# Patient Record
Sex: Male | Born: 1950 | Race: White | Hispanic: No | Marital: Single | Smoking: Former smoker
Health system: Southern US, Community
[De-identification: ages and names within clinical notes are randomized; demographics above are authoritative.]

## PROBLEM LIST (undated history)

## (undated) DIAGNOSIS — J449 Chronic obstructive pulmonary disease, unspecified: Secondary | ICD-10-CM

## (undated) DIAGNOSIS — D499 Neoplasm of unspecified behavior of unspecified site: Secondary | ICD-10-CM

## (undated) DIAGNOSIS — F329 Major depressive disorder, single episode, unspecified: Secondary | ICD-10-CM

## (undated) DIAGNOSIS — J40 Bronchitis, not specified as acute or chronic: Secondary | ICD-10-CM

## (undated) DIAGNOSIS — F1021 Alcohol dependence, in remission: Secondary | ICD-10-CM

## (undated) DIAGNOSIS — K439 Ventral hernia without obstruction or gangrene: Secondary | ICD-10-CM

## (undated) DIAGNOSIS — F419 Anxiety disorder, unspecified: Secondary | ICD-10-CM

## (undated) DIAGNOSIS — Z902 Acquired absence of lung [part of]: Secondary | ICD-10-CM

## (undated) DIAGNOSIS — C801 Malignant (primary) neoplasm, unspecified: Secondary | ICD-10-CM

## (undated) DIAGNOSIS — H3530 Unspecified macular degeneration: Secondary | ICD-10-CM

## (undated) DIAGNOSIS — F32A Depression, unspecified: Secondary | ICD-10-CM

## (undated) DIAGNOSIS — Z9889 Other specified postprocedural states: Secondary | ICD-10-CM

## (undated) DIAGNOSIS — I10 Essential (primary) hypertension: Secondary | ICD-10-CM

## (undated) HISTORY — DX: Bronchitis, not specified as acute or chronic: J40

## (undated) HISTORY — DX: Anxiety disorder, unspecified: F41.9

## (undated) HISTORY — DX: Malignant (primary) neoplasm, unspecified: C80.1

## (undated) HISTORY — DX: Essential (primary) hypertension: I10

## (undated) HISTORY — DX: Acquired absence of lung (part of): Z90.2

## (undated) HISTORY — DX: Major depressive disorder, single episode, unspecified: F32.9

## (undated) HISTORY — DX: Other specified postprocedural states: Z98.890

## (undated) HISTORY — DX: Alcohol dependence, in remission: F10.21

## (undated) HISTORY — DX: Chronic obstructive pulmonary disease, unspecified: J44.9

## (undated) HISTORY — DX: Unspecified macular degeneration: H35.30

## (undated) HISTORY — DX: Ventral hernia without obstruction or gangrene: K43.9

## (undated) HISTORY — DX: Neoplasm of unspecified behavior of unspecified site: D49.9

## (undated) HISTORY — PX: LUNG REMOVAL, PARTIAL: SHX233

## (undated) HISTORY — PX: TONSILLECTOMY: SUR1361

## (undated) HISTORY — PX: NECK SURGERY: SHX720

## (undated) HISTORY — DX: Depression, unspecified: F32.A

---

## 1999-10-15 DIAGNOSIS — G8929 Other chronic pain: Secondary | ICD-10-CM | POA: Insufficient documentation

## 1999-10-15 DIAGNOSIS — F329 Major depressive disorder, single episode, unspecified: Secondary | ICD-10-CM | POA: Insufficient documentation

## 1999-10-15 DIAGNOSIS — I10 Essential (primary) hypertension: Secondary | ICD-10-CM | POA: Insufficient documentation

## 1999-10-15 DIAGNOSIS — F419 Anxiety disorder, unspecified: Secondary | ICD-10-CM | POA: Insufficient documentation

## 1999-10-15 DIAGNOSIS — F4310 Post-traumatic stress disorder, unspecified: Secondary | ICD-10-CM | POA: Insufficient documentation

## 1999-10-15 DIAGNOSIS — Z79891 Long term (current) use of opiate analgesic: Secondary | ICD-10-CM | POA: Insufficient documentation

## 1999-10-15 DIAGNOSIS — Z9981 Dependence on supplemental oxygen: Secondary | ICD-10-CM | POA: Insufficient documentation

## 2006-05-23 DIAGNOSIS — N529 Male erectile dysfunction, unspecified: Secondary | ICD-10-CM | POA: Insufficient documentation

## 2010-10-31 ENCOUNTER — Ambulatory Visit: Payer: Self-pay | Admitting: Pain Medicine

## 2010-11-20 ENCOUNTER — Ambulatory Visit: Payer: Self-pay | Admitting: Pain Medicine

## 2010-12-05 ENCOUNTER — Ambulatory Visit: Payer: Self-pay | Admitting: Pain Medicine

## 2010-12-13 ENCOUNTER — Other Ambulatory Visit: Payer: Self-pay | Admitting: Pain Medicine

## 2010-12-27 ENCOUNTER — Ambulatory Visit: Payer: Self-pay | Admitting: Pain Medicine

## 2011-01-28 ENCOUNTER — Ambulatory Visit: Payer: Self-pay | Admitting: Pain Medicine

## 2011-02-14 ENCOUNTER — Ambulatory Visit: Payer: Self-pay | Admitting: Pain Medicine

## 2011-02-21 ENCOUNTER — Ambulatory Visit: Payer: Self-pay | Admitting: Pain Medicine

## 2011-02-27 ENCOUNTER — Ambulatory Visit: Payer: Self-pay | Admitting: Pain Medicine

## 2011-02-28 ENCOUNTER — Ambulatory Visit: Payer: Self-pay | Admitting: Pain Medicine

## 2011-03-21 ENCOUNTER — Ambulatory Visit: Payer: Self-pay | Admitting: Pain Medicine

## 2011-04-08 ENCOUNTER — Ambulatory Visit: Payer: Self-pay | Admitting: Pain Medicine

## 2011-04-30 ENCOUNTER — Ambulatory Visit: Payer: Self-pay | Admitting: Pain Medicine

## 2011-05-22 DIAGNOSIS — I4891 Unspecified atrial fibrillation: Secondary | ICD-10-CM | POA: Insufficient documentation

## 2011-05-23 ENCOUNTER — Other Ambulatory Visit: Payer: Self-pay | Admitting: Pain Medicine

## 2011-05-27 ENCOUNTER — Ambulatory Visit: Payer: Self-pay | Admitting: Pain Medicine

## 2011-06-24 ENCOUNTER — Ambulatory Visit: Payer: Self-pay | Admitting: Pain Medicine

## 2011-07-04 ENCOUNTER — Ambulatory Visit: Payer: Self-pay | Admitting: Pain Medicine

## 2011-07-31 ENCOUNTER — Ambulatory Visit: Payer: Self-pay | Admitting: Pain Medicine

## 2011-09-11 ENCOUNTER — Ambulatory Visit: Payer: Self-pay | Admitting: Pain Medicine

## 2011-09-18 ENCOUNTER — Other Ambulatory Visit: Payer: Self-pay

## 2011-09-19 ENCOUNTER — Ambulatory Visit: Payer: Self-pay | Admitting: Pain Medicine

## 2011-10-16 ENCOUNTER — Ambulatory Visit: Payer: Self-pay | Admitting: Pain Medicine

## 2011-11-28 ENCOUNTER — Ambulatory Visit: Payer: Self-pay | Admitting: Pain Medicine

## 2011-12-16 ENCOUNTER — Ambulatory Visit: Payer: Self-pay | Admitting: Pain Medicine

## 2012-01-20 ENCOUNTER — Ambulatory Visit: Payer: Self-pay | Admitting: Pain Medicine

## 2012-01-30 ENCOUNTER — Ambulatory Visit: Payer: Self-pay | Admitting: Pain Medicine

## 2012-02-17 ENCOUNTER — Ambulatory Visit: Payer: Self-pay | Admitting: Pain Medicine

## 2012-03-17 ENCOUNTER — Ambulatory Visit: Payer: Self-pay | Admitting: Pain Medicine

## 2012-04-21 ENCOUNTER — Ambulatory Visit: Payer: Self-pay | Admitting: Pain Medicine

## 2012-06-17 ENCOUNTER — Ambulatory Visit: Payer: Self-pay | Admitting: Pain Medicine

## 2012-06-23 ENCOUNTER — Ambulatory Visit: Payer: Self-pay | Admitting: Pain Medicine

## 2012-06-25 ENCOUNTER — Ambulatory Visit: Payer: Self-pay | Admitting: Pain Medicine

## 2012-07-15 ENCOUNTER — Ambulatory Visit: Payer: Self-pay | Admitting: Pain Medicine

## 2012-07-30 ENCOUNTER — Ambulatory Visit: Payer: Self-pay | Admitting: Pain Medicine

## 2012-09-30 ENCOUNTER — Ambulatory Visit: Payer: Self-pay | Admitting: Pain Medicine

## 2012-10-22 ENCOUNTER — Ambulatory Visit: Payer: Self-pay | Admitting: Pain Medicine

## 2012-11-16 ENCOUNTER — Ambulatory Visit: Payer: Self-pay | Admitting: Pain Medicine

## 2012-11-19 ENCOUNTER — Ambulatory Visit: Payer: Self-pay | Admitting: Pain Medicine

## 2013-01-14 ENCOUNTER — Ambulatory Visit: Payer: Self-pay | Admitting: Pain Medicine

## 2013-02-12 ENCOUNTER — Other Ambulatory Visit: Payer: Self-pay | Admitting: Pain Medicine

## 2013-02-12 ENCOUNTER — Ambulatory Visit: Payer: Self-pay | Admitting: Pain Medicine

## 2013-02-12 LAB — MAGNESIUM: Magnesium: 1.7 mg/dL — ABNORMAL LOW

## 2013-02-18 ENCOUNTER — Ambulatory Visit: Payer: Self-pay | Admitting: Pain Medicine

## 2013-02-22 DIAGNOSIS — R0602 Shortness of breath: Secondary | ICD-10-CM | POA: Insufficient documentation

## 2013-02-22 DIAGNOSIS — F319 Bipolar disorder, unspecified: Secondary | ICD-10-CM | POA: Insufficient documentation

## 2013-02-22 DIAGNOSIS — E785 Hyperlipidemia, unspecified: Secondary | ICD-10-CM | POA: Insufficient documentation

## 2013-02-22 DIAGNOSIS — K5730 Diverticulosis of large intestine without perforation or abscess without bleeding: Secondary | ICD-10-CM | POA: Insufficient documentation

## 2013-02-22 DIAGNOSIS — F17200 Nicotine dependence, unspecified, uncomplicated: Secondary | ICD-10-CM | POA: Insufficient documentation

## 2013-02-22 DIAGNOSIS — G8912 Acute post-thoracotomy pain: Secondary | ICD-10-CM | POA: Insufficient documentation

## 2013-02-22 DIAGNOSIS — J441 Chronic obstructive pulmonary disease with (acute) exacerbation: Secondary | ICD-10-CM | POA: Insufficient documentation

## 2013-02-22 DIAGNOSIS — K219 Gastro-esophageal reflux disease without esophagitis: Secondary | ICD-10-CM | POA: Insufficient documentation

## 2013-02-22 DIAGNOSIS — I10 Essential (primary) hypertension: Secondary | ICD-10-CM | POA: Insufficient documentation

## 2013-03-12 ENCOUNTER — Ambulatory Visit: Payer: Self-pay | Admitting: Pain Medicine

## 2013-03-18 ENCOUNTER — Ambulatory Visit: Payer: Self-pay | Admitting: Pain Medicine

## 2013-04-09 ENCOUNTER — Ambulatory Visit: Payer: Self-pay | Admitting: Pain Medicine

## 2013-04-29 ENCOUNTER — Ambulatory Visit: Payer: Self-pay | Admitting: Pain Medicine

## 2013-05-17 ENCOUNTER — Ambulatory Visit: Payer: Self-pay | Admitting: Pain Medicine

## 2013-06-04 ENCOUNTER — Ambulatory Visit: Payer: Self-pay | Admitting: Pain Medicine

## 2013-06-10 ENCOUNTER — Ambulatory Visit: Payer: Self-pay | Admitting: Pain Medicine

## 2013-07-12 ENCOUNTER — Ambulatory Visit: Payer: Self-pay | Admitting: Pain Medicine

## 2013-07-15 ENCOUNTER — Ambulatory Visit: Payer: Self-pay | Admitting: Pain Medicine

## 2013-07-28 DIAGNOSIS — I70209 Unspecified atherosclerosis of native arteries of extremities, unspecified extremity: Secondary | ICD-10-CM | POA: Insufficient documentation

## 2013-07-28 DIAGNOSIS — M5030 Other cervical disc degeneration, unspecified cervical region: Secondary | ICD-10-CM | POA: Insufficient documentation

## 2013-08-05 ENCOUNTER — Ambulatory Visit: Payer: Self-pay | Admitting: Pain Medicine

## 2013-08-13 ENCOUNTER — Ambulatory Visit: Payer: Self-pay | Admitting: Pain Medicine

## 2013-08-19 ENCOUNTER — Ambulatory Visit: Payer: Self-pay | Admitting: Pain Medicine

## 2013-09-06 ENCOUNTER — Ambulatory Visit: Payer: Self-pay | Admitting: Pain Medicine

## 2013-10-22 ENCOUNTER — Ambulatory Visit: Payer: Self-pay | Admitting: Pain Medicine

## 2013-12-23 ENCOUNTER — Ambulatory Visit: Payer: Self-pay | Admitting: Pain Medicine

## 2013-12-29 ENCOUNTER — Other Ambulatory Visit: Payer: Self-pay | Admitting: Pain Medicine

## 2014-01-05 ENCOUNTER — Ambulatory Visit: Payer: Self-pay | Admitting: Pain Medicine

## 2014-01-20 ENCOUNTER — Ambulatory Visit: Payer: Self-pay | Admitting: Pain Medicine

## 2014-02-09 ENCOUNTER — Ambulatory Visit: Payer: Self-pay | Admitting: Pain Medicine

## 2014-02-25 ENCOUNTER — Ambulatory Visit: Payer: Self-pay | Admitting: Pain Medicine

## 2014-03-01 ENCOUNTER — Ambulatory Visit: Payer: Self-pay | Admitting: Pain Medicine

## 2014-03-03 ENCOUNTER — Ambulatory Visit: Payer: Self-pay | Admitting: Pain Medicine

## 2014-03-23 ENCOUNTER — Ambulatory Visit: Payer: Self-pay | Admitting: Pain Medicine

## 2014-04-29 DIAGNOSIS — G629 Polyneuropathy, unspecified: Secondary | ICD-10-CM | POA: Insufficient documentation

## 2014-05-06 ENCOUNTER — Ambulatory Visit: Payer: Self-pay | Admitting: Pain Medicine

## 2014-05-12 ENCOUNTER — Ambulatory Visit: Payer: Self-pay | Admitting: Pain Medicine

## 2014-05-30 ENCOUNTER — Ambulatory Visit: Payer: Self-pay | Admitting: Pain Medicine

## 2014-07-07 ENCOUNTER — Ambulatory Visit: Payer: Self-pay | Admitting: Pain Medicine

## 2014-07-28 ENCOUNTER — Ambulatory Visit: Payer: Self-pay | Admitting: Pain Medicine

## 2014-08-17 ENCOUNTER — Ambulatory Visit: Payer: Self-pay | Admitting: Pain Medicine

## 2015-01-25 DIAGNOSIS — C44211 Basal cell carcinoma of skin of unspecified ear and external auricular canal: Secondary | ICD-10-CM | POA: Insufficient documentation

## 2015-07-20 ENCOUNTER — Encounter: Payer: Self-pay | Admitting: Pain Medicine

## 2015-07-20 ENCOUNTER — Ambulatory Visit: Payer: Medicare Other | Attending: Pain Medicine | Admitting: Pain Medicine

## 2015-07-20 VITALS — BP 156/77 | HR 59 | Temp 97.6°F | Resp 16 | Ht 69.0 in | Wt 162.0 lb

## 2015-07-20 DIAGNOSIS — M47817 Spondylosis without myelopathy or radiculopathy, lumbosacral region: Secondary | ICD-10-CM

## 2015-07-20 DIAGNOSIS — M549 Dorsalgia, unspecified: Secondary | ICD-10-CM | POA: Diagnosis present

## 2015-07-20 DIAGNOSIS — M545 Low back pain: Secondary | ICD-10-CM | POA: Diagnosis not present

## 2015-07-20 DIAGNOSIS — M5489 Other dorsalgia: Secondary | ICD-10-CM | POA: Insufficient documentation

## 2015-07-20 DIAGNOSIS — M47816 Spondylosis without myelopathy or radiculopathy, lumbar region: Secondary | ICD-10-CM

## 2015-07-20 MED ORDER — TRIAMCINOLONE ACETONIDE 40 MG/ML IJ SUSP
INTRAMUSCULAR | Status: AC
  Filled 2015-07-20: qty 1

## 2015-07-20 MED ORDER — TRIAMCINOLONE ACETONIDE 40 MG/ML IJ SUSP
40.0000 mg | Freq: Once | INTRAMUSCULAR | Status: AC
  Administered 2015-07-20: 13:00:00

## 2015-07-20 MED ORDER — ROPIVACAINE HCL 2 MG/ML IJ SOLN
INTRAMUSCULAR | Status: AC
  Filled 2015-07-20: qty 10

## 2015-07-20 MED ORDER — MIDAZOLAM HCL 5 MG/5ML IJ SOLN: 5.0000 mg | Freq: Once | INTRAMUSCULAR | Status: DC

## 2015-07-20 MED ORDER — FENTANYL CITRATE (PF) 100 MCG/2ML IJ SOLN
INTRAMUSCULAR | Status: AC
  Filled 2015-07-20: qty 2

## 2015-07-20 MED ORDER — MIDAZOLAM HCL 5 MG/5ML IJ SOLN
INTRAMUSCULAR | Status: AC
Start: 2015-07-20 — End: 2015-07-21
  Filled 2015-07-20: qty 5

## 2015-07-20 MED ORDER — ROPIVACAINE HCL 2 MG/ML IJ SOLN
9.0000 mL | Freq: Once | INTRAMUSCULAR | Status: AC
  Administered 2015-07-20: 13:00:00

## 2015-07-20 MED ORDER — FENTANYL CITRATE (PF) 100 MCG/2ML IJ SOLN: 100.0000 ug | Freq: Once | INTRAMUSCULAR | Status: DC

## 2015-07-20 NOTE — Patient Instructions (Signed)
Pain Management Discharge Instructions  General Discharge Instructions :  If you need to reach your doctor call: Monday-Friday 8:00 am - 4:00 pm at [phone] or toll free [phone].  After clinic hours [phone] to have operator reach doctor.  Bring all of your medication bottles to all your appointments in the pain clinic.  To cancel or reschedule your appointment with Pain Management please remember to call 24 hours in advance to avoid a fee.  Refer to the educational materials which you have been given on: General Risks, I had my Procedure. Discharge Instructions, Post Sedation.  Post Procedure Instructions:  The drugs you were given will stay in your system until tomorrow, so for the next 24 hours you should not drive, make any legal decisions or drink any alcoholic beverages.  You may eat anything you prefer, but it is better to start with liquids then soups and crackers, and gradually work up to solid foods.  Please notify your doctor immediately if you have any unusual bleeding, trouble breathing or pain that is not related to your normal pain.  Depending on the type of procedure that was done, some parts of your body may feel week and/or numb.  This usually clears up by tonight or the next day.  Walk with the use of an assistive device or accompanied by an adult for the 24 hours.  You may use ice on the affected area for the first 24 hours.  Put ice in a Ziploc bag and cover with a towel and place against area 15 minutes on 15 minutes off.  You may switch to heat after 24 hours.

## 2015-07-20 NOTE — Progress Notes (Signed)
Safety precautions to be maintained throughout the outpatient stay will include: orient to surroundings, keep bed in low position, maintain call bell within reach at all times, provide assistance with transfer out of bed and ambulation.  Oxyocodone 32m 73/90

## 2015-07-21 ENCOUNTER — Telehealth: Payer: Self-pay | Admitting: *Deleted

## 2015-07-21 NOTE — Telephone Encounter (Signed)
Patient verbalizes no complications from procedure.

## 2015-07-23 NOTE — Progress Notes (Signed)
Patient's Name: Rick LIPKIN Sr. MRN: [id number] DOB: 10/11/1951 DOS: 07/20/2015  Primary Reason(s) for Visit: Interventional Pain Management Treatment. CC: Back Pain   Pre-Procedure Assessment: Mr. Feutz is a 64 y.o. year old, male patient, seen today for interventional treatment. He  does not have a problem list on file.. His primarily concern today is the Back Pain Verification of the correct person, correct site (including marking of site), and correct procedure were performed and confirmed by the patient. Today's Vitals   07/20/15 1303 07/20/15 1309 07/20/15 1319 07/20/15 1329  BP: 156/85 113/79 167/87 156/77  Pulse: 65 61 63 59  Temp:      TempSrc:      Resp: _0 Height:      Weight:      SpO2: 95% 100% 99% 99%  PainSc: 2      Calculated BMI: Body mass index is 23.91 kg/(m^2). Allergies: He has No Known Allergies.. Primary Diagnosis: Facet syndrome, lumbar [M54.5]  Procedure: Type: Diagnostic Medial Branch Facet Block Region: Lumbar Level: L2, L3, L4, L5, & S1 Medial Branch Level(s) Laterality: Right  Indications: Spondylosis, Lumbosacral Region Lumbosacral Spine Pain  Consent: Secured. Under the influence of no sedatives a written informed consent was obtained, after having provided information on the risks and possible complications. To fulfill our ethical and legal obligations, as recommended by the American Medical Association's Code of Ethics, we have provided information to the patient about our clinical impression; the nature and purpose of the treatment or procedure; the risks, benefits, and possible complications of the intervention; alternatives; the risk(s) and benefit(s) of the alternative treatment(s) or procedure(s); and the risk(s) and benefit(s) of doing nothing. The patient was provided information about the risks and possible complications associated with the procedure. In the case of spinal procedures these may include, but are not limited to,  failure to achieve desired goals, infection, bleeding, organ or nerve damage, allergic reactions, paralysis, and death. In addition, the patient was informed that Medicine is not an exact science; therefore, there is also the possibility of unforeseen risks and possible complications that may result in a catastrophic outcome. The patient indicated having understood very clearly. We have given the patient no guarantees and we have made no promises. Enough time was given to the patient to ask questions, all of which were answered to the patient's satisfaction.  Pre-Procedure Preparation: Safety Precautions: Allergies reviewed. Appropriate site, procedure, and patient were confirmed by following the Joint Commission's Universal Protocol (UP.01.01.01), in the form of a "Time Out". The patient was asked to confirm marked site and procedure, before commencing. The patient was asked about blood thinners, or active infections, both of which were denied. Patient was assessed for positional comfort and all pressure points were checked before starting procedure. Monitoring:  As per clinic protocol. Infection Control Precautions: Sterile technique used. Standard Universal Precautions were taken as recommended by the Department of [facility] for Disease Control and Prevention (CDC). Standard pre-surgical skin prep was conducted. Respiratory hygiene and cough etiquette was practiced. Hand hygiene observed. Safe injection practices and needle disposal techniques followed. SDV (single dose vial) medications used. Medications properly checked for expiration dates and contaminants. Personal protective equipment (PPE) used: Sterile double glove technique. Radiation resistant gloves. Sterile surgical gloves.  Anesthesia, Analgesia, Anxiolysis: Type: Moderate (Conscious) Sedation & Local Anesthesia. Meaningful verbal contact was maintained, with the patient at all times during the procedure. Local Anesthetic(s):  Lidocaine 1% IV Access: Secured Sedation: Meaningful verbal contact  was maintained at all times during the procedure. Indication(s):Analgesia.  Description of Procedure Process:  Time-out: "Time-out" completed before starting procedure, as per protocol. Position: Prone Target Area: For Lumbar Facet blocks, the target is the groove formed by the junction of the transverse process and superior articular process. For the L5 dorsal ramus, the target is the notch between superior articular process and sacral ala. For the S1 dorsal ramus, the target is the superior and lateral edge of the posterior S1 Sacral foramen. Approach: Paramedial approach. Area Prepped: Entire Posterior Lumbosacral Region Prepping solution: ChloraPrep (2% chlorhexidine gluconate and 70% isopropyl alcohol) Safety Precautions: Aspiration looking for blood return was conducted prior to all injections. At no point did we inject any substances, as a needle was being advanced. No attempts were made at seeking any paresthesias. Safe injection practices and needle disposal techniques used. Medications properly checked for expiration dates. SDV (single dose vial) medications used. Description of the Procedure: Protocol guidelines were followed. The patient was placed in position over the fluoroscopy table. The target area was identified and the area prepped in the usual manner. Skin desensitized using vapocoolant spray. Skin & deeper tissues infiltrated with local anesthetic. Appropriate amount of time allowed to pass for local anesthetics to take effect. The procedure needle was introduced through the skin, ipsilateral to the reported pain, and advanced to the target area. Employing the "Medial Branch Technique", the needles were advanced to the angle made by the superior and medial portion of the transverse process, and the lateral and inferior portion of the superior articulating process of the targeted vertebral bodies. This area is known  as "Burton's Eye" or the "Eye of the [place] Dog". A procedure needle was introduced through the skin, and this time advanced to the angle made by the superior and medial border of the sacral ala, and the lateral border of the S1 vertebral body. This last needle was later repositioned at the superior and lateral border of the posterior S1 foramen. Negative aspiration confirmed. Solution injected in intermittent fashion, asking for systemic symptoms every 0.5cc of injectate. The needles were then removed and the area cleansed, making sure to leave some of the prepping solution back to take advantage of its long term bactericidal properties. EBL: None Materials & Medications Used:  Needle(s) Used: 22g - 3.5" Spinal Needle(s) Medications Administered today: We administered triamcinolone acetonide, ropivacaine (PF) 2 mg/ml (0.2%), ropivacaine (PF) 2 mg/ml (0.2%), and triamcinolone acetonide.Please see chart orders for dosing details.  Imaging Guidance:  Type of Imaging Technique: Fluoroscopy Guidance (Spinal) Indication(s): Assistance in needle guidance and placement for procedures requiring needle placement in or near specific anatomical locations not easily accessible without such assistance. Exposure Time: Please see nurses notes. Contrast: None required. Fluoroscopic Guidance: I was personally present in the fluoroscopy suite, where the patient was placed in position for the procedure, over the fluoroscopy-compatible table. Fluoroscopy was manipulated, using "Tunnel Vision Technique", to obtain the best possible view of the target area, on the affected side. Parallax error was corrected before commencing the procedure. A "direction-depth-direction" technique was used to introduce the needle under continuous pulsed fluoroscopic guidance. Once the target was reached, antero-posterior, oblique, and lateral fluoroscopic projection views were taken to confirm needle placement in all planes. Permanently  recorded images stored by scanning into EMR. Interpretation: Intraoperative imaging interpretation by performing Physician. Adequate needle placement confirmed. Adequate needle placement confirmed in AP, lateral, & Oblique Views. No contrast injected.  Antibiotics:  Type:  Antibiotics Given (last 72 hours)  None    No prophylactic antibiotics indicated. Indication(s): No indications identified.  Post-operative Assessment:  Complications: No immediate post-treatment complications were observed. Post-Procedure Pain Score (NAS11): 2/10 Relevant Post-operative Information:  Disposition: Return to clinic for follow-up evaluation. The patient tolerated the entire procedure well. A repeat set of vitals were taken after the procedure and the patient was kept under observation following institutional policy, for this procedure. Post-procedural neurological assessment was performed, showing return to baseline, prior to discharge. The patient was discharged home, once institutional criteria were met. The patient was provided with post-procedure discharge instructions, including a section on how to identify potential problems. Should any problems arise concerning this procedure, the patient was given instructions to immediately contact us, at any time, without hesitation. In any case, we plan to contact the patient by telephone for a follow-up status report regarding this interventional procedure. Comments:  No additional relevant information.  Primary Care Physician: No primary care provider on file. Location: [facility] Note by: Weronika Birch A. Dossie Arbour, M.D, DABA, DABAPM, DABPM, DABIPP, FIPP  Disclaimer:  Medicine is not an exact science. The only guarantee in medicine is that nothing is guaranteed. It is important to note that the decision to proceed with this intervention was based on the information collected from the patient. The Data and conclusions were drawn from the  patient's questionnaire, the interview, and the physical examination. Because the information was provided in large part by the patient, it cannot be guaranteed that it has not been purposely or unconsciously manipulated. Every effort has been made to obtain as much relevant data as possible for this evaluation. It is important to note that the conclusions that lead to this procedure are derived in large part from the available data. Always take into account that the treatment will also be dependent on availability of resources and existing treatment guidelines, considered by other Pain Management Practitioners as being common knowledge and practice, at the time of the intervention. For Medico-Legal purposes, it is also important to point out that variation in procedural techniques and pharmacological choices are the acceptable norm. The indications, contraindications, technique, and results of the above procedure should only be interpreted and judged by a Board-Certified Interventional Pain Specialist with extensive familiarity and expertise in the same exact procedure and technique. Attempts at providing opinions without similar or greater experience and expertise than that of the treating physician will be considered as inappropriate and unethical, and shall result in a formal complaint to the state medical board and applicable specialty societies.

## 2015-08-03 ENCOUNTER — Ambulatory Visit: Payer: Medicare Other | Attending: Pain Medicine | Admitting: Pain Medicine

## 2015-08-03 ENCOUNTER — Encounter: Payer: Self-pay | Admitting: Pain Medicine

## 2015-08-03 VITALS — BP 140/64 | HR 63 | Temp 98.0°F | Resp 16 | Ht 69.0 in | Wt 162.0 lb

## 2015-08-03 DIAGNOSIS — F17210 Nicotine dependence, cigarettes, uncomplicated: Secondary | ICD-10-CM | POA: Insufficient documentation

## 2015-08-03 DIAGNOSIS — Z79899 Other long term (current) drug therapy: Secondary | ICD-10-CM

## 2015-08-03 DIAGNOSIS — I10 Essential (primary) hypertension: Secondary | ICD-10-CM | POA: Insufficient documentation

## 2015-08-03 DIAGNOSIS — Z720 Tobacco use: Secondary | ICD-10-CM

## 2015-08-03 DIAGNOSIS — M47892 Other spondylosis, cervical region: Secondary | ICD-10-CM | POA: Diagnosis not present

## 2015-08-03 DIAGNOSIS — Z902 Acquired absence of lung [part of]: Secondary | ICD-10-CM

## 2015-08-03 DIAGNOSIS — J449 Chronic obstructive pulmonary disease, unspecified: Secondary | ICD-10-CM | POA: Insufficient documentation

## 2015-08-03 DIAGNOSIS — F329 Major depressive disorder, single episode, unspecified: Secondary | ICD-10-CM | POA: Insufficient documentation

## 2015-08-03 DIAGNOSIS — G8929 Other chronic pain: Secondary | ICD-10-CM | POA: Insufficient documentation

## 2015-08-03 DIAGNOSIS — F419 Anxiety disorder, unspecified: Secondary | ICD-10-CM | POA: Diagnosis not present

## 2015-08-03 DIAGNOSIS — M47812 Spondylosis without myelopathy or radiculopathy, cervical region: Secondary | ICD-10-CM

## 2015-08-03 DIAGNOSIS — M545 Low back pain, unspecified: Secondary | ICD-10-CM

## 2015-08-03 DIAGNOSIS — Z5181 Encounter for therapeutic drug level monitoring: Secondary | ICD-10-CM

## 2015-08-03 DIAGNOSIS — M7918 Myalgia, other site: Secondary | ICD-10-CM

## 2015-08-03 DIAGNOSIS — M961 Postlaminectomy syndrome, not elsewhere classified: Secondary | ICD-10-CM

## 2015-08-03 DIAGNOSIS — F1190 Opioid use, unspecified, uncomplicated: Secondary | ICD-10-CM | POA: Insufficient documentation

## 2015-08-03 DIAGNOSIS — G894 Chronic pain syndrome: Secondary | ICD-10-CM | POA: Diagnosis not present

## 2015-08-03 DIAGNOSIS — M797 Fibromyalgia: Secondary | ICD-10-CM | POA: Diagnosis not present

## 2015-08-03 DIAGNOSIS — K5903 Drug induced constipation: Secondary | ICD-10-CM | POA: Diagnosis not present

## 2015-08-03 DIAGNOSIS — I70209 Unspecified atherosclerosis of native arteries of extremities, unspecified extremity: Secondary | ICD-10-CM

## 2015-08-03 DIAGNOSIS — F1120 Opioid dependence, uncomplicated: Secondary | ICD-10-CM

## 2015-08-03 DIAGNOSIS — M47816 Spondylosis without myelopathy or radiculopathy, lumbar region: Secondary | ICD-10-CM

## 2015-08-03 DIAGNOSIS — F1990 Other psychoactive substance use, unspecified, uncomplicated: Secondary | ICD-10-CM

## 2015-08-03 DIAGNOSIS — F1021 Alcohol dependence, in remission: Secondary | ICD-10-CM

## 2015-08-03 DIAGNOSIS — Z9889 Other specified postprocedural states: Secondary | ICD-10-CM

## 2015-08-03 DIAGNOSIS — T402X5A Adverse effect of other opioids, initial encounter: Secondary | ICD-10-CM

## 2015-08-03 DIAGNOSIS — Z79891 Long term (current) use of opiate analgesic: Secondary | ICD-10-CM

## 2015-08-03 DIAGNOSIS — M549 Dorsalgia, unspecified: Secondary | ICD-10-CM | POA: Diagnosis present

## 2015-08-03 MED ORDER — PREGABALIN 150 MG PO CAPS: 150.0000 mg | ORAL_CAPSULE | Freq: Two times a day (BID) | ORAL | Status: DC

## 2015-08-03 MED ORDER — MAGNESIUM OXIDE 400 MG PO TABS: 400.0000 mg | ORAL_TABLET | Freq: Every day | ORAL | Status: DC

## 2015-08-03 MED ORDER — DOCUSATE SODIUM 100 MG PO CAPS: 100.0000 mg | ORAL_CAPSULE | Freq: Every day | ORAL | Status: DC

## 2015-08-03 MED ORDER — METHOCARBAMOL 750 MG PO TABS: 750.0000 mg | ORAL_TABLET | Freq: Three times a day (TID) | ORAL | Status: DC | PRN

## 2015-08-03 MED ORDER — IBUPROFEN 800 MG PO TABS: 800.0000 mg | ORAL_TABLET | Freq: Three times a day (TID) | ORAL | Status: DC | PRN

## 2015-08-03 MED ORDER — OXYCODONE-ACETAMINOPHEN 5-325 MG PO TABS: 1.0000 | ORAL_TABLET | Freq: Three times a day (TID) | ORAL | Status: DC | PRN

## 2015-08-03 NOTE — Progress Notes (Signed)
Patient's Name: Rick Carter. MRN: [id number] DOB: 1951-10-12 DOS: 08/03/2015  Primary Reason(s) for Visit: Encounter for Medication Management. CC: Back Pain; Leg Pain; and Arm Pain   HPI:   Rick Carter is a 64 y.o. year old, male patient, who returns today as an established patient. 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[place]); Atrial fibrillation ([place]); Bipolar affective disorder ([place]); Chronic obstructive pulmonary disease ([place]); Degeneration of intervertebral disc of cervical region; Colon, diverticulosis; Acid reflux; Essential (primary) hypertension; HLD (hyperlipidemia); Breath shortness; Compulsive tobacco user syndrome; Diverticular disease of large intestine; Post-thoracotomy pain syndrome; Encounter for therapeutic drug level monitoring; Long term current use of opiate analgesic; Long term prescription opiate use; Uncomplicated opioid dependence ([place]); Opiate use; Fibromyalgia; Chronic pain syndrome; Chronic pain; Lumbar facet syndrome (R>L); History of alcoholism ([place]); Chronic low back pain; Lumbar spondylosis; Cervical spondylosis; Failed cervical surgery syndrome; Nicotine dependence; Tobacco abuse; Substance Use Disorder Risk: High; History of right-sided partial pneumonectomy; and Atherosclerotic peripheral vascular disease ([place]) on his problem list.. His primarily concern today is the Back Pain; Leg Pain; and Arm Pain   Rick Carter returns today after having had a right-sided palliative lumbar facet block under fluoroscopic guidance. He is doing well and he refers 80% relief of his low back pain. Today we will take over his Lyrica medication as well.  Pharmacotherapy Review: Side-effects or Adverse reactions: None reported. Effectiveness: Described as relatively effective, allowing for increase in activities of daily living (ADL). Onset of action: Within expected pharmacological parameters. Duration of action: Within normal limits for medication. Peak effect:  Timing and results are as within normal expected parameters. [place] PMP: Compliant with practice rules and regulations. DST: Compliant with practice rules and regulations. Lab work: No new labs ordered by our practice. Treatment compliance: Compliant. Substance Use Disorder (SUD) Risk Level: Low Planned course of action: Continue therapy as is.  Post-Procedure Assessment: Right lumbar facet block. Side-effects or Adverse reactions: No significant issues reported. Sedation: No sedation used during procedure. Ultra-Short Term Relief (Initial 30-60 minutes after procedure):  100% Short-Term Relief (Initial 6 hours after procedure):  100% Long-Term Relief (Initial 2 weeks after procedure):  60% Current Relief (Now):  80% Interpretation of Results: Non-physiological response to therapy. Ultra-short term relief is a normal physiological response to analgesics and anesthetics provided during the procedure. Short-term relief confirms injected site as etiology of pain. Long term relief is possibly due to sympathetic blockade, or the effects of steroids, if administered during procedure. Persistent relief would suggest effective anti-inflammatory effects from steroids. No short or long term benefit would suggest etiology of pain to be in a different location. No long term benefit would suggest etiology to be non-inflammatory, possibly compressive.  Allergies: Rick Carter has No Known Allergies.  Meds: The patient has a current medication list which includes the following prescription(s): aclidinium bromide, albuterol sulfate, aspirin ec, cholecalciferol, diphenhydramine, docusate sodium, fluticasone-salmeterol, hydrochlorothiazide, ibuprofen, magnesium oxide, metformin, methocarbamol, oxycodone-acetaminophen, pantoprazole, phenyleph-cpm-dm-apap, pregabalin, and trazodone. Requested Prescriptions   Signed Prescriptions Disp Refills  . docusate sodium (COLACE) 100 MG capsule 30 capsule 1    Sig: Take 1  capsule (100 mg total) by mouth daily.  Marland Kitchen ibuprofen (ADVIL,MOTRIN) 800 MG tablet 90 tablet 1    Sig: Take 1 tablet (800 mg total) by mouth every 8 (eight) hours as needed for moderate pain.  . magnesium oxide (MAG-OX) 400 MG tablet 30 tablet 1    Sig: Take 1 tablet (400 mg total) by mouth daily.  Marland Kitchen  methocarbamol (ROBAXIN) 750 MG tablet 90 tablet 1    Sig: Take 1 tablet (750 mg total) by mouth every 8 (eight) hours as needed for muscle spasms.  Marland Kitchen oxyCODONE-acetaminophen (PERCOCET/ROXICET) 5-325 MG tablet 90 tablet 0    Sig: Take 1 tablet by mouth every 8 (eight) hours as needed for severe pain.  . pregabalin (LYRICA) 150 MG capsule 60 capsule 1    Sig: Take 1 capsule (150 mg total) by mouth 2 (two) times daily.    ROS: Constitutional: Afebrile, no chills, well hydrated and well nourished Gastrointestinal: negative Musculoskeletal:negative Neurological: negative Behavioral/Psych: negative  PFSH: Medical:  Rick Carter  has a past medical history of Depression; Anxiety; COPD (chronic obstructive pulmonary disease) ([place]); Hypertension; History of alcoholism ([place]) (08/08/2015); and History of pneumonectomy (08/08/2015). Family: family history includes Alcohol abuse in his father; Kidney disease in his mother. Surgical:  has past surgical history that includes Tonsillectomy; Lung removal, partial; and Neck surgery. Tobacco:  reports that he has been smoking Cigarettes.  He has a 51 pack-year smoking history. He does not have any smokeless tobacco history on file. Alcohol:  reports that he does not drink alcohol. Drug:  has no drug history on file.  Physical Exam: Vitals:  Today's Vitals   08/03/15 1029 08/03/15 1030  BP: 140/64   Pulse: 63   Temp: 98 F (36.7 C)   TempSrc: Oral   Resp: 16   Height: _0  (1.753 m)   Weight: 162 lb (73.483 kg)   SpO2: 99%   PainSc: 4  4   PainLoc: Back   Calculated BMI: Body mass index is 23.91 kg/(m^2). General appearance: alert, cooperative,  appears stated age and mild distress Eyes: conjunctivae/corneas clear. PERRL, EOM's intact. Fundi benign. Lungs: No evidence respiratory distress, no audible rales or ronchi and no use of accessory muscles of respiration Neck: no adenopathy, no carotid bruit, no JVD, supple, symmetrical, trachea midline and thyroid not enlarged, symmetric, no tenderness/mass/nodules Back: symmetric, no curvature. ROM normal. No CVA tenderness. Extremities: extremities normal, atraumatic, no cyanosis or edema Pulses: 2+ and symmetric Skin: Skin color, texture, turgor normal. No rashes or lesions Neurologic: Gait: Antalgic    Assessment: Encounter Diagnosis:  Primary Diagnosis: Chronic pain [G89.29]  Plan: Baruc was seen today for back pain, leg pain and arm pain.  Diagnoses and all orders for this visit:  Chronic pain -     methocarbamol (ROBAXIN) 750 MG tablet; Take 1 tablet (750 mg total) by mouth every 8 (eight) hours as needed for muscle spasms. -     oxyCODONE-acetaminophen (PERCOCET/ROXICET) 5-325 MG tablet; Take 1 tablet by mouth every 8 (eight) hours as needed for severe pain.  Facet syndrome, lumbar  Chronic pain syndrome  Fibromyalgia -     pregabalin (LYRICA) 150 MG capsule; Take 1 capsule (150 mg total) by mouth 2 (two) times daily.  Opiate use  Uncomplicated opioid dependence ([place])  Long term prescription opiate use  Long term current use of opiate analgesic  Encounter for therapeutic drug level monitoring  Diffuse myofascial pain syndrome  Chronic low back pain  Therapeutic opioid-induced constipation ([facility])  History of alcoholism ([facility])  Cervical spondylosis  Failed cervical surgery syndrome  Tobacco abuse  Substance Use Disorder Risk: High  History of right-sided partial pneumonectomy  Atherosclerotic peripheral vascular disease ([place])  Other orders -     docusate sodium (COLACE) 100 MG capsule; Take 1 capsule (100 mg total) by mouth daily. -     ibuprofen  (ADVIL,MOTRIN)  800 MG tablet; Take 1 tablet (800 mg total) by mouth every 8 (eight) hours as needed for moderate pain. -     magnesium oxide (MAG-OX) 400 MG tablet; Take 1 tablet (400 mg total) by mouth daily.     There are no Patient Instructions on file for this visit. Medications discontinued today:  Medications Discontinued During This Encounter  Medication Reason  . fentaNYL (SUBLIMAZE) injection 100 mcg   . midazolam (VERSED) 5 MG/5ML injection 5 mg   . docusate sodium (COLACE) 100 MG capsule Reorder  . ibuprofen (ADVIL,MOTRIN) 800 MG tablet Reorder  . magnesium oxide (MAG-OX) 400 MG tablet Reorder  . methocarbamol (ROBAXIN) 750 MG tablet Reorder  . oxyCODONE-acetaminophen (PERCOCET/ROXICET) 5-325 MG tablet Reorder  . pregabalin (LYRICA) 150 MG capsule Reorder   Medications administered today:  Mr. Blasdell had no medications administered during this visit.  Primary Care Physician: No primary care provider on file. Location: [facility] Note by: Neil Errickson A. Dossie Arbour, M.D, DABA, DABAPM, DABPM, DABIPP, FIPP

## 2015-08-03 NOTE — Progress Notes (Signed)
Safety precautions to be maintained throughout the outpatient stay will include: orient to surroundings, keep bed in low position, maintain call bell within reach at all times, provide assistance with transfer out of bed and ambulation.  Oxycodone 5/353m  Count 32 tablets

## 2015-08-08 ENCOUNTER — Encounter: Payer: Self-pay | Admitting: Pain Medicine

## 2015-08-08 DIAGNOSIS — F1990 Other psychoactive substance use, unspecified, uncomplicated: Secondary | ICD-10-CM | POA: Insufficient documentation

## 2015-08-08 DIAGNOSIS — F1021 Alcohol dependence, in remission: Secondary | ICD-10-CM

## 2015-08-08 DIAGNOSIS — Z902 Acquired absence of lung [part of]: Secondary | ICD-10-CM

## 2015-08-08 DIAGNOSIS — F17200 Nicotine dependence, unspecified, uncomplicated: Secondary | ICD-10-CM | POA: Insufficient documentation

## 2015-08-08 DIAGNOSIS — M5441 Lumbago with sciatica, right side: Secondary | ICD-10-CM

## 2015-08-08 DIAGNOSIS — M47816 Spondylosis without myelopathy or radiculopathy, lumbar region: Secondary | ICD-10-CM | POA: Insufficient documentation

## 2015-08-08 DIAGNOSIS — M47812 Spondylosis without myelopathy or radiculopathy, cervical region: Secondary | ICD-10-CM | POA: Insufficient documentation

## 2015-08-08 DIAGNOSIS — M961 Postlaminectomy syndrome, not elsewhere classified: Secondary | ICD-10-CM | POA: Insufficient documentation

## 2015-08-08 DIAGNOSIS — I70209 Unspecified atherosclerosis of native arteries of extremities, unspecified extremity: Secondary | ICD-10-CM | POA: Insufficient documentation

## 2015-08-08 DIAGNOSIS — Z9889 Other specified postprocedural states: Secondary | ICD-10-CM

## 2015-08-08 DIAGNOSIS — G8929 Other chronic pain: Secondary | ICD-10-CM | POA: Insufficient documentation

## 2015-08-08 DIAGNOSIS — Z720 Tobacco use: Secondary | ICD-10-CM | POA: Insufficient documentation

## 2015-08-08 HISTORY — DX: Acquired absence of lung (part of): Z90.2

## 2015-08-08 HISTORY — DX: Other specified postprocedural states: Z98.890

## 2015-08-08 HISTORY — DX: Alcohol dependence, in remission: F10.21

## 2015-08-31 ENCOUNTER — Other Ambulatory Visit: Payer: Self-pay | Admitting: Pain Medicine

## 2015-09-12 ENCOUNTER — Encounter: Payer: Self-pay | Admitting: Pain Medicine

## 2015-09-12 ENCOUNTER — Ambulatory Visit: Payer: Medicare Other | Attending: Pain Medicine | Admitting: Pain Medicine

## 2015-09-12 VITALS — BP 156/87 | HR 64 | Temp 98.0°F | Resp 20 | Ht 69.0 in | Wt 166.0 lb

## 2015-09-12 DIAGNOSIS — M47812 Spondylosis without myelopathy or radiculopathy, cervical region: Secondary | ICD-10-CM

## 2015-09-12 DIAGNOSIS — J449 Chronic obstructive pulmonary disease, unspecified: Secondary | ICD-10-CM | POA: Insufficient documentation

## 2015-09-12 DIAGNOSIS — I10 Essential (primary) hypertension: Secondary | ICD-10-CM | POA: Insufficient documentation

## 2015-09-12 DIAGNOSIS — E785 Hyperlipidemia, unspecified: Secondary | ICD-10-CM | POA: Insufficient documentation

## 2015-09-12 DIAGNOSIS — F319 Bipolar disorder, unspecified: Secondary | ICD-10-CM | POA: Diagnosis not present

## 2015-09-12 DIAGNOSIS — K219 Gastro-esophageal reflux disease without esophagitis: Secondary | ICD-10-CM | POA: Diagnosis not present

## 2015-09-12 DIAGNOSIS — M961 Postlaminectomy syndrome, not elsewhere classified: Secondary | ICD-10-CM

## 2015-09-12 DIAGNOSIS — F17200 Nicotine dependence, unspecified, uncomplicated: Secondary | ICD-10-CM | POA: Diagnosis not present

## 2015-09-12 DIAGNOSIS — M5030 Other cervical disc degeneration, unspecified cervical region: Secondary | ICD-10-CM | POA: Diagnosis not present

## 2015-09-12 DIAGNOSIS — K5790 Diverticulosis of intestine, part unspecified, without perforation or abscess without bleeding: Secondary | ICD-10-CM | POA: Insufficient documentation

## 2015-09-12 DIAGNOSIS — I70209 Unspecified atherosclerosis of native arteries of extremities, unspecified extremity: Secondary | ICD-10-CM | POA: Diagnosis not present

## 2015-09-12 DIAGNOSIS — I4891 Unspecified atrial fibrillation: Secondary | ICD-10-CM | POA: Insufficient documentation

## 2015-09-12 DIAGNOSIS — M47816 Spondylosis without myelopathy or radiculopathy, lumbar region: Secondary | ICD-10-CM | POA: Insufficient documentation

## 2015-09-12 DIAGNOSIS — M797 Fibromyalgia: Secondary | ICD-10-CM | POA: Diagnosis not present

## 2015-09-12 DIAGNOSIS — M542 Cervicalgia: Secondary | ICD-10-CM | POA: Diagnosis present

## 2015-09-12 MED ORDER — SODIUM CHLORIDE 0.9 % IJ SOLN
INTRAMUSCULAR | Status: AC
  Administered 2015-09-12: 11:00:00
  Filled 2015-09-12: qty 10

## 2015-09-12 MED ORDER — ROPIVACAINE HCL 2 MG/ML IJ SOLN
INTRAMUSCULAR | Status: AC
  Administered 2015-09-12: 11:00:00
  Filled 2015-09-12: qty 10

## 2015-09-12 MED ORDER — LIDOCAINE HCL (PF) 1 % IJ SOLN
INTRAMUSCULAR | Status: AC
  Administered 2015-09-12: 11:00:00
  Filled 2015-09-12: qty 5

## 2015-09-12 MED ORDER — DEXAMETHASONE SODIUM PHOSPHATE 10 MG/ML IJ SOLN
10.0000 mg | Freq: Once | INTRAMUSCULAR | Status: DC
Start: 2015-09-12 — End: 2015-10-02

## 2015-09-12 MED ORDER — DEXAMETHASONE SODIUM PHOSPHATE 10 MG/ML IJ SOLN
INTRAMUSCULAR | Status: AC
  Administered 2015-09-12: 11:00:00
  Filled 2015-09-12: qty 1

## 2015-09-12 MED ORDER — LIDOCAINE HCL (PF) 1 % IJ SOLN: 10.0000 mL | Freq: Once | INTRAMUSCULAR | Status: DC

## 2015-09-12 MED ORDER — SODIUM CHLORIDE 0.9 % IJ SOLN: 1.0000 mL | Freq: Once | INTRAMUSCULAR | Status: DC

## 2015-09-12 MED ORDER — ROPIVACAINE HCL 2 MG/ML IJ SOLN: 1.0000 mL | Freq: Once | INTRAMUSCULAR | Status: DC

## 2015-09-12 NOTE — Patient Instructions (Signed)
Smoking Cessation, Tips for Success If you are ready to quit smoking, congratulations! You have chosen to help yourself be healthier. Cigarettes bring nicotine, tar, carbon monoxide, and other irritants into your body. Your lungs, heart, and blood vessels will be able to work better without these poisons. There are many different ways to quit smoking. Nicotine gum, nicotine patches, a nicotine inhaler, or nicotine nasal spray can help with physical craving. Hypnosis, support groups, and medicines help break the habit of smoking. WHAT THINGS CAN I DO TO MAKE QUITTING EASIER?  Here are some tips to help you quit for good:  Pick a date when you will quit smoking completely. Tell all of your friends and family about your plan to quit on that date.  Do not try to slowly cut down on the number of cigarettes you are smoking. Pick a quit date and quit smoking completely starting on that day.  Throw away all cigarettes.   Clean and remove all ashtrays from your home, work, and car.  On a card, write down your reasons for quitting. Carry the card with you and read it when you get the urge to smoke.  Cleanse your body of nicotine. Drink enough water and fluids to keep your urine clear or pale yellow. Do this after quitting to flush the nicotine from your body.  Learn to predict your moods. Do not let a bad situation be your excuse to have a cigarette. Some situations in your life might tempt you into wanting a cigarette.  Never have "just one" cigarette. It leads to wanting another and another. Remind yourself of your decision to quit.  Change habits associated with smoking. If you smoked while driving or when feeling stressed, try other activities to replace smoking. Stand up when drinking your coffee. Brush your teeth after eating. Sit in a different chair when you read the paper. Avoid alcohol while trying to quit, and try to drink fewer caffeinated beverages. Alcohol and caffeine may urge you to  smoke.  Avoid foods and drinks that can trigger a desire to smoke, such as sugary or spicy foods and alcohol.  Ask people who smoke not to smoke around you.  Have something planned to do right after eating or having a cup of coffee. For example, plan to take a walk or exercise.  Try a relaxation exercise to calm you down and decrease your stress. Remember, you may be tense and nervous for the first 2 weeks after you quit, but this will pass.  Find new activities to keep your hands busy. Play with a pen, coin, or rubber band. Doodle or draw things on paper.  Brush your teeth right after eating. This will help cut down on the craving for the taste of tobacco after meals. You can also try mouthwash.   Use oral substitutes in place of cigarettes. Try using lemon drops, carrots, cinnamon sticks, or chewing gum. Keep them handy so they are available when you have the urge to smoke.  When you have the urge to smoke, try deep breathing.  Designate your home as a nonsmoking area.  If you are a heavy smoker, ask your health care provider about a prescription for nicotine chewing gum. It can ease your withdrawal from nicotine.  Reward yourself. Set aside the cigarette money you save and buy yourself something nice.  Look for support from others. Join a support group or smoking cessation program. Ask someone at home or at work to help you with your plan   to quit smoking.  Always ask yourself, "Do I need this cigarette or is this just a reflex?" Tell yourself, "Today, I choose not to smoke," or "I do not want to smoke." You are reminding yourself of your decision to quit.  Do not replace cigarette smoking with electronic cigarettes (commonly called e-cigarettes). The safety of e-cigarettes is unknown, and some may contain harmful chemicals.  If you relapse, do not give up! Plan ahead and think about what you will do the next time you get the urge to smoke. HOW WILL I FEEL WHEN I QUIT SMOKING? You  may have symptoms of withdrawal because your body is used to nicotine (the addictive substance in cigarettes). You may crave cigarettes, be irritable, feel very hungry, cough often, get headaches, or have difficulty concentrating. The withdrawal symptoms are only temporary. They are strongest when you first quit but will go away within 10-14 days. When withdrawal symptoms occur, stay in control. Think about your reasons for quitting. Remind yourself that these are signs that your body is healing and getting used to being without cigarettes. Remember that withdrawal symptoms are easier to treat than the major diseases that smoking can cause.  Even after the withdrawal is over, expect periodic urges to smoke. However, these cravings are generally short lived and will go away whether you smoke or not. Do not smoke! WHAT RESOURCES ARE AVAILABLE TO HELP ME QUIT SMOKING? Your health care provider can direct you to community resources or hospitals for support, which may include:  Group support.  Education.  Hypnosis.  Therapy.   This information is not intended to replace advice given to you by your health care provider. Make sure you discuss any questions you have with your health care provider.   Document Released: 06/28/2004 Document Revised: 10/21/2014 Document Reviewed: 03/18/2013 Elsevier Interactive Patient Education [employer].

## 2015-09-12 NOTE — Progress Notes (Signed)
Safety precautions to be maintained throughout the outpatient stay will include: orient to surroundings, keep bed in low position, maintain call bell within reach at all times, provide assistance with transfer out of bed and ambulation.

## 2015-09-12 NOTE — Progress Notes (Signed)
Patient's Name: Rick KRETSCHMER Sr. MRN: [id number] DOB: 10-Jul-1951 DOS: 09/12/2015  Primary Reason(s) for Visit: Interventional Pain Management Treatment. CC: Neck Pain   Pre-Procedure Assessment: Rick Carter is a 64 y.o. year old, male patient, seen today for interventional treatment. 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[place]); Atrial fibrillation ([place]); Bipolar affective disorder ([place]); Chronic obstructive pulmonary disease ([place]); Degeneration of intervertebral disc of cervical region; Colon, diverticulosis; Acid reflux; Essential (primary) hypertension; HLD (hyperlipidemia); Breath shortness; Compulsive tobacco user syndrome; Diverticular disease of large intestine; Post-thoracotomy pain syndrome; Encounter for therapeutic drug level monitoring; Long term current use of opiate analgesic; Long term prescription opiate use; Uncomplicated opioid dependence ([place]); Opiate use; Fibromyalgia; Chronic pain syndrome; Chronic pain; Lumbar facet syndrome (R>L); History of alcoholism ([place]); Chronic low back pain; Lumbar spondylosis; Cervical spondylosis; Failed cervical surgery syndrome; Nicotine dependence; Tobacco abuse; Substance Use Disorder Risk: High; History of right-sided partial pneumonectomy; and Atherosclerotic peripheral vascular disease ([place]) on his problem list.. His primarily concern today is the Neck Pain   Verification of the correct person, correct site (including marking of site), and correct procedure were performed and confirmed by the patient.  The patient indicates that his trigger finger is returning and he would like it injected again. Several months ago we injected it and it went away for several months. He would like to try this again when he returns for the follow-up evaluation from this procedure.  Today's Vitals   09/12/15 1035 09/12/15 1045 09/12/15 1050 09/12/15 1055  BP: 151/77 156/82 157/84 156/87  Pulse: 68 51 62 64  Temp:      Resp: _0 Height:       Weight:      SpO2: 98% 94% 98% 98%  PainSc:    0-No pain  PainLoc:      Calculated BMI: Body mass index is 24.5 kg/(m^2). Allergies: He has No Known Allergies.. Primary Diagnosis: Degeneration of intervertebral disc of cervical region [M50.30]  Procedure: Type: Diagnostic Inter-Laminar Epidural Steroid Injection Region: Posterior Cervical Level: C7-T1  Laterality: Left-Sided Paramedial  Indications: Spondylosis, Cervical Region  Consent: Secured. Under the influence of no sedatives a written informed consent was obtained, after having provided information on the risks and possible complications. To fulfill our ethical and legal obligations, as recommended by the American Medical Association's Code of Ethics, we have provided information to the patient about our clinical impression; the nature and purpose of the treatment or procedure; the risks, benefits, and possible complications of the intervention; alternatives; the risk(s) and benefit(s) of the alternative treatment(s) or procedure(s); and the risk(s) and benefit(s) of doing nothing. The patient was provided information about the risks and possible complications associated with the procedure. In the case of spinal procedures these may include, but are not limited to, failure to achieve desired goals, infection, bleeding, organ or nerve damage, allergic reactions, paralysis, and death. In addition, the patient was informed that Medicine is not an exact science; therefore, there is also the possibility of unforeseen risks and possible complications that may result in a catastrophic outcome. The patient indicated having understood very clearly. We have given the patient no guarantees and we have made no promises. Enough time was given to the patient to ask questions, all of which were answered to the patient's satisfaction.  Pre-Procedure Preparation: Safety Precautions: Allergies reviewed. Appropriate site, procedure, and patient were  confirmed by following the Joint Commission's Universal Protocol (UP.01.01.01), in the form of a "Time Out". The patient  was asked to confirm marked site and procedure, before commencing. The patient was asked about blood thinners, or active infections, both of which were denied. Patient was assessed for positional comfort and all pressure points were checked before starting procedure. Monitoring:  As per clinic protocol. Infection Control Precautions: Sterile technique used. Standard Universal Precautions were taken as recommended by the Department of [facility] for Disease Control and Prevention (CDC). Standard pre-surgical skin prep was conducted. Respiratory hygiene and cough etiquette was practiced. Hand hygiene observed. Safe injection practices and needle disposal techniques followed. SDV (single dose vial) medications used. Medications properly checked for expiration dates and contaminants. Personal protective equipment (PPE) used: Surgical mask. Sterile double glove technique. Radiation resistant gloves. Sterile surgical gloves.  Anesthesia, Analgesia, Anxiolysis: Type: Local Anesthesia Local Anesthetic: Lidocaine 1% Route: Subcutaneous IV Access: Declined.", Sedation: Declined.  Indication(s):Not applicable.  Description of Procedure Process:  Time-out: "Time-out" completed before starting procedure, as per protocol. Position: Prone with head of the table was raised to facilitate breathing. Target Area: For Epidural Steroid injections the target is the interlaminar space, initially targeting the lower border of the superior vertebral body lamina. Approach: Paramedial approach. Area Prepped: Entire PosteriorCervical Region Prepping solution: ChloraPrep (2% chlorhexidine gluconate and 70% isopropyl alcohol) Safety Precautions: Aspiration looking for blood return was conducted prior to all injections. At no point did we inject any substances, as a needle was being advanced. No  attempts were made at seeking any paresthesias. Safe injection practices and needle disposal techniques used. Medications properly checked for expiration dates. SDV (single dose vial) medications used. Description of the Procedure: Protocol guidelines were followed. The procedure needle was introduced through the skin, ipsilateral to the reported pain, and advanced to the target area. Bone was contacted and the needle walked caudad, until the lamina was cleared. The epidural space was identified using "loss-of-resistance technique" with 2-3 ml of PF-NaCl (0.9% NSS), in a 5cc LOR glass syringe. EBL: None Materials & Medications Used:  Needle(s) Used: 20g - 10cm, Tuohy-style epidural needle Medications Administered today: We administered ropivacaine (PF) 2 mg/ml (0.2%), lidocaine (PF), sodium chloride, and dexamethasone.Please see chart orders for dosing details.  Imaging Guidance:  Type of Imaging Technique: Fluoroscopy Guidance (Spinal) Indication(s): Assistance in needle guidance and placement for procedures requiring needle placement in or near specific anatomical locations not easily accessible without such assistance. Exposure Time: Please see chart for details. Contrast: None used. Fluoroscopic Guidance: I was personally present in the fluoroscopy suite, where the patient was placed in position for the procedure, over the fluoroscopy-compatible table. Fluoroscopy was manipulated, using "Tunnel Vision Technique", to obtain the best possible view of the target area, on the affected side. Parallax error was corrected before commencing the procedure. A "direction-depth-direction" technique was used to introduce the needle under continuous pulsed fluoroscopic guidance. Once the target was reached, antero-posterior, oblique, and lateral fluoroscopic projection views were taken to confirm needle placement in all planes. Permanently recorded images stored by scanning into EMR. Interpretation:  Intraoperative imaging interpretation by performing Physician. Adequate needle placement confirmed. No contrast injected. Permanent hardcopy images in multiple planes scanned into the patient's record.  Antibiotics:  Type:  Antibiotics Given (last 72 hours)    None      Indication(s): No indications identified.  Post-operative Assessment:  Complications: No immediate post-treatment complications were observed. Disposition: Return to clinic for follow-up evaluation. The patient tolerated the entire procedure well. A repeat set of vitals were taken after the procedure and the patient was kept  under observation following institutional policy, for this procedure. Post-procedural neurological assessment was performed, showing return to baseline, prior to discharge. The patient was discharged home, once institutional criteria were met. The patient was provided with post-procedure discharge instructions, including a section on how to identify potential problems. Should any problems arise concerning this procedure, the patient was given instructions to immediately contact us, at any time, without hesitation. In any case, we plan to contact the patient by telephone for a follow-up status report regarding this interventional procedure. Comments:  No additional relevant information.  Primary Care Physician: No primary care provider on file. Location: [facility] Note by: Erlin Gardella A. Dossie Arbour, M.D, DABA, DABAPM, DABPM, DABIPP, FIPP  Disclaimer:  Medicine is not an exact science. The only guarantee in medicine is that nothing is guaranteed. It is important to note that the decision to proceed with this intervention was based on the information collected from the patient. The Data and conclusions were drawn from the patient's questionnaire, the interview, and the physical examination. Because the information was provided in large part by the patient, it cannot be guaranteed that it  has not been purposely or unconsciously manipulated. Every effort has been made to obtain as much relevant data as possible for this evaluation. It is important to note that the conclusions that lead to this procedure are derived in large part from the available data. Always take into account that the treatment will also be dependent on availability of resources and existing treatment guidelines, considered by other Pain Management Practitioners as being common knowledge and practice, at the time of the intervention. For Medico-Legal purposes, it is also important to point out that variation in procedural techniques and pharmacological choices are the acceptable norm. The indications, contraindications, technique, and results of the above procedure should only be interpreted and judged by a Board-Certified Interventional Pain Specialist with extensive familiarity and expertise in the same exact procedure and technique. Attempts at providing opinions without similar or greater experience and expertise than that of the treating physician will be considered as inappropriate and unethical, and shall result in a formal complaint to the state medical board and applicable specialty societies.

## 2015-09-13 ENCOUNTER — Telehealth: Payer: Self-pay | Admitting: *Deleted

## 2015-09-13 NOTE — Telephone Encounter (Signed)
No problems post procedure phone call.

## 2015-10-02 ENCOUNTER — Encounter: Payer: Self-pay | Admitting: Pain Medicine

## 2015-10-02 ENCOUNTER — Other Ambulatory Visit: Payer: Self-pay | Admitting: Pain Medicine

## 2015-10-02 ENCOUNTER — Ambulatory Visit: Payer: Medicare Other | Attending: Pain Medicine | Admitting: Pain Medicine

## 2015-10-02 VITALS — BP 133/70 | HR 62 | Temp 98.5°F | Resp 18 | Ht 69.0 in | Wt 166.0 lb

## 2015-10-02 DIAGNOSIS — M542 Cervicalgia: Secondary | ICD-10-CM | POA: Insufficient documentation

## 2015-10-02 DIAGNOSIS — M5410 Radiculopathy, site unspecified: Secondary | ICD-10-CM

## 2015-10-02 DIAGNOSIS — E785 Hyperlipidemia, unspecified: Secondary | ICD-10-CM | POA: Insufficient documentation

## 2015-10-02 DIAGNOSIS — M7918 Myalgia, other site: Secondary | ICD-10-CM | POA: Insufficient documentation

## 2015-10-02 DIAGNOSIS — Z79899 Other long term (current) drug therapy: Secondary | ICD-10-CM

## 2015-10-02 DIAGNOSIS — Z79891 Long term (current) use of opiate analgesic: Secondary | ICD-10-CM

## 2015-10-02 DIAGNOSIS — J449 Chronic obstructive pulmonary disease, unspecified: Secondary | ICD-10-CM | POA: Insufficient documentation

## 2015-10-02 DIAGNOSIS — M791 Myalgia: Secondary | ICD-10-CM

## 2015-10-02 DIAGNOSIS — M792 Neuralgia and neuritis, unspecified: Secondary | ICD-10-CM

## 2015-10-02 DIAGNOSIS — G8929 Other chronic pain: Secondary | ICD-10-CM | POA: Insufficient documentation

## 2015-10-02 DIAGNOSIS — M797 Fibromyalgia: Secondary | ICD-10-CM | POA: Diagnosis not present

## 2015-10-02 DIAGNOSIS — F1190 Opioid use, unspecified, uncomplicated: Secondary | ICD-10-CM | POA: Insufficient documentation

## 2015-10-02 DIAGNOSIS — T3991XA Poisoning by unspecified nonopioid analgesic, antipyretic and antirheumatic, accidental (unintentional), initial encounter: Secondary | ICD-10-CM

## 2015-10-02 DIAGNOSIS — M65331 Trigger finger, right middle finger: Secondary | ICD-10-CM | POA: Diagnosis not present

## 2015-10-02 DIAGNOSIS — M6530 Trigger finger, unspecified finger: Secondary | ICD-10-CM

## 2015-10-02 DIAGNOSIS — K2960 Other gastritis without bleeding: Secondary | ICD-10-CM | POA: Insufficient documentation

## 2015-10-02 DIAGNOSIS — I4891 Unspecified atrial fibrillation: Secondary | ICD-10-CM | POA: Diagnosis not present

## 2015-10-02 DIAGNOSIS — M1990 Unspecified osteoarthritis, unspecified site: Secondary | ICD-10-CM | POA: Diagnosis not present

## 2015-10-02 DIAGNOSIS — I2510 Atherosclerotic heart disease of native coronary artery without angina pectoris: Secondary | ICD-10-CM | POA: Insufficient documentation

## 2015-10-02 DIAGNOSIS — I10 Essential (primary) hypertension: Secondary | ICD-10-CM | POA: Diagnosis not present

## 2015-10-02 DIAGNOSIS — F319 Bipolar disorder, unspecified: Secondary | ICD-10-CM | POA: Insufficient documentation

## 2015-10-02 DIAGNOSIS — M961 Postlaminectomy syndrome, not elsewhere classified: Secondary | ICD-10-CM

## 2015-10-02 DIAGNOSIS — M5414 Radiculopathy, thoracic region: Secondary | ICD-10-CM

## 2015-10-02 DIAGNOSIS — M47816 Spondylosis without myelopathy or radiculopathy, lumbar region: Secondary | ICD-10-CM | POA: Insufficient documentation

## 2015-10-02 DIAGNOSIS — F17210 Nicotine dependence, cigarettes, uncomplicated: Secondary | ICD-10-CM | POA: Diagnosis not present

## 2015-10-02 DIAGNOSIS — Z5181 Encounter for therapeutic drug level monitoring: Secondary | ICD-10-CM

## 2015-10-02 DIAGNOSIS — T39395A Adverse effect of other nonsteroidal anti-inflammatory drugs [NSAID], initial encounter: Secondary | ICD-10-CM

## 2015-10-02 DIAGNOSIS — M150 Primary generalized (osteo)arthritis: Secondary | ICD-10-CM

## 2015-10-02 DIAGNOSIS — K5903 Drug induced constipation: Secondary | ICD-10-CM | POA: Insufficient documentation

## 2015-10-02 DIAGNOSIS — M546 Pain in thoracic spine: Secondary | ICD-10-CM

## 2015-10-02 DIAGNOSIS — M545 Low back pain: Secondary | ICD-10-CM

## 2015-10-02 DIAGNOSIS — T402X5A Adverse effect of other opioids, initial encounter: Secondary | ICD-10-CM

## 2015-10-02 DIAGNOSIS — M159 Polyosteoarthritis, unspecified: Secondary | ICD-10-CM

## 2015-10-02 MED ORDER — PREGABALIN 150 MG PO CAPS: 150.0000 mg | ORAL_CAPSULE | Freq: Two times a day (BID) | ORAL | Status: DC

## 2015-10-02 MED ORDER — METHOCARBAMOL 750 MG PO TABS: 750.0000 mg | ORAL_TABLET | Freq: Three times a day (TID) | ORAL | Status: DC | PRN

## 2015-10-02 MED ORDER — OXYCODONE-ACETAMINOPHEN 5-325 MG PO TABS: 1.0000 | ORAL_TABLET | Freq: Four times a day (QID) | ORAL | Status: DC | PRN

## 2015-10-02 MED ORDER — PANTOPRAZOLE SODIUM 40 MG PO TBEC: 40.0000 mg | DELAYED_RELEASE_TABLET | Freq: Two times a day (BID) | ORAL | Status: DC

## 2015-10-02 MED ORDER — ROPIVACAINE HCL 2 MG/ML IJ SOLN: 3.0000 mL | Freq: Once | INTRAMUSCULAR | Status: DC

## 2015-10-02 MED ORDER — DOCUSATE SODIUM 100 MG PO CAPS: 100.0000 mg | ORAL_CAPSULE | Freq: Every day | ORAL | Status: DC

## 2015-10-02 MED ORDER — TRIAMCINOLONE ACETONIDE 40 MG/ML IJ SUSP: 40.0000 mg | Freq: Once | INTRAMUSCULAR | Status: DC

## 2015-10-02 MED ORDER — IBUPROFEN 800 MG PO TABS: 800.0000 mg | ORAL_TABLET | Freq: Three times a day (TID) | ORAL | Status: DC | PRN

## 2015-10-02 MED ORDER — ROPIVACAINE HCL 2 MG/ML IJ SOLN
INTRAMUSCULAR | Status: AC
  Filled 2015-10-02: qty 10

## 2015-10-02 MED ORDER — TRIAMCINOLONE ACETONIDE 40 MG/ML IJ SUSP
INTRAMUSCULAR | Status: AC
  Filled 2015-10-02: qty 1

## 2015-10-02 MED ORDER — MAGNESIUM OXIDE 400 MG PO TABS: 400.0000 mg | ORAL_TABLET | Freq: Every day | ORAL | Status: DC

## 2015-10-02 NOTE — Patient Instructions (Addendum)
Epidural Steroid Injection Patient Information  Description: The epidural space surrounds the nerves as they exit the spinal cord.  In some patients, the nerves can be compressed and inflamed by a bulging disc or a tight spinal canal (spinal stenosis).  By injecting steroids into the epidural space, we can bring irritated nerves into direct contact with a potentially helpful medication.  These steroids act directly on the irritated nerves and can reduce swelling and inflammation which often leads to decreased pain.  Epidural steroids may be injected anywhere along the spine and from the neck to the low back depending upon the location of your pain.   After numbing the skin with local anesthetic (like Novocaine), a small needle is passed into the epidural space slowly.  You may experience a sensation of pressure while this is being done.  The entire block usually last less than 10 minutes.  Conditions which may be treated by epidural steroids:   Low back and leg pain  Neck and arm pain  Spinal stenosis  Post-laminectomy syndrome  Herpes zoster (shingles) pain  Pain from compression fractures  Preparation for the injection:   Do not eat any solid food or dairy products within 6 hours of your appointment.   You may drink clear liquids up to 2 hours before appointment.  Clear liquids include water, black coffee, juice or soda.  No milk or cream please.  You may take your regular medication, including pain medications, with a sip of water before your appointment  Diabetics should hold regular insulin (if taken separately) and take 1/2 normal NPH dos the morning of the procedure.  Carry some sugar containing items with you to your appointment.  A driver must accompany you and be prepared to drive you home after your procedure.   Bring all your current medications with your.  An IV may be inserted and sedation may be given at the discretion of the physician.    A blood pressure cuff, EKG  and other monitors will often be applied during the procedure.  Some patients may need to have extra oxygen administered for a short period.  You will be asked to provide medical information, including your allergies, prior to the procedure.  We must know immediately if you are taking blood thinners (like Coumadin/Warfarin)  Or if you are allergic to IV iodine contrast (dye). We must know if you could possible be pregnant.  Possible side-effects:  Bleeding from needle site  Infection (rare, may require surgery)  Nerve injury (rare)  Numbness & tingling (temporary)  Difficulty urinating (rare, temporary)  Spinal headache ( a headache worse with upright posture)  Light -headedness (temporary)  Pain at injection site (several days)  Decreased blood pressure (temporary)  Weakness in arm/leg (temporary)  Pressure sensation in back/neck (temporary)  Call if you experience:  Fever/chills associated with headache or increased back/neck pain.  Headache worsened by an upright position.  New onset weakness or numbness of an extremity below the injection site  Hives or difficulty breathing (go to the emergency room)  Inflammation or drainage at the infection site  Severe back/neck pain  Any new symptoms which are concerning to you  Please note:  Although the local anesthetic injected can often make your back or neck feel good for several hours after the injection, the pain will likely return.  It takes 3-7 days for steroids to work in the epidural space.  You may not notice any pain relief for at least that one week.  If effective, we will often do a series of three injections spaced 3-6 weeks apart to maximally decrease your pain.  After the initial series, we generally will wait several months before considering a repeat injection of the same type.  If you have any questions, please call [phone] [facility]  Facet Joint Block The  facet joints connect the bones of the spine (vertebrae). They make it possible for you to bend, twist, and make other movements with your spine. They also prevent you from overbending, overtwisting, and making other excessive movements.  A facet joint block is a procedure where a numbing medicine (anesthetic) is injected into a facet joint. Often, a type of anti-inflammatory medicine called a steroid is also injected. A facet joint block may be done for two reasons:   Diagnosis. A facet joint block may be done as a test to see whether neck or back pain is caused by a worn-down or infected facet joint. If the pain gets better after a facet joint block, it means the pain is probably coming from the facet joint. If the pain does not get better, it means the pain is probably not coming from the facet joint.   Therapy. A facet joint block may be done to relieve neck or back pain caused by a facet joint. A facet joint block is only done as a therapy if the pain does not improve with medicine, exercise programs, physical therapy, and other forms of pain management. LET [facility] CARE PROVIDER KNOW ABOUT:   Any allergies you have.   All medicines you are taking, including vitamins, herbs, eyedrops, and over-the-counter medicines and creams.   Previous problems you or members of your family have had with the use of anesthetics.   Any blood disorders you have had.   Other health problems you have. RISKS AND COMPLICATIONS Generally, having a facet joint block is safe. However, as with any procedure, complications can occur. Possible complications associated with having a facet joint block include:   Bleeding.   Injury to a nerve near the injection site.   Pain at the injection site.   Weakness or numbness in areas controlled by nerves near the injection site.   Infection.   Temporary fluid retention.   Allergic reaction to anesthetics or medicines used during the procedure. BEFORE  THE PROCEDURE   Follow your health care provider's instructions if you are taking dietary supplements or medicines. You may need to stop taking them or reduce your dosage.   Do not take any new dietary supplements or medicines without asking your health care provider first.   Follow your health care provider's instructions about eating and drinking before the procedure. You may need to stop eating and drinking several hours before the procedure.   Arrange to have an adult drive you home after the procedure. PROCEDURE  You may need to remove your clothing and dress in an open-back gown so that your health care provider can access your spine.   The procedure will be done while you are lying on an X-ray table. Most of the time you will be asked to lie on your stomach, but you may be asked to lie in a different position if an injection will be made in your neck.   Special machines will be used to monitor your oxygen levels, heart rate, and blood pressure.   If an injection will be made in your neck, an intravenous (IV) tube will be inserted  into one of your veins. Fluids and medicine will flow directly into your body through the IV tube.   The area over the facet joint where the injection will be made will be cleaned with an antiseptic soap. The surrounding skin will be covered with sterile drapes.   An anesthetic will be applied to your skin to make the injection area numb. You may feel a temporary stinging or burning sensation.   A video X-ray machine will be used to locate the joint. A contrast dye may be injected into the facet joint area to help with locating the joint.   When the joint is located, an anesthetic medicine will be injected into the joint through the needle.   Your health care provider will ask you whether you feel pain relief. If you do feel relief, a steroid may be injected to provide pain relief for a longer period of time. If you do not feel relief or feel  only partial relief, additional injections of an anesthetic may be made in other facet joints.   The needle will be removed, the skin will be cleansed, and bandages will be applied.  AFTER THE PROCEDURE   You will be observed for 15-30 minutes before being allowed to go home. Do not drive. Have an adult drive you or take a taxi or public transportation instead.   If you feel pain relief, the pain will return in several hours or days when the anesthetic wears off.   You may feel pain relief 2-14 days after the procedure. The amount of time this relief lasts varies from person to person.   It is normal to feel some tenderness over the injected area(s) for 2 days following the procedure.   If you have diabetes, you may have a temporary increase in blood sugar.   This information is not intended to replace advice given to you by your health care provider. Make sure you discuss any questions you have with your health care provider.   Document Released: 02/19/2007 Document Revised: 10/21/2014 Document Reviewed: 07/20/2012 Elsevier Interactive Patient Education 2016 [place] prescription for Lyrica and Oxycodone x3 was given to you today.  A prescription for COLACE, IBUPROFEN, MAGNESIUM, METHOCARBIMOL, PROTONIX was sent to your pharmacy and should be available for pickup today.

## 2015-10-02 NOTE — Progress Notes (Signed)
Patient's Name: Rick SALLADE Sr. MRN: [id number] DOB: 1951/04/04 DOS: 10/02/2015  Primary Reason(s) for Visit: Post-Procedure evaluation and medication management. CC: Neck Pain   HPI:    Rick Carter is a 64 y.o. year old, male patient, who returns today as an established patient. 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[place]); Atrial fibrillation ([place]); Bipolar affective disorder ([place]); Chronic obstructive pulmonary disease ([place]); Degeneration of intervertebral disc of cervical region; Colon, diverticulosis; Acid reflux; Essential (primary) hypertension; HLD (hyperlipidemia); Breath shortness; Compulsive tobacco user syndrome; Diverticular disease of large intestine; Post-thoracotomy pain syndrome; Encounter for therapeutic drug level monitoring; Long term current use of opiate analgesic; Long term prescription opiate use; Uncomplicated opioid dependence ([place]); Opiate use; Fibromyalgia; Chronic pain syndrome; Chronic pain; Lumbar facet syndrome (R>L); History of alcoholism ([place]); Chronic low back pain; Lumbar spondylosis; Cervical spondylosis; Failed cervical surgery syndrome; Nicotine dependence; Tobacco abuse; Substance Use Disorder Risk: High; History of right-sided partial pneumonectomy; Atherosclerotic peripheral vascular disease ([place]); Musculoskeletal pain; Myofascial pain; Neurogenic pain; Neuropathic pain; Opioid-induced constipation (OIC); Osteoarthritis; NSAID induced gastritis; and Trigger finger of right hand (middle finger) on his problem list.. His primarily concern today is the Neck Pain     The patient returns to the clinic today after a left cervical epidural steroid injection under fluoroscopic guidance. He indicates not having obtained any significant relief from the cervical epidural steroid injection. Patient does have a failed cervical surgery syndrome. In addition, the patient indicates having a recurrence of his left hand trigger finger which we had injected in  February of this year with 100% relief of the pain until now. We'll repeat this injection today.  Today's Pain Score: 6  Reported level of pain is incompatible with clinical obrservations. This may be secondary to a possible lack of understanding on how the pain scale works. Pain Type: Chronic pain Pain Location: Neck Pain Descriptors / Indicators: Nagging, Sharp Pain Frequency: Constant  Date of Last Visit: 09/12/15 Service Provided on Last Visit: Procedure (CESI)  Pharmacotherapy Review:   Side-effects or Adverse reactions: None reported Effectiveness: Described as relatively effective, allowing for increase in activities of daily living (ADL) Onset of action: Within expected pharmacological parameters Duration of action: Within normal limits for medication Peak effect: Timing and results are as within normal expected parameters [place] PMP: Compliant with practice rules and regulations UDS Results:   none available at this time. UDS Interpretation: Patient appears to be compliant with practice rules and regulations Medication Assessment Form: Reviewed. Patient indicates being compliant with therapy Treatment compliance: Compliant Substance Use Disorder (SUD) Risk Level: Moderate. Over time he has been following the rules and therefore we have slowly being given him more responsibilities. Pharmacologic Plan: Continue therapy as is  Lab Work: Inflammation Markers No results found for: ESRSEDRATE, CRP  Renal Function No results found for: BUN, CREATININE, GFRAA, GFRNONAA  Hepatic Function No results found for: AST, ALT, ALBUMIN  Electrolytes No results found for: NA, K, CL, CALCIUM, MG  Illicit Drugs No results found for: THCU, COCAINSCRNUR, PCPSCRNUR, MDMA, AMPHETMU, METHADONE, ETOH   Procedure Assessment:   Procedure done on last visit: Left cervical epidural steroid injection under fluoroscopic guidance. No sedation. Side-effects or Adverse reactions: None  reported Sedation: No sedation used during procedure  Results: Ultra-Short Term Relief (First 1 hour after procedure): 100 %  Ultra-short term relief is a normal physiological response to analgesics and anesthetics provided during the procedure Short Term Relief (Initial 4-6 hrs after procedure): 50 % Short-term relief confirms  injected site as etiology of pain Long Term Relief : 0 % Long term relief is possibly due to sympathetic blockade, or the anti-inflammatory effects of steroids, if administered during procedure Current Relief (Now):  0%  No long-term benefit suggesting no anti-inflammatory response to the injection. Interpretation of Results: In the past he has obtained good relief with these injections and therefore we'll continue to offer them on a when necessary basis.  Allergies:  Rick Carter has No Known Allergies.  Meds:  The patient has a current medication list which includes the following prescription(s): aclidinium bromide, albuterol sulfate, aspirin ec, cholecalciferol, diphenhydramine, docusate sodium, fluticasone-salmeterol, hydrochlorothiazide, ibuprofen, magnesium oxide, metformin, methocarbamol, oxycodone-acetaminophen, oxycodone-acetaminophen, oxycodone-acetaminophen, pantoprazole, phenyleph-cpm-dm-apap, pregabalin, and trazodone, and the following Facility-Administered Medications: ropivacaine (pf) 2 mg/ml (0.2%) and triamcinolone acetonide. Requested Prescriptions   Signed Prescriptions Disp Refills  . methocarbamol (ROBAXIN) 750 MG tablet 90 tablet 2    Sig: Take 1 tablet (750 mg total) by mouth every 8 (eight) hours as needed for muscle spasms.  . pregabalin (LYRICA) 150 MG capsule 60 capsule 2    Sig: Take 1 capsule (150 mg total) by mouth 2 (two) times daily.  . magnesium oxide (MAG-OX) 400 MG tablet 30 tablet 2    Sig: Take 1 tablet (400 mg total) by mouth daily.  Marland Kitchen ibuprofen (ADVIL,MOTRIN) 800 MG tablet 90 tablet 2    Sig: Take 1 tablet (800 mg total) by  mouth every 8 (eight) hours as needed for moderate pain.  Marland Kitchen docusate sodium (COLACE) 100 MG capsule 30 capsule 2    Sig: Take 1 capsule (100 mg total) by mouth daily.  . pantoprazole (PROTONIX) 40 MG tablet 60 tablet 2    Sig: Take 1 tablet (40 mg total) by mouth 2 (two) times daily.  Marland Kitchen oxyCODONE-acetaminophen (PERCOCET/ROXICET) 5-325 MG tablet 120 tablet 0    Sig: Take 1 tablet by mouth every 6 (six) hours as needed for moderate pain or severe pain.  Marland Kitchen oxyCODONE-acetaminophen (PERCOCET/ROXICET) 5-325 MG tablet 120 tablet 0    Sig: Take 1 tablet by mouth every 6 (six) hours as needed for moderate pain or severe pain.  Marland Kitchen oxyCODONE-acetaminophen (PERCOCET/ROXICET) 5-325 MG tablet 120 tablet 0    Sig: Take 1 tablet by mouth every 6 (six) hours as needed for moderate pain or severe pain.    ROS:  Constitutional: Afebrile, no chills, well hydrated and well nourished Gastrointestinal: negative Musculoskeletal:negative Neurological: negative Behavioral/Psych: negative  PFSH:  Medical:  Rick Carter  has a past medical history of Depression; Anxiety; COPD (chronic obstructive pulmonary disease) ([place]); Hypertension; History of alcoholism ([place]) (08/08/2015); and History of pneumonectomy (08/08/2015). Family: family history includes Alcohol abuse in his father; Kidney disease in his mother. Surgical:  has past surgical history that includes Tonsillectomy; Lung removal, partial; and Neck surgery. Tobacco:  reports that he has been smoking Cigarettes.  He has a 51 pack-year smoking history. He does not have any smokeless tobacco history on file. Alcohol:  reports that he does not drink alcohol. Drug:  has no drug history on file.  Physical Exam:  Vitals:  Today's Vitals   10/02/15 1106  BP: 133/70  Pulse: 62  Temp: 98.5 F (36.9 C)  TempSrc: Oral  Resp: 18  Height: _0  (1.753 m)  Weight: 166 lb (75.297 kg)  SpO2: 97%  PainSc: 6   PainLoc: Neck  Calculated BMI: Body mass index is 24.5  kg/(m^2). General appearance: alert, cooperative, appears stated age and mild distress Eyes: PERLA  Respiratory: No evidence respiratory distress, no audible rales or ronchi and no use of accessory muscles of respiration Neck: no adenopathy, no carotid bruit, no JVD, supple, symmetrical, trachea midline and thyroid not enlarged, symmetric, no tenderness/mass/nodules  Procedure: Type: Ligament or Tendon Injection Region: Palmar aspect of hand   area. Level: Little finger   Laterality: Right Hand  Indications: Right hand middle finger trigger finger pain  Description of Procedure Process:  Time-out: "Time-out" completed before starting procedure, as per protocol. Position: As per procedure #1. Target Area: Myofascial Trigger Point Approach: Direct percutaneous approach. Area Prepped: Entire Palmar aspect of the right hand   Region Prepping solution: See procedure #1. Safety Precautions: Same as per procedure #1. Description of the Procedure: The trigger area was identified and marked. Prepping and infiltration as per procedure #1. Appropriate amount of time allowed to pass for local anesthetics to take effect. The procedure needle was then advanced to the target area.  Negative aspiration confirmed. Solution injected in intermittent fashion. The needle was then removed and the area cleansed as per procedure #1. EBL: Minimal Materials & Medications Used:  Needle(s) Used: 25g - 1.5" Needle(s) Solution Injected: 0.9% ropivacaine (3 ml) plus triamcinolone 40 mg per mL (1 mL)  Imaging Guidance:  Type of Imaging Technique: None Indication(s): N/A   Assessment:  Encounter Diagnosis:  Primary Diagnosis: Chronic pain [G89.29]  Plan:  Interventional Therapies: PRN procedure(s): 1. Left cervical epidural steroid injection under fluoroscopic task, without sedation. (For left upper extremity radiculopathy) 2. Left hand trigger finger injection without sedation. (For left hand trigger finger  pain) 3. Right L4-5 lumbar epidural steroid injection under fluoroscopic guidance without sedation. (For right lower extremity radiculopathy) 4. Left lumbar facet block under fluoroscopic guidance, with sedation. (For left lower back pain)  Rick Carter was seen today for neck pain.  Diagnoses and all orders for this visit:  Chronic pain -     methocarbamol (ROBAXIN) 750 MG tablet; Take 1 tablet (750 mg total) by mouth every 8 (eight) hours as needed for muscle spasms. -     COMPLETE METABOLIC PANEL WITH GFR; Future -     C-reactive protein; Future -     Magnesium; Future -     Vitamin D2,D3 Panel; Future -     Sedimentation rate; Future -     oxyCODONE-acetaminophen (PERCOCET/ROXICET) 5-325 MG tablet; Take 1 tablet by mouth every 6 (six) hours as needed for moderate pain or severe pain. -     oxyCODONE-acetaminophen (PERCOCET/ROXICET) 5-325 MG tablet; Take 1 tablet by mouth every 6 (six) hours as needed for moderate pain or severe pain. -     oxyCODONE-acetaminophen (PERCOCET/ROXICET) 5-325 MG tablet; Take 1 tablet by mouth every 6 (six) hours as needed for moderate pain or severe pain.  Opiate use  Long term prescription opiate use  Long term current use of opiate analgesic -     Drugs of abuse screen w/o alc, rtn urine-sln  Encounter for therapeutic drug level monitoring  Failed cervical surgery syndrome -     CERVICAL EPIDURAL STEROID INJECTION; Standing  Lumbar facet syndrome (R>L) -     LUMBAR FACET(MEDIAL BRANCH NERVE BLOCK) MBNB; Standing  Musculoskeletal pain -     methocarbamol (ROBAXIN) 750 MG tablet; Take 1 tablet (750 mg total) by mouth every 8 (eight) hours as needed for muscle spasms.  Myofascial pain  Neurogenic pain  Neuropathic pain  Fibromyalgia -     pregabalin (LYRICA) 150 MG capsule; Take 1 capsule (150 mg  total) by mouth 2 (two) times daily.  Opioid-induced constipation (OIC) -     docusate sodium (COLACE) 100 MG capsule; Take 1 capsule (100 mg total)  by mouth daily.  Primary osteoarthritis involving multiple joints -     ibuprofen (ADVIL,MOTRIN) 800 MG tablet; Take 1 tablet (800 mg total) by mouth every 8 (eight) hours as needed for moderate pain.  NSAID induced gastritis -     magnesium oxide (MAG-OX) 400 MG tablet; Take 1 tablet (400 mg total) by mouth daily. -     pantoprazole (PROTONIX) 40 MG tablet; Take 1 tablet (40 mg total) by mouth 2 (two) times daily.  Trigger finger of right hand (middle finger) -     Injection tendon or ligament -     triamcinolone acetonide (KENALOG-40) injection 40 mg; 1 mL (40 mg total) by Other route once. -     ropivacaine (PF) 2 mg/ml (0.2%) (NAROPIN) epidural 3 mL; 3 mLs by Infiltration route once.     There are no Patient Instructions on file for this visit. Medications discontinued today:  Medications Discontinued During This Encounter  Medication Reason  . dexamethasone (DECADRON) injection 10 mg   . lidocaine (PF) (XYLOCAINE) 1 % injection 10 mL   . ropivacaine (PF) 2 mg/ml (0.2%) (NAROPIN) epidural 1 mL   . sodium chloride 0.9 % injection 1 mL   . methocarbamol (ROBAXIN) 750 MG tablet Reorder  . pregabalin (LYRICA) 150 MG capsule Reorder  . magnesium oxide (MAG-OX) 400 MG tablet Reorder  . ibuprofen (ADVIL,MOTRIN) 800 MG tablet Reorder  . docusate sodium (COLACE) 100 MG capsule Reorder  . pantoprazole (PROTONIX) 40 MG tablet Reorder  . oxyCODONE-acetaminophen (PERCOCET/ROXICET) 5-325 MG tablet Reorder   Medications administered today:  Mr. Strauch had no medications administered during this visit.  Primary Care Physician: No primary care provider on file. Location: [facility] Note by: Vaani Morren A. Dossie Arbour, M.D, DABA, DABAPM, DABPM, DABIPP, FIPP

## 2015-10-02 NOTE — Progress Notes (Signed)
Pill count: Oxycodone # 34 Possible injection today for the left middle finger

## 2015-10-09 LAB — TOXASSURE SELECT 13 (MW), URINE: PDF: 0

## 2015-10-10 ENCOUNTER — Ambulatory Visit
Admission: RE | Admit: 2015-10-10 | Discharge: 2015-10-10 | Disposition: A | Discharge status: Home / Self Care | Payer: Medicare Other | Source: Ambulatory Visit | Attending: Pain Medicine | Admitting: Pain Medicine

## 2015-10-10 ENCOUNTER — Other Ambulatory Visit
Admission: RE | Admit: 2015-10-10 | Discharge: 2015-10-10 | Disposition: A | Discharge status: Home / Self Care | Payer: Medicare Other | Source: Ambulatory Visit | Attending: Pain Medicine | Admitting: Pain Medicine

## 2015-10-10 DIAGNOSIS — M546 Pain in thoracic spine: Secondary | ICD-10-CM | POA: Insufficient documentation

## 2015-10-10 DIAGNOSIS — G8929 Other chronic pain: Secondary | ICD-10-CM | POA: Diagnosis present

## 2015-10-10 DIAGNOSIS — M5414 Radiculopathy, thoracic region: Secondary | ICD-10-CM | POA: Diagnosis present

## 2015-10-10 DIAGNOSIS — F1190 Opioid use, unspecified, uncomplicated: Secondary | ICD-10-CM | POA: Insufficient documentation

## 2015-10-10 LAB — COMPREHENSIVE METABOLIC PANEL
ALBUMIN: 4.5 g/dL (ref 3.5–5.0)
ALK PHOS: 49 U/L (ref 38–126)
ALT: 16 U/L — ABNORMAL LOW (ref 17–63)
ANION GAP: 8 (ref 5–15)
AST: 21 U/L (ref 15–41)
BILIRUBIN TOTAL: 0.6 mg/dL (ref 0.3–1.2)
BUN: 13 mg/dL (ref 6–20)
CO2: 30 mmol/L (ref 22–32)
Calcium: 9.8 mg/dL (ref 8.9–10.3)
Chloride: 98 mmol/L — ABNORMAL LOW (ref 101–111)
Creatinine, Ser: 0.82 mg/dL (ref 0.61–1.24)
GFR calc Af Amer: >60 mL/min (ref >60)
GFR calc non Af Amer: >60 mL/min (ref >60)
GLUCOSE: 96 mg/dL (ref 65–99)
POTASSIUM: 3.5 mmol/L (ref 3.5–5.1)
SODIUM: 136 mmol/L (ref 135–145)
TOTAL PROTEIN: 7.1 g/dL (ref 6.5–8.1)

## 2015-10-10 LAB — SEDIMENTATION RATE: Sed Rate: 1 mm/hr (ref 0–20)

## 2015-10-10 LAB — MAGNESIUM: MAGNESIUM: 2.3 mg/dL (ref 1.7–2.4)

## 2015-10-10 LAB — C-REACTIVE PROTEIN: CRP: 0.8 mg/dL (ref <1.0)

## 2015-10-12 LAB — 25-HYDROXYVITAMIN D LCMS D2+D3: 25-HYDROXY, VITAMIN D-3: 50 ng/mL

## 2015-10-12 LAB — 25-HYDROXY VITAMIN D LCMS D2+D3: 25-Hydroxy, Vitamin D: 51 ng/mL

## 2015-10-31 ENCOUNTER — Ambulatory Visit: Payer: Medicare Other | Attending: Pain Medicine | Admitting: Pain Medicine

## 2015-10-31 ENCOUNTER — Encounter: Payer: Self-pay | Admitting: Pain Medicine

## 2015-10-31 ENCOUNTER — Other Ambulatory Visit: Payer: Self-pay | Admitting: Pain Medicine

## 2015-10-31 VITALS — BP 82/60 | HR 78 | Temp 97.9°F | Resp 18 | Ht 69.0 in | Wt 166.0 lb

## 2015-10-31 DIAGNOSIS — F1190 Opioid use, unspecified, uncomplicated: Secondary | ICD-10-CM | POA: Diagnosis not present

## 2015-10-31 DIAGNOSIS — Z79891 Long term (current) use of opiate analgesic: Secondary | ICD-10-CM

## 2015-10-31 DIAGNOSIS — M546 Pain in thoracic spine: Secondary | ICD-10-CM | POA: Insufficient documentation

## 2015-10-31 DIAGNOSIS — K219 Gastro-esophageal reflux disease without esophagitis: Secondary | ICD-10-CM | POA: Diagnosis not present

## 2015-10-31 DIAGNOSIS — M6530 Trigger finger, unspecified finger: Secondary | ICD-10-CM

## 2015-10-31 DIAGNOSIS — M65331 Trigger finger, right middle finger: Secondary | ICD-10-CM | POA: Diagnosis not present

## 2015-10-31 DIAGNOSIS — M5030 Other cervical disc degeneration, unspecified cervical region: Secondary | ICD-10-CM | POA: Insufficient documentation

## 2015-10-31 DIAGNOSIS — G8929 Other chronic pain: Secondary | ICD-10-CM | POA: Diagnosis not present

## 2015-10-31 DIAGNOSIS — E785 Hyperlipidemia, unspecified: Secondary | ICD-10-CM | POA: Insufficient documentation

## 2015-10-31 DIAGNOSIS — M549 Dorsalgia, unspecified: Secondary | ICD-10-CM | POA: Diagnosis present

## 2015-10-31 DIAGNOSIS — I10 Essential (primary) hypertension: Secondary | ICD-10-CM | POA: Insufficient documentation

## 2015-10-31 DIAGNOSIS — I2510 Atherosclerotic heart disease of native coronary artery without angina pectoris: Secondary | ICD-10-CM | POA: Diagnosis not present

## 2015-10-31 DIAGNOSIS — F3189 Other bipolar disorder: Secondary | ICD-10-CM | POA: Insufficient documentation

## 2015-10-31 DIAGNOSIS — I4891 Unspecified atrial fibrillation: Secondary | ICD-10-CM | POA: Diagnosis not present

## 2015-10-31 DIAGNOSIS — F17210 Nicotine dependence, cigarettes, uncomplicated: Secondary | ICD-10-CM | POA: Diagnosis not present

## 2015-10-31 DIAGNOSIS — F418 Other specified anxiety disorders: Secondary | ICD-10-CM | POA: Diagnosis not present

## 2015-10-31 DIAGNOSIS — J449 Chronic obstructive pulmonary disease, unspecified: Secondary | ICD-10-CM | POA: Insufficient documentation

## 2015-10-31 NOTE — Progress Notes (Signed)
Oxycodone 5/396m #39 tabs remaining

## 2015-10-31 NOTE — Progress Notes (Signed)
Safety precautions to be maintained throughout the outpatient stay will include: orient to surroundings, keep bed in low position, maintain call bell within reach at all times, provide assistance with transfer out of bed and ambulation.

## 2015-11-02 NOTE — Progress Notes (Signed)
Patient's Name: Rick LADUCA Sr. MRN: [id number] DOB: 06-13-1951 DOS: 10/31/2015  Primary Reason(s) for Visit: Evaluation of uncontrolled established, chronic problem CC: Back Pain   HPI:  Rick Carter is a 65 y.o. year old, male patient, who returns today as an established patient. 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[place]); Atrial fibrillation ([place]); Bipolar affective disorder ([place]); Chronic obstructive pulmonary disease ([place]); Degeneration of intervertebral disc of cervical region; Colon, diverticulosis; Acid reflux; Essential (primary) hypertension; HLD (hyperlipidemia); Breath shortness; Compulsive tobacco user syndrome; Diverticular disease of large intestine; Post-thoracotomy pain syndrome; Encounter for therapeutic drug level monitoring; Long term current use of opiate analgesic; Long term prescription opiate use; Uncomplicated opioid dependence ([place]); Opiate use; Fibromyalgia; Chronic pain syndrome; Chronic pain; Lumbar facet syndrome (R>L); History of alcoholism ([place]); Chronic low back pain; Lumbar spondylosis; Cervical spondylosis; Failed cervical surgery syndrome; Nicotine dependence; Tobacco abuse; Substance Use Disorder Risk: High; History of right-sided partial pneumonectomy; Atherosclerotic peripheral vascular disease ([place]); Musculoskeletal pain; Myofascial pain; Neurogenic pain; Neuropathic pain; Opioid-induced constipation (OIC); Osteoarthritis; NSAID induced gastritis; Trigger finger of right hand (middle finger); Thoracic spine pain; and Radicular pain of thoracic region on his problem list.. His primarily concern today is the Back Pain   The patient returns to the clinics today indicating that he still having problems with his trigger finger. The first time we did to injection it provided him with really good relief for a while, but the second time it didn't. Because of this, we have decided to go in and refer the patient to a hand surgeon to see if there is a surgical  alternative to his problem. Meanwhile, he still continues to have problems with his left thoracic area, which he is concerned that may have something to do with his smoking. He is pending to see his lung doctor and he plans to bring up the issue to see if they can check to make sure that he doesn't have cancer.  Meanwhile, he continues to suffer from chronic cervical radicular symptoms as well as chronic lumbar radicular symptoms. We have been doing epidural steroid injections into these areas, but soon we will need to get a second opinion from a neurosurgeon to see if there is anything else that can be surgically done to correct these problems. Another alternative would be to consider the possibility of an intrathecal pump. However, this is not can help a whole lot the cervical problem.  Reported Pain Score: 7 , clinically he looks like a 2-3/10. Reported level is inconsistent with clinical obrservations. Pain Descriptors / Indicators: Throbbing Pain Frequency: Constant  Date of Last Visit: 10/02/15 Service Provided on Last Visit: Med Refill  Pharmacotherapy  Review:   Onset of action: Within expected pharmacological parameters Time to Peak effect: Timing and results are as within normal expected parameters Effectiveness: Described as relatively effective, allowing for increase in activities of daily living (ADL) % Relief: More than 50% Side-effects or Adverse reactions: None reported Duration of action: Within normal limits for medication [place] PMP: Compliant with practice rules and regulations UDS Results: His last UDS done on 10/02/2015 came back within normal limits with no unreported substances. UDS Interpretation: Patient appears to be compliant with practice rules and regulations Medication Assessment Form: Reviewed. Patient indicates being compliant with therapy Treatment compliance: Compliant Substance Use Disorder (SUD) Risk Level: Low to moderate secondary to his prior history of  alcoholism. Pharmacologic Plan: Continue therapy as is  Lab Work: Illicit Drugs No results found for: THCU, COCAINSCRNUR,  PCPSCRNUR, MDMA, AMPHETMU, METHADONE, ETOH  Inflammation Markers Lab Results  Component Value Date   ESRSEDRATE 1 10/10/2015   CRP 0.8 10/10/2015    Renal Function Lab Results  Component Value Date   BUN 13 10/10/2015   CREATININE 0.82 10/10/2015   GFRAA >60 10/10/2015   GFRNONAA >60 10/10/2015    Hepatic Function Lab Results  Component Value Date   AST 21 10/10/2015   ALT 16* 10/10/2015   ALBUMIN 4.5 10/10/2015    Electrolytes Lab Results  Component Value Date   NA 136 10/10/2015   K 3.5 10/10/2015   CL 98* 10/10/2015   CALCIUM 9.8 10/10/2015   MG 2.3 10/10/2015    Allergies:  Rick Carter has No Known Allergies.  Meds:  The patient has a current medication list which includes the following prescription(s): aclidinium bromide, albuterol sulfate, aspirin ec, cholecalciferol, diphenhydramine, docusate sodium, fluticasone-salmeterol, hydrochlorothiazide, ibuprofen, magnesium oxide, metformin, methocarbamol, oxycodone-acetaminophen, oxycodone-acetaminophen, oxycodone-acetaminophen, pantoprazole, phenyleph-cpm-dm-apap, pregabalin, and trazodone.  Current Outpatient Prescriptions on File Prior to Visit  Medication Sig  . Aclidinium Bromide (TUDORZA PRESSAIR IN) Inhale 40 mcg into the lungs 2 (two) times daily.  . Albuterol Sulfate (VENTOLIN HFA IN) Inhale 1 puff into the lungs.  Marland Kitchen aspirin EC 81 MG tablet Take 81 mg by mouth daily.  . cholecalciferol (VITAMIN D) 1000 UNITS tablet Take 2,000 Units by mouth daily.  . diphenhydrAMINE (BENADRYL) 25 MG tablet Take 25 mg by mouth at bedtime as needed for allergies.  Marland Kitchen docusate sodium (COLACE) 100 MG capsule Take 1 capsule (100 mg total) by mouth daily.  . Fluticasone-Salmeterol (ADVAIR) 100-50 MCG/DOSE AEPB Inhale 1 puff into the lungs 2 (two) times daily.  . hydrochlorothiazide (HYDRODIURIL) 25 MG tablet  Take 25 mg by mouth daily.  Marland Kitchen ibuprofen (ADVIL,MOTRIN) 800 MG tablet Take 1 tablet (800 mg total) by mouth every 8 (eight) hours as needed for moderate pain.  . magnesium oxide (MAG-OX) 400 MG tablet Take 1 tablet (400 mg total) by mouth daily.  . metFORMIN (GLUCOPHAGE) 500 MG tablet Take 500 mg by mouth 2 (two) times daily with a meal.  . methocarbamol (ROBAXIN) 750 MG tablet Take 1 tablet (750 mg total) by mouth every 8 (eight) hours as needed for muscle spasms.  Marland Kitchen oxyCODONE-acetaminophen (PERCOCET/ROXICET) 5-325 MG tablet Take 1 tablet by mouth every 6 (six) hours as needed for moderate pain or severe pain.  Marland Kitchen oxyCODONE-acetaminophen (PERCOCET/ROXICET) 5-325 MG tablet Take 1 tablet by mouth every 6 (six) hours as needed for moderate pain or severe pain.  Marland Kitchen oxyCODONE-acetaminophen (PERCOCET/ROXICET) 5-325 MG tablet Take 1 tablet by mouth every 6 (six) hours as needed for moderate pain or severe pain.  . pantoprazole (PROTONIX) 40 MG tablet Take 1 tablet (40 mg total) by mouth 2 (two) times daily.  Marland Kitchen Phenyleph-CPM-DM-APAP (ALKA-SELTZER PLUS COLD & COUGH) 02-12-09-325 MG CAPS Take by mouth as needed.  . pregabalin (LYRICA) 150 MG capsule Take 1 capsule (150 mg total) by mouth 2 (two) times daily.  . traZODone (DESYREL) 50 MG tablet Take 50 mg by mouth at bedtime.   No current facility-administered medications on file prior to visit.    ROS:  Constitutional: Afebrile, no chills, well hydrated and well nourished Gastrointestinal: negative Musculoskeletal:negative Neurological: negative Behavioral/Psych: negative  PFSH:  Medical:  Rick Carter  has a past medical history of Depression; Anxiety; COPD (chronic obstructive pulmonary disease) ([place]); Hypertension; History of alcoholism ([place]) (08/08/2015); and History of pneumonectomy (08/08/2015). Family: family history includes Alcohol abuse in his father;  Kidney disease in his mother. Surgical:  has past surgical history that includes Tonsillectomy;  Lung removal, partial; and Neck surgery. Tobacco:  reports that he has been smoking Cigarettes.  He has a 51 pack-year smoking history. He does not have any smokeless tobacco history on file. Alcohol:  reports that he does not drink alcohol. Drug:  has no drug history on file.  Physical Exam:  Vitals:  Today's Vitals   10/31/15 1451 10/31/15 1453  BP: 82/60   Pulse: 78   Temp: 97.9 F (36.6 C)   TempSrc: Oral   Resp: 18   Height: _0  (1.753 m)   Weight: 166 lb (75.297 kg)   SpO2: 98%   PainSc:  7     Calculated BMI: Body mass index is 24.5 kg/(m^2).  General appearance: alert, cooperative, appears stated age, fatigued and mild distress Eyes: PERLA Respiratory: No evidence respiratory distress, no audible rales or ronchi and no use of accessory muscles of respiration  Cervical Spine Inspection: Normal anatomy Alignment: Symetrical Palpation: Tender ROM: Decreased  Upper Extremities Inspection: No gross anomalies detected ROM: Adequate Sensory: Normal Motor: Unremarkable Pulses: Palpable  Thoracic Spine Inspection: No gross anomalies detected Alignment: Symetrical Palpation: WNL ROM: Adequate  Lumbar Spine Inspection: No gross anomalies detected Alignment: Symetrical Palpation: Tender ROM: Decreased Provocative Tests: Lumbar Hyperextension and rotation test: Positive bilaterally with the right being worst on the left. Patrick's Maneuver: Equivocal due to the facet symptoms. Gait: Antalgic (limping)  Lower Extremities Inspection: No gross anomalies detected ROM: Adequate Sensory: Normal Motor: Unremarkable  Toe walk (S1): WNL  Heal walk (L5): WNL  Assessment & Plan:  Primary Diagnosis & Pertinent Problem List: The primary encounter diagnosis was Trigger finger of right hand (middle finger). Diagnoses of Chronic pain and Long term current use of opiate analgesic were also pertinent to this visit.  Visit Diagnosis: 1. Trigger finger of right hand  (middle finger)   2. Chronic pain   3. Long term current use of opiate analgesic     Assessment: No problem-specific assessment & plan notes found for this encounter.   Pharmacotherapy Orders: No orders of the defined types were placed in this encounter.    Lab-work & Procedure Orders: Orders Placed This Encounter  Procedures  . Ambulatory referral to Orthopedic Surgery    Referral Priority:  Routine    Referral Type:  Surgical    Referral Reason:  Specialty Services Required    Requested Specialty:  Orthopedic Surgery    Number of Visits Requested:  1    Radiology Orders: AMB REFERRAL TO ORTHOPEDIC SURGERY  Interventional Therapies: PRN procedure: 1. Right lumbar facet block under fluoroscopic guidance and IV sedation. Last one done on 07/23/2015.  2. Left cervical epidural steroid injection under fluoroscopic guidance, without sedation. Last one done on 09/12/2015.    Administered Medications: Rick Carter had no medications administered during this visit.  Primary Care Physician: No primary care provider on file. Location: [facility] Note by: Maurya Nethery A. Dossie Arbour, M.D, DABA, DABAPM, DABPM, DABIPP, FIPP

## 2015-11-05 LAB — TOXASSURE SELECT 13 (MW), URINE: PDF: 0

## 2015-11-07 ENCOUNTER — Other Ambulatory Visit: Payer: Self-pay | Admitting: Pain Medicine

## 2015-12-12 ENCOUNTER — Encounter: Payer: Self-pay | Admitting: Pain Medicine

## 2015-12-12 ENCOUNTER — Ambulatory Visit: Payer: Medicare Other | Attending: Pain Medicine | Admitting: Pain Medicine

## 2015-12-12 VITALS — BP 157/89 | HR 75 | Temp 98.6°F | Resp 18 | Ht 69.0 in | Wt 151.5 lb

## 2015-12-12 DIAGNOSIS — I10 Essential (primary) hypertension: Secondary | ICD-10-CM | POA: Insufficient documentation

## 2015-12-12 DIAGNOSIS — I4891 Unspecified atrial fibrillation: Secondary | ICD-10-CM | POA: Insufficient documentation

## 2015-12-12 DIAGNOSIS — E785 Hyperlipidemia, unspecified: Secondary | ICD-10-CM | POA: Diagnosis not present

## 2015-12-12 DIAGNOSIS — M5410 Radiculopathy, site unspecified: Secondary | ICD-10-CM | POA: Diagnosis not present

## 2015-12-12 DIAGNOSIS — F17200 Nicotine dependence, unspecified, uncomplicated: Secondary | ICD-10-CM | POA: Insufficient documentation

## 2015-12-12 DIAGNOSIS — I70209 Unspecified atherosclerosis of native arteries of extremities, unspecified extremity: Secondary | ICD-10-CM | POA: Insufficient documentation

## 2015-12-12 DIAGNOSIS — X58XXXA Exposure to other specified factors, initial encounter: Secondary | ICD-10-CM | POA: Insufficient documentation

## 2015-12-12 DIAGNOSIS — F1120 Opioid dependence, uncomplicated: Secondary | ICD-10-CM | POA: Diagnosis not present

## 2015-12-12 DIAGNOSIS — M47816 Spondylosis without myelopathy or radiculopathy, lumbar region: Secondary | ICD-10-CM | POA: Insufficient documentation

## 2015-12-12 DIAGNOSIS — M4726 Other spondylosis with radiculopathy, lumbar region: Secondary | ICD-10-CM

## 2015-12-12 DIAGNOSIS — G8929 Other chronic pain: Secondary | ICD-10-CM | POA: Diagnosis not present

## 2015-12-12 DIAGNOSIS — J449 Chronic obstructive pulmonary disease, unspecified: Secondary | ICD-10-CM | POA: Diagnosis not present

## 2015-12-12 DIAGNOSIS — M5030 Other cervical disc degeneration, unspecified cervical region: Secondary | ICD-10-CM | POA: Diagnosis not present

## 2015-12-12 DIAGNOSIS — M546 Pain in thoracic spine: Secondary | ICD-10-CM | POA: Insufficient documentation

## 2015-12-12 DIAGNOSIS — G8912 Acute post-thoracotomy pain: Secondary | ICD-10-CM | POA: Diagnosis not present

## 2015-12-12 DIAGNOSIS — T39395A Adverse effect of other nonsteroidal anti-inflammatory drugs [NSAID], initial encounter: Secondary | ICD-10-CM | POA: Diagnosis not present

## 2015-12-12 DIAGNOSIS — M5414 Radiculopathy, thoracic region: Secondary | ICD-10-CM

## 2015-12-12 DIAGNOSIS — M549 Dorsalgia, unspecified: Secondary | ICD-10-CM | POA: Diagnosis present

## 2015-12-12 DIAGNOSIS — K5790 Diverticulosis of intestine, part unspecified, without perforation or abscess without bleeding: Secondary | ICD-10-CM | POA: Insufficient documentation

## 2015-12-12 DIAGNOSIS — F319 Bipolar disorder, unspecified: Secondary | ICD-10-CM | POA: Insufficient documentation

## 2015-12-12 DIAGNOSIS — K2970 Gastritis, unspecified, without bleeding: Secondary | ICD-10-CM | POA: Insufficient documentation

## 2015-12-12 MED ORDER — ROPIVACAINE HCL 2 MG/ML IJ SOLN
INTRAMUSCULAR | Status: AC
  Administered 2015-12-12: 10:00:00
  Filled 2015-12-12: qty 10

## 2015-12-12 MED ORDER — ROPIVACAINE HCL 2 MG/ML IJ SOLN: 1.0000 mL | Freq: Once | INTRAMUSCULAR | Status: DC

## 2015-12-12 MED ORDER — IOHEXOL 180 MG/ML  SOLN: 5.0000 mL | Freq: Once | INTRAMUSCULAR | Status: DC | PRN

## 2015-12-12 MED ORDER — TRIAMCINOLONE ACETONIDE 40 MG/ML IJ SUSP
INTRAMUSCULAR | Status: AC
  Filled 2015-12-12: qty 1

## 2015-12-12 MED ORDER — LIDOCAINE HCL (PF) 1 % IJ SOLN: 10.0000 mL | Freq: Once | INTRAMUSCULAR | Status: DC

## 2015-12-12 MED ORDER — SODIUM CHLORIDE 0.9% FLUSH: 1.0000 mL | Freq: Once | INTRAVENOUS | Status: DC

## 2015-12-12 MED ORDER — LIDOCAINE HCL (PF) 1 % IJ SOLN
INTRAMUSCULAR | Status: AC
  Administered 2015-12-12: 10:00:00
  Filled 2015-12-12: qty 5

## 2015-12-12 MED ORDER — SODIUM CHLORIDE 0.9 % IJ SOLN
INTRAMUSCULAR | Status: AC
  Administered 2015-12-12: 10:00:00
  Filled 2015-12-12: qty 10

## 2015-12-12 MED ORDER — DEXAMETHASONE SODIUM PHOSPHATE 10 MG/ML IJ SOLN
INTRAMUSCULAR | Status: AC
  Administered 2015-12-12: 10:00:00
  Filled 2015-12-12: qty 1

## 2015-12-12 MED ORDER — DEXAMETHASONE SODIUM PHOSPHATE 10 MG/ML IJ SOLN: 10.0000 mg | Freq: Once | INTRAMUSCULAR | Status: DC

## 2015-12-12 MED ORDER — IOHEXOL 180 MG/ML  SOLN
INTRAMUSCULAR | Status: AC
  Administered 2015-12-12: 10:00:00
  Filled 2015-12-12: qty 20

## 2015-12-12 NOTE — Progress Notes (Signed)
Safety precautions to be maintained throughout the outpatient stay will include: orient to surroundings, keep bed in low position, maintain call bell within reach at all times, provide assistance with transfer out of bed and ambulation.

## 2015-12-12 NOTE — Patient Instructions (Addendum)
Smoking Cessation, Tips for Success If you are ready to quit smoking, congratulations! You have chosen to help yourself be healthier. Cigarettes bring nicotine, tar, carbon monoxide, and other irritants into your body. Your lungs, heart, and blood vessels will be able to work better without these poisons. There are many different ways to quit smoking. Nicotine gum, nicotine patches, a nicotine inhaler, or nicotine nasal spray can help with physical craving. Hypnosis, support groups, and medicines help break the habit of smoking. WHAT THINGS CAN I DO TO MAKE QUITTING EASIER?  Here are some tips to help you quit for good: 1. Pick a date when you will quit smoking completely. Tell all of your friends and family about your plan to quit on that date. 2. Do not try to slowly cut down on the number of cigarettes you are smoking. Pick a quit date and quit smoking completely starting on that day. 3. Throw away all cigarettes.  4. Clean and remove all ashtrays from your home, work, and car. 5. On a card, write down your reasons for quitting. Carry the card with you and read it when you get the urge to smoke. 6. Cleanse your body of nicotine. Drink enough water and fluids to keep your urine clear or pale yellow. Do this after quitting to flush the nicotine from your body. 7. Learn to predict your moods. Do not let a bad situation be your excuse to have a cigarette. Some situations in your life might tempt you into wanting a cigarette. 8. Never have "just one" cigarette. It leads to wanting another and another. Remind yourself of your decision to quit. 9. Change habits associated with smoking. If you smoked while driving or when feeling stressed, try other activities to replace smoking. Stand up when drinking your coffee. Brush your teeth after eating. Sit in a different chair when you read the paper. Avoid alcohol while trying to quit, and try to drink fewer caffeinated beverages. Alcohol and caffeine may urge you  to smoke. 10. Avoid foods and drinks that can trigger a desire to smoke, such as sugary or spicy foods and alcohol. 11. Ask people who smoke not to smoke around you. 12. Have something planned to do right after eating or having a cup of coffee. For example, plan to take a walk or exercise. 13. Try a relaxation exercise to calm you down and decrease your stress. Remember, you may be tense and nervous for the first 2 weeks after you quit, but this will pass. 15. Find new activities to keep your hands busy. Play with a pen, coin, or rubber band. Doodle or draw things on paper. 15. Brush your teeth right after eating. This will help cut down on the craving for the taste of tobacco after meals. You can also try mouthwash.  16. Use oral substitutes in place of cigarettes. Try using lemon drops, carrots, cinnamon sticks, or chewing gum. Keep them handy so they are available when you have the urge to smoke. 17. When you have the urge to smoke, try deep breathing. 56. Designate your home as a nonsmoking area. 19. If you are a heavy smoker, ask your health care provider about a prescription for nicotine chewing gum. It can ease your withdrawal from nicotine. 20. Reward yourself. Set aside the cigarette money you save and buy yourself something nice. 21. Look for support from others. Join a support group or smoking cessation program. Ask someone at home or at work to help you with your plan  to quit smoking. 22. Always ask yourself, "Do I need this cigarette or is this just a reflex?" Tell yourself, "Today, I choose not to smoke," or "I do not want to smoke." You are reminding yourself of your decision to quit. 23. Do not replace cigarette smoking with electronic cigarettes (commonly called e-cigarettes). The safety of e-cigarettes is unknown, and some may contain harmful chemicals. 24. If you relapse, do not give up! Plan ahead and think about what you will do the next time you get the urge to smoke. HOW WILL  I FEEL WHEN I QUIT SMOKING? You may have symptoms of withdrawal because your body is used to nicotine (the addictive substance in cigarettes). You may crave cigarettes, be irritable, feel very hungry, cough often, get headaches, or have difficulty concentrating. The withdrawal symptoms are only temporary. They are strongest when you first quit but will go away within 10-14 days. When withdrawal symptoms occur, stay in control. Think about your reasons for quitting. Remind yourself that these are signs that your body is healing and getting used to being without cigarettes. Remember that withdrawal symptoms are easier to treat than the major diseases that smoking can cause.  Even after the withdrawal is over, expect periodic urges to smoke. However, these cravings are generally short lived and will go away whether you smoke or not. Do not smoke! WHAT RESOURCES ARE AVAILABLE TO HELP ME QUIT SMOKING? Your health care provider can direct you to community resources or hospitals for support, which may include: 1. Group support. 2. Education. 3. Hypnosis. 4. Therapy.   This information is not intended to replace advice given to you by your health care provider. Make sure you discuss any questions you have with your health care provider.   Document Released: 06/28/2004 Document Revised: 10/21/2014 Document Reviewed: 03/18/2013 [employer] Interactive Patient Education 2016 [employer] Pain Management Discharge Instructions  General Discharge Instructions :  If you need to reach your doctor call: Monday-Friday 8:00 am - 4:00 pm at [phone] or toll free [phone].  After clinic hours [phone] to have operator reach doctor.  Bring all of your medication bottles to all your appointments in the pain clinic.  To cancel or reschedule your appointment with Pain Management please remember to call 24 hours in advance to avoid a fee.  Refer to the educational materials which you have been given on:  General Risks, I had my Procedure. Discharge Instructions, Post Sedation.  Post Procedure Instructions:  The drugs you were given will stay in your system until tomorrow, so for the next 24 hours you should not drive, make any legal decisions or drink any alcoholic beverages.  You may eat anything you prefer, but it is better to start with liquids then soups and crackers, and gradually work up to solid foods.  Please notify your doctor immediately if you have any unusual bleeding, trouble breathing or pain that is not related to your normal pain.  Depending on the type of procedure that was done, some parts of your body may feel week and/or numb.  This usually clears up by tonight or the next day.  Walk with the use of an assistive device or accompanied by an adult for the 24 hours.  You may use ice on the affected area for the first 24 hours.  Put ice in a Ziploc bag and cover with a towel and place against area 15 minutes on 15 minutes off.  You may switch to heat after 24 hours.GENERAL RISKS AND  COMPLICATIONS  What are the risk, side effects and possible complications? Generally speaking, most procedures are safe.  However, with any procedure there are risks, side effects, and the possibility of complications.  The risks and complications are dependent upon the sites that are lesioned, or the type of nerve block to be performed.  The closer the procedure is to the spine, the more serious the risks are.  Great care is taken when placing the radio frequency needles, block needles or lesioning probes, but sometimes complications can occur. 1. Infection: Any time there is an injection through the skin, there is a risk of infection.  This is why sterile conditions are used for these blocks.  There are four possible types of infection. 1. Localized skin infection. 2. Central Nervous System Infection-This can be in the form of Meningitis, which can be deadly. 3. Epidural Infections-This can be in  the form of an epidural abscess, which can cause pressure inside of the spine, causing compression of the spinal cord with subsequent paralysis. This would require an emergency surgery to decompress, and there are no guarantees that the patient would recover from the paralysis. 4. Discitis-This is an infection of the intervertebral discs.  It occurs in about 1% of discography procedures.  It is difficult to treat and it may lead to surgery.        2. Pain: the needles have to go through skin and soft tissues, will cause soreness.       3. Damage to internal structures:  The nerves to be lesioned may be near blood vessels or    other nerves which can be potentially damaged.       4. Bleeding: Bleeding is more common if the patient is taking blood thinners such as  aspirin, Coumadin, Ticiid, Plavix, etc., or if he/she have some genetic predisposition  such as hemophilia. Bleeding into the spinal canal can cause compression of the spinal  cord with subsequent paralysis.  This would require an emergency surgery to  decompress and there are no guarantees that the patient would recover from the  paralysis.       5. Pneumothorax:  Puncturing of a lung is a possibility, every time a needle is introduced in  the area of the chest or upper back.  Pneumothorax refers to free air around the  collapsed lung(s), inside of the thoracic cavity (chest cavity).  Another two possible  complications related to a similar event would include: Hemothorax and Chylothorax.   These are variations of the Pneumothorax, where instead of air around the collapsed  lung(s), you may have blood or chyle, respectively.       6. Spinal headaches: They may occur with any procedures in the area of the spine.       7. Persistent CSF (Cerebro-Spinal Fluid) leakage: This is a rare problem, but may occur  with prolonged intrathecal or epidural catheters either due to the formation of a fistulous  track or a dural tear.       8. Nerve damage: By  working so close to the spinal cord, there is always a possibility of  nerve damage, which could be as serious as a permanent spinal cord injury with  paralysis.       9. Death:  Although rare, severe deadly allergic reactions known as "Anaphylactic  reaction" can occur to any of the medications used.      10. Worsening of the symptoms:  We can always make thing worse.  What  are the chances of something like this happening? Chances of any of this occuring are extremely low.  By statistics, you have more of a chance of getting killed in a motor vehicle accident: while driving to the hospital than any of the above occurring .  Nevertheless, you should be aware that they are possibilities.  In general, it is similar to taking a shower.  Everybody knows that you can slip, hit your head and get killed.  Does that mean that you should not shower again?  Nevertheless always keep in mind that statistics do not mean anything if you happen to be on the wrong side of them.  Even if a procedure has a 1 (one) in a 1,000,000 (million) chance of going wrong, it you happen to be that one..Also, keep in mind that by statistics, you have more of a chance of having something go wrong when taking medications.  Who should not have this procedure? If you are on a blood thinning medication (e.g. Coumadin, Plavix, see list of "Blood Thinners"), or if you have an active infection going on, you should not have the procedure.  If you are taking any blood thinners, please inform your physician.  How should I prepare for this procedure?  Do not eat or drink anything at least six hours prior to the procedure.  Bring a driver with you .  It cannot be a taxi.  Come accompanied by an adult that can drive you back, and that is strong enough to help you if your legs get weak or numb from the local anesthetic.  Take all of your medicines the morning of the procedure with just enough water to swallow them.  If you have diabetes,  make sure that you are scheduled to have your procedure done first thing in the morning, whenever possible.  If you have diabetes, take only half of your insulin dose and notify our nurse that you have done so as soon as you arrive at the clinic.  If you are diabetic, but only take blood sugar pills (oral hypoglycemic), then do not take them on the morning of your procedure.  You may take them after you have had the procedure.  Do not take aspirin or any aspirin-containing medications, at least eleven (11) days prior to the procedure.  They may prolong bleeding.  Wear loose fitting clothing that may be easy to take off and that you would not mind if it got stained with Betadine or blood.  Do not wear any jewelry or perfume  Remove any nail coloring.  It will interfere with some of our monitoring equipment.  NOTE: Remember that this is not meant to be interpreted as a complete list of all possible complications.  Unforeseen problems may occur.  BLOOD THINNERS The following drugs contain aspirin or other products, which can cause increased bleeding during surgery and should not be taken for 2 weeks prior to and 1 week after surgery.  If you should need take something for relief of minor pain, you may take acetaminophen which is found in Tylenol,m Datril, Anacin-3 and Panadol. It is not blood thinner. The products listed below are.  Do not take any of the products listed below in addition to any listed on your instruction sheet.  A.P.C or A.P.C with Codeine Codeine Phosphate Capsules #3 Ibuprofen Ridaura  ABC compound Congesprin Imuran rimadil  Advil Cope Indocin Robaxisal  Alka-Seltzer Effervescent Pain Reliever and Antacid Coricidin or Coricidin-D  Indomethacin Rufen  Alka-Seltzer plus  Cold Medicine Cosprin Ketoprofen S-A-C Tablets  Anacin Analgesic Tablets or Capsules Coumadin Korlgesic Salflex  Anacin Extra Strength Analgesic tablets or capsules CP-2 Tablets Lanoril Salicylate  Anaprox  Cuprimine Capsules Levenox Salocol  Anexsia-D Dalteparin Magan Salsalate  Anodynos Darvon compound Magnesium Salicylate Sine-off  Ansaid Dasin Capsules Magsal Sodium Salicylate  Anturane Depen Capsules Marnal Soma  APF Arthritis pain formula Dewitt's Pills Measurin Stanback  Argesic Dia-Gesic Meclofenamic Sulfinpyrazone  Arthritis Bayer Timed Release Aspirin Diclofenac Meclomen Sulindac  Arthritis pain formula Anacin Dicumarol Medipren Supac  Analgesic (Safety coated) Arthralgen Diffunasal Mefanamic Suprofen  Arthritis Strength Bufferin Dihydrocodeine Mepro Compound Suprol  Arthropan liquid Dopirydamole Methcarbomol with Aspirin Synalgos  ASA tablets/Enseals Disalcid Micrainin Tagament  Ascriptin Doan's Midol Talwin  Ascriptin A/D Dolene Mobidin Tanderil  Ascriptin Extra Strength Dolobid Moblgesic Ticlid  Ascriptin with Codeine Doloprin or Doloprin with Codeine Momentum Tolectin  Asperbuf Duoprin Mono-gesic Trendar  Aspergum Duradyne Motrin or Motrin IB Triminicin  Aspirin plain, buffered or enteric coated Durasal Myochrisine Trigesic  Aspirin Suppositories Easprin Nalfon Trillsate  Aspirin with Codeine Ecotrin Regular or Extra Strength Naprosyn Uracel  Atromid-S Efficin Naproxen Ursinus  Auranofin Capsules Elmiron Neocylate Vanquish  Axotal Emagrin Norgesic Verin  Azathioprine Empirin or Empirin with Codeine Normiflo Vitamin E  Azolid Emprazil Nuprin Voltaren  Bayer Aspirin plain, buffered or children's or timed BC Tablets or powders Encaprin Orgaran Warfarin Sodium  Buff-a-Comp Enoxaparin Orudis Zorpin  Buff-a-Comp with Codeine Equegesic Os-Cal-Gesic   Buffaprin Excedrin plain, buffered or Extra Strength Oxalid   Bufferin Arthritis Strength Feldene Oxphenbutazone   Bufferin plain or Extra Strength Feldene Capsules Oxycodone with Aspirin   Bufferin with Codeine Fenoprofen Fenoprofen Pabalate or Pabalate-SF   Buffets II Flogesic Panagesic   Buffinol plain or Extra Strength Florinal  or Florinal with Codeine Panwarfarin   Buf-Tabs Flurbiprofen Penicillamine   Butalbital Compound Four-way cold tablets Penicillin   Butazolidin Fragmin Pepto-Bismol   Carbenicillin Geminisyn Percodan   Carna Arthritis Reliever Geopen Persantine   Carprofen Gold's salt Persistin   Chloramphenicol Goody's Phenylbutazone   Chloromycetin Haltrain Piroxlcam   Clmetidine heparin Plaquenil   Cllnoril Hyco-pap Ponstel   Clofibrate Hydroxy chloroquine Propoxyphen         Before stopping any of these medications, be sure to consult the physician who ordered them.  Some, such as Coumadin (Warfarin) are ordered to prevent or treat serious conditions such as "deep thrombosis", "pumonary embolisms", and other heart problems.  The amount of time that you may need off of the medication may also vary with the medication and the reason for which you were taking it.  If you are taking any of these medications, please make sure you notify your pain physician before you undergo any procedures.         Epidural Steroid Injection Patient Information  Description: The epidural space surrounds the nerves as they exit the spinal cord.  In some patients, the nerves can be compressed and inflamed by a bulging disc or a tight spinal canal (spinal stenosis).  By injecting steroids into the epidural space, we can bring irritated nerves into direct contact with a potentially helpful medication.  These steroids act directly on the irritated nerves and can reduce swelling and inflammation which often leads to decreased pain.  Epidural steroids may be injected anywhere along the spine and from the neck to the low back depending upon the location of your pain.   After numbing the skin with local anesthetic (like Novocaine), a small needle is passed into the  epidural space slowly.  You may experience a sensation of pressure while this is being done.  The entire block usually last less than 10 minutes.  Conditions which may  be treated by epidural steroids:   Low back and leg pain  Neck and arm pain  Spinal stenosis  Post-laminectomy syndrome  Herpes zoster (shingles) pain  Pain from compression fractures  Preparation for the injection:  5. Do not eat any solid food or dairy products within 6 hours of your appointment.  6. You may drink clear liquids up to 2 hours before appointment.  Clear liquids include water, black coffee, juice or soda.  No milk or cream please. 7. You may take your regular medication, including pain medications, with a sip of water before your appointment  Diabetics should hold regular insulin (if taken separately) and take 1/2 normal NPH dos the morning of the procedure.  Carry some sugar containing items with you to your appointment. 8. A driver must accompany you and be prepared to drive you home after your procedure.  9. Bring all your current medications with your. 10. An IV may be inserted and sedation may be given at the discretion of the physician.   11. A blood pressure cuff, EKG and other monitors will often be applied during the procedure.  Some patients may need to have extra oxygen administered for a short period. 12. You will be asked to provide medical information, including your allergies, prior to the procedure.  We must know immediately if you are taking blood thinners (like Coumadin/Warfarin)  Or if you are allergic to IV iodine contrast (dye). We must know if you could possible be pregnant.  Possible side-effects:  Bleeding from needle site  Infection (rare, may require surgery)  Nerve injury (rare)  Numbness & tingling (temporary)  Difficulty urinating (rare, temporary)  Spinal headache ( a headache worse with upright posture)  Light -headedness (temporary)  Pain at injection site (several days)  Decreased blood pressure (temporary)  Weakness in arm/leg (temporary)  Pressure sensation in back/neck (temporary)  Call if you  experience:  Fever/chills associated with headache or increased back/neck pain.  Headache worsened by an upright position.  New onset weakness or numbness of an extremity below the injection site  Hives or difficulty breathing (go to the emergency room)  Inflammation or drainage at the infection site  Severe back/neck pain  Any new symptoms which are concerning to you  Please note:  Although the local anesthetic injected can often make your back or neck feel good for several hours after the injection, the pain will likely return.  It takes 3-7 days for steroids to work in the epidural space.  You may not notice any pain relief for at least that one week.  If effective, we will often do a series of three injections spaced 3-6 weeks apart to maximally decrease your pain.  After the initial series, we generally will wait several months before considering a repeat injection of the same type.  If you have any questions, please call [phone] [facility]

## 2015-12-12 NOTE — Progress Notes (Signed)
Patient's Name: Rick HERBSTER Sr. MRN: [id number] DOB: [birth year] DOS: 12/12/2015  Primary Reason(s) for Visit: Interventional Pain Management Treatment. CC: Back Pain   Pre-Procedure Assessment:  Mr. Mcewan is a 65 y.o. year old, male patient, seen today for interventional treatment. 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[place]); Atrial fibrillation ([place]); Bipolar affective disorder ([place]); Chronic obstructive pulmonary disease ([place]); Degeneration of intervertebral disc of cervical region; Colon, diverticulosis; Acid reflux; Essential (primary) hypertension; HLD (hyperlipidemia); Breath shortness; Compulsive tobacco user syndrome; Diverticular disease of large intestine; Post-thoracotomy pain syndrome; Encounter for therapeutic drug level monitoring; Long term current use of opiate analgesic; Long term prescription opiate use; Uncomplicated opioid dependence ([place]); Opiate use; Fibromyalgia; Chronic pain syndrome; Chronic pain; Lumbar facet syndrome (R>L); History of alcoholism ([place]); Chronic low back pain; Lumbar spondylosis; Cervical spondylosis; Failed cervical surgery syndrome; Nicotine dependence; Tobacco abuse; Substance Use Disorder Risk: High; History of right-sided partial pneumonectomy; Atherosclerotic peripheral vascular disease ([place]); Musculoskeletal pain; Myofascial pain; Neurogenic pain; Neuropathic pain; Opioid-induced constipation ([facility]); Osteoarthritis; NSAID induced gastritis; Trigger finger of right hand (middle finger); Thoracic spine pain; and Radicular pain of thoracic region on his problem list.. His primarily concern today is the Back Pain   The patient returns to the clinic today after having gone back and being evaluated by his pulmonologist for the chest pain. He indicates that this pain is not secondary to any long problems perform more likely to be a pinched nerve. The patient also ended up in the emergency room due to this pain. Review of the patient's medical  records shows no MRIs or CT scans of the thoracic spine and because of the nature and the pattern of the radicular thoracic pain, we will be ordering 1 as soon as possible. Today we will do a diagnostic thoracic epidural steroid injection for the purpose of determining if this is where the pain is coming from and also to provide him with some relief of this pain.  Today's Initial Pain Score: 6/10 Reported level of pain is incompatible with clinical obrservations. This may be secondary to a possible lack of understanding on how the pain scale works. Pain Type: Chronic pain Pain Location: Back Pain Orientation: Upper, Mid, Lower Pain Descriptors / Indicators: Aching, Throbbing Pain Frequency: Constant  Post-procedure Pain Score: 6   Date of Last Visit: 10/31/15 Service Provided on Last Visit: Med Refill  Verification of the correct person, correct site (including marking of site), and correct procedure were performed and confirmed by the patient.  Today's Vitals   12/12/15 0951 12/12/15 1018 12/12/15 1023 12/12/15 1030  BP:  155/85 154/90 157/89  Pulse:  80 75 75  Temp:      Resp:  _0 Height:      Weight:      SpO2:  96% 96% 96%  PainSc: 6    6   PainLoc:      Calculated BMI: Body mass index is 22.36 kg/(m^2). Allergies: He is allergic to varenicline.. Primary Diagnosis: Radicular pain of thoracic region [M54.10]  Procedure:  Type: Diagnostic Inter-Laminar Thoracic Epidural Block Region: Posterior Thoracolumbar Level: T6-7 Laterality: Midline    Indications: 1. Radicular pain of thoracic region   2. Osteoarthritis of spine with radiculopathy, lumbar region   3. Post-thoracotomy pain syndrome   4. Thoracic spine pain     In addition, Mr. Dalton has Degeneration of intervertebral disc of cervical region; Post-thoracotomy pain syndrome; Fibromyalgia; Chronic pain syndrome; Chronic pain; Lumbar facet syndrome (R>L); Chronic  low back pain; Lumbar spondylosis; Cervical  spondylosis; Failed cervical surgery syndrome; Musculoskeletal pain; Myofascial pain; Neurogenic pain; Neuropathic pain; Osteoarthritis; Trigger finger of right hand (middle finger); Thoracic spine pain; and Radicular pain of thoracic region on his pertinent problem list.  Consent: Secured. Under the influence of no sedatives a written informed consent was obtained, after having provided information on the risks and possible complications. To fulfill our ethical and legal obligations, as recommended by the American Medical Association's Code of Ethics, we have provided information to the patient about our clinical impression; the nature and purpose of the treatment or procedure; the risks, benefits, and possible complications of the intervention; alternatives; the risk(s) and benefit(s) of the alternative treatment(s) or procedure(s); and the risk(s) and benefit(s) of doing nothing. The patient was provided information about the risks and possible complications associated with the procedure. These include, but are not limited to, failure to achieve desired goals, infection, bleeding, organ or nerve damage, allergic reactions, paralysis, and death. In the case of spinal procedures these may include, but are not limited to, failure to achieve desired goals, infection, bleeding, organ or nerve damage, allergic reactions, paralysis, and death. In addition, the patient was informed that Medicine is not an exact science; therefore, there is also the possibility of unforeseen risks and possible complications that may result in a catastrophic outcome. The patient indicated having understood very clearly. We have given the patient no guarantees and we have made no promises. Enough time was given to the patient to ask questions, all of which were answered to the patient's satisfaction.  Pre-Procedure Preparation: Safety Precautions: Allergies reviewed. Appropriate site, procedure, and patient were confirmed by following  the Joint Commission's Universal Protocol (UP.01.01.01), in the form of a "Time Out". The patient was asked to confirm marked site and procedure, before commencing. The patient was asked about blood thinners, or active infections, both of which were denied. Patient was assessed for positional comfort and all pressure points were checked before starting procedure. Monitoring:  As per clinic protocol. Infection Control Precautions: Sterile technique used. Standard Universal Precautions were taken as recommended by the Department of [facility] for Disease Control and Prevention (CDC). Standard pre-surgical skin prep was conducted. Respiratory hygiene and cough etiquette was practiced. Hand hygiene observed. Safe injection practices and needle disposal techniques followed. SDV (single dose vial) medications used. Medications properly checked for expiration dates and contaminants. Personal protective equipment (PPE) used: Surgical mask. Sterile double glove technique. Radiation resistant gloves. Sterile surgical gloves.  Anesthesia, Analgesia, Anxiolysis: Type: Local Anesthesia Local Anesthetic: Lidocaine 1% Route: Infiltration ([place]/IM) IV Access: Declined Sedation: Declined  Indication(s): Analgesia    Description of Procedure Process:  Time-out: "Time-out" completed before starting procedure, as per protocol. Position: Prone Target Area: For Epidural Steroid injection(s), the target area is the  interlaminar space, initially targeting the lower border of the superior vertebral body lamina. Approach: Interlaminar approach. Area Prepped: Entire Posterior Thoracolumbar Region Prepping solution: Duraprep (Iodine Povacrylex [0.7% available Iodine] and Isopropyl Alcohol, 74% w/w) Safety Precautions: Aspiration looking for blood return was conducted prior to all injections. At no point did we inject any substances, as a needle was being advanced. No attempts were made at seeking any paresthesias.  Safe injection practices and needle disposal techniques used. Medications properly checked for expiration dates. SDV (single dose vial) medications used.   Description of the Procedure: Protocol guidelines were followed. The patient was placed in position over the fluoroscopy table. The target area was identified and  the area prepped in the usual manner. Skin & deeper tissues infiltrated with local anesthetic. Appropriate amount of time allowed to pass for local anesthetics to take effect. The procedure needles were then advanced to the target area. The inferior aspect of the superior lamina was contacted and the needle walked caudad, until the lamina was cleared. The epidural space was identified using "loss-of-resistance technique" with 0.9% PF-NSS (2-30m), in a low friction 10cc LOR glass syringe. Proper needle placement was secured. Negative aspiration confirmed. Solution injected in intermittent fashion, asking for systemic symptoms every 0.5 cc of injectate. The needles were then removed and the area cleansed, making sure to leave some of the prepping solution behind to take advantage of its long term bactericidal properties. EBL: None Materials & Medications Used:  Needle(s) Used: 20g - 10cm, Tuohy-style epidural needle Medication(s): Please see chart orders for medication and dosing details.  Imaging Guidance:  Type of Imaging Technique: Fluoroscopy Guidance (Spinal) Indication(s): Assistance in needle guidance and placement for procedures requiring needle placement in or near specific anatomical locations not easily accessible without such assistance. Exposure Time: Please see nurses notes. Contrast: Before injecting any contrast, we confirmed that the patient did not have an allergy to iodine, shellfish, or radiological contrast. Once satisfactory needle placement was completed at the desired level, radiological contrast was injected. Injection was conducted under continuous fluoroscopic  guidance. Injection of contrast accomplished without complications. See chart for type and volume of contrast used. Fluoroscopic Guidance: I was personally present in the fluoroscopy suite, where the patient was placed in position for the procedure, over the fluoroscopy-compatible table. Fluoroscopy was manipulated, using "Tunnel Vision Technique", to obtain the best possible view of the target area, on the affected side. Parallax error was corrected before commencing the procedure. A "direction-depth-direction" technique was used to introduce the needle under continuous pulsed fluoroscopic guidance. Once the target was reached, antero-posterior, oblique, and lateral fluoroscopic projection views were taken to confirm needle placement in all planes. Permanently recorded images stored by scanning into EMR. Interpretation: Intraoperative imaging interpretation by performing Physician. Adequate needle placement confirmed. Adequate needle placement confirmed in AP, lateral, & Oblique Views. Appropriate spread of contrast to desired area. No evidence of afferent or efferent intravascular uptake. No intrathecal or subarachnoid spread observed. Permanent hardcopy images in multiple planes scanned into the patient's record.  Antibiotic Prophylaxis:  Indication(s): No indications identified. Type:  Antibiotics Given (last 72 hours)    None       Post-operative Assessment:  Complications: No immediate post-treatment complications were observed. Disposition: Return to clinic for follow-up evaluation. The patient tolerated the entire procedure well. A repeat set of vitals were taken after the procedure and the patient was kept under observation following institutional policy, for this type of procedure. Post-procedural neurological assessment was performed, showing return to baseline, prior to discharge. The patient was discharged home, once institutional criteria were met. The patient was provided with  post-procedure discharge instructions, including a section on how to identify potential problems. Should any problems arise concerning this procedure, the patient was given instructions to immediately contact [place] at any time, without hesitation. In any case, we plan to contact the patient by telephone for a follow-up status report regarding this interventional procedure. Comments:  No additional relevant information.  Medications administered during this visit: We administered iohexol, lidocaine (PF), sodium chloride, ropivacaine (PF) 2 mg/ml (0.2%), sodium chloride, ropivacaine (PF) 2 mg/ml (0.2%), and dexamethasone.  Future Appointments Date Time Provider DReno 12/27/2015 10:40 AM  Milinda Pointer, MD [facility] None    Primary Care Physician: No primary care provider on file. Location: [facility] Note by: Darrelyn Morro A. Dossie Arbour, M.D, DABA, DABAPM, DABPM, DABIPP, FIPP  Disclaimer:  Medicine is not an exact science. The only guarantee in medicine is that nothing is guaranteed. It is important to note that the decision to proceed with this intervention was based on the information collected from the patient. The Data and conclusions were drawn from the patient's questionnaire, the interview, and the physical examination. Because the information was provided in large part by the patient, it cannot be guaranteed that it has not been purposely or unconsciously manipulated. Every effort has been made to obtain as much relevant data as possible for this evaluation. It is important to note that the conclusions that lead to this procedure are derived in large part from the available data. Always take into account that the treatment will also be dependent on availability of resources and existing treatment guidelines, considered by other Pain Management Practitioners as being common knowledge and practice, at the time of the intervention. For Medico-Legal purposes, it  is also important to point out that variation in procedural techniques and pharmacological choices are the acceptable norm. The indications, contraindications, technique, and results of the above procedure should only be interpreted and judged by a Board-Certified Interventional Pain Specialist with extensive familiarity and expertise in the same exact procedure and technique. Attempts at providing opinions without similar or greater experience and expertise than that of the treating physician will be considered as inappropriate and unethical, and shall result in a formal complaint to the state medical board and applicable specialty societies.

## 2015-12-13 ENCOUNTER — Telehealth: Payer: Self-pay | Admitting: *Deleted

## 2015-12-27 ENCOUNTER — Encounter: Payer: Self-pay | Admitting: Pain Medicine

## 2015-12-27 ENCOUNTER — Ambulatory Visit: Payer: Medicare Other | Attending: Pain Medicine | Admitting: Pain Medicine

## 2015-12-27 VITALS — BP 175/81 | HR 63 | Temp 97.9°F | Resp 16 | Ht 69.0 in | Wt 154.0 lb

## 2015-12-27 DIAGNOSIS — M542 Cervicalgia: Secondary | ICD-10-CM | POA: Insufficient documentation

## 2015-12-27 DIAGNOSIS — J449 Chronic obstructive pulmonary disease, unspecified: Secondary | ICD-10-CM | POA: Diagnosis not present

## 2015-12-27 DIAGNOSIS — K219 Gastro-esophageal reflux disease without esophagitis: Secondary | ICD-10-CM | POA: Diagnosis not present

## 2015-12-27 DIAGNOSIS — Z902 Acquired absence of lung [part of]: Secondary | ICD-10-CM | POA: Insufficient documentation

## 2015-12-27 DIAGNOSIS — I10 Essential (primary) hypertension: Secondary | ICD-10-CM | POA: Insufficient documentation

## 2015-12-27 DIAGNOSIS — M797 Fibromyalgia: Secondary | ICD-10-CM | POA: Insufficient documentation

## 2015-12-27 DIAGNOSIS — E785 Hyperlipidemia, unspecified: Secondary | ICD-10-CM | POA: Insufficient documentation

## 2015-12-27 DIAGNOSIS — K5790 Diverticulosis of intestine, part unspecified, without perforation or abscess without bleeding: Secondary | ICD-10-CM | POA: Diagnosis not present

## 2015-12-27 DIAGNOSIS — F319 Bipolar disorder, unspecified: Secondary | ICD-10-CM | POA: Insufficient documentation

## 2015-12-27 DIAGNOSIS — Z7982 Long term (current) use of aspirin: Secondary | ICD-10-CM | POA: Insufficient documentation

## 2015-12-27 DIAGNOSIS — I70208 Unspecified atherosclerosis of native arteries of extremities, other extremity: Secondary | ICD-10-CM | POA: Insufficient documentation

## 2015-12-27 DIAGNOSIS — K2960 Other gastritis without bleeding: Secondary | ICD-10-CM | POA: Diagnosis not present

## 2015-12-27 DIAGNOSIS — M791 Myalgia: Secondary | ICD-10-CM

## 2015-12-27 DIAGNOSIS — M545 Low back pain, unspecified: Secondary | ICD-10-CM

## 2015-12-27 DIAGNOSIS — F1021 Alcohol dependence, in remission: Secondary | ICD-10-CM | POA: Diagnosis not present

## 2015-12-27 DIAGNOSIS — F418 Other specified anxiety disorders: Secondary | ICD-10-CM | POA: Diagnosis not present

## 2015-12-27 DIAGNOSIS — T3991XA Poisoning by unspecified nonopioid analgesic, antipyretic and antirheumatic, accidental (unintentional), initial encounter: Secondary | ICD-10-CM

## 2015-12-27 DIAGNOSIS — G8929 Other chronic pain: Secondary | ICD-10-CM | POA: Diagnosis not present

## 2015-12-27 DIAGNOSIS — I4891 Unspecified atrial fibrillation: Secondary | ICD-10-CM | POA: Insufficient documentation

## 2015-12-27 DIAGNOSIS — M546 Pain in thoracic spine: Secondary | ICD-10-CM | POA: Insufficient documentation

## 2015-12-27 DIAGNOSIS — M7918 Myalgia, other site: Secondary | ICD-10-CM

## 2015-12-27 DIAGNOSIS — T39395A Adverse effect of other nonsteroidal anti-inflammatory drugs [NSAID], initial encounter: Secondary | ICD-10-CM

## 2015-12-27 DIAGNOSIS — Z79891 Long term (current) use of opiate analgesic: Secondary | ICD-10-CM | POA: Diagnosis not present

## 2015-12-27 DIAGNOSIS — M47816 Spondylosis without myelopathy or radiculopathy, lumbar region: Secondary | ICD-10-CM

## 2015-12-27 DIAGNOSIS — M5030 Other cervical disc degeneration, unspecified cervical region: Secondary | ICD-10-CM | POA: Insufficient documentation

## 2015-12-27 DIAGNOSIS — F17200 Nicotine dependence, unspecified, uncomplicated: Secondary | ICD-10-CM | POA: Insufficient documentation

## 2015-12-27 DIAGNOSIS — T402X5A Adverse effect of other opioids, initial encounter: Secondary | ICD-10-CM

## 2015-12-27 DIAGNOSIS — K5903 Drug induced constipation: Secondary | ICD-10-CM | POA: Diagnosis not present

## 2015-12-27 DIAGNOSIS — Z5181 Encounter for therapeutic drug level monitoring: Secondary | ICD-10-CM | POA: Diagnosis not present

## 2015-12-27 DIAGNOSIS — M549 Dorsalgia, unspecified: Secondary | ICD-10-CM | POA: Diagnosis present

## 2015-12-27 MED ORDER — METHOCARBAMOL 750 MG PO TABS: 750.0000 mg | ORAL_TABLET | Freq: Three times a day (TID) | ORAL | Status: DC | PRN

## 2015-12-27 MED ORDER — PREGABALIN 150 MG PO CAPS: 150.0000 mg | ORAL_CAPSULE | Freq: Two times a day (BID) | ORAL | Status: DC

## 2015-12-27 MED ORDER — OXYCODONE-ACETAMINOPHEN 5-325 MG PO TABS: 1.0000 | ORAL_TABLET | Freq: Four times a day (QID) | ORAL | Status: DC | PRN

## 2015-12-27 MED ORDER — OXYCODONE-ACETAMINOPHEN 5-325 MG PO TABS
1.0000 | ORAL_TABLET | Freq: Four times a day (QID) | ORAL | Status: DC | PRN
Start: 2015-12-27 — End: 2016-04-01

## 2015-12-27 MED ORDER — DOCUSATE SODIUM 100 MG PO CAPS: 100.0000 mg | ORAL_CAPSULE | Freq: Every day | ORAL | Status: DC

## 2015-12-27 MED ORDER — MAGNESIUM OXIDE 400 MG PO TABS: 400.0000 mg | ORAL_TABLET | Freq: Every day | ORAL | Status: DC

## 2015-12-27 NOTE — Patient Instructions (Signed)
GENERAL RISKS AND COMPLICATIONS  What are the risk, side effects and possible complications? Generally speaking, most procedures are safe.  However, with any procedure there are risks, side effects, and the possibility of complications.  The risks and complications are dependent upon the sites that are lesioned, or the type of nerve block to be performed.  The closer the procedure is to the spine, the more serious the risks are.  Great care is taken when placing the radio frequency needles, block needles or lesioning probes, but sometimes complications can occur. 1. Infection: Any time there is an injection through the skin, there is a risk of infection.  This is why sterile conditions are used for these blocks.  There are four possible types of infection. 1. Localized skin infection. 2. Central Nervous System Infection-This can be in the form of Meningitis, which can be deadly. 3. Epidural Infections-This can be in the form of an epidural abscess, which can cause pressure inside of the spine, causing compression of the spinal cord with subsequent paralysis. This would require an emergency surgery to decompress, and there are no guarantees that the patient would recover from the paralysis. 4. Discitis-This is an infection of the intervertebral discs.  It occurs in about 1% of discography procedures.  It is difficult to treat and it may lead to surgery.        2. Pain: the needles have to go through skin and soft tissues, will cause soreness.       3. Damage to internal structures:  The nerves to be lesioned may be near blood vessels or    other nerves which can be potentially damaged.       4. Bleeding: Bleeding is more common if the patient is taking blood thinners such as  aspirin, Coumadin, Ticiid, Plavix, etc., or if he/she have some genetic predisposition  such as hemophilia. Bleeding into the spinal canal can cause compression of the spinal  cord with subsequent paralysis.  This would require an  emergency surgery to  decompress and there are no guarantees that the patient would recover from the  paralysis.       5. Pneumothorax:  Puncturing of a lung is a possibility, every time a needle is introduced in  the area of the chest or upper back.  Pneumothorax refers to free air around the  collapsed lung(s), inside of the thoracic cavity (chest cavity).  Another two possible  complications related to a similar event would include: Hemothorax and Chylothorax.   These are variations of the Pneumothorax, where instead of air around the collapsed  lung(s), you may have blood or chyle, respectively.       6. Spinal headaches: They may occur with any procedures in the area of the spine.       7. Persistent CSF (Cerebro-Spinal Fluid) leakage: This is a rare problem, but may occur  with prolonged intrathecal or epidural catheters either due to the formation of a fistulous  track or a dural tear.       8. Nerve damage: By working so close to the spinal cord, there is always a possibility of  nerve damage, which could be as serious as a permanent spinal cord injury with  paralysis.       9. Death:  Although rare, severe deadly allergic reactions known as "Anaphylactic  reaction" can occur to any of the medications used.      10. Worsening of the symptoms:  We can always make thing worse.  What are the chances of something like this happening? Chances of any of this occuring are extremely low.  By statistics, you have more of a chance of getting killed in a motor vehicle accident: while driving to the hospital than any of the above occurring .  Nevertheless, you should be aware that they are possibilities.  In general, it is similar to taking a shower.  Everybody knows that you can slip, hit your head and get killed.  Does that mean that you should not shower again?  Nevertheless always keep in mind that statistics do not mean anything if you happen to be on the wrong side of them.  Even if a procedure has a 1  (one) in a 1,000,000 (million) chance of going wrong, it you happen to be that one..Also, keep in mind that by statistics, you have more of a chance of having something go wrong when taking medications.  Who should not have this procedure? If you are on a blood thinning medication (e.g. Coumadin, Plavix, see list of "Blood Thinners"), or if you have an active infection going on, you should not have the procedure.  If you are taking any blood thinners, please inform your physician.  How should I prepare for this procedure?  Do not eat or drink anything at least six hours prior to the procedure.  Bring a driver with you .  It cannot be a taxi.  Come accompanied by an adult that can drive you back, and that is strong enough to help you if your legs get weak or numb from the local anesthetic.  Take all of your medicines the morning of the procedure with just enough water to swallow them.  If you have diabetes, make sure that you are scheduled to have your procedure done first thing in the morning, whenever possible.  If you have diabetes, take only half of your insulin dose and notify our nurse that you have done so as soon as you arrive at the clinic.  If you are diabetic, but only take blood sugar pills (oral hypoglycemic), then do not take them on the morning of your procedure.  You may take them after you have had the procedure.  Do not take aspirin or any aspirin-containing medications, at least eleven (11) days prior to the procedure.  They may prolong bleeding.  Wear loose fitting clothing that may be easy to take off and that you would not mind if it got stained with Betadine or blood.  Do not wear any jewelry or perfume  Remove any nail coloring.  It will interfere with some of our monitoring equipment.  NOTE: Remember that this is not meant to be interpreted as a complete list of all possible complications.  Unforeseen problems may occur.  BLOOD THINNERS The following drugs  contain aspirin or other products, which can cause increased bleeding during surgery and should not be taken for 2 weeks prior to and 1 week after surgery.  If you should need take something for relief of minor pain, you may take acetaminophen which is found in Tylenol,m Datril, Anacin-3 and Panadol. It is not blood thinner. The products listed below are.  Do not take any of the products listed below in addition to any listed on your instruction sheet.  A.P.C or A.P.C with Codeine Codeine Phosphate Capsules #3 Ibuprofen Ridaura  ABC compound Congesprin Imuran rimadil  Advil Cope Indocin Robaxisal  Alka-Seltzer Effervescent Pain Reliever and Antacid Coricidin or Coricidin-D  Indomethacin Rufen  Alka-Seltzer plus Cold Medicine Cosprin Ketoprofen S-A-C Tablets  Anacin Analgesic Tablets or Capsules Coumadin Korlgesic Salflex  Anacin Extra Strength Analgesic tablets or capsules CP-2 Tablets Lanoril Salicylate  Anaprox Cuprimine Capsules Levenox Salocol  Anexsia-D Dalteparin Magan Salsalate  Anodynos Darvon compound Magnesium Salicylate Sine-off  Ansaid Dasin Capsules Magsal Sodium Salicylate  Anturane Depen Capsules Marnal Soma  APF Arthritis pain formula Dewitt's Pills Measurin Stanback  Argesic Dia-Gesic Meclofenamic Sulfinpyrazone  Arthritis Bayer Timed Release Aspirin Diclofenac Meclomen Sulindac  Arthritis pain formula Anacin Dicumarol Medipren Supac  Analgesic (Safety coated) Arthralgen Diffunasal Mefanamic Suprofen  Arthritis Strength Bufferin Dihydrocodeine Mepro Compound Suprol  Arthropan liquid Dopirydamole Methcarbomol with Aspirin Synalgos  ASA tablets/Enseals Disalcid Micrainin Tagament  Ascriptin Doan's Midol Talwin  Ascriptin A/D Dolene Mobidin Tanderil  Ascriptin Extra Strength Dolobid Moblgesic Ticlid  Ascriptin with Codeine Doloprin or Doloprin with Codeine Momentum Tolectin  Asperbuf Duoprin Mono-gesic Trendar  Aspergum Duradyne Motrin or Motrin IB Triminicin  Aspirin  plain, buffered or enteric coated Durasal Myochrisine Trigesic  Aspirin Suppositories Easprin Nalfon Trillsate  Aspirin with Codeine Ecotrin Regular or Extra Strength Naprosyn Uracel  Atromid-S Efficin Naproxen Ursinus  Auranofin Capsules Elmiron Neocylate Vanquish  Axotal Emagrin Norgesic Verin  Azathioprine Empirin or Empirin with Codeine Normiflo Vitamin E  Azolid Emprazil Nuprin Voltaren  Bayer Aspirin plain, buffered or children's or timed BC Tablets or powders Encaprin Orgaran Warfarin Sodium  Buff-a-Comp Enoxaparin Orudis Zorpin  Buff-a-Comp with Codeine Equegesic Os-Cal-Gesic   Buffaprin Excedrin plain, buffered or Extra Strength Oxalid   Bufferin Arthritis Strength Feldene Oxphenbutazone   Bufferin plain or Extra Strength Feldene Capsules Oxycodone with Aspirin   Bufferin with Codeine Fenoprofen Fenoprofen Pabalate or Pabalate-SF   Buffets II Flogesic Panagesic   Buffinol plain or Extra Strength Florinal or Florinal with Codeine Panwarfarin   Buf-Tabs Flurbiprofen Penicillamine   Butalbital Compound Four-way cold tablets Penicillin   Butazolidin Fragmin Pepto-Bismol   Carbenicillin Geminisyn Percodan   Carna Arthritis Reliever Geopen Persantine   Carprofen Gold's salt Persistin   Chloramphenicol Goody's Phenylbutazone   Chloromycetin Haltrain Piroxlcam   Clmetidine heparin Plaquenil   Cllnoril Hyco-pap Ponstel   Clofibrate Hydroxy chloroquine Propoxyphen         Before stopping any of these medications, be sure to consult the physician who ordered them.  Some, such as Coumadin (Warfarin) are ordered to prevent or treat serious conditions such as "deep thrombosis", "pumonary embolisms", and other heart problems.  The amount of time that you may need off of the medication may also vary with the medication and the reason for which you were taking it.  If you are taking any of these medications, please make sure you notify your pain physician before you undergo any  procedures.         Epidural Steroid Injection Patient Information  Description: The epidural space surrounds the nerves as they exit the spinal cord.  In some patients, the nerves can be compressed and inflamed by a bulging disc or a tight spinal canal (spinal stenosis).  By injecting steroids into the epidural space, we can bring irritated nerves into direct contact with a potentially helpful medication.  These steroids act directly on the irritated nerves and can reduce swelling and inflammation which often leads to decreased pain.  Epidural steroids may be injected anywhere along the spine and from the neck to the low back depending upon the location of your pain.   After numbing the skin with local anesthetic (like Novocaine), a small needle is passed  into the epidural space slowly.  You may experience a sensation of pressure while this is being done.  The entire block usually last less than 10 minutes.  Conditions which may be treated by epidural steroids:   Low back and leg pain  Neck and arm pain  Spinal stenosis  Post-laminectomy syndrome  Herpes zoster (shingles) pain  Pain from compression fractures  Preparation for the injection:  1. Do not eat any solid food or dairy products within 8 hours of your appointment.  2. You may drink clear liquids up to 3 hours before appointment.  Clear liquids include water, black coffee, juice or soda.  No milk or cream please. 3. You may take your regular medication, including pain medications, with a sip of water before your appointment  Diabetics should hold regular insulin (if taken separately) and take 1/2 normal NPH dos the morning of the procedure.  Carry some sugar containing items with you to your appointment. 4. A driver must accompany you and be prepared to drive you home after your procedure.  5. Bring all your current medications with your. 6. An IV may be inserted and sedation may be given at the discretion of the  physician.   7. A blood pressure cuff, EKG and other monitors will often be applied during the procedure.  Some patients may need to have extra oxygen administered for a short period. 8. You will be asked to provide medical information, including your allergies, prior to the procedure.  We must know immediately if you are taking blood thinners (like Coumadin/Warfarin)  Or if you are allergic to IV iodine contrast (dye). We must know if you could possible be pregnant.  Possible side-effects:  Bleeding from needle site  Infection (rare, may require surgery)  Nerve injury (rare)  Numbness & tingling (temporary)  Difficulty urinating (rare, temporary)  Spinal headache ( a headache worse with upright posture)  Light -headedness (temporary)  Pain at injection site (several days)  Decreased blood pressure (temporary)  Weakness in arm/leg (temporary)  Pressure sensation in back/neck (temporary)  Call if you experience:  Fever/chills associated with headache or increased back/neck pain.  Headache worsened by an upright position.  New onset weakness or numbness of an extremity below the injection site  Hives or difficulty breathing (go to the emergency room)  Inflammation or drainage at the infection site  Severe back/neck pain  Any new symptoms which are concerning to you  Please note:  Although the local anesthetic injected can often make your back or neck feel good for several hours after the injection, the pain will likely return.  It takes 3-7 days for steroids to work in the epidural space.  You may not notice any pain relief for at least that one week.  If effective, we will often do a series of three injections spaced 3-6 weeks apart to maximally decrease your pain.  After the initial series, we generally will wait several months before considering a repeat injection of the same type.  If you have any questions, please call [phone] [facility]

## 2015-12-27 NOTE — Progress Notes (Signed)
Patient's Name: Rick MOQUIN Sr. MRN: [id number] DOB: 1951/09/07 DOS: 12/27/2015  Primary Reason(s) for Visit: Post-Procedure evaluation and pharmacological management of his pain CC: Back Pain and Neck Pain   HPI  Rick Carter is a 65 y.o. year old, male patient, who returns today as an established patient. 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[place]); Atrial fibrillation ([place]); Bipolar affective disorder ([place]); Chronic obstructive pulmonary disease ([place]); Degeneration of intervertebral disc of cervical region; Colon, diverticulosis; Acid reflux; Essential (primary) hypertension; HLD (hyperlipidemia); Breath shortness; Compulsive tobacco user syndrome; Diverticular disease of large intestine; Post-thoracotomy pain syndrome; Encounter for therapeutic drug level monitoring; Long term current use of opiate analgesic; Long term prescription opiate use; Uncomplicated opioid dependence ([place]); Opiate use; Fibromyalgia; Chronic pain syndrome; Chronic pain; Lumbar facet syndrome (R>L); History of alcoholism ([place]); Chronic low back pain; Lumbar spondylosis; Cervical spondylosis; Failed cervical surgery syndrome; Nicotine dependence; Tobacco abuse; Substance Use Disorder Risk: High; History of right-sided partial pneumonectomy; Atherosclerotic peripheral vascular disease ([place]); Musculoskeletal pain; Myofascial pain; Neurogenic pain; Neuropathic pain; Opioid-induced constipation (OIC); Osteoarthritis; NSAID induced gastritis; Trigger finger of right hand (middle finger); Thoracic spine pain; and Radicular pain of thoracic region on his problem list.. His primarily concern today is the Back Pain and Neck Pain   The patient returns to the clinics today after having had a thoracic epidural steroid injection under fluoroscopic guidance, without sedation. He indicates that this provided him with almost 80% relief of his pain. He comes in today for pharmacological management of his chronic pain.  Pain  Assessment: Self-Reported Pain Score: 6  Reported level is compatible with observation Pain Type: Chronic pain Pain Location: Back Pain Orientation: Lower Pain Descriptors / Indicators: Aching, Constant Pain Frequency: Constant  Date of Last Visit: 12/12/15 Service Provided on Last Visit: Procedure (thoracic epidural steriod injection)  Controlled Substance Pharmacotherapy Assessment  Analgesic: Oxycodone/APAP 5/325 one every 6 hours (20 mg/day) MME/day: 30 mg/day Pharmacokinetics: Onset of action (Liberation/Absorption): Within expected pharmacological parameters Time to Peak effect (Distribution): Timing and results are as within normal expected parameters Duration of action (Metabolism/Excretion): Within normal limits for medication Pharmacodynamics: Analgesic Effect: More than 50% Activity Facilitation: Medication(s) allow patient to sit, stand, walk, and do the basic ADLs Perceived Effectiveness: Described as relatively effective, allowing for increase in activities of daily living (ADL) Side-effects or Adverse reactions: None reported Monitoring: [place] PMP: Compliant with practice rules and regulations UDS Results/interpretation: The patient's last UDS was on done on 10/31/2015 and it came back within normal limits. Medication Assessment Form: Reviewed. Patient indicates being compliant with therapy Treatment compliance: Compliant Risk Assessment: Aberrant Behavior: None observed today Substance Use Disorder (SUD) Risk Level: Low Opioid Risk Tool (ORT) Score: Total Score: 7 Moderate Risk for SUD (Score between 4-7) Depression Scale Score: PHQ-2: PHQ-2 Total Score: 0 No depression (0) PHQ-9: PHQ-9 Total Score: 0 No depression (0-4)  Pharmacologic Plan: No change in therapy, at this time   Laboratory Workup  Last ED UDS: No results found for: THCU, COCAINSCRNUR, PCPSCRNUR, MDMA, AMPHETMU, METHADONE, ETOH  Inflammation Markers Lab Results  Component Value Date    ESRSEDRATE 1 10/10/2015   CRP 0.8 10/10/2015    Renal Function Lab Results  Component Value Date   BUN 13 10/10/2015   CREATININE 0.82 10/10/2015   GFRAA >60 10/10/2015   GFRNONAA >60 10/10/2015    Hepatic Function Lab Results  Component Value Date   AST 21 10/10/2015   ALT 16* 10/10/2015   ALBUMIN 4.5 10/10/2015  Electrolytes Lab Results  Component Value Date   NA 136 10/10/2015   K 3.5 10/10/2015   CL 98* 10/10/2015   CALCIUM 9.8 10/10/2015   MG 2.3 10/10/2015    Post-Procedure Assessment  Procedure done on last visit: T6-7 interlaminar thoracic epidural steroid injection under fluoroscopic guidance, without IV sedation. Side-effects or Adverse reactions: None reported Sedation: No sedation used  Results: Ultra-Short Term Relief (First 1 hour after procedure): 20 %  Analgesia during this period is likely to be Local Anesthetic and/or IV Sedative (Analgesic/Anxiolitic) related Short Term Relief (Initial 4-6 hrs after procedure): 20 % Complete relief confirms area to be the source of pain Long Term Relief : 80 % Long-term benefit would suggest an inflammatory etiology to the pain   Current Relief (Now):  80 %  Persistent relief would suggest effective anti-inflammatory effects from steroids Interpretation of Results: Clearly this didn't work for him in relieving some of this thoracic pain and thoracic radiculitis. He no longer has the radiculitis and he indicates that now he can raise his arms without having the chest pain. This would confirm that the symptoms were off intraspinal origin as opposed to cardiovascular.  Allergies  Mr. Koral is allergic to varenicline.  Meds  The patient has a current medication list which includes the following prescription(s): aclidinium bromide, albuterol sulfate, aspirin ec, cholecalciferol, diphenhydramine, docusate sodium, fluticasone-salmeterol, hydrochlorothiazide, ibuprofen, magnesium oxide, metformin, methocarbamol,  multiple vitamins-minerals, oxycodone-acetaminophen, oxycodone-acetaminophen, oxycodone-acetaminophen, pantoprazole, pregabalin, and trazodone.  Current Outpatient Prescriptions on File Prior to Visit  Medication Sig  . Aclidinium Bromide (TUDORZA PRESSAIR IN) Inhale 40 mcg into the lungs 2 (two) times daily.  . Albuterol Sulfate (VENTOLIN HFA IN) Inhale 1 puff into the lungs.  Marland Kitchen aspirin EC 81 MG tablet Take 81 mg by mouth daily.  . cholecalciferol (VITAMIN D) 1000 UNITS tablet Take 2,000 Units by mouth daily.  . diphenhydrAMINE (BENADRYL) 25 MG tablet Take 25 mg by mouth at bedtime as needed for allergies.  . Fluticasone-Salmeterol (ADVAIR) 500-50 MCG/DOSE AEPB Inhale 1 puff into the lungs 2 (two) times daily.  . hydrochlorothiazide (HYDRODIURIL) 25 MG tablet Take 25 mg by mouth daily.  Marland Kitchen ibuprofen (ADVIL,MOTRIN) 800 MG tablet Take 1 tablet (800 mg total) by mouth every 8 (eight) hours as needed for moderate pain.  . metFORMIN (GLUCOPHAGE) 500 MG tablet Take 500 mg by mouth 2 (two) times daily with a meal.  . pantoprazole (PROTONIX) 40 MG tablet Take 1 tablet (40 mg total) by mouth 2 (two) times daily.  . traZODone (DESYREL) 50 MG tablet Take 50 mg by mouth at bedtime.   No current facility-administered medications on file prior to visit.    ROS  Constitutional: Afebrile, no chills, well hydrated and well nourished Gastrointestinal: negative Musculoskeletal:negative Neurological: negative Behavioral/Psych: negative  PFSH  Medical:  Mr. Polinsky  has a past medical history of Depression; Anxiety; COPD (chronic obstructive pulmonary disease) ([place]); Hypertension; History of alcoholism ([place]) (08/08/2015); and History of pneumonectomy (08/08/2015). Family: family history includes Alcohol abuse in his father; Kidney disease in his mother. Surgical:  has past surgical history that includes Tonsillectomy; Lung removal, partial; and Neck surgery. Tobacco:  reports that he has been smoking  Cigarettes.  He has a 51 pack-year smoking history. He does not have any smokeless tobacco history on file. Alcohol:  reports that he does not drink alcohol. Drug:  has no drug history on file.  Physical Exam  Vitals:  Today's Vitals   12/27/15 1037  BP: 175/81  Pulse: 63  Temp: 97.9 F (36.6 C)  TempSrc: Oral  Resp: 16  Height: _0  (1.753 m)  Weight: 154 lb (69.854 kg)  SpO2: 97%  PainSc: 6   PainLoc: Back    Calculated BMI: Body mass index is 22.73 kg/(m^2).     General appearance: alert, cooperative, appears stated age and no distress Eyes: PERLA Respiratory: No evidence respiratory distress, no audible rales or ronchi and no use of accessory muscles of respiration  Cervical Spine Inspection: Normal anatomy Alignment: Symetrical ROM: Adequate  Upper Extremities Inspection: No gross anomalies detected ROM: Adequate Sensory: Normal Motor: Unremarkable  Thoracic Spine Inspection: No gross anomalies detected Alignment: Symetrical ROM: Adequate  Lumbar Spine Inspection: No gross anomalies detected Alignment: Symetrical ROM: Decreased  Gait: WNL  Lower Extremities Inspection: No gross anomalies detected ROM: Adequate Sensory:  Normal Motor: Unremarkable  Assessment & Plan  Primary Diagnosis & Pertinent Problem List: The primary encounter diagnosis was Chronic pain. Diagnoses of Encounter for therapeutic drug level monitoring, Long term current use of opiate analgesic, Chronic low back pain, Musculoskeletal pain, Fibromyalgia, NSAID induced gastritis, Opioid-induced constipation (OIC), and Lumbar facet syndrome (R>L) were also pertinent to this visit.  Visit Diagnosis: 1. Chronic pain   2. Encounter for therapeutic drug level monitoring   3. Long term current use of opiate analgesic   4. Chronic low back pain   5. Musculoskeletal pain   6. Fibromyalgia   7. NSAID induced gastritis   8. Opioid-induced constipation (OIC)   9. Lumbar facet syndrome  (R>L)     Problem-specific Plan(s): No problem-specific assessment & plan notes found for this encounter.   Plan of Care  Pharmacotherapy (Medications Ordered): Meds ordered this encounter  Medications  . oxyCODONE-acetaminophen (PERCOCET/ROXICET) 5-325 MG tablet    Sig: Take 1 tablet by mouth every 6 (six) hours as needed for moderate pain or severe pain.    Dispense:  120 tablet    Refill:  0    Do not place this medication, or any other prescription from our practice, on "Automatic Refill". Patient may have prescription filled one day early if pharmacy is closed on scheduled refill date. Do not fill until: 01/04/16 To last until: 02/03/16  . oxyCODONE-acetaminophen (PERCOCET/ROXICET) 5-325 MG tablet    Sig: Take 1 tablet by mouth every 6 (six) hours as needed for moderate pain or severe pain.    Dispense:  120 tablet    Refill:  0    Do not place this medication, or any other prescription from our practice, on "Automatic Refill". Patient may have prescription filled one day early if pharmacy is closed on scheduled refill date. Do not fill until: 02/03/16 To last until: 03/04/16  . oxyCODONE-acetaminophen (PERCOCET/ROXICET) 5-325 MG tablet    Sig: Take 1 tablet by mouth every 6 (six) hours as needed for moderate pain or severe pain.    Dispense:  120 tablet    Refill:  0    Do not place this medication, or any other prescription from our practice, on "Automatic Refill". Patient may have prescription filled one day early if pharmacy is closed on scheduled refill date. Do not fill until: 03/04/16 To last until: 04/03/16  . methocarbamol (ROBAXIN) 750 MG tablet    Sig: Take 1 tablet (750 mg total) by mouth every 8 (eight) hours as needed for muscle spasms.    Dispense:  90 tablet    Refill:  2    Do not place this medication, or any other  prescription from our practice, on "Automatic Refill". Patient may have prescription filled one day early if pharmacy is closed on scheduled  refill date.  . pregabalin (LYRICA) 150 MG capsule    Sig: Take 1 capsule (150 mg total) by mouth 2 (two) times daily.    Dispense:  60 capsule    Refill:  2    Do not place this medication, or any other prescription from our practice, on "Automatic Refill". Patient may have prescription filled one day early if pharmacy is closed on scheduled refill date. Do not fill until: 10/02/15 To last until: 12/28/15  . magnesium oxide (MAG-OX) 400 MG tablet    Sig: Take 1 tablet (400 mg total) by mouth daily.    Dispense:  30 tablet    Refill:  2    Do not place this medication, or any other prescription from our practice, on "Automatic Refill". Patient may have prescription filled one day early if pharmacy is closed on scheduled refill date.  . docusate sodium (COLACE) 100 MG capsule    Sig: Take 1 capsule (100 mg total) by mouth daily.    Dispense:  30 capsule    Refill:  2    Do not place this medication, or any other prescription from our practice, on "Automatic Refill". Patient may have prescription filled one day early if pharmacy is closed on scheduled refill date.    Lab-work & Procedure Ordered: Orders Placed This Encounter  Procedures  . LUMBAR FACET(MEDIAL BRANCH NERVE BLOCK) MBNB    Standing Status: Standing     Number of Occurrences: 1     Standing Expiration Date: 12/26/2016    Scheduling Instructions:     Side: Bilateral     Level: L2, L3, L4, L5, & S1 Medial Branch Nerve     Sedation: With Sedation.     Timeframe: PRN Procedure. Patient will call to schedule.    Order Specific Question:  Where will this procedure be performed?    Answer:  [facility]  . ToxASSURE Select 13 (MW), Urine    Volume: 30 ml(s). Minimum 3 ml of urine is needed. Document temperature of fresh sample. Indications: Long term (current) use of opiate analgesic (O03.704)    Imaging Ordered: None  Interventional Therapies: Scheduled: None at this time. PRN Procedures: Right-sided lumbar  facet block under fluoroscopic guidance and IV sedation. The last one was done on 07/23/2015 and it provided him with 100% relief of the pain for the duration of local anesthetic followed by an 80% improvement in his low back pain which has lasted until recently. (5 months)    Referral(s) or Consult(s): None at this time.  Medications administered during this visit: Mr. Ehly had no medications administered during this visit.  Future Appointments Date Time Provider [place]  01/10/2016 10:00 AM [facility]  04/01/2016 1:20 PM Milinda Pointer, MD [facility] None    Primary Care Physician: No primary care provider on file. Location: [facility] Note by: Shaunta Oncale A. Dossie Arbour, M.D, DABA, DABAPM, DABPM, DABIPP, FIPP  Pain Score Disclaimer: We use the NRS-11 scale. This is a self-reported, subjective measurement of pain severity with only modest accuracy. It is used primarily to identify changes within a particular patient. It must be understood that outpatient pain scales are significantly less accurate that those used for research, where they can be applied under ideal controlled circumstances with minimal exposure to variables. In reality, the score is likely to be a  combination of pain intensity and pain affect, where pain affect describes the degree of emotional arousal or changes in action readiness caused by the sensory experience of pain. Factors such as social and work situation, setting, emotional state, anxiety levels, expectation, and prior pain experience may influence pain perception and show large inter-individual differences that may also be affected by time variables.

## 2015-12-27 NOTE — Progress Notes (Signed)
Had a panic attack around 12/13/2015, went to the ED at [facility]; was given an inhaler and released. Pill Count: Oxycodone-Acet 5/325 mg # 79/499 filled 12/09/15

## 2016-01-01 LAB — TOXASSURE SELECT 13 (MW), URINE: PDF: 0

## 2016-01-10 ENCOUNTER — Ambulatory Visit
Admission: RE | Admit: 2016-01-10 | Discharge: 2016-01-10 | Disposition: A | Discharge status: Home / Self Care | Payer: Medicare Other | Source: Ambulatory Visit | Attending: Pain Medicine | Admitting: Pain Medicine

## 2016-01-10 DIAGNOSIS — M5410 Radiculopathy, site unspecified: Secondary | ICD-10-CM | POA: Diagnosis present

## 2016-01-10 DIAGNOSIS — M5124 Other intervertebral disc displacement, thoracic region: Secondary | ICD-10-CM | POA: Insufficient documentation

## 2016-01-10 DIAGNOSIS — M5414 Radiculopathy, thoracic region: Secondary | ICD-10-CM

## 2016-01-17 NOTE — Progress Notes (Signed)
Quick Note:  The results of this diagnostic imaging were reviewed and found to be mildly abnormal. No acute injury or pathology identified. Consider interventional pain management techniques. The patient may benefit from thoracic epidural steroid injection under fluoroscopic guidance. ______

## 2016-02-20 ENCOUNTER — Ambulatory Visit: Payer: Medicare Other | Attending: Pain Medicine | Admitting: Pain Medicine

## 2016-02-20 VITALS — BP 179/84 | HR 60 | Temp 98.1°F | Resp 16 | Ht 69.0 in | Wt 158.0 lb

## 2016-02-20 DIAGNOSIS — M546 Pain in thoracic spine: Secondary | ICD-10-CM | POA: Diagnosis not present

## 2016-02-20 DIAGNOSIS — I739 Peripheral vascular disease, unspecified: Secondary | ICD-10-CM | POA: Diagnosis not present

## 2016-02-20 DIAGNOSIS — I10 Essential (primary) hypertension: Secondary | ICD-10-CM | POA: Insufficient documentation

## 2016-02-20 DIAGNOSIS — F1021 Alcohol dependence, in remission: Secondary | ICD-10-CM | POA: Diagnosis not present

## 2016-02-20 DIAGNOSIS — M5126 Other intervertebral disc displacement, lumbar region: Secondary | ICD-10-CM | POA: Diagnosis not present

## 2016-02-20 DIAGNOSIS — F319 Bipolar disorder, unspecified: Secondary | ICD-10-CM | POA: Diagnosis not present

## 2016-02-20 DIAGNOSIS — M5414 Radiculopathy, thoracic region: Secondary | ICD-10-CM | POA: Diagnosis not present

## 2016-02-20 DIAGNOSIS — I70209 Unspecified atherosclerosis of native arteries of extremities, unspecified extremity: Secondary | ICD-10-CM | POA: Diagnosis not present

## 2016-02-20 DIAGNOSIS — G8929 Other chronic pain: Secondary | ICD-10-CM | POA: Insufficient documentation

## 2016-02-20 DIAGNOSIS — E785 Hyperlipidemia, unspecified: Secondary | ICD-10-CM | POA: Insufficient documentation

## 2016-02-20 DIAGNOSIS — K2960 Other gastritis without bleeding: Secondary | ICD-10-CM | POA: Diagnosis not present

## 2016-02-20 DIAGNOSIS — Z902 Acquired absence of lung [part of]: Secondary | ICD-10-CM | POA: Insufficient documentation

## 2016-02-20 DIAGNOSIS — M47816 Spondylosis without myelopathy or radiculopathy, lumbar region: Secondary | ICD-10-CM | POA: Insufficient documentation

## 2016-02-20 DIAGNOSIS — K5903 Drug induced constipation: Secondary | ICD-10-CM | POA: Diagnosis not present

## 2016-02-20 DIAGNOSIS — M1288 Other specific arthropathies, not elsewhere classified, other specified site: Secondary | ICD-10-CM | POA: Diagnosis not present

## 2016-02-20 DIAGNOSIS — M47812 Spondylosis without myelopathy or radiculopathy, cervical region: Secondary | ICD-10-CM | POA: Insufficient documentation

## 2016-02-20 DIAGNOSIS — Z79891 Long term (current) use of opiate analgesic: Secondary | ICD-10-CM | POA: Insufficient documentation

## 2016-02-20 DIAGNOSIS — K219 Gastro-esophageal reflux disease without esophagitis: Secondary | ICD-10-CM | POA: Insufficient documentation

## 2016-02-20 DIAGNOSIS — M797 Fibromyalgia: Secondary | ICD-10-CM | POA: Insufficient documentation

## 2016-02-20 DIAGNOSIS — M5416 Radiculopathy, lumbar region: Secondary | ICD-10-CM | POA: Diagnosis not present

## 2016-02-20 DIAGNOSIS — J449 Chronic obstructive pulmonary disease, unspecified: Secondary | ICD-10-CM | POA: Insufficient documentation

## 2016-02-20 DIAGNOSIS — M545 Low back pain, unspecified: Secondary | ICD-10-CM

## 2016-02-20 DIAGNOSIS — M5030 Other cervical disc degeneration, unspecified cervical region: Secondary | ICD-10-CM | POA: Insufficient documentation

## 2016-02-20 DIAGNOSIS — M65331 Trigger finger, right middle finger: Secondary | ICD-10-CM | POA: Insufficient documentation

## 2016-02-20 DIAGNOSIS — M4806 Spinal stenosis, lumbar region: Secondary | ICD-10-CM | POA: Diagnosis not present

## 2016-02-20 DIAGNOSIS — K5730 Diverticulosis of large intestine without perforation or abscess without bleeding: Secondary | ICD-10-CM | POA: Diagnosis not present

## 2016-02-20 DIAGNOSIS — I4891 Unspecified atrial fibrillation: Secondary | ICD-10-CM | POA: Diagnosis not present

## 2016-02-20 DIAGNOSIS — F17200 Nicotine dependence, unspecified, uncomplicated: Secondary | ICD-10-CM | POA: Diagnosis not present

## 2016-02-20 DIAGNOSIS — M48061 Spinal stenosis, lumbar region without neurogenic claudication: Secondary | ICD-10-CM

## 2016-02-20 MED ORDER — TRIAMCINOLONE ACETONIDE 40 MG/ML IJ SUSP
INTRAMUSCULAR | Status: AC
  Filled 2016-02-20: qty 1

## 2016-02-20 MED ORDER — TRIAMCINOLONE ACETONIDE 40 MG/ML IJ SUSP
INTRAMUSCULAR | Status: AC
  Filled 2016-02-20: qty 2

## 2016-02-20 MED ORDER — IOPAMIDOL (ISOVUE-M 200) INJECTION 41%: 10.0000 mL | Freq: Once | INTRAMUSCULAR | Status: DC

## 2016-02-20 MED ORDER — LIDOCAINE HCL (PF) 1 % IJ SOLN
INTRAMUSCULAR | Status: AC
  Filled 2016-02-20: qty 5

## 2016-02-20 MED ORDER — ROPIVACAINE HCL 2 MG/ML IJ SOLN
INTRAMUSCULAR | Status: AC
  Filled 2016-02-20: qty 10

## 2016-02-20 MED ORDER — SODIUM CHLORIDE 0.9% FLUSH: 2.0000 mL | Freq: Once | INTRAVENOUS | Status: DC

## 2016-02-20 MED ORDER — LIDOCAINE HCL (PF) 1 % IJ SOLN: 10.0000 mL | Freq: Once | INTRAMUSCULAR | Status: DC

## 2016-02-20 MED ORDER — SODIUM CHLORIDE 0.9 % IJ SOLN
INTRAMUSCULAR | Status: AC
  Administered 2016-02-20: 09:00:00
  Filled 2016-02-20: qty 10

## 2016-02-20 MED ORDER — IOPAMIDOL (ISOVUE-M 200) INJECTION 41%
INTRAMUSCULAR | Status: AC
  Filled 2016-02-20: qty 10

## 2016-02-20 MED ORDER — ROPIVACAINE HCL 2 MG/ML IJ SOLN
INTRAMUSCULAR | Status: AC
  Administered 2016-02-20: 09:00:00
  Filled 2016-02-20: qty 20

## 2016-02-20 MED ORDER — TRIAMCINOLONE ACETONIDE 40 MG/ML IJ SUSP: 40.0000 mg | Freq: Once | INTRAMUSCULAR | Status: DC

## 2016-02-20 MED ORDER — TRIAMCINOLONE ACETONIDE 40 MG/ML IJ SUSP
INTRAMUSCULAR | Status: AC
  Administered 2016-02-20: 09:00:00
  Filled 2016-02-20: qty 1

## 2016-02-20 MED ORDER — IOPAMIDOL (ISOVUE-M 200) INJECTION 41%
INTRAMUSCULAR | Status: AC
  Administered 2016-02-20: 09:00:00
  Filled 2016-02-20: qty 10

## 2016-02-20 MED ORDER — LIDOCAINE HCL (PF) 1 % IJ SOLN
INTRAMUSCULAR | Status: AC
  Administered 2016-02-20: 09:00:00
  Filled 2016-02-20: qty 5

## 2016-02-20 MED ORDER — ROPIVACAINE HCL 2 MG/ML IJ SOLN: 2.0000 mL | Freq: Once | INTRAMUSCULAR | Status: DC

## 2016-02-20 NOTE — Progress Notes (Signed)
Patient's Name: Rick WARMUTH Sr.  Patient type: Established  MRN: [id number]  Service setting: Ambulatory outpatient  DOB: August 11, 1951  Location: [facility]  DOS: 02/20/2016  Primary Care Physician: Pcp Not In System  Note by: Vianca Bracher A. Dossie Arbour, M.D, DABA, DABAPM, DABPM, DABIPP, FIPP  Referring Physician: No ref. provider found  Specialty: Board-Certified Interventional Pain Management  Last Visit to Pain Management: 12/27/2015   Primary Reason(s) for Visit: Interventional Pain Management Treatment. CC: Back Pain  Primary Diagnosis: Chronic radicular lumbar pain [M54.16, G89.29]   Procedure:  Anesthesia, Analgesia, Anxiolysis:  Type: Therapeutic Inter-Laminar Epidural Steroid Injection Region: Lumbar Level: L5-S1 Level. Laterality: Right Paramedial  Indications: 1. Chronic lumbar radicular pain (L5/S1 dermatome) (Location of Secondary source of pain) (Right)   2. Chronic low back pain   3. Lumbar foraminal stenosis (L5-S1) (Right)   4. Lumbar facet arthropathy (Bilateral) (R>L)   5. Lumbar disc protrusion (L3-4) (Right)     Pre-procedure Pain Score: 7/10 Reported level of pain is compatible with clinical observations Post-procedure Pain Score: 7   Type: Local Anesthesia Local Anesthetic: Lidocaine 1% Route: Infiltration ([place]/IM) IV Access: Declined Sedation: Declined  Indication(s): Analgesia     Pre-Procedure Assessment:  Rick Carter is a 65 y.o. year old, male patient, seen today for interventional treatment. 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[place]); Atrial fibrillation ([place]); Bipolar affective disorder ([place]); Chronic obstructive pulmonary disease ([place]); Degeneration of intervertebral disc of cervical region; Colon, diverticulosis; Acid reflux; Essential (primary) hypertension; HLD (hyperlipidemia); Breath shortness; Compulsive tobacco user syndrome; Diverticular disease of large intestine; Post-thoracotomy pain syndrome; Encounter  for therapeutic drug level monitoring; Long term current use of opiate analgesic; Long term prescription opiate use; Uncomplicated opioid dependence ([place]); Opiate use; Fibromyalgia; Chronic pain syndrome; Chronic pain; Lumbar facet syndrome (Location of Primary Source of Pain) (Bilateral) (R>L); History of alcoholism ([place]); Chronic low back pain (Location of Primary Source of Pain) (midline); Lumbar spondylosis; Cervical spondylosis; Failed cervical surgery syndrome; Nicotine dependence; Tobacco abuse; Substance Use Disorder Risk: High; History of right-sided partial pneumonectomy; Atherosclerotic peripheral vascular disease ([place]); Musculoskeletal pain; Myofascial pain; Neurogenic pain; Neuropathic pain; Opioid-induced constipation ([place]); Osteoarthritis; NSAID induced gastritis; Trigger finger of right hand (middle finger); Thoracic spine pain; Radicular pain of thoracic region; Chronic lumbar radicular pain (L5/S1 dermatome) (Location of Secondary source of pain) (Right); Lumbar foraminal stenosis (L5-S1) (Right); Lumbar facet arthropathy (Bilateral) (R>L); and Lumbar disc protrusion (L3-4) (Right) on his problem list.. His primarily concern today is the Back Pain   Pain Type: Chronic pain Pain Location: Back Pain Orientation: Lower Pain Descriptors / Indicators: Aching, Constant, Radiating (stinging) Pain Frequency: Constant  Date of Last Visit: 12/27/15 Service Provided on Last Visit: Evaluation, Med Refill  Verification of the correct person, correct site (including marking of site), and correct procedure were performed and confirmed by the patient.  Consent: Secured. Under the influence of no sedatives a written informed consent was obtained, after having provided information on the risks and possible complications. To fulfill our ethical and legal obligations, as recommended by the American Medical Association's Code of Ethics, we have provided information to the patient about our clinical  impression; the nature and purpose of the treatment or procedure; the risks, benefits, and possible complications of the intervention; alternatives; the risk(s) and benefit(s) of the alternative treatment(s) or procedure(s); and the risk(s) and benefit(s) of doing nothing. The patient was provided information about the risks and possible complications associated with the procedure. These include, but are  not limited to, failure to achieve desired goals, infection, bleeding, organ or nerve damage, allergic reactions, paralysis, and death. In the case of spinal procedures these may include, but are not limited to, failure to achieve desired goals, infection, bleeding, organ or nerve damage, allergic reactions, paralysis, and death. In addition, the patient was informed that Medicine is not an exact science; therefore, there is also the possibility of unforeseen risks and possible complications that may result in a catastrophic outcome. The patient indicated having understood very clearly. We have given the patient no guarantees and we have made no promises. Enough time was given to the patient to ask questions, all of which were answered to the patient's satisfaction.  Consent Attestation: I, the ordering provider, attest that I have discussed with the patient the benefits, risks, side-effects, alternatives, likelihood of achieving goals, and potential problems during recovery for the procedure that I have provided informed consent.  Pre-Procedure Preparation: Safety Precautions: Allergies reviewed. Appropriate site, procedure, and patient were confirmed by following the Joint Commission's Universal Protocol (UP.01.01.01), in the form of a "Time Out". The patient was asked to confirm marked site and procedure, before commencing. The patient was asked about blood thinners, or active infections, both of which were denied. Patient was assessed for positional comfort and all pressure points were checked before  starting procedure. Allergies: He is allergic to varenicline.. Infection Control Precautions: Sterile technique used. Standard Universal Precautions were taken as recommended by the Department of [facility] for Disease Control and Prevention (CDC). Standard pre-surgical skin prep was conducted. Respiratory hygiene and cough etiquette was practiced. Hand hygiene observed. Safe injection practices and needle disposal techniques followed. SDV (single dose vial) medications used. Medications properly checked for expiration dates and contaminants. Personal protective equipment (PPE) used: Surgical mask. Sterile Radiation-resistant gloves. Monitoring:  As per clinic protocol. Filed Vitals:   02/20/16 0911 02/20/16 0916 02/20/16 0921 02/20/16 0923  BP: 179/75 176/78 172/79 179/84  Pulse: 62 59 61 60  Temp:      TempSrc:      Resp: _0 Height:      Weight:      SpO2: 99% 99% 98% 99%  Calculated BMI: Body mass index is 23.32 kg/(m^2).  Description of Procedure Process:  Time-out: "Time-out" completed before starting procedure, as per protocol. Position: Prone Target Area: For Epidural Steroid injections, the target area is the  interlaminar space, initially targeting the lower border of the superior vertebral body lamina. Approach: Posterior approach. Area Prepped: Entire Posterior Lumbosacral Region Prepping solution: ChloraPrep (2% chlorhexidine gluconate and 70% isopropyl alcohol) Safety Precautions: Aspiration looking for blood return was conducted prior to all injections. At no point did we inject any substances, as a needle was being advanced. No attempts were made at seeking any paresthesias. Safe injection practices and needle disposal techniques used. Medications properly checked for expiration dates. SDV (single dose vial) medications used.   Description of the Procedure: Protocol guidelines were followed. The patient was placed in position over the fluoroscopy table. The  target area was identified and the area prepped in the usual manner. Skin desensitized using vapocoolant spray. Skin & deeper tissues infiltrated with local anesthetic. Appropriate amount of time allowed to pass for local anesthetics to take effect. The procedure needle was introduced through the skin, ipsilateral to the reported pain, and advanced to the target area. Bone was contacted and the needle walked caudad, until the lamina was cleared. The epidural space was identified using "loss-of-resistance  technique" with 2-3 ml of PF-NaCl (0.9% NSS), in a 5cc LOR glass syringe. Proper needle placement secured. Negative aspiration confirmed. Solution injected in intermittent fashion, asking for systemic symptoms every 0.5cc of injectate. The needles were then removed and the area cleansed, making sure to leave some of the prepping solution back to take advantage of its long term bactericidal properties. EBL: None Materials & Medications Used:  Needle(s) Used: 20g - 10cm, Tuohy-style epidural needle Medication(s): Please see chart orders for medication and dosing details.  Imaging Guidance:  Type of Imaging Technique: Fluoroscopy Guidance (Spinal) Indication(s): Assistance in needle guidance and placement for procedures requiring needle placement in or near specific anatomical locations not easily accessible without such assistance. Exposure Time: Please see nurses notes. Contrast: Before injecting any contrast, we confirmed that the patient did not have an allergy to iodine, shellfish, or radiological contrast. Once satisfactory needle placement was completed at the desired level, radiological contrast was injected. Injection was conducted under continuous fluoroscopic guidance. Injection of contrast accomplished without complications. See chart for type and volume of contrast used. Fluoroscopic Guidance: I was personally present in the fluoroscopy suite, where the patient was placed in position for the  procedure, over the fluoroscopy-compatible table. Fluoroscopy was manipulated, using "Tunnel Vision Technique", to obtain the best possible view of the target area, on the affected side. Parallax error was corrected before commencing the procedure. A "direction-depth-direction" technique was used to introduce the needle under continuous pulsed fluoroscopic guidance. Once the target was reached, antero-posterior, oblique, and lateral fluoroscopic projection views were taken to confirm needle placement in all planes. Permanently recorded images stored by scanning into EMR. Interpretation: Intraoperative imaging interpretation by performing Physician. Adequate needle placement confirmed. Adequate needle placement confirmed in AP, lateral, & Oblique Views. Appropriate spread of contrast to desired area. No evidence of afferent or efferent intravascular uptake. No intrathecal or subarachnoid spread observed. Permanent hardcopy images in multiple planes scanned into the patient's record.  Antibiotic Prophylaxis:  Indication(s): No indications identified. Type:  Antibiotics Given (last 72 hours)    None       Post-operative Assessment:   Complications: No immediate post-treatment complications were observed. Relevant Post-operative Information:  Disposition: Return to clinic for follow-up evaluation. The patient tolerated the entire procedure well. A repeat set of vitals were taken after the procedure and the patient was kept under observation following institutional policy, for this procedure. Post-procedural neurological assessment was performed, showing return to baseline, prior to discharge. The patient was discharged home, once institutional criteria were met. The patient was provided with post-procedure discharge instructions, including a section on how to identify potential problems. Should any problems arise concerning this procedure, the patient was given instructions to immediately contact us, at  any time, without hesitation. In any case, we plan to contact the patient by telephone for a follow-up status report regarding this interventional procedure. Comments:  No additional relevant information.  Medications administered during this visit: We administered sodium chloride, ropivacaine (PF) 2 mg/ml (0.2%), iopamidol, lidocaine (PF), and triamcinolone acetonide.  Prescriptions ordered during this visit: New Prescriptions   No medications on file    Future Appointments Date Time Provider [place]  03/07/2016 1:20 PM Milinda Pointer, MD [facility] None  04/01/2016 1:20 PM Milinda Pointer, MD [facility] None    Primary Care Physician: Pcp Not In System Location: [facility] Note by: Kathlen Brunswick. Dossie Arbour, M.D, DABA, DABAPM, DABPM, DABIPP, FIPP  Disclaimer:  Medicine is not an exact science. The only  guarantee in medicine is that nothing is guaranteed. It is important to note that the decision to proceed with this intervention was based on the information collected from the patient. The Data and conclusions were drawn from the patient's questionnaire, the interview, and the physical examination. Because the information was provided in large part by the patient, it cannot be guaranteed that it has not been purposely or unconsciously manipulated. Every effort has been made to obtain as much relevant data as possible for this evaluation. It is important to note that the conclusions that lead to this procedure are derived in large part from the available data. Always take into account that the treatment will also be dependent on availability of resources and existing treatment guidelines, considered by other Pain Management Practitioners as being common knowledge and practice, at the time of the intervention. For Medico-Legal purposes, it is also important to point out that variation in procedural techniques and pharmacological choices are the acceptable  norm. The indications, contraindications, technique, and results of the above procedure should only be interpreted and judged by a Board-Certified Interventional Pain Specialist with extensive familiarity and expertise in the same exact procedure and technique. Attempts at providing opinions without similar or greater experience and expertise than that of the treating physician will be considered as inappropriate and unethical, and shall result in a formal complaint to the state medical board and applicable specialty societies.

## 2016-02-20 NOTE — Patient Instructions (Addendum)
Pain Management Discharge Instructions  General Discharge Instructions :  If you need to reach your doctor call: Monday-Friday 8:00 am - 4:00 pm at [phone] or toll free [phone].  After clinic hours [phone] to have operator reach doctor.  Bring all of your medication bottles to all your appointments in the pain clinic.  To cancel or reschedule your appointment with Pain Management please remember to call 24 hours in advance to avoid a fee.  Refer to the educational materials which you have been given on: General Risks, I had my Procedure. Discharge Instructions, Post Sedation.  Post Procedure Instructions:  The drugs you were given will stay in your system until tomorrow, so for the next 24 hours you should not drive, make any legal decisions or drink any alcoholic beverages.  You may eat anything you prefer, but it is better to start with liquids then soups and crackers, and gradually work up to solid foods.  Please notify your doctor immediately if you have any unusual bleeding, trouble breathing or pain that is not related to your normal pain.  Depending on the type of procedure that was done, some parts of your body may feel week and/or numb.  This usually clears up by tonight or the next day.  Walk with the use of an assistive device or accompanied by an adult for the 24 hours.  You may use ice on the affected area for the first 24 hours.  Put ice in a Ziploc bag and cover with a towel and place against area 15 minutes on 15 minutes off.  You may switch to heat after 24 hours.GENERAL RISKS AND COMPLICATIONS  What are the risk, side effects and possible complications? Generally speaking, most procedures are safe.  However, with any procedure there are risks, side effects, and the possibility of complications.  The risks and complications are dependent upon the sites that are lesioned, or the type of nerve block to be performed.  The closer the procedure is to the spine,  the more serious the risks are.  Great care is taken when placing the radio frequency needles, block needles or lesioning probes, but sometimes complications can occur. 1. Infection: Any time there is an injection through the skin, there is a risk of infection.  This is why sterile conditions are used for these blocks.  There are four possible types of infection. 1. Localized skin infection. 2. Central Nervous System Infection-This can be in the form of Meningitis, which can be deadly. 3. Epidural Infections-This can be in the form of an epidural abscess, which can cause pressure inside of the spine, causing compression of the spinal cord with subsequent paralysis. This would require an emergency surgery to decompress, and there are no guarantees that the patient would recover from the paralysis. 4. Discitis-This is an infection of the intervertebral discs.  It occurs in about 1% of discography procedures.  It is difficult to treat and it may lead to surgery.        2. Pain: the needles have to go through skin and soft tissues, will cause soreness.       3. Damage to internal structures:  The nerves to be lesioned may be near blood vessels or    other nerves which can be potentially damaged.       4. Bleeding: Bleeding is more common if the patient is taking blood thinners such as  aspirin, Coumadin, Ticiid, Plavix, etc., or if he/she have some genetic predisposition  such as  hemophilia. Bleeding into the spinal canal can cause compression of the spinal  cord with subsequent paralysis.  This would require an emergency surgery to  decompress and there are no guarantees that the patient would recover from the  paralysis.       5. Pneumothorax:  Puncturing of a lung is a possibility, every time a needle is introduced in  the area of the chest or upper back.  Pneumothorax refers to free air around the  collapsed lung(s), inside of the thoracic cavity (chest cavity).  Another two possible  complications  related to a similar event would include: Hemothorax and Chylothorax.   These are variations of the Pneumothorax, where instead of air around the collapsed  lung(s), you may have blood or chyle, respectively.       6. Spinal headaches: They may occur with any procedures in the area of the spine.       7. Persistent CSF (Cerebro-Spinal Fluid) leakage: This is a rare problem, but may occur  with prolonged intrathecal or epidural catheters either due to the formation of a fistulous  track or a dural tear.       8. Nerve damage: By working so close to the spinal cord, there is always a possibility of  nerve damage, which could be as serious as a permanent spinal cord injury with  paralysis.       9. Death:  Although rare, severe deadly allergic reactions known as "Anaphylactic  reaction" can occur to any of the medications used.      10. Worsening of the symptoms:  We can always make thing worse.  What are the chances of something like this happening? Chances of any of this occuring are extremely low.  By statistics, you have more of a chance of getting killed in a motor vehicle accident: while driving to the hospital than any of the above occurring .  Nevertheless, you should be aware that they are possibilities.  In general, it is similar to taking a shower.  Everybody knows that you can slip, hit your head and get killed.  Does that mean that you should not shower again?  Nevertheless always keep in mind that statistics do not mean anything if you happen to be on the wrong side of them.  Even if a procedure has a 1 (one) in a 1,000,000 (million) chance of going wrong, it you happen to be that one..Also, keep in mind that by statistics, you have more of a chance of having something go wrong when taking medications.  Who should not have this procedure? If you are on a blood thinning medication (e.g. Coumadin, Plavix, see list of "Blood Thinners"), or if you have an active infection going on, you should not  have the procedure.  If you are taking any blood thinners, please inform your physician.  How should I prepare for this procedure?  Do not eat or drink anything at least six hours prior to the procedure.  Bring a driver with you .  It cannot be a taxi.  Come accompanied by an adult that can drive you back, and that is strong enough to help you if your legs get weak or numb from the local anesthetic.  Take all of your medicines the morning of the procedure with just enough water to swallow them.  If you have diabetes, make sure that you are scheduled to have your procedure done first thing in the morning, whenever possible.  If you have diabetes,  take only half of your insulin dose and notify our nurse that you have done so as soon as you arrive at the clinic.  If you are diabetic, but only take blood sugar pills (oral hypoglycemic), then do not take them on the morning of your procedure.  You may take them after you have had the procedure.  Do not take aspirin or any aspirin-containing medications, at least eleven (11) days prior to the procedure.  They may prolong bleeding.  Wear loose fitting clothing that may be easy to take off and that you would not mind if it got stained with Betadine or blood.  Do not wear any jewelry or perfume  Remove any nail coloring.  It will interfere with some of our monitoring equipment.  NOTE: Remember that this is not meant to be interpreted as a complete list of all possible complications.  Unforeseen problems may occur.  BLOOD THINNERS The following drugs contain aspirin or other products, which can cause increased bleeding during surgery and should not be taken for 2 weeks prior to and 1 week after surgery.  If you should need take something for relief of minor pain, you may take acetaminophen which is found in Tylenol,m Datril, Anacin-3 and Panadol. It is not blood thinner. The products listed below are.  Do not take any of the products listed below  in addition to any listed on your instruction sheet.  A.P.C or A.P.C with Codeine Codeine Phosphate Capsules #3 Ibuprofen Ridaura  ABC compound Congesprin Imuran rimadil  Advil Cope Indocin Robaxisal  Alka-Seltzer Effervescent Pain Reliever and Antacid Coricidin or Coricidin-D  Indomethacin Rufen  Alka-Seltzer plus Cold Medicine Cosprin Ketoprofen S-A-C Tablets  Anacin Analgesic Tablets or Capsules Coumadin Korlgesic Salflex  Anacin Extra Strength Analgesic tablets or capsules CP-2 Tablets Lanoril Salicylate  Anaprox Cuprimine Capsules Levenox Salocol  Anexsia-D Dalteparin Magan Salsalate  Anodynos Darvon compound Magnesium Salicylate Sine-off  Ansaid Dasin Capsules Magsal Sodium Salicylate  Anturane Depen Capsules Marnal Soma  APF Arthritis pain formula Dewitt's Pills Measurin Stanback  Argesic Dia-Gesic Meclofenamic Sulfinpyrazone  Arthritis Bayer Timed Release Aspirin Diclofenac Meclomen Sulindac  Arthritis pain formula Anacin Dicumarol Medipren Supac  Analgesic (Safety coated) Arthralgen Diffunasal Mefanamic Suprofen  Arthritis Strength Bufferin Dihydrocodeine Mepro Compound Suprol  Arthropan liquid Dopirydamole Methcarbomol with Aspirin Synalgos  ASA tablets/Enseals Disalcid Micrainin Tagament  Ascriptin Doan's Midol Talwin  Ascriptin A/D Dolene Mobidin Tanderil  Ascriptin Extra Strength Dolobid Moblgesic Ticlid  Ascriptin with Codeine Doloprin or Doloprin with Codeine Momentum Tolectin  Asperbuf Duoprin Mono-gesic Trendar  Aspergum Duradyne Motrin or Motrin IB Triminicin  Aspirin plain, buffered or enteric coated Durasal Myochrisine Trigesic  Aspirin Suppositories Easprin Nalfon Trillsate  Aspirin with Codeine Ecotrin Regular or Extra Strength Naprosyn Uracel  Atromid-S Efficin Naproxen Ursinus  Auranofin Capsules Elmiron Neocylate Vanquish  Axotal Emagrin Norgesic Verin  Azathioprine Empirin or Empirin with Codeine Normiflo Vitamin E  Azolid Emprazil Nuprin Voltaren  Bayer  Aspirin plain, buffered or children's or timed BC Tablets or powders Encaprin Orgaran Warfarin Sodium  Buff-a-Comp Enoxaparin Orudis Zorpin  Buff-a-Comp with Codeine Equegesic Os-Cal-Gesic   Buffaprin Excedrin plain, buffered or Extra Strength Oxalid   Bufferin Arthritis Strength Feldene Oxphenbutazone   Bufferin plain or Extra Strength Feldene Capsules Oxycodone with Aspirin   Bufferin with Codeine Fenoprofen Fenoprofen Pabalate or Pabalate-SF   Buffets II Flogesic Panagesic   Buffinol plain or Extra Strength Florinal or Florinal with Codeine Panwarfarin   Buf-Tabs Flurbiprofen Penicillamine   Butalbital Compound Four-way cold tablets  Penicillin   Butazolidin Fragmin Pepto-Bismol   Carbenicillin Geminisyn Percodan   Carna Arthritis Reliever Geopen Persantine   Carprofen Gold's salt Persistin   Chloramphenicol Goody's Phenylbutazone   Chloromycetin Haltrain Piroxlcam   Clmetidine heparin Plaquenil   Cllnoril Hyco-pap Ponstel   Clofibrate Hydroxy chloroquine Propoxyphen         Before stopping any of these medications, be sure to consult the physician who ordered them.  Some, such as Coumadin (Warfarin) are ordered to prevent or treat serious conditions such as "deep thrombosis", "pumonary embolisms", and other heart problems.  The amount of time that you may need off of the medication may also vary with the medication and the reason for which you were taking it.  If you are taking any of these medications, please make sure you notify your pain physician before you undergo any procedures.         Epidural Steroid Injection An epidural steroid injection is given to relieve pain in your neck, back, or legs that is caused by the irritation or swelling of a nerve root. This procedure involves injecting a steroid and numbing medicine (anesthetic) into the epidural space. The epidural space is the space between the outer covering of your spinal cord and the bones that form your backbone  (vertebra).  LET YOUR HEALTH CARE PROVIDER KNOW ABOUT:  2. Any allergies you have. 3. All medicines you are taking, including vitamins, herbs, eye drops, creams, and over-the-counter medicines such as aspirin. 4. Previous problems you or members of your family have had with the use of anesthetics. 5. Any blood disorders or blood clotting disorders you have. 6. Previous surgeries you have had. 7. Medical conditions you have. RISKS AND COMPLICATIONS Generally, this is a safe procedure. However, as with any procedure, complications can occur. Possible complications of epidural steroid injection include:  Headache.  Bleeding.  Infection.  Allergic reaction to the medicines.  Damage to your nerves. The response to this procedure depends on the underlying cause of the pain and its duration. People who have long-term (chronic) pain are less likely to benefit from epidural steroids than are those people whose pain comes on strong and suddenly. BEFORE THE PROCEDURE   Ask your health care provider about changing or stopping your regular medicines. You may be advised to stop taking blood-thinning medicines a few days before the procedure.  You may be given medicines to reduce anxiety.  Arrange for someone to take you home after the procedure. PROCEDURE   You will remain awake during the procedure. You may receive medicine to make you relaxed.  You will be asked to lie on your stomach.  The injection site will be cleaned.  The injection site will be numbed with a medicine (local anesthetic).  A needle will be injected through your skin into the epidural space.  Your health care provider will use an X-ray machine to ensure that the steroid is delivered closest to the affected nerve. You may have minimal discomfort at this time.  Once the needle is in the right position, the local anesthetic and the steroid will be injected into the epidural space.  The needle will then be removed and a  bandage will be applied to the injection site. AFTER THE PROCEDURE  12. You may be monitored for a short time before you go home. 13. You may feel weakness or numbness in your arm or leg, which disappears within hours. 14. You may be allowed to eat, drink, and take your regular   medicine. 15. You may have soreness at the site of the injection.   This information is not intended to replace advice given to you by your health care provider. Make sure you discuss any questions you have with your health care provider.   Document Released: 01/07/2008 Document Revised: 06/02/2013 Document Reviewed: 03/19/2013 Elsevier Interactive Patient Education [employer].  Please complete your post procedure sheet.

## 2016-02-20 NOTE — Progress Notes (Signed)
Safety precautions to be maintained throughout the outpatient stay will include: orient to surroundings, keep bed in low position, maintain call bell within reach at all times, provide assistance with transfer out of bed and ambulation.  

## 2016-02-21 ENCOUNTER — Telehealth: Payer: Self-pay | Admitting: *Deleted

## 2016-02-21 NOTE — Telephone Encounter (Signed)
Denies complications post procedure. 

## 2016-03-02 ENCOUNTER — Other Ambulatory Visit: Payer: Self-pay | Admitting: Pain Medicine

## 2016-03-06 ENCOUNTER — Telehealth: Payer: Self-pay

## 2016-03-07 ENCOUNTER — Encounter: Payer: Self-pay | Admitting: Pain Medicine

## 2016-03-07 ENCOUNTER — Ambulatory Visit: Payer: Medicare Other | Attending: Pain Medicine | Admitting: Pain Medicine

## 2016-03-07 VITALS — BP 167/71 | HR 57 | Temp 98.1°F | Resp 16 | Ht 69.0 in | Wt 160.0 lb

## 2016-03-07 DIAGNOSIS — M542 Cervicalgia: Secondary | ICD-10-CM | POA: Diagnosis present

## 2016-03-07 DIAGNOSIS — E785 Hyperlipidemia, unspecified: Secondary | ICD-10-CM | POA: Diagnosis not present

## 2016-03-07 DIAGNOSIS — M5410 Radiculopathy, site unspecified: Secondary | ICD-10-CM

## 2016-03-07 DIAGNOSIS — G8929 Other chronic pain: Secondary | ICD-10-CM | POA: Diagnosis not present

## 2016-03-07 DIAGNOSIS — M545 Low back pain: Secondary | ICD-10-CM | POA: Insufficient documentation

## 2016-03-07 DIAGNOSIS — M65331 Trigger finger, right middle finger: Secondary | ICD-10-CM | POA: Insufficient documentation

## 2016-03-07 DIAGNOSIS — F319 Bipolar disorder, unspecified: Secondary | ICD-10-CM | POA: Diagnosis not present

## 2016-03-07 DIAGNOSIS — M47892 Other spondylosis, cervical region: Secondary | ICD-10-CM | POA: Diagnosis not present

## 2016-03-07 DIAGNOSIS — M5412 Radiculopathy, cervical region: Secondary | ICD-10-CM | POA: Diagnosis not present

## 2016-03-07 DIAGNOSIS — M5414 Radiculopathy, thoracic region: Secondary | ICD-10-CM

## 2016-03-07 DIAGNOSIS — M47812 Spondylosis without myelopathy or radiculopathy, cervical region: Secondary | ICD-10-CM | POA: Diagnosis not present

## 2016-03-07 DIAGNOSIS — M5126 Other intervertebral disc displacement, lumbar region: Secondary | ICD-10-CM | POA: Insufficient documentation

## 2016-03-07 DIAGNOSIS — M5416 Radiculopathy, lumbar region: Secondary | ICD-10-CM | POA: Diagnosis not present

## 2016-03-07 DIAGNOSIS — I70209 Unspecified atherosclerosis of native arteries of extremities, unspecified extremity: Secondary | ICD-10-CM | POA: Insufficient documentation

## 2016-03-07 DIAGNOSIS — Z7984 Long term (current) use of oral hypoglycemic drugs: Secondary | ICD-10-CM | POA: Diagnosis not present

## 2016-03-07 DIAGNOSIS — M1990 Unspecified osteoarthritis, unspecified site: Secondary | ICD-10-CM | POA: Diagnosis not present

## 2016-03-07 DIAGNOSIS — M5124 Other intervertebral disc displacement, thoracic region: Secondary | ICD-10-CM | POA: Diagnosis not present

## 2016-03-07 DIAGNOSIS — M5030 Other cervical disc degeneration, unspecified cervical region: Secondary | ICD-10-CM | POA: Diagnosis not present

## 2016-03-07 DIAGNOSIS — M47896 Other spondylosis, lumbar region: Secondary | ICD-10-CM | POA: Diagnosis not present

## 2016-03-07 DIAGNOSIS — F17210 Nicotine dependence, cigarettes, uncomplicated: Secondary | ICD-10-CM | POA: Diagnosis not present

## 2016-03-07 DIAGNOSIS — K5730 Diverticulosis of large intestine without perforation or abscess without bleeding: Secondary | ICD-10-CM | POA: Diagnosis not present

## 2016-03-07 DIAGNOSIS — K219 Gastro-esophageal reflux disease without esophagitis: Secondary | ICD-10-CM | POA: Insufficient documentation

## 2016-03-07 DIAGNOSIS — M47816 Spondylosis without myelopathy or radiculopathy, lumbar region: Secondary | ICD-10-CM

## 2016-03-07 DIAGNOSIS — Z7982 Long term (current) use of aspirin: Secondary | ICD-10-CM | POA: Insufficient documentation

## 2016-03-07 DIAGNOSIS — R0602 Shortness of breath: Secondary | ICD-10-CM | POA: Insufficient documentation

## 2016-03-07 DIAGNOSIS — M961 Postlaminectomy syndrome, not elsewhere classified: Secondary | ICD-10-CM

## 2016-03-07 DIAGNOSIS — J449 Chronic obstructive pulmonary disease, unspecified: Secondary | ICD-10-CM | POA: Diagnosis not present

## 2016-03-07 DIAGNOSIS — M797 Fibromyalgia: Secondary | ICD-10-CM | POA: Insufficient documentation

## 2016-03-07 DIAGNOSIS — M4806 Spinal stenosis, lumbar region: Secondary | ICD-10-CM | POA: Insufficient documentation

## 2016-03-07 DIAGNOSIS — M549 Dorsalgia, unspecified: Secondary | ICD-10-CM | POA: Diagnosis present

## 2016-03-07 DIAGNOSIS — Z79891 Long term (current) use of opiate analgesic: Secondary | ICD-10-CM | POA: Diagnosis not present

## 2016-03-07 DIAGNOSIS — I10 Essential (primary) hypertension: Secondary | ICD-10-CM | POA: Insufficient documentation

## 2016-03-07 DIAGNOSIS — I4891 Unspecified atrial fibrillation: Secondary | ICD-10-CM | POA: Diagnosis not present

## 2016-03-07 NOTE — Progress Notes (Signed)
Safety precautions to be maintained throughout the outpatient stay will include: orient to surroundings, keep bed in low position, maintain call bell within reach at all times, provide assistance with transfer out of bed and ambulation.

## 2016-03-07 NOTE — Patient Instructions (Signed)
Smoking Cessation, Tips for Success If you are ready to quit smoking, congratulations! You have chosen to help yourself be healthier. Cigarettes bring nicotine, tar, carbon monoxide, and other irritants into your body. Your lungs, heart, and blood vessels will be able to work better without these poisons. There are many different ways to quit smoking. Nicotine gum, nicotine patches, a nicotine inhaler, or nicotine nasal spray can help with physical craving. Hypnosis, support groups, and medicines help break the habit of smoking. WHAT THINGS CAN I DO TO MAKE QUITTING EASIER?  Here are some tips to help you quit for good:  Pick a date when you will quit smoking completely. Tell all of your friends and family about your plan to quit on that date.  Do not try to slowly cut down on the number of cigarettes you are smoking. Pick a quit date and quit smoking completely starting on that day.  Throw away all cigarettes.   Clean and remove all ashtrays from your home, work, and car.  On a card, write down your reasons for quitting. Carry the card with you and read it when you get the urge to smoke.  Cleanse your body of nicotine. Drink enough water and fluids to keep your urine clear or pale yellow. Do this after quitting to flush the nicotine from your body.  Learn to predict your moods. Do not let a bad situation be your excuse to have a cigarette. Some situations in your life might tempt you into wanting a cigarette.  Never have "just one" cigarette. It leads to wanting another and another. Remind yourself of your decision to quit.  Change habits associated with smoking. If you smoked while driving or when feeling stressed, try other activities to replace smoking. Stand up when drinking your coffee. Brush your teeth after eating. Sit in a different chair when you read the paper. Avoid alcohol while trying to quit, and try to drink fewer caffeinated beverages. Alcohol and caffeine may urge you to  smoke.  Avoid foods and drinks that can trigger a desire to smoke, such as sugary or spicy foods and alcohol.  Ask people who smoke not to smoke around you.  Have something planned to do right after eating or having a cup of coffee. For example, plan to take a walk or exercise.  Try a relaxation exercise to calm you down and decrease your stress. Remember, you may be tense and nervous for the first 2 weeks after you quit, but this will pass.  Find new activities to keep your hands busy. Play with a pen, coin, or rubber band. Doodle or draw things on paper.  Brush your teeth right after eating. This will help cut down on the craving for the taste of tobacco after meals. You can also try mouthwash.   Use oral substitutes in place of cigarettes. Try using lemon drops, carrots, cinnamon sticks, or chewing gum. Keep them handy so they are available when you have the urge to smoke.  When you have the urge to smoke, try deep breathing.  Designate your home as a nonsmoking area.  If you are a heavy smoker, ask your health care provider about a prescription for nicotine chewing gum. It can ease your withdrawal from nicotine.  Reward yourself. Set aside the cigarette money you save and buy yourself something nice.  Look for support from others. Join a support group or smoking cessation program. Ask someone at home or at work to help you with your plan   to quit smoking.  Always ask yourself, "Do I need this cigarette or is this just a reflex?" Tell yourself, "Today, I choose not to smoke," or "I do not want to smoke." You are reminding yourself of your decision to quit.  Do not replace cigarette smoking with electronic cigarettes (commonly called e-cigarettes). The safety of e-cigarettes is unknown, and some may contain harmful chemicals.  If you relapse, do not give up! Plan ahead and think about what you will do the next time you get the urge to smoke. HOW WILL I FEEL WHEN I QUIT SMOKING? You  may have symptoms of withdrawal because your body is used to nicotine (the addictive substance in cigarettes). You may crave cigarettes, be irritable, feel very hungry, cough often, get headaches, or have difficulty concentrating. The withdrawal symptoms are only temporary. They are strongest when you first quit but will go away within 10-14 days. When withdrawal symptoms occur, stay in control. Think about your reasons for quitting. Remind yourself that these are signs that your body is healing and getting used to being without cigarettes. Remember that withdrawal symptoms are easier to treat than the major diseases that smoking can cause.  Even after the withdrawal is over, expect periodic urges to smoke. However, these cravings are generally short lived and will go away whether you smoke or not. Do not smoke! WHAT RESOURCES ARE AVAILABLE TO HELP ME QUIT SMOKING? Your health care provider can direct you to community resources or hospitals for support, which may include:  Group support.  Education.  Hypnosis.  Therapy.   This information is not intended to replace advice given to you by your health care provider. Make sure you discuss any questions you have with your health care provider.   Document Released: 06/28/2004 Document Revised: 10/21/2014 Document Reviewed: 03/18/2013 Elsevier Interactive Patient Education [employer].

## 2016-03-07 NOTE — Progress Notes (Signed)
Patient's Name: Rick RAWL Sr.  Patient type: Established  MRN: [id number]  Service setting: Ambulatory outpatient  DOB: 07/17/1951  Location: [facility]  DOS: 03/07/2016  Primary Care Physician: Pcp Not In System  Note by: Dimitri Shakespeare A. Dossie Arbour, M.D, DABA, DABAPM, DABPM, DABIPP, FIPP  Referring Physician: No ref. provider found  Specialty: Board-Certified Interventional Pain Management  Last Visit to Pain Management: 03/06/2016   Primary Reason(s) for Visit: Encounter for post-procedure evaluation of chronic illness with mild to moderate exacerbation CC: Back Pain and Neck Pain   HPI  Rick Carter is a 65 y.o. year old, male patient, who returns today as an established patient. 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[place]); Atrial fibrillation ([place]); Bipolar affective disorder ([place]); Chronic obstructive pulmonary disease ([place]); Degeneration of intervertebral disc of cervical region; Colon, diverticulosis; Acid reflux; Essential (primary) hypertension; HLD (hyperlipidemia); Breath shortness; Compulsive tobacco user syndrome; Diverticular disease of large intestine; Post-thoracotomy pain syndrome; Encounter for therapeutic drug level monitoring; Long term current use of opiate analgesic; Long term prescription opiate use; Uncomplicated opioid dependence ([place]); Opiate use; Fibromyalgia; Chronic pain syndrome; Chronic pain; Lumbar facet syndrome (Location of Primary Source of Pain) (Bilateral) (R>L); History of alcoholism ([place]); Chronic low back pain (Location of Primary Source of Pain) (midline); Lumbar spondylosis; Cervical spondylosis; Failed cervical surgery syndrome; Nicotine dependence; Tobacco abuse; Substance Use Disorder Risk: High; History of right-sided partial pneumonectomy; Atherosclerotic peripheral vascular disease ([place]); Musculoskeletal pain; Myofascial pain; Neurogenic pain; Neuropathic pain; Opioid-induced constipation ([place]); Osteoarthritis; NSAID  induced gastritis; Trigger finger of right hand (middle finger); Thoracic spine pain; Thoracic radicular pain (Right); Chronic lumbar radicular pain (L5/S1 dermatome) (Location of Secondary source of pain) (Right); Lumbar foraminal stenosis (L5-S1) (Right); Lumbar facet arthropathy (Bilateral) (R>L); Lumbar disc protrusion (L3-4) (Right); and Chronic cervical radicular pain (Left) on his problem list.. His primarily concern today is the Back Pain and Neck Pain   Pain Assessment: Self-Reported Pain Score: 3  Reported level is compatible with observation Pain Type: Chronic pain Pain Location: Neck Pain Orientation: Lower Pain Descriptors / Indicators: Aching, Constant, Radiating Pain Frequency: Constant  The patient comes into the clinics today for post-procedure evaluation on the interventional treatment done on 03/02/2016.  Date of Last Visit: 02/20/16 Service Provided on Last Visit: Procedure (LESI)  Post-Procedure Assessment  Procedure done on last visit: Palliative right-sided L5-S1 lumbar epidural steroid injection under fluoroscopic guidance without  IV sedation. Side-effects or Adverse reactions: None reported Sedation: No sedation used  Results: Ultra-Short Term Relief (First 1 hour after procedure): 50 %  Analgesia during this period is likely to be Local Anesthetic and/or IV Sedative (Analgesic/Anxiolitic) related Short Term Relief (Initial 4-6 hrs after procedure): 50 % Complete relief confirms area to be the source of pain Long Term Relief : 50 % Long-term benefit would suggest an inflammatory etiology to the pain   Current Relief (Now): 50%  Persistent relief would suggest effective anti-inflammatory effects from steroids Interpretation of Results: Based on the results of this still proves to be a good palliative options to treat this pain.  Laboratory Chemistry  Inflammation Markers Lab Results  Component Value Date   ESRSEDRATE 1 10/10/2015   CRP 0.8 10/10/2015     Renal Function Lab Results  Component Value Date   BUN 13 10/10/2015   CREATININE 0.82 10/10/2015   GFRAA >60 10/10/2015   GFRNONAA >60 10/10/2015    Hepatic Function Lab Results  Component Value Date   AST 21 10/10/2015  ALT 16* 10/10/2015   ALBUMIN 4.5 10/10/2015    Electrolytes Lab Results  Component Value Date   NA 136 10/10/2015   K 3.5 10/10/2015   CL 98* 10/10/2015   CALCIUM 9.8 10/10/2015   MG 2.3 10/10/2015    Pain Modulating Vitamins No results found for: [place], VD125OH2TOT, [id number], QT6226JF3, VITAMINB12  Coagulation Parameters No results found for: INR, LABPROT  Note: I personally reviewed the above data. Results made available to patient.  Recent Diagnostic Imaging  Rick Carter  01/10/2016  CLINICAL DATA:  Upper back pain and bilateral arm pain for 8-9 months EXAM: MRI THORACIC SPINE WITHOUT Carter TECHNIQUE: Multiplanar, multisequence Rick imaging of the thoracic spine was performed. No intravenous Carter was administered. COMPARISON:  None. FINDINGS: The vertebral body heights are maintained. The alignment is anatomic. There is no acute fracture or static listhesis. The marrow signal is normal. The thoracic spinal cord is normal in size and signal. Mild degenerative disc disease at C6-7. T1-T2: No significant disc protrusion or central canal stenosis. Mild right foraminal stenosis. No left foraminal stenosis. T2-T3: No disc protrusion, foraminal stenosis or central canal stenosis. T3-T4: No disc protrusion, foraminal stenosis or central canal stenosis. T4-T5: No disc protrusion, foraminal stenosis or central canal stenosis. T5-T6: No disc protrusion, foraminal stenosis or central canal stenosis. T6-T7: No disc protrusion, foraminal stenosis or central canal stenosis. T7-T8: No disc protrusion, foraminal stenosis or central canal stenosis. T8-T9: Small central disc protrusion. No foraminal or central canal stenosis. T9-T10: No disc  protrusion, foraminal stenosis or central canal stenosis. T10-T11: No disc protrusion, foraminal stenosis or central canal stenosis. T11-T12: No disc protrusion, foraminal stenosis or central canal stenosis. IMPRESSION: 1. No acute osseous injury of the thoracic spine. 2. Small central disc protrusion at T8-9. Electronically Signed   By: Kathreen Devoid   On: 01/10/2016 10:34    Meds  The patient has a current medication list which includes the following prescription(s): advair diskus, albuterol sulfate, aspirin ec, calcium carb-cholecalciferol, cholecalciferol, cilostazol, cyclobenzaprine, diphenhydramine, docusate sodium, fluticasone-salmeterol, hydrochlorothiazide, hydrochlorothiazide, hydroxyzine, ibuprofen, loratadine, magnesium oxide, magnesium oxide, metformin, methocarbamol, multiple vitamins-minerals, oxycodone-acetaminophen, oxycodone-acetaminophen, oxycodone-acetaminophen, pantoprazole, pregabalin, trazodone, tudorza pressair, ventolin hfa, aclidinium bromide, and oxycodone-acetaminophen, and the following Facility-Administered Medications: triamcinolone acetonide.  Current Outpatient Prescriptions on File Prior to Visit  Medication Sig  . Albuterol Sulfate (VENTOLIN HFA IN) Inhale 1 puff into the lungs.  Marland Kitchen aspirin EC 81 MG tablet Take 81 mg by mouth daily.  . Calcium Carb-Cholecalciferol (SM CALCIUM/VITAMIN D) 600-800 MG-UNIT TABS   . cholecalciferol (VITAMIN D) 1000 UNITS tablet Take 2,000 Units by mouth daily.  . cilostazol (PLETAL) 100 MG tablet Take 100 mg by mouth.  . cyclobenzaprine (FLEXERIL) 10 MG tablet Take 10 mg by mouth daily.   . diphenhydrAMINE (BENADRYL) 25 MG tablet Take 25 mg by mouth at bedtime as needed for allergies.  Marland Kitchen docusate sodium (COLACE) 100 MG capsule Take 1 capsule (100 mg total) by mouth daily.  . Fluticasone-Salmeterol (ADVAIR) 500-50 MCG/DOSE AEPB Inhale 1 puff into the lungs 2 (two) times daily.  . hydrochlorothiazide (HYDRODIURIL) 25 MG tablet Take 12.5 mg  by mouth.  . hydrOXYzine (ATARAX/VISTARIL) 25 MG tablet Take 25 mg by mouth as needed.  Marland Kitchen ibuprofen (ADVIL,MOTRIN) 800 MG tablet Take 1 tablet (800 mg total) by mouth every 8 (eight) hours as needed for moderate pain.  Marland Kitchen loratadine (CLARITIN) 10 MG tablet Take 10 mg by mouth.  . magnesium oxide (MAG-OX) 400 MG tablet Take 1  tablet (400 mg total) by mouth daily.  . metFORMIN (GLUCOPHAGE) 500 MG tablet Take 500 mg by mouth 2 (two) times daily with a meal.  . methocarbamol (ROBAXIN) 750 MG tablet Take 1 tablet (750 mg total) by mouth every 8 (eight) hours as needed for muscle spasms.  . Multiple Vitamins-Minerals (PRESERVISION AREDS 2 PO) Take 1 tablet by mouth 2 (two) times daily.  Marland Kitchen oxyCODONE-acetaminophen (PERCOCET/ROXICET) 5-325 MG tablet Take 1 tablet by mouth every 6 (six) hours as needed for moderate pain or severe pain.  Marland Kitchen oxyCODONE-acetaminophen (PERCOCET/ROXICET) 5-325 MG tablet Take 1 tablet by mouth every 6 (six) hours as needed for moderate pain or severe pain.  Marland Kitchen oxyCODONE-acetaminophen (PERCOCET/ROXICET) 5-325 MG tablet Take 1 tablet by mouth every 6 (six) hours as needed for moderate pain or severe pain.  . pantoprazole (PROTONIX) 40 MG tablet Take 40 mg by mouth.  . pregabalin (LYRICA) 150 MG capsule Take 1 capsule (150 mg total) by mouth 2 (two) times daily.  . traZODone (DESYREL) 50 MG tablet Take 50 mg by mouth at bedtime. Reported on 02/20/2016  . Aclidinium Bromide (TUDORZA PRESSAIR IN) Inhale 40 mcg into the lungs 2 (two) times daily.  Marland Kitchen oxyCODONE-acetaminophen (PERCOCET) 5-325 MG tablet Take by mouth. Reported on 03/07/2016   Current Facility-Administered Medications on File Prior to Visit  Medication  . triamcinolone acetonide (KENALOG-40) injection 40 mg    ROS  Constitutional: Denies any fever or chills Gastrointestinal: No reported hemesis, hematochezia, vomiting, or acute GI distress Musculoskeletal: Denies any acute onset joint swelling, redness, loss of ROM, or  weakness Neurological: No reported episodes of acute onset apraxia, aphasia, dysarthria, agnosia, amnesia, paralysis, loss of coordination, or loss of consciousness  Allergies  Rick. Deans is allergic to varenicline.  [place]  Medical:  Rick. Sedore  has a past medical history of Depression; Anxiety; COPD (chronic obstructive pulmonary disease) ([place]); Hypertension; History of alcoholism ([place]) (08/08/2015); and History of pneumonectomy (08/08/2015). Family: family history includes Alcohol abuse in his father; Kidney disease in his mother. Surgical:  has past surgical history that includes Tonsillectomy; Lung removal, partial; and Neck surgery. Tobacco:  reports that he has been smoking Cigarettes.  He has a 51 pack-year smoking history. He does not have any smokeless tobacco history on file. Alcohol:  reports that he does not drink alcohol. Drug:  has no drug history on file.  Constitutional Exam  Vitals: Blood pressure 167/71, pulse 57, temperature 98.1 F (36.7 C), resp. rate 16, height _0  (1.753 m), weight 160 lb (72.576 kg), SpO2 97 %. General appearance: Well nourished, well developed, and well hydrated. In no acute distress Calculated BMI/Body habitus: Body mass index is 23.62 kg/(m^2). (18.5-24.9 kg/m2) Ideal body weight Psych/Mental status: Alert and oriented x 3 (person, place, & time) Eyes: PERLA Respiratory: No evidence of acute respiratory distress  Cervical Spine Exam  Inspection: No masses, redness, or swelling Alignment: Symmetrical ROM: Functional: ROM is within functional limits [facility]) Stability: No instability detected Muscle strength & Tone: Functionally intact Sensory: Unimpaired Palpation: No complaints of tenderness  Upper Extremity (UE) Exam    Side: Right upper extremity  Side: Left upper extremity  Inspection: No masses, redness, swelling, or asymmetry  Inspection: No masses, redness, swelling, or asymmetry  ROM:  ROM:  Functional: ROM is within functional  limits [facility])  Functional: ROM is within functional limits [facility])  Muscle strength & Tone: Functionally intact  Muscle strength & Tone: Functionally intact  Sensory: Unimpaired  Sensory: Unimpaired  Palpation: Non-contributory  Palpation: Non-contributory   Thoracic Spine Exam  Inspection: No masses, redness, or swelling Alignment: Symmetrical ROM: Functional: ROM is within functional limits [facility]) Stability: No instability detected Sensory: Unimpaired Muscle strength & Tone: Functionally intact Palpation: No complaints of tenderness  Lumbar Spine Exam  Inspection: No masses, redness, or swelling Alignment: Symmetrical ROM: Functional: ROM is within functional limits [facility]) Stability: No instability detected Muscle strength & Tone: Functionally intact Sensory: Unimpaired Palpation: No complaints of tenderness Provocative Tests: Lumbar Hyperextension and rotation test: deferred Patrick's Maneuver: deferred  Gait & Posture Assessment  Ambulation: Unassisted Gait: Unaffected Posture: WNL  Lower Extremity Exam    Side: Right lower extremity  Side: Left lower extremity  Inspection: No masses, redness, swelling, or asymmetry ROM:  Inspection: No masses, redness, swelling, or asymmetry ROM:  Functional: ROM is within functional limits [facility])  Functional: ROM is within functional limits [facility])  Muscle strength & Tone: Functionally intact  Muscle strength & Tone: Functionally intact  Sensory: Unimpaired  Sensory: Unimpaired  Palpation: Non-contributory  Palpation: Non-contributory   Assessment & Plan  Primary Diagnosis & Pertinent Problem List: The primary encounter diagnosis was Chronic cervical radicular pain (Left). Diagnoses of Cervical spondylosis, Failed cervical surgery syndrome, Chronic lumbar radicular pain (L5/S1 dermatome) (Location of Secondary source of pain) (Right), Chronic low back pain (Location of Primary Source of Pain) (midline), Lumbar facet syndrome (Location of  Primary Source of Pain) (Bilateral) (R>L), and Thoracic radicular pain (Right) were also pertinent to this visit.  Visit Diagnosis: 1. Chronic cervical radicular pain (Left)   2. Cervical spondylosis   3. Failed cervical surgery syndrome   4. Chronic lumbar radicular pain (L5/S1 dermatome) (Location of Secondary source of pain) (Right)   5. Chronic low back pain (Location of Primary Source of Pain) (midline)   6. Lumbar facet syndrome (Location of Primary Source of Pain) (Bilateral) (R>L)   7. Thoracic radicular pain (Right)     Problem-specific Plan(s): No problem-specific assessment & plan notes found for this encounter.   Plan of Care   Problem List Items Addressed This Visit      High   Cervical spondylosis (Chronic)   Chronic cervical radicular pain (Left) - Primary (Chronic)   Relevant Orders   Rick Cervical Spine Wo Carter   CERVICAL EPIDURAL STEROID INJECTION   Chronic low back pain (Location of Primary Source of Pain) (midline) (Chronic)   Chronic lumbar radicular pain (L5/S1 dermatome) (Location of Secondary source of pain) (Right) (Chronic)   Relevant Orders   LUMBAR EPIDURAL STEROID INJECTION   Failed cervical surgery syndrome (Chronic)   Lumbar facet syndrome (Location of Primary Source of Pain) (Bilateral) (R>L) (Chronic)   Relevant Orders   LUMBAR FACET(MEDIAL BRANCH NERVE BLOCK) MBNB   Thoracic radicular pain (Right) (Chronic)   Relevant Orders   THORACIC EPIDURAL STEROID INJECTION       Pharmacotherapy (Medications Ordered): No orders of the defined types were placed in this encounter.    Lab-work & Procedure Ordered: Orders Placed This Encounter  Procedures  . CERVICAL EPIDURAL STEROID INJECTION    Standing Status: Standing     Number of Occurrences: 1     Standing Expiration Date: 03/07/2017    Scheduling Instructions:     Side: Left-sided     Level: C7-T1     Sedation: No Sedation.     Scheduling Timeframe: PRN Procedure. Patient will call to  schedule.     Indication: Neck pain with or without upper extremity pain, numbness, or weakness. Cervical  Spondylosis with or without cervical radicular pain.    Order Specific Question:  Where will this procedure be performed?    Answer:  [facility]     Comments:  by Dr. Dossie Arbour  . LUMBAR FACET(MEDIAL BRANCH NERVE BLOCK) MBNB    For the low back pain    Standing Status: Standing     Number of Occurrences: 1     Standing Expiration Date: 03/07/2017    Scheduling Instructions:     Side: Bilateral     Level: L2, L3, L4, L5, & S1 Medial Branch Nerve     Sedation: With Sedation.     Timeframe: PRN Procedure. Patient will call to schedule.    Order Specific Question:  Where will this procedure be performed?    Answer:  [facility]  . LUMBAR EPIDURAL STEROID INJECTION    For the right leg pain.    Standing Status: Standing     Number of Occurrences: 1     Standing Expiration Date: 03/07/2017    Scheduling Instructions:     Side: Right-sided (L5-S1)     Sedation: No Sedation.     Timeframe: PRN Procedure. Patient will call to schedule.    Order Specific Question:  Where will this procedure be performed?    Answer:  [facility]  . THORACIC EPIDURAL STEROID INJECTION    For the upper back, flank, chest, and abdominal pain.    Standing Status: Standing     Number of Occurrences: 1     Standing Expiration Date: 03/07/2017    Scheduling Instructions:     Side: Right-sided (T6-7)     Sedation: No Sedation.     Timeframe: PRN Procedure. Patient will call to schedule.    Order Specific Question:  Where will this procedure be performed?    Answer:  [facility]  . Rick Cervical Spine Wo Carter    Standing Status: Future     Number of Occurrences:      Standing Expiration Date: 03/07/2017    Scheduling Instructions:     Please provide canal diameter in millimeters when describing any spinal stenosis.    Order Specific Question:  Reason for Exam (SYMPTOM  OR  DIAGNOSIS REQUIRED)    Answer:  Cervical radiculitis/radiculopathy.    Order Specific Question:  Preferred imaging location?    Answer:  [facility]    Order Specific Question:  Does the patient have a pacemaker or implanted devices?    Answer:  No    Order Specific Question:  What is the patient's sedation requirement?    Answer:  No Sedation    Order Specific Question:  Call Results- Best Contact Number?    Answer:  [phone] (Pain Clinic facility) (Dr. Dossie Arbour)    Imaging Ordered: Rick CERVICAL SPINE WO Carter  Interventional Therapies: Scheduled:  None at this time.    Considering:  Implant therapy. Possible lumbar facet radiofrequency ablation.    PRN Procedures:   1. Palliative cervical epidural steroid injection under fluoroscopic guidance, no sedation.  2. Palliative thoracic epidural steroid injection under fluoroscopic guidance, no sedation.  3. Palliative lumbar epidural steroid injection under fluoroscopic guidance, no sedation.  4. Diagnostic bilateral lumbar facet block under fluoroscopic guidance and IV sedation.    Referral(s) or Consult(s): None at this time.  Medications administered during this visit: Rick. Palmeri had no medications administered during this visit.  Requested PM Follow-up: Return for Keep previously scheduled appointment., Return after ordered test(s).Marland Kitchen  Future Appointments Date Time Provider [place]  04/01/2016 1:20 PM Milinda Pointer, MD [facility] None    Primary Care Physician: Pcp Not In System Location: [facility] Note by: Kathlen Brunswick. Dossie Arbour, M.D, DABA, DABAPM, DABPM, DABIPP, FIPP  Pain Score Disclaimer: We use the NRS-11 scale. This is a self-reported, subjective measurement of pain severity with only modest accuracy. It is used primarily to identify changes within a particular patient. It must be understood that outpatient pain scales are significantly less accurate that those used  for research, where they can be applied under ideal controlled circumstances with minimal exposure to variables. In reality, the score is likely to be a combination of pain intensity and pain affect, where pain affect describes the degree of emotional arousal or changes in action readiness caused by the sensory experience of pain. Factors such as social and work situation, setting, emotional state, anxiety levels, expectation, and prior pain experience may influence pain perception and show large inter-individual differences that may also be affected by time variables.  Patient instructions provided during this appointment: Patient Instructions  Smoking Cessation, Tips for Success If you are ready to quit smoking, congratulations! You have chosen to help yourself be healthier. Cigarettes bring nicotine, tar, carbon monoxide, and other irritants into your body. Your lungs, heart, and blood vessels will be able to work better without these poisons. There are many different ways to quit smoking. Nicotine gum, nicotine patches, a nicotine inhaler, or nicotine nasal spray can help with physical craving. Hypnosis, support groups, and medicines help break the habit of smoking. WHAT THINGS CAN I DO TO MAKE QUITTING EASIER?  Here are some tips to help you quit for good:  Pick a date when you will quit smoking completely. Tell all of your friends and family about your plan to quit on that date.  Do not try to slowly cut down on the number of cigarettes you are smoking. Pick a quit date and quit smoking completely starting on that day.  Throw away all cigarettes.   Clean and remove all ashtrays from your home, work, and car.  On a card, write down your reasons for quitting. Carry the card with you and read it when you get the urge to smoke.  Cleanse your body of nicotine. Drink enough water and fluids to keep your urine clear or pale yellow. Do this after quitting to flush the nicotine from your  body.  Learn to predict your moods. Do not let a bad situation be your excuse to have a cigarette. Some situations in your life might tempt you into wanting a cigarette.  Never have "just one" cigarette. It leads to wanting another and another. Remind yourself of your decision to quit.  Change habits associated with smoking. If you smoked while driving or when feeling stressed, try other activities to replace smoking. Stand up when drinking your coffee. Brush your teeth after eating. Sit in a different chair when you read the paper. Avoid alcohol while trying to quit, and try to drink fewer caffeinated beverages. Alcohol and caffeine may urge you to smoke.  Avoid foods and drinks that can trigger a desire to smoke, such as sugary or spicy foods and alcohol.  Ask people who smoke not to smoke around you.  Have something planned to do right after eating or having a cup of coffee. For example, plan to take a walk or exercise.  Try a relaxation exercise to calm you down and decrease your stress.  Remember, you may be tense and nervous for the first 2 weeks after you quit, but this will pass.  Find new activities to keep your hands busy. Play with a pen, coin, or rubber band. Doodle or draw things on paper.  Brush your teeth right after eating. This will help cut down on the craving for the taste of tobacco after meals. You can also try mouthwash.   Use oral substitutes in place of cigarettes. Try using lemon drops, carrots, cinnamon sticks, or chewing gum. Keep them handy so they are available when you have the urge to smoke.  When you have the urge to smoke, try deep breathing.  Designate your home as a nonsmoking area.  If you are a heavy smoker, ask your health care provider about a prescription for nicotine chewing gum. It can ease your withdrawal from nicotine.  Reward yourself. Set aside the cigarette money you save and buy yourself something nice.  Look for support from others. Join  a support group or smoking cessation program. Ask someone at home or at work to help you with your plan to quit smoking.  Always ask yourself, "Do I need this cigarette or is this just a reflex?" Tell yourself, "Today, I choose not to smoke," or "I do not want to smoke." You are reminding yourself of your decision to quit.  Do not replace cigarette smoking with electronic cigarettes (commonly called e-cigarettes). The safety of e-cigarettes is unknown, and some may contain harmful chemicals.  If you relapse, do not give up! Plan ahead and think about what you will do the next time you get the urge to smoke. HOW WILL I FEEL WHEN I QUIT SMOKING? You may have symptoms of withdrawal because your body is used to nicotine (the addictive substance in cigarettes). You may crave cigarettes, be irritable, feel very hungry, cough often, get headaches, or have difficulty concentrating. The withdrawal symptoms are only temporary. They are strongest when you first quit but will go away within 10-14 days. When withdrawal symptoms occur, stay in control. Think about your reasons for quitting. Remind yourself that these are signs that your body is healing and getting used to being without cigarettes. Remember that withdrawal symptoms are easier to treat than the major diseases that smoking can cause.  Even after the withdrawal is over, expect periodic urges to smoke. However, these cravings are generally short lived and will go away whether you smoke or not. Do not smoke! WHAT RESOURCES ARE AVAILABLE TO HELP ME QUIT SMOKING? Your health care provider can direct you to community resources or hospitals for support, which may include:  Group support.  Education.  Hypnosis.  Therapy.   This information is not intended to replace advice given to you by your health care provider. Make sure you discuss any questions you have with your health care provider.   Document Released: 06/28/2004 Document Revised: 10/21/2014  Document Reviewed: 03/18/2013 Elsevier Interactive Patient Education [employer].

## 2016-03-08 ENCOUNTER — Ambulatory Visit: Payer: Medicare Other

## 2016-03-27 ENCOUNTER — Ambulatory Visit
Admission: RE | Admit: 2016-03-27 | Discharge: 2016-03-27 | Disposition: A | Discharge status: Home / Self Care | Payer: Medicare Other | Source: Ambulatory Visit | Attending: Pain Medicine | Admitting: Pain Medicine

## 2016-03-27 DIAGNOSIS — Z981 Arthrodesis status: Secondary | ICD-10-CM | POA: Diagnosis not present

## 2016-03-27 DIAGNOSIS — M4312 Spondylolisthesis, cervical region: Secondary | ICD-10-CM | POA: Insufficient documentation

## 2016-03-27 DIAGNOSIS — M4802 Spinal stenosis, cervical region: Secondary | ICD-10-CM | POA: Insufficient documentation

## 2016-03-27 DIAGNOSIS — M2578 Osteophyte, vertebrae: Secondary | ICD-10-CM | POA: Insufficient documentation

## 2016-03-27 DIAGNOSIS — R221 Localized swelling, mass and lump, neck: Secondary | ICD-10-CM | POA: Diagnosis not present

## 2016-03-27 DIAGNOSIS — M5412 Radiculopathy, cervical region: Secondary | ICD-10-CM

## 2016-03-27 DIAGNOSIS — G8929 Other chronic pain: Secondary | ICD-10-CM | POA: Diagnosis present

## 2016-04-01 ENCOUNTER — Ambulatory Visit: Payer: Medicare Other | Attending: Pain Medicine | Admitting: Pain Medicine

## 2016-04-01 ENCOUNTER — Encounter: Payer: Self-pay | Admitting: Pain Medicine

## 2016-04-01 VITALS — BP 143/84 | HR 64 | Temp 98.4°F | Resp 16 | Ht 69.0 in | Wt 158.0 lb

## 2016-04-01 DIAGNOSIS — E785 Hyperlipidemia, unspecified: Secondary | ICD-10-CM | POA: Insufficient documentation

## 2016-04-01 DIAGNOSIS — M4312 Spondylolisthesis, cervical region: Secondary | ICD-10-CM | POA: Diagnosis not present

## 2016-04-01 DIAGNOSIS — G8929 Other chronic pain: Secondary | ICD-10-CM | POA: Insufficient documentation

## 2016-04-01 DIAGNOSIS — Z5181 Encounter for therapeutic drug level monitoring: Secondary | ICD-10-CM | POA: Diagnosis not present

## 2016-04-01 DIAGNOSIS — K5730 Diverticulosis of large intestine without perforation or abscess without bleeding: Secondary | ICD-10-CM | POA: Diagnosis not present

## 2016-04-01 DIAGNOSIS — M2578 Osteophyte, vertebrae: Secondary | ICD-10-CM | POA: Diagnosis not present

## 2016-04-01 DIAGNOSIS — M549 Dorsalgia, unspecified: Secondary | ICD-10-CM | POA: Diagnosis present

## 2016-04-01 DIAGNOSIS — J449 Chronic obstructive pulmonary disease, unspecified: Secondary | ICD-10-CM | POA: Diagnosis not present

## 2016-04-01 DIAGNOSIS — M5414 Radiculopathy, thoracic region: Secondary | ICD-10-CM

## 2016-04-01 DIAGNOSIS — M4802 Spinal stenosis, cervical region: Secondary | ICD-10-CM | POA: Diagnosis not present

## 2016-04-01 DIAGNOSIS — D379 Neoplasm of uncertain behavior of digestive organ, unspecified: Secondary | ICD-10-CM

## 2016-04-01 DIAGNOSIS — Z79891 Long term (current) use of opiate analgesic: Secondary | ICD-10-CM | POA: Diagnosis not present

## 2016-04-01 DIAGNOSIS — M5030 Other cervical disc degeneration, unspecified cervical region: Secondary | ICD-10-CM | POA: Insufficient documentation

## 2016-04-01 DIAGNOSIS — I10 Essential (primary) hypertension: Secondary | ICD-10-CM | POA: Insufficient documentation

## 2016-04-01 DIAGNOSIS — D490 Neoplasm of unspecified behavior of digestive system: Secondary | ICD-10-CM

## 2016-04-01 DIAGNOSIS — F319 Bipolar disorder, unspecified: Secondary | ICD-10-CM | POA: Insufficient documentation

## 2016-04-01 DIAGNOSIS — M545 Low back pain: Secondary | ICD-10-CM | POA: Insufficient documentation

## 2016-04-01 DIAGNOSIS — M797 Fibromyalgia: Secondary | ICD-10-CM | POA: Diagnosis not present

## 2016-04-01 DIAGNOSIS — K5903 Drug induced constipation: Secondary | ICD-10-CM | POA: Insufficient documentation

## 2016-04-01 DIAGNOSIS — M65331 Trigger finger, right middle finger: Secondary | ICD-10-CM | POA: Diagnosis not present

## 2016-04-01 DIAGNOSIS — M47816 Spondylosis without myelopathy or radiculopathy, lumbar region: Secondary | ICD-10-CM | POA: Diagnosis not present

## 2016-04-01 DIAGNOSIS — M5416 Radiculopathy, lumbar region: Secondary | ICD-10-CM

## 2016-04-01 DIAGNOSIS — M4806 Spinal stenosis, lumbar region: Secondary | ICD-10-CM | POA: Diagnosis not present

## 2016-04-01 DIAGNOSIS — M50222 Other cervical disc displacement at C5-C6 level: Secondary | ICD-10-CM | POA: Insufficient documentation

## 2016-04-01 DIAGNOSIS — I70209 Unspecified atherosclerosis of native arteries of extremities, unspecified extremity: Secondary | ICD-10-CM | POA: Insufficient documentation

## 2016-04-01 DIAGNOSIS — M5412 Radiculopathy, cervical region: Secondary | ICD-10-CM

## 2016-04-01 DIAGNOSIS — M47812 Spondylosis without myelopathy or radiculopathy, cervical region: Secondary | ICD-10-CM | POA: Diagnosis not present

## 2016-04-01 DIAGNOSIS — M5410 Radiculopathy, site unspecified: Secondary | ICD-10-CM

## 2016-04-01 DIAGNOSIS — M546 Pain in thoracic spine: Secondary | ICD-10-CM | POA: Diagnosis not present

## 2016-04-01 DIAGNOSIS — M542 Cervicalgia: Secondary | ICD-10-CM | POA: Diagnosis present

## 2016-04-01 DIAGNOSIS — I4891 Unspecified atrial fibrillation: Secondary | ICD-10-CM | POA: Diagnosis not present

## 2016-04-01 DIAGNOSIS — D110 Benign neoplasm of parotid gland: Secondary | ICD-10-CM | POA: Diagnosis not present

## 2016-04-01 DIAGNOSIS — F17210 Nicotine dependence, cigarettes, uncomplicated: Secondary | ICD-10-CM | POA: Diagnosis not present

## 2016-04-01 MED ORDER — OXYCODONE-ACETAMINOPHEN 5-325 MG PO TABS: 1.0000 | ORAL_TABLET | Freq: Four times a day (QID) | ORAL | Status: DC | PRN

## 2016-04-01 MED ORDER — PREGABALIN 150 MG PO CAPS: 150.0000 mg | ORAL_CAPSULE | Freq: Two times a day (BID) | ORAL | Status: DC

## 2016-04-01 NOTE — Assessment & Plan Note (Signed)
Patient referred to ENT for full workup.

## 2016-04-01 NOTE — Progress Notes (Signed)
Patient's Name: Rick VASSAR Sr.  Patient type: Established  MRN: [id number]  Service setting: Ambulatory outpatient  DOB: 1950-12-20  Location: [facility]  DOS: 04/01/2016  Primary Care Physician: Pcp Not In System  Note by: Constantin Hillery A. Dossie Arbour, M.D, DABA, DABAPM, DABPM, DABIPP, FIPP  Referring Physician: No ref. provider found  Specialty: Board-Certified Interventional Pain Management  Last Visit to Pain Management: 03/07/2016   Primary Reason(s) for Visit: Encounter for prescription drug management (Level of risk: moderate) CC: Back Pain and Neck Pain   HPI  Rick Carter is a 65 y.o. year old, male patient, who returns today as an established patient. 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[place]); Atrial fibrillation ([place]); Bipolar affective disorder ([place]); Chronic obstructive pulmonary disease ([place]); Degeneration of intervertebral disc of cervical region; Colon, diverticulosis; Acid reflux; Essential (primary) hypertension; HLD (hyperlipidemia); Breath shortness; Compulsive tobacco user syndrome; Diverticular disease of large intestine; Post-thoracotomy pain syndrome; Encounter for therapeutic drug level monitoring; Long term current use of opiate analgesic; Long term prescription opiate use; Uncomplicated opioid dependence ([place]); Opiate use; Fibromyalgia; Chronic pain syndrome; Chronic pain; Lumbar facet syndrome (Location of Primary Source of Pain) (Bilateral) (R>L); History of alcoholism ([place]); Chronic low back pain (Location of Primary Source of Pain) (midline); Lumbar spondylosis; Cervical spondylosis; Failed cervical surgery syndrome; Nicotine dependence; Tobacco abuse; Substance Use Disorder Risk: High; History of right-sided partial pneumonectomy; Atherosclerotic peripheral vascular disease ([place]); Musculoskeletal pain; Myofascial pain; Neurogenic pain; Neuropathic pain; Opioid-induced constipation (OIC); Osteoarthritis; NSAID induced gastritis; Trigger  finger of right hand (middle finger); Thoracic spine pain; Thoracic radicular pain (Right); Chronic lumbar radicular pain (L5/S1 dermatome) (Location of Secondary source of pain) (Right); Lumbar foraminal stenosis (L5-S1) (Right); Lumbar facet arthropathy (Bilateral) (R>L); Lumbar disc protrusion (L3-4) (Right); Chronic cervical radicular pain (Left); and 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 His primarily concern today is the Back Pain and Neck Pain   Pain Assessment: Self-Reported Pain Score: 4  Reported level is compatible with observation       Pain Type: Chronic pain Pain Location: Neck (back pain) Pain Orientation: Lower Pain Descriptors / Indicators: Aching, Constant, Radiating Pain Frequency: Constant  The patient comes into the clinics today for pharmacological management of his chronic pain. I last saw this patient on 03/07/2016. The patient  has no drug history on file. His body mass index is 23.32 kg/(m^2).   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 The patient will be referred to ENT for for workup.  Date of Last Visit: 03/07/16 Service Provided on Last Visit: Med Refill (follow-up from procedure)  Controlled Substance Pharmacotherapy Assessment & REMS (Risk Evaluation and Mitigation Strategy)  Analgesic: Oxycodone/APAP 5/325 one every 6 hours (20 mg/day) Pill Count: Oxycodone 5/325 mg #35/120 MME/day: 30 mg/day Pharmacokinetics: Onset of action (Liberation/Absorption): Within expected pharmacological parameters Time to Peak effect (Distribution): Timing and results are as within normal expected parameters Duration of action (Metabolism/Excretion): Within normal limits for medication Pharmacodynamics: Analgesic Effect: More than 50% Activity Facilitation: Medication(s) allow patient to sit, stand, walk, and do the basic ADLs Perceived Effectiveness: Described as relatively effective, allowing for increase in activities of daily living  (ADL) Side-effects or Adverse reactions: None reported Monitoring: [place] PMP: Online review of the past 29-monthperiod conducted. Compliant with practice rules and regulations Last UDS on record: TOXASSURE SELECT 13  Date Value Ref Range Status  12/27/2015 FINAL  Final    Comment:    ====================================================================  TOXASSURE SELECT 13 (MW) ==================================================================== Test                             Result       Flag       Units Drug Present and Declared for Prescription Verification   Oxycodone                      615          EXPECTED   ng/mg creat   Oxymorphone                    3848         EXPECTED   ng/mg creat   Noroxycodone                   2629         EXPECTED   ng/mg creat   Noroxymorphone                 775          EXPECTED   ng/mg creat    Sources of oxycodone are scheduled prescription medications.    Oxymorphone, noroxycodone, and noroxymorphone are expected    metabolites of oxycodone. Oxymorphone is also available as a    scheduled prescription medication. ==================================================================== Test                      Result    Flag   Units      Ref Range   Creatinine              52               mg/dL      >=20 ==================================================================== Declared Medications:  The flagging and interpretation on this report are based on the  following declared medications.  Unexpected results may arise from  inaccuracies in the declared medications.  **Note: The testing scope of this panel includes these medications:  Oxycodone (Oxycodone Acetaminophen)  **Note: The testing scope of this panel does not include following  reported medications:  Acetaminophen (Oxycodone Acetaminophen)  Albuterol  Aspirin  Diphenhydramine (Benadryl)  Docusate (Colace)  Fluticasone (Advair)  Hydrochlorothiazide (Hydrodiuril)  Ibuprofen  (Advil)  Indeterminate drug  Magnesium (Mag-Ox)  Metformin (Glucophage)  Methocarbamol (Robaxin)  Pantoprazole (Protonix)  Pregabalin (Lyrica)  Salmeterol (Advair)  Trazodone (Desyrel)  Vitamin D ==================================================================== For clinical consultation, please call [phone]. ====================================================================    UDS interpretation: Compliant          Medication Assessment Form: Reviewed. Patient indicates being compliant with therapy Treatment compliance: Compliant Risk Assessment: Aberrant Behavior: None observed today Substance Use Disorder (SUD) Risk Level: Moderate-to-high Risk of opioid abuse or dependence: 0.7-3.0% with doses ? 36 MME/day and 6.1-26% with doses ? 120 MME/day. Opioid Risk Tool (ORT) Score:  9 High Risk for Opioid Abuse (Score >8) Depression Scale Score: PHQ-2: PHQ-2 Total Score: 2 21.1% Probability of major depressive disorder (2) PHQ-9: PHQ-9 Total Score: 3 No depression (0-4)  Pharmacologic Plan: No change in therapy, at this time  Laboratory Chemistry  Inflammation Markers Lab Results  Component Value Date   ESRSEDRATE 1 10/10/2015   CRP 0.8 10/10/2015    Renal Function Lab Results  Component Value Date   BUN 13 10/10/2015   CREATININE 0.82 10/10/2015   GFRAA >60 10/10/2015   GFRNONAA >60 10/10/2015    Hepatic Function  Lab Results  Component Value Date   AST 21 10/10/2015   ALT 16* 10/10/2015   ALBUMIN 4.5 10/10/2015    Electrolytes Lab Results  Component Value Date   NA 136 10/10/2015   K 3.5 10/10/2015   CL 98* 10/10/2015   CALCIUM 9.8 10/10/2015   MG 2.3 10/10/2015    Pain Modulating Vitamins No results found for: VD25OH, VD125OH2TOT, CL2751ZG0, FV4944HQ7, VITAMINB12  Coagulation Parameters No results found for: INR, LABPROT, APTT, PLT  Note: Labs Reviewed.  Recent Diagnostic Imaging  Mr Cervical Spine Wo Carter  03/27/2016  CLINICAL  DATA:  Neck pain and left shoulder arm pain and left-sided chest pain. Anterior cervical fusion at C4-5. EXAM: MRI CERVICAL SPINE WITHOUT Carter TECHNIQUE: Multiplanar, multisequence MR imaging of the cervical spine was performed. No intravenous Carter was administered. COMPARISON:  MRI dated 03/01/2014 FINDINGS: Alignment: 2.5 mm retrolisthesis of C3 on C4.  Otherwise normal. Vertebrae: Solid anterior fusion at C4-5. No significant facet arthritis in the cervical spine. No other significant abnormality. Cord: No mass lesion or myelopathy. 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 Disc levels: Craniocervical junction through C2-3:  Normal. C3-4: Retrolisthesis with a broad-based disc osteophyte complex with osteophytes which slightly narrow the right neural foramen. AP dimension of the spinal canal is narrowed to 7 mm and the spinal cord is slightly compressed without myelopathy, unchanged since the prior study. C4-5:  Solid anterior fusion with no residual impingement. C5-6: Small central disc bulge without neural impingement, unchanged. No foraminal stenosis. C6-7: Chronic disc space narrowing. Minimal endplate osteophytes extend into the left neural foramen without significant foraminal stenosis. Asymmetric osteophytes slightly encroach upon the left side of the thecal sac but this is less apparent than on the prior study. This might irritate the left C7 nerve. There is no residual soft disc component. C7-T1:  Normal. IMPRESSION: 1. Slight cervical spinal stenosis at C3-4 without myelopathy. No change since the prior study. 2. Osteophytes slightly encroach upon the left side of the thecal sac at C6-7, less prominent on the prior study. No residual soft tissue component as was apparent on the prior study. 3. 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 Electronically Signed   By: Lorriane Shire M.D.   On: 03/27/2016 13:02   Cervical Imaging: Cervical MR wo Carter:  Results for orders placed during the hospital encounter of 03/27/16  MR Cervical Spine Wo Carter   Narrative CLINICAL DATA:  Neck pain and left shoulder arm pain and left-sided chest pain. Anterior cervical fusion at C4-5.  EXAM: MRI CERVICAL SPINE WITHOUT Carter  TECHNIQUE: Multiplanar, multisequence MR imaging of the cervical spine was performed. No intravenous Carter was administered.  COMPARISON:  MRI dated 03/01/2014  FINDINGS: Alignment: 2.5 mm retrolisthesis of C3 on C4.  Otherwise normal.  Vertebrae: Solid anterior fusion at C4-5. No significant facet arthritis in the cervical spine. No other significant abnormality.  Cord: No mass lesion or myelopathy.  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  Disc levels:  Craniocervical junction through C2-3:  Normal.  C3-4: Retrolisthesis with a broad-based disc osteophyte complex with osteophytes  which slightly narrow the right neural foramen. AP dimension of the spinal canal is narrowed to 7 mm and the spinal cord is slightly compressed without myelopathy, unchanged since the prior study.  C4-5:  Solid anterior fusion with no residual impingement.  C5-6: Small central disc bulge without neural impingement, unchanged. No foraminal stenosis.  C6-7: Chronic disc space narrowing. Minimal endplate osteophytes extend into the left neural foramen without significant foraminal stenosis. Asymmetric osteophytes slightly encroach upon the left side of the thecal sac but this is less apparent than on the prior study. This might irritate the left C7 nerve. There is  no residual soft disc component.  C7-T1:  Normal.  IMPRESSION: 1. Slight cervical spinal stenosis at C3-4 without myelopathy. No change since the prior study. 2. Osteophytes slightly encroach upon the left side of the thecal sac at C6-7, less prominent on the prior study. No residual soft tissue component as was apparent on the prior study. 3. 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   Electronically Signed   By: Lorriane Shire M.D.   On: 03/27/2016 13:02    Cervical MR wo Carter:  Results for orders placed in visit on 03/01/14  MR C Spine Ltd W/O Cm   Narrative * PRIOR REPORT IMPORTED FROM AN EXTERNAL SYSTEM *   CLINICAL DATA:  Neck and left arm pain.   EXAM:  MRI CERVICAL SPINE WITHOUT Carter   TECHNIQUE:  Multiplanar, multisequence MR imaging of the cervical spine was  performed. No intravenous Carter was administered.   COMPARISON:  Cervical spine radiographs 06/25/2012   FINDINGS:  Normal overall alignment of the cervical vertebral bodies. There is  mild degenerative anterior subluxation of C4 compared with C3.  Anterior and interbody fusion changes noted at C4-5. The vertebral  bodies demonstrate normal marrow signal. The cervical spinal cord  demonstrates normal signal intensity. No Chiari malformation. The  facets are normally aligned. Mild facet disease.   C2-3:  No significant findings.   C3-4: Broad-based central disc protrusion and osteophytic ridging  with mild flattening of the ventral thecal sac. There is also mild  left foraminal stenosis due to uncinate spurring and left-sided  facet disease.   C4-5: Anterior and interbody fusion changes. No spinal or foraminal  stenosis.   C5-6: Diffuse annular bulge with central focality and mild  impression on the ventral thecal sac. No foraminal stenosis.   C6-7: Broad-based left paracentral and medial foraminal disc  protrusion  with mass effect on the left side of thecal sac and  likely effecting the left C7 nerve root.   C7-T1:  No significant findings.   IMPRESSION:  1. Shallow broad-based and disc protrusion and osteophytic ridging  at C3-4 with flattening of the ventral thecal sac. There is also  mild left foraminal stenosis.  2. Broad-based left paracentral and medial foraminal disc protrusion  at C6-7 likely effecting the left C7 nerve root.    Electronically Signed    By: Kalman Jewels M.D.    On: 03/01/2014 16:06       Cervical DG complete:  Results for orders placed in visit on 06/25/12  DG Cervical Spine Complete   Narrative * PRIOR REPORT IMPORTED FROM AN EXTERNAL SYSTEM *   PRIOR REPORT IMPORTED FROM THE [facility] WORKFLOW SYSTEM   REASON FOR EXAM:    neck  pain and Cervical radiculits  COMMENTS:   PROCEDURE:     DXR -  DXR CERVICAL SPINE COMPLETE  - Jun 25 2012 11:18AM   RESULT:     Degenerative changes cervical spine. Patient's undergone prior  fusion of C4-C5. Diffuse osteopenia noted. Scoliosis noted.   IMPRESSION:      Postsurgical and degenerative change cervical spine. No  acute abnormality noted. There is minimal mild bilateral narrowing of the  neural foramen.       Thoracic Imaging: Thoracic MR wo Carter:  Results for orders placed during the hospital encounter of 01/10/16  MR Thoracic Spine Wo Carter   Narrative CLINICAL DATA:  Upper back pain and bilateral arm pain for 8-9 months  EXAM: MRI THORACIC SPINE WITHOUT Carter  TECHNIQUE: Multiplanar, multisequence MR imaging of the thoracic spine was performed. No intravenous Carter was administered.  COMPARISON:  None.  FINDINGS: The vertebral body heights are maintained. The alignment is anatomic. There is no acute fracture or static listhesis. The marrow signal is normal. The thoracic spinal cord is normal in size and signal. Mild degenerative disc disease at C6-7.  T1-T2: No significant disc protrusion  or central canal stenosis. Mild right foraminal stenosis. No left foraminal stenosis.  T2-T3: No disc protrusion, foraminal stenosis or central canal stenosis.  T3-T4: No disc protrusion, foraminal stenosis or central canal stenosis.  T4-T5: No disc protrusion, foraminal stenosis or central canal stenosis.  T5-T6: No disc protrusion, foraminal stenosis or central canal stenosis.  T6-T7: No disc protrusion, foraminal stenosis or central canal stenosis.  T7-T8: No disc protrusion, foraminal stenosis or central canal stenosis.  T8-T9: Small central disc protrusion. No foraminal or central canal stenosis.  T9-T10: No disc protrusion, foraminal stenosis or central canal stenosis.  T10-T11: No disc protrusion, foraminal stenosis or central canal stenosis.  T11-T12: No disc protrusion, foraminal stenosis or central canal stenosis.  IMPRESSION: 1. No acute osseous injury of the thoracic spine. 2. Small central disc protrusion at T8-9.   Electronically Signed   By: Kathreen Devoid   On: 01/10/2016 10:34    Thoracic DG 2-3 views:  Results for orders placed during the hospital encounter of 10/10/15  DG Thoracic Spine 2 View   Narrative CLINICAL DATA:  Chronic back pain.  EXAM: THORACIC SPINE 2 VIEWS  COMPARISON:  None.  FINDINGS: Normal alignment. Minimal for early degenerative spurring anteriorly. No fracture or subluxation.  IMPRESSION: No acute bony abnormality.   Electronically Signed   By: Rolm Baptise M.D.   On: 10/10/2015 13:14    Lumbosacral Imaging: Lumbar MR wo Carter:  Results for orders placed in visit on 03/01/14  MR L Spine Ltd W/O Cm   Narrative * PRIOR REPORT IMPORTED FROM AN EXTERNAL SYSTEM *   CLINICAL DATA:  Low back pain.  Right leg numbness   EXAM:  MRI LUMBAR SPINE WITHOUT Carter   TECHNIQUE:  Multiplanar, multisequence MR imaging of the lumbar spine was  performed. No intravenous Carter was administered.   COMPARISON:   Lumbar MRI 02/21/2011   FINDINGS:  S1 is a transitional vertebra with a partially developed disc space  at S1-S2. This is consistent with the prior MRI report.   Normal lumbar alignment. Negative for fracture or mass. Conus  medullaris is normal and terminates head T12-L1.   L1-2:  Negative   L2-3:  Negative   L3-4: Small right paracentral disc protrusion shows slight  progression since the prior study. The disc protrusion is slightly  larger, now with slight cranial migration of disc material in the  epidural space. Mild facet degeneration. No  significant spinal  stenosis. Neural foramina are patent.   L4-5: Mild spondylosis and mild facet degeneration without  significant spinal stenosis   L5-S1: Disc degeneration with moderate to advanced spondylosis.  There is right foraminal encroachment with impingement of the right  L5 nerve root. This is unchanged. Mild left foraminal encroachment  also noted.   IMPRESSION:  L3-4 right paracentral disc protrusion shows slight interval  enlargement but without significant neural impingement.   L5-S1 moderate to advanced spondylosis with marked right foraminal  encroachment unchanged.    Electronically Signed    By: Franchot Gallo M.D.    On: 03/01/2014 16:17       Lumbar MR w/wo Carter:  Results for orders placed in visit on 02/21/11  MR Lumbar Spine W Wo Carter   Narrative * PRIOR REPORT IMPORTED FROM AN EXTERNAL SYSTEM *   PRIOR REPORT IMPORTED FROM THE [facility] WORKFLOW SYSTEM   REASON FOR EXAM:    low back pain lumbar radiculitis  COMMENTS:   PROCEDURE:     MR  - MR LUMBAR SPINE WO/W  - Feb 21 2011  2:18PM   RESULT:   History: Low back pain.   Comparison Study: No prior.   Findings: Multiplanar, multisequence imaging of the lumbar spine is  obtained. No acute bony abnormalities are identified. MultiHance was  administered to the patient. No evidence of enhancing abnormality. No  evidence of disc enhancement.    At [place], there is no significant abnormality.   At L2-L3, there is no significant abnormality.   At L3-L4, a tiny central disc protrusion and annular bulge is noted. No  high-grade spinal stenosis or neural foraminal narrowing.   At L4-L5, there is no significant disc protrusion or spinal stenosis.   At L5-S1, annular bulge is present with mild bilateral neural foraminal  narrowing. Small central disc protrusion is noted. There is no spinal  stenosis. No acute bony abnormalities are identified. The lumbar cord is  normal.   IMPRESSION:      Multilevel disc degeneration with level specific findings  as above.   Thank you for the opportunity to contribute to the care of your patient.       Note: Imaging reviewed. Results explained to patient in Layman's terms.  Meds  The patient has a current medication list which includes the following prescription(s): aclidinium bromide, advair diskus, albuterol sulfate, aspirin ec, calcium carb-cholecalciferol, cholecalciferol, docusate sodium, fluticasone-salmeterol, hydrochlorothiazide, hydroxyzine, ibuprofen, loratadine, metformin, multiple vitamins-minerals, oxycodone-acetaminophen, oxycodone-acetaminophen, oxycodone-acetaminophen, pantoprazole, pregabalin, trazodone, tudorza pressair, and ventolin hfa.  Current Outpatient Prescriptions on File Prior to Visit  Medication Sig  . Aclidinium Bromide (TUDORZA PRESSAIR IN) Inhale 40 mcg into the lungs 2 (two) times daily.  Marland Kitchen ADVAIR DISKUS 250-50 MCG/DOSE AEPB INHALE 1 PUFF 2 (TWO) TIMES A DAY.  Marland Kitchen Albuterol Sulfate (VENTOLIN HFA IN) Inhale 1 puff into the lungs.  Marland Kitchen aspirin EC 81 MG tablet Take 81 mg by mouth daily.  . Calcium Carb-Cholecalciferol (SM CALCIUM/VITAMIN D) 600-800 MG-UNIT TABS   . cholecalciferol (VITAMIN D) 1000 UNITS tablet Take 2,000 Units by mouth daily.  Marland Kitchen docusate sodium (COLACE) 100 MG capsule Take 1 capsule (100 mg total) by mouth daily.  . Fluticasone-Salmeterol (ADVAIR)  500-50 MCG/DOSE AEPB Inhale 1 puff into the lungs 2 (two) times daily.  . hydrochlorothiazide (MICROZIDE) 12.5 MG capsule TAKE ONE CAPSULE BY MOUTH FOR HIGH BLOOD PRESSURE  . hydrOXYzine (ATARAX/VISTARIL) 25 MG tablet Take 25 mg by mouth as needed.  Marland Kitchen ibuprofen (ADVIL,MOTRIN) 800  MG tablet Take 1 tablet (800 mg total) by mouth every 8 (eight) hours as needed for moderate pain.  Marland Kitchen loratadine (CLARITIN) 10 MG tablet Take 10 mg by mouth.  . metFORMIN (GLUCOPHAGE) 500 MG tablet Take 500 mg by mouth 2 (two) times daily with a meal.  . Multiple Vitamins-Minerals (PRESERVISION AREDS 2 PO) Take 1 tablet by mouth 2 (two) times daily.  . pantoprazole (PROTONIX) 40 MG tablet Take 40 mg by mouth.  . traZODone (DESYREL) 50 MG tablet Take 50 mg by mouth at bedtime. Reported on 02/20/2016  . TUDORZA PRESSAIR 400 MCG/ACT AEPB   . VENTOLIN HFA 108 (90 Base) MCG/ACT inhaler INHALE 2 PUFFS EVERY FOUR (4) HOURS AS NEEDED FOR SHORTNESS OF BREATH.   No current facility-administered medications on file prior to visit.    ROS  Constitutional: Denies any fever or chills Gastrointestinal: No reported hemesis, hematochezia, vomiting, or acute GI distress Musculoskeletal: Denies any acute onset joint swelling, redness, loss of ROM, or weakness Neurological: No reported episodes of acute onset apraxia, aphasia, dysarthria, agnosia, amnesia, paralysis, loss of coordination, or loss of consciousness  Allergies  Rick Carter is allergic to varenicline.  [place]  Medical:  Rick Carter  has a past medical history of Depression; Anxiety; COPD (chronic obstructive pulmonary disease) ([place]); Hypertension; History of alcoholism ([place]) (08/08/2015); and History of pneumonectomy (08/08/2015). Family: family history includes Alcohol abuse in his father; Kidney disease in his mother. Surgical:  has past surgical history that includes Tonsillectomy; Lung removal, partial; and Neck surgery. Tobacco:  reports that he has been smoking  Cigarettes.  He has a 51 pack-year smoking history. He does not have any smokeless tobacco history on file. Alcohol:  reports that he does not drink alcohol. Drug:  has no drug history on file.  Constitutional Exam  Vitals: Blood pressure 143/84, pulse 64, temperature 98.4 F (36.9 C), temperature source Oral, resp. rate 16, height _0  (1.753 m), weight 158 lb (71.668 kg), SpO2 96 %. General appearance: Well nourished, well developed, and well hydrated. In no acute distress Calculated BMI/Body habitus: Body mass index is 23.32 kg/(m^2). (25-29.9 kg/m2) Overweight - 20% higher incidence of chronic pain Psych/Mental status: Alert and oriented x 3 (person, place, & time) Eyes: PERLA Respiratory: No evidence of acute respiratory distress  Cervical Spine Exam  Inspection: No masses, redness, or swelling Alignment: Symmetrical ROM: Functional: ROM is within functional limits [facility]) Stability: No instability detected Muscle strength & Tone: Functionally intact Sensory: Unimpaired Palpation: No complaints of tenderness  Upper Extremity (UE) Exam    Side: Right upper extremity  Side: Left upper extremity  Inspection: No masses, redness, swelling, or asymmetry  Inspection: No masses, redness, swelling, or asymmetry  ROM:  ROM:  Functional: ROM is within functional limits [facility])  Functional: ROM is within functional limits [facility])  Muscle strength & Tone: Functionally intact  Muscle strength & Tone: Functionally intact  Sensory: Unimpaired  Sensory: Unimpaired  Palpation: Non-contributory  Palpation: Non-contributory   Thoracic Spine Exam  Inspection: No masses, redness, or swelling Alignment: Symmetrical ROM: Functional: ROM is within functional limits [facility]) Stability: No instability detected Sensory: Unimpaired Muscle strength & Tone: Functionally intact Palpation: No complaints of tenderness  Lumbar Spine Exam  Inspection: No masses, redness, or swelling Alignment:  Symmetrical ROM: Functional: ROM is within functional limits [facility]) Stability: No instability detected Muscle strength & Tone: Functionally intact Sensory: Unimpaired Palpation: No complaints of tenderness Provocative Tests: Lumbar Hyperextension and rotation test: deferred Patrick's Maneuver: deferred  Gait & Posture Assessment  Ambulation: Unassisted Gait: Unaffected Posture: WNL  Lower Extremity Exam    Side: Right lower extremity  Side: Left lower extremity  Inspection: No masses, redness, swelling, or asymmetry ROM:  Inspection: No masses, redness, swelling, or asymmetry ROM:  Functional: ROM is within functional limits [facility])  Functional: ROM is within functional limits [facility])  Muscle strength & Tone: Functionally intact  Muscle strength & Tone: Functionally intact  Sensory: Unimpaired  Sensory: Unimpaired  Palpation: Non-contributory  Palpation: Non-contributory   Assessment & Plan  Primary Diagnosis & Pertinent Problem List: The primary encounter diagnosis was Chronic pain. Diagnoses of Encounter for therapeutic drug level monitoring, Long term current use of opiate analgesic, Chronic low back pain (Location of Primary Source of Pain) (midline), Lumbar facet syndrome (Location of Primary Source of Pain) (Bilateral) (R>L), Chronic cervical radicular pain (Left), Fibromyalgia, Chronic lumbar radicular pain (L5/S1 dermatome) (Location of Secondary source of pain) (Right), Pleomorphic adenoma of parotid gland, Thoracic radicular pain (Right), Thoracic spine pain, and Tumor of parotid gland were also pertinent to this visit.  Visit Diagnosis: 1. Chronic pain   2. Encounter for therapeutic drug level monitoring   3. Long term current use of opiate analgesic   4. Chronic low back pain (Location of Primary Source of Pain) (midline)   5. Lumbar facet syndrome (Location of Primary Source of Pain) (Bilateral) (R>L)   6. Chronic cervical radicular pain (Left)   7. Fibromyalgia   8.  Chronic lumbar radicular pain (L5/S1 dermatome) (Location of Secondary source of pain) (Right)   9. Pleomorphic adenoma of parotid gland   10. Thoracic radicular pain (Right)   11. Thoracic spine pain   12. Tumor of parotid gland     Problems updated and reviewed during this visit: Problem  Tumor of Parotid Gland   Incidental finding on 03/27/2016 cervical MRI. 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     Problem-specific Plan(s): Tumor of parotid gland Patient referred to ENT for full workup.   No new assessment & plan notes have been filed under this hospital service since the last note was generated. Service: Pain Management   Plan of Care   Problem List Items Addressed This Visit      High   Chronic cervical radicular pain (Left) (Chronic)   Relevant Medications   pregabalin (LYRICA) 150 MG capsule   Other Relevant Orders   CERVICAL EPIDURAL STEROID INJECTION   Chronic low back pain (Location of Primary Source of Pain) (midline) (Chronic)   Relevant Medications   oxyCODONE-acetaminophen (PERCOCET/ROXICET) 5-325 MG tablet   oxyCODONE-acetaminophen (PERCOCET/ROXICET) 5-325 MG tablet   oxyCODONE-acetaminophen (PERCOCET/ROXICET) 5-325 MG tablet   Other Relevant Orders   LUMBAR FACET(MEDIAL BRANCH NERVE BLOCK) MBNB   Chronic lumbar radicular pain (L5/S1 dermatome) (Location of Secondary source of pain) (Right) (Chronic)   Relevant Orders   LUMBAR EPIDURAL STEROID INJECTION   Chronic pain - Primary (Chronic)   Relevant Medications   pregabalin (LYRICA) 150 MG capsule   oxyCODONE-acetaminophen (PERCOCET/ROXICET) 5-325 MG tablet   oxyCODONE-acetaminophen (PERCOCET/ROXICET) 5-325 MG tablet   oxyCODONE-acetaminophen (PERCOCET/ROXICET) 5-325 MG tablet   Fibromyalgia (Chronic)   Relevant Medications   pregabalin (LYRICA) 150 MG capsule   Lumbar facet syndrome (Location of Primary Source of Pain)  (Bilateral) (R>L) (Chronic)   Relevant Medications   oxyCODONE-acetaminophen (PERCOCET/ROXICET) 5-325 MG tablet   oxyCODONE-acetaminophen (PERCOCET/ROXICET) 5-325 MG tablet   oxyCODONE-acetaminophen (PERCOCET/ROXICET)  5-325 MG tablet   Other Relevant Orders   LUMBAR FACET(MEDIAL BRANCH NERVE BLOCK) MBNB   Thoracic radicular pain (Right) (Chronic)   Relevant Orders   THORACIC EPIDURAL STEROID INJECTION   Thoracic spine pain (Chronic)   Relevant Medications   oxyCODONE-acetaminophen (PERCOCET/ROXICET) 5-325 MG tablet   oxyCODONE-acetaminophen (PERCOCET/ROXICET) 5-325 MG tablet   oxyCODONE-acetaminophen (PERCOCET/ROXICET) 5-325 MG tablet   Other Relevant Orders   THORACIC EPIDURAL STEROID INJECTION   Tumor of parotid gland    Patient referred to ENT for full workup.      Relevant Orders   Ambulatory referral to ENT     Medium   Encounter for therapeutic drug level monitoring   Long term current use of opiate analgesic (Chronic)   Relevant Orders   ToxASSURE Select 13 (MW), Urine    Other Visit Diagnoses    Pleomorphic adenoma of parotid gland            Pharmacotherapy (Medications Ordered): Meds ordered this encounter  Medications  . pregabalin (LYRICA) 150 MG capsule    Sig: Take 1 capsule (150 mg total) by mouth 2 (two) times daily.    Dispense:  60 capsule    Refill:  2    Do not place this medication, or any other prescription from our practice, on "Automatic Refill". Patient may have prescription filled one day early if pharmacy is closed on scheduled refill date.  Marland Kitchen oxyCODONE-acetaminophen (PERCOCET/ROXICET) 5-325 MG tablet    Sig: Take 1 tablet by mouth every 6 (six) hours as needed for severe pain.    Dispense:  120 tablet    Refill:  0    Do not place this medication, or any other prescription from our practice, on "Automatic Refill". Patient may have prescription filled one day early if pharmacy is closed on scheduled refill date. Do not fill until:  04/03/16 To last until: 05/03/16  . oxyCODONE-acetaminophen (PERCOCET/ROXICET) 5-325 MG tablet    Sig: Take 1 tablet by mouth every 6 (six) hours as needed for severe pain.    Dispense:  120 tablet    Refill:  0    Do not place this medication, or any other prescription from our practice, on "Automatic Refill". Patient may have prescription filled one day early if pharmacy is closed on scheduled refill date. Do not fill until: 05/03/16 To last until: 06/02/16  . oxyCODONE-acetaminophen (PERCOCET/ROXICET) 5-325 MG tablet    Sig: Take 1 tablet by mouth every 6 (six) hours as needed for severe pain.    Dispense:  120 tablet    Refill:  0    Do not place this medication, or any other prescription from our practice, on "Automatic Refill". Patient may have prescription filled one day early if pharmacy is closed on scheduled refill date. Do not fill until: 06/02/16 To last until: 07/02/16    [facility] & Procedure Ordered: Orders Placed This Encounter  Procedures  . LUMBAR EPIDURAL STEROID INJECTION  . LUMBAR FACET(MEDIAL BRANCH NERVE BLOCK) MBNB  . CERVICAL EPIDURAL STEROID INJECTION  . THORACIC EPIDURAL STEROID INJECTION  . ToxASSURE Select 13 (MW), Urine  . Ambulatory referral to ENT    Imaging Ordered: AMB REFERRAL TO ENT  Interventional Therapies: Scheduled:  Right-sided T8-9 thoracic epidural steroid injection under fluoroscopic guidance, without IV sedation.    Considering:   1. Palliative bilateral lumbar facet block under fluoroscopic guidance and IV sedation.  2. Palliative left-sided cervical epidural steroid injection under fluoroscopic guidance, with or without sedation.  3. Palliative  right-sided L5-S1 lumbar epidural steroid injection under fluoroscopic guidance, with or without sedation.  4. Diagnostic bilateral cervical facet block under fluoroscopic guidance and IV sedation.  5. Palliative Right-sided T8-9 thoracic epidural steroid injection under fluoroscopic  guidance, without IV sedation.    PRN Procedures:   1. Palliative bilateral lumbar facet block under fluoroscopic guidance and IV sedation.  2. Palliative left-sided cervical epidural steroid injection under fluoroscopic guidance, with or without sedation.  3. Palliative right-sided L5-S1 lumbar epidural steroid injection under fluoroscopic guidance, with or without sedation.    Referral(s) or Consult(s): None at this time.  New Prescriptions   No medications on file    Medications administered during this visit: Rick Carter had no medications administered during this visit.  Requested PM Follow-up: Return in about 3 months (around 06/24/2016) for Procedure (Scheduled), Medication Management, (3-Mo), Procedure (PRN - Patient will call).  Future Appointments Date Time Provider [place]  04/02/2016 1:30 PM Milinda Pointer, MD [facility] None  06/24/2016 1:40 PM Milinda Pointer, MD [facility] None    Primary Care Physician: Pcp Not In System Location: [facility] Note by: Kathlen Brunswick. Dossie Arbour, M.D, DABA, DABAPM, DABPM, DABIPP, FIPP  Pain Score Disclaimer: We use the NRS-11 scale. This is a self-reported, subjective measurement of pain severity with only modest accuracy. It is used primarily to identify changes within a particular patient. It must be understood that outpatient pain scales are significantly less accurate that those used for research, where they can be applied under ideal controlled circumstances with minimal exposure to variables. In reality, the score is likely to be a combination of pain intensity and pain affect, where pain affect describes the degree of emotional arousal or changes in action readiness caused by the sensory experience of pain. Factors such as social and work situation, setting, emotional state, anxiety levels, expectation, and prior pain experience may influence pain perception and show large inter-individual differences  that may also be affected by time variables.  Patient instructions provided during this appointment: There are no Patient Instructions on file for this visit.

## 2016-04-01 NOTE — Progress Notes (Signed)
Oxycodone 5/325 mg     #35/120

## 2016-04-02 ENCOUNTER — Encounter: Payer: Self-pay | Admitting: Pain Medicine

## 2016-04-02 ENCOUNTER — Ambulatory Visit: Payer: Medicare Other | Attending: Pain Medicine | Admitting: Pain Medicine

## 2016-04-02 VITALS — BP 168/75 | HR 52 | Temp 98.4°F | Resp 16 | Ht 69.0 in | Wt 159.0 lb

## 2016-04-02 DIAGNOSIS — M5410 Radiculopathy, site unspecified: Secondary | ICD-10-CM | POA: Diagnosis not present

## 2016-04-02 DIAGNOSIS — M5030 Other cervical disc degeneration, unspecified cervical region: Secondary | ICD-10-CM | POA: Insufficient documentation

## 2016-04-02 DIAGNOSIS — M797 Fibromyalgia: Secondary | ICD-10-CM | POA: Diagnosis not present

## 2016-04-02 DIAGNOSIS — I4891 Unspecified atrial fibrillation: Secondary | ICD-10-CM | POA: Insufficient documentation

## 2016-04-02 DIAGNOSIS — F3189 Other bipolar disorder: Secondary | ICD-10-CM | POA: Diagnosis not present

## 2016-04-02 DIAGNOSIS — K2960 Other gastritis without bleeding: Secondary | ICD-10-CM | POA: Diagnosis not present

## 2016-04-02 DIAGNOSIS — K5730 Diverticulosis of large intestine without perforation or abscess without bleeding: Secondary | ICD-10-CM | POA: Diagnosis not present

## 2016-04-02 DIAGNOSIS — M5126 Other intervertebral disc displacement, lumbar region: Secondary | ICD-10-CM | POA: Insufficient documentation

## 2016-04-02 DIAGNOSIS — I70209 Unspecified atherosclerosis of native arteries of extremities, unspecified extremity: Secondary | ICD-10-CM | POA: Insufficient documentation

## 2016-04-02 DIAGNOSIS — D490 Neoplasm of unspecified behavior of digestive system: Secondary | ICD-10-CM | POA: Diagnosis not present

## 2016-04-02 DIAGNOSIS — M4806 Spinal stenosis, lumbar region: Secondary | ICD-10-CM | POA: Insufficient documentation

## 2016-04-02 DIAGNOSIS — F17210 Nicotine dependence, cigarettes, uncomplicated: Secondary | ICD-10-CM | POA: Diagnosis not present

## 2016-04-02 DIAGNOSIS — K5903 Drug induced constipation: Secondary | ICD-10-CM | POA: Diagnosis not present

## 2016-04-02 DIAGNOSIS — Z791 Long term (current) use of non-steroidal anti-inflammatories (NSAID): Secondary | ICD-10-CM | POA: Diagnosis not present

## 2016-04-02 DIAGNOSIS — E785 Hyperlipidemia, unspecified: Secondary | ICD-10-CM | POA: Diagnosis not present

## 2016-04-02 DIAGNOSIS — K219 Gastro-esophageal reflux disease without esophagitis: Secondary | ICD-10-CM | POA: Insufficient documentation

## 2016-04-02 DIAGNOSIS — M546 Pain in thoracic spine: Secondary | ICD-10-CM | POA: Diagnosis not present

## 2016-04-02 DIAGNOSIS — I10 Essential (primary) hypertension: Secondary | ICD-10-CM | POA: Diagnosis not present

## 2016-04-02 DIAGNOSIS — M65331 Trigger finger, right middle finger: Secondary | ICD-10-CM | POA: Insufficient documentation

## 2016-04-02 DIAGNOSIS — J449 Chronic obstructive pulmonary disease, unspecified: Secondary | ICD-10-CM | POA: Insufficient documentation

## 2016-04-02 DIAGNOSIS — M5414 Radiculopathy, thoracic region: Secondary | ICD-10-CM | POA: Insufficient documentation

## 2016-04-02 DIAGNOSIS — M549 Dorsalgia, unspecified: Secondary | ICD-10-CM | POA: Diagnosis present

## 2016-04-02 DIAGNOSIS — Z902 Acquired absence of lung [part of]: Secondary | ICD-10-CM | POA: Diagnosis not present

## 2016-04-02 MED ORDER — SODIUM CHLORIDE 0.9 % IJ SOLN
INTRAMUSCULAR | Status: AC
  Administered 2016-04-02: 15:00:00
  Filled 2016-04-02: qty 10

## 2016-04-02 MED ORDER — ROPIVACAINE HCL 2 MG/ML IJ SOLN
2.0000 mL | Freq: Once | INTRAMUSCULAR | Status: AC
  Administered 2016-04-02: 2 mL via EPIDURAL
  Filled 2016-04-02: qty 10

## 2016-04-02 MED ORDER — SODIUM CHLORIDE 0.9% FLUSH
2.0000 mL | Freq: Once | INTRAVENOUS | Status: AC
  Administered 2016-04-02: 2 mL

## 2016-04-02 MED ORDER — IOPAMIDOL (ISOVUE-M 200) INJECTION 41%
10.0000 mL | Freq: Once | INTRAMUSCULAR | Status: AC
  Administered 2016-04-02: 10 mL via EPIDURAL
  Filled 2016-04-02: qty 10

## 2016-04-02 MED ORDER — LIDOCAINE HCL (PF) 1 % IJ SOLN
10.0000 mL | Freq: Once | INTRAMUSCULAR | Status: AC
  Administered 2016-04-02: 10 mL
  Filled 2016-04-02: qty 10

## 2016-04-02 MED ORDER — DEXAMETHASONE SODIUM PHOSPHATE 10 MG/ML IJ SOLN
10.0000 mg | Freq: Once | INTRAMUSCULAR | Status: AC
  Administered 2016-04-02: 10 mg
  Filled 2016-04-02: qty 1

## 2016-04-02 NOTE — Progress Notes (Signed)
Patient's Name: Rick PRY Sr.  Patient type: Established  MRN: [id number]  Service setting: Ambulatory outpatient  DOB: 1951-04-29  Location: [facility]  DOS: 04/02/2016  Primary Care Physician: Pcp Not In System  Note by: Princess Karnes A. Dossie Arbour, M.D, DABA, DABAPM, DABPM, DABIPP, FIPP  Referring Physician: No ref. provider found  Specialty: Board-Certified Interventional Pain Management  Last Visit to Pain Management: 04/01/2016   Primary Reason(s) for Visit: Interventional Pain Management Treatment. CC: Back Pain  Primary Diagnosis: Radicular pain of thoracic region [M54.10]   Procedure:  Anesthesia, Analgesia, Anxiolysis:  Type: Therapeutic Inter-Laminar Thoracic Epidural Block Region: Posterior Thoracolumbar Level: T8-9 Laterality: Right-Sided Paraspinal  Indications: 1. Thoracic radicular pain (Right)   2. Thoracic spine pain     Pre-procedure Pain Score: 4/10  Reported level of pain is compatible with clinical observations Post-procedure Pain Score: 3   Type: Local Anesthesia Local Anesthetic: Lidocaine 1% Route: Infiltration ([place]/IM) IV Access: Declined Sedation: Declined  Indication(s): Analgesia        Pre-Procedure Assessment:  Rick Carter is a 64 y.o. year old, male patient, seen today for interventional treatment. 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[place]); Atrial fibrillation ([place]); Bipolar affective disorder ([place]); Chronic obstructive pulmonary disease ([place]); Degeneration of intervertebral disc of cervical region; Colon, diverticulosis; Acid reflux; Essential (primary) hypertension; HLD (hyperlipidemia); Breath shortness; Compulsive tobacco user syndrome; Diverticular disease of large intestine; Post-thoracotomy pain syndrome; Encounter for therapeutic drug level monitoring; Long term current use of opiate analgesic; Long term prescription opiate use; Uncomplicated opioid dependence ([place]); Opiate use; Fibromyalgia; Chronic pain  syndrome; Chronic pain; Lumbar facet syndrome (Location of Primary Source of Pain) (Bilateral) (R>L); History of alcoholism ([place]); Chronic low back pain (Location of Primary Source of Pain) (midline); Lumbar spondylosis; Cervical spondylosis; Failed cervical surgery syndrome; Nicotine dependence; Tobacco abuse; Substance Use Disorder Risk: High; History of right-sided partial pneumonectomy; Atherosclerotic peripheral vascular disease ([place]); Musculoskeletal pain; Myofascial pain; Neurogenic pain; Neuropathic pain; Opioid-induced constipation (OIC); Osteoarthritis; NSAID induced gastritis; Trigger finger of right hand (middle finger); Thoracic spine pain; Thoracic radicular pain (Right); Chronic lumbar radicular pain (L5/S1 dermatome) (Location of Secondary source of pain) (Right); Lumbar foraminal stenosis (L5-S1) (Right); Lumbar facet arthropathy (Bilateral) (R>L); Lumbar disc protrusion (L3-4) (Right); Chronic cervical radicular pain (Left); and 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 His primarily concern today is the Back Pain   Pain Type: Chronic pain Pain Location: Back Pain Orientation: Mid Pain Descriptors / Indicators: Aching, Constant Pain Frequency: Constant  Date of Last Visit: 04/01/16 Service Provided on Last Visit: Med Refill  Coagulation Parameters No results found for: INR, LABPROT, APTT, PLT  Verification of the correct person, correct site (including marking of site), and correct procedure were performed and confirmed by the patient.  Consent: Secured. Under the influence of no sedatives a written informed consent was obtained, after having provided information on the risks and possible complications. To fulfill our ethical and legal obligations, as recommended by the American Medical Association's Code of Ethics, we have provided information to the patient about our clinical impression; the nature and purpose of the treatment or procedure; the risks, benefits, and possible  complications of the intervention; alternatives; the risk(s) and benefit(s) of the alternative treatment(s) or procedure(s); and the risk(s) and benefit(s) of doing nothing. The patient was provided information about the risks and possible complications associated with the procedure. These include, but are not limited to, failure to achieve desired goals, infection, bleeding, organ or nerve damage,  allergic reactions, paralysis, and death. In the case of spinal procedures these may include, but are not limited to, failure to achieve desired goals, infection, bleeding, organ or nerve damage, allergic reactions, paralysis, and death. In addition, the patient was informed that Medicine is not an exact science; therefore, there is also the possibility of unforeseen risks and possible complications that may result in a catastrophic outcome. The patient indicated having understood very clearly. We have given the patient no guarantees and we have made no promises. Enough time was given to the patient to ask questions, all of which were answered to the patient's satisfaction.  Consent Attestation: I, the ordering provider, attest that I have discussed with the patient the benefits, risks, side-effects, alternatives, likelihood of achieving goals, and potential problems during recovery for the procedure that I have provided informed consent.  Pre-Procedure Preparation: Safety Precautions: Allergies reviewed. Appropriate site, procedure, and patient were confirmed by following the Joint Commission's Universal Protocol (UP.01.01.01), in the form of a "Time Out". The patient was asked to confirm marked site and procedure, before commencing. The patient was asked about blood thinners, or active infections, both of which were denied. Patient was assessed for positional comfort and all pressure points were checked before starting procedure. Allergies: He is allergic to varenicline.. Infection Control Precautions: Sterile  technique used. Standard Universal Precautions were taken as recommended by the Department of [facility] for Disease Control and Prevention (CDC). Standard pre-surgical skin prep was conducted. Respiratory hygiene and cough etiquette was practiced. Hand hygiene observed. Safe injection practices and needle disposal techniques followed. SDV (single dose vial) medications used. Medications properly checked for expiration dates and contaminants. Personal protective equipment (PPE) used: Surgical mask. Sterile Radiation-resistant gloves. Monitoring:  As per clinic protocol. Filed Vitals:   04/02/16 1324 04/02/16 1516 04/02/16 1521  BP: 160/59 182/93 168/75  Pulse: 60 58 52  Temp: 98.4 F (36.9 C)    TempSrc: Oral    Resp: _0 Height: 5' 9" (1.753 m)    Weight: 159 lb (72.122 kg)    SpO2: 98% 97% 97%  Calculated BMI: Body mass index is 23.47 kg/(m^2).  Description of Procedure Process:  Time-out: "Time-out" completed before starting procedure, as per protocol. Position: Prone Target Area: For Epidural Steroid injection(s), the target area is the  interlaminar space, initially targeting the lower border of the superior vertebral body lamina. Approach: Interlaminar approach. Area Prepped: Entire Posterior Thoracolumbar Region Prepping solution: Duraprep (Iodine Povacrylex [0.7% available Iodine] and Isopropyl Alcohol, 74% w/w) Safety Precautions: Aspiration looking for blood return was conducted prior to all injections. At no point did we inject any substances, as a needle was being advanced. No attempts were made at seeking any paresthesias. Safe injection practices and needle disposal techniques used. Medications properly checked for expiration dates. SDV (single dose vial) medications used.   Description of the Procedure: Protocol guidelines were followed. The patient was placed in position over the fluoroscopy table. The target area was identified and the area prepped in the  usual manner. Skin & deeper tissues infiltrated with local anesthetic. Appropriate amount of time allowed to pass for local anesthetics to take effect. The procedure needles were then advanced to the target area. The inferior aspect of the superior lamina was contacted and the needle walked caudad, until the lamina was cleared. The epidural space was identified using "loss-of-resistance technique" with 0.9% PF-NSS (2-56m), in a low friction 10cc LOR glass syringe. Proper needle placement was secured. Negative aspiration  confirmed. Solution injected in intermittent fashion, asking for systemic symptoms every 0.5 cc of injectate. The needles were then removed and the area cleansed, making sure to leave some of the prepping solution behind to take advantage of its long term bactericidal properties. EBL: None Materials & Medications Used:  Needle(s) Used: 20g - 10cm, Tuohy-style epidural needle Medication(s): Please see chart orders for medication and dosing details.  Imaging Guidance:   Type of Imaging Technique: Fluoroscopy Guidance (Spinal) Indication(s): Assistance in needle guidance and placement for procedures requiring needle placement in or near specific anatomical locations not easily accessible without such assistance. Exposure Time: Please see nurses notes. Contrast: Before injecting any contrast, we confirmed that the patient did not have an allergy to iodine, shellfish, or radiological contrast. Once satisfactory needle placement was completed at the desired level, radiological contrast was injected. Injection was conducted under continuous fluoroscopic guidance. Injection of contrast accomplished without complications. See chart for type and volume of contrast used. Fluoroscopic Guidance: I was personally present in the fluoroscopy suite, where the patient was placed in position for the procedure, over the fluoroscopy-compatible table. Fluoroscopy was manipulated, using "Tunnel Vision  Technique", to obtain the best possible view of the target area, on the affected side. Parallax error was corrected before commencing the procedure. A "direction-depth-direction" technique was used to introduce the needle under continuous pulsed fluoroscopic guidance. Once the target was reached, antero-posterior, oblique, and lateral fluoroscopic projection views were taken to confirm needle placement in all planes. Permanently recorded images stored by scanning into EMR. Interpretation: Intraoperative imaging interpretation by performing Physician. Adequate needle placement confirmed in AP & Oblique Views. Appropriate spread of contrast to desired area. No evidence of afferent or efferent intravascular uptake. No intrathecal or subarachnoid spread observed. Permanent images scanned into the patient's record.  Antibiotic Prophylaxis:  Indication(s): No indications identified. Type:  Antibiotics Given (last 72 hours)    None       Post-operative Assessment:   Complications: No immediate post-treatment complications were observed. Disposition: Return to clinic for follow-up evaluation. The patient tolerated the entire procedure well. A repeat set of vitals were taken after the procedure and the patient was kept under observation following institutional policy, for this type of procedure. Post-procedural neurological assessment was performed, showing return to baseline, prior to discharge. The patient was discharged home, once institutional criteria were met. The patient was provided with post-procedure discharge instructions, including a section on how to identify potential problems. Should any problems arise concerning this procedure, the patient was given instructions to immediately contact us, at any time, without hesitation. In any case, we plan to contact the patient by telephone for a follow-up status report regarding this interventional procedure. Comments:  No additional relevant  information.  Medications administered during this visit: We administered dexamethasone, iopamidol, lidocaine (PF), sodium chloride flush, ropivacaine (PF) 2 mg/ml (0.2%), and sodium chloride.  Prescriptions ordered during this visit: New Prescriptions   No medications on file    Future Appointments Date Time Provider [place]  05/13/2016 11:20 AM Milinda Pointer, MD [facility] None  06/24/2016 1:40 PM Milinda Pointer, MD [facility] None    Primary Care Physician: Pcp Not In System Location: [facility] Note by: Kathlen Brunswick. Dossie Arbour, M.D, DABA, DABAPM, DABPM, DABIPP, FIPP  Disclaimer:  Medicine is not an exact science. The only guarantee in medicine is that nothing is guaranteed. It is important to note that the decision to proceed with this intervention was based on the information collected  from the patient. The Data and conclusions were drawn from the patient's questionnaire, the interview, and the physical examination. Because the information was provided in large part by the patient, it cannot be guaranteed that it has not been purposely or unconsciously manipulated. Every effort has been made to obtain as much relevant data as possible for this evaluation. It is important to note that the conclusions that lead to this procedure are derived in large part from the available data. Always take into account that the treatment will also be dependent on availability of resources and existing treatment guidelines, considered by other Pain Management Practitioners as being common knowledge and practice, at the time of the intervention. For Medico-Legal purposes, it is also important to point out that variation in procedural techniques and pharmacological choices are the acceptable norm. The indications, contraindications, technique, and results of the above procedure should only be interpreted and judged by a Board-Certified Interventional Pain Specialist with  extensive familiarity and expertise in the same exact procedure and technique. Attempts at providing opinions without similar or greater experience and expertise than that of the treating physician will be considered as inappropriate and unethical, and shall result in a formal complaint to the state medical board and applicable specialty societies.

## 2016-04-02 NOTE — Progress Notes (Signed)
Quick Note:  Results were reviewed and found to be: abnormal   Review would suggest interventional pain management techniques may be of benefit  Surgical consultation is recommended. Referral to ENT requested. ______

## 2016-04-02 NOTE — Patient Instructions (Signed)
Epidural Steroid Injection Patient Information  Description: The epidural space surrounds the nerves as they exit the spinal cord.  In some patients, the nerves can be compressed and inflamed by a bulging disc or a tight spinal canal (spinal stenosis).  By injecting steroids into the epidural space, we can bring irritated nerves into direct contact with a potentially helpful medication.  These steroids act directly on the irritated nerves and can reduce swelling and inflammation which often leads to decreased pain.  Epidural steroids may be injected anywhere along the spine and from the neck to the low back depending upon the location of your pain.   After numbing the skin with local anesthetic (like Novocaine), a small needle is passed into the epidural space slowly.  You may experience a sensation of pressure while this is being done.  The entire block usually last less than 10 minutes.  Conditions which may be treated by epidural steroids:   Low back and leg pain  Neck and arm pain  Spinal stenosis  Post-laminectomy syndrome  Herpes zoster (shingles) pain  Pain from compression fractures  Preparation for the injection:  1. Do not eat any solid food or dairy products within 8 hours of your appointment.  2. You may drink clear liquids up to 3 hours before appointment.  Clear liquids include water, black coffee, juice or soda.  No milk or cream please. 3. You may take your regular medication, including pain medications, with a sip of water before your appointment  Diabetics should hold regular insulin (if taken separately) and take 1/2 normal NPH dos the morning of the procedure.  Carry some sugar containing items with you to your appointment. 4. A driver must accompany you and be prepared to drive you home after your procedure.  5. Bring all your current medications with your. 6. An IV may be inserted and sedation may be given at the discretion of the physician.   7. A blood pressure  cuff, EKG and other monitors will often be applied during the procedure.  Some patients may need to have extra oxygen administered for a short period. 8. You will be asked to provide medical information, including your allergies, prior to the procedure.  We must know immediately if you are taking blood thinners (like Coumadin/Warfarin)  Or if you are allergic to IV iodine contrast (dye). We must know if you could possible be pregnant.  Possible side-effects:  Bleeding from needle site  Infection (rare, may require surgery)  Nerve injury (rare)  Numbness & tingling (temporary)  Difficulty urinating (rare, temporary)  Spinal headache ( a headache worse with upright posture)  Light -headedness (temporary)  Pain at injection site (several days)  Decreased blood pressure (temporary)  Weakness in arm/leg (temporary)  Pressure sensation in back/neck (temporary)  Call if you experience:  Fever/chills associated with headache or increased back/neck pain.  Headache worsened by an upright position.  New onset weakness or numbness of an extremity below the injection site  Hives or difficulty breathing (go to the emergency room)  Inflammation or drainage at the infection site  Severe back/neck pain  Any new symptoms which are concerning to you  Please note:  Although the local anesthetic injected can often make your back or neck feel good for several hours after the injection, the pain will likely return.  It takes 3-7 days for steroids to work in the epidural space.  You may not notice any pain relief for at least that one week.  If effective, we will often do a series of three injections spaced 3-6 weeks apart to maximally decrease your pain.  After the initial series, we generally will wait several months before considering a repeat injection of the same type.  If you have any questions, please call [phone] [place]: Please fill out the post procedure pain diary and bring it with you at your next appointment.  Be careful moving about. Muscle spasms in the area of the injection may occur.  Use ice for the next 24 hours (15 minutes on, 15 minutes off).  After 24 hours, you may use heat for comfort if you wish.  Post procedure numbness or redness is expected, average 4-6 hours.  If numbness develops after 4-6 hours and is felt to be progressing and worsening, immediately contact your physician.

## 2016-04-03 NOTE — Telephone Encounter (Signed)
No answer-unable to leave message

## 2016-04-04 NOTE — Telephone Encounter (Signed)
Note: It is the responsibility of all patients to have an up to date list of all the medications that need to be refilled at the time of their appointment.  Pain Management Policy: Medication changes and refills will be conducted during appointments only, after determining if the medical indications for the use of the medication are still present. No fax, phone, or electronic refills will be conducted outside of an evaluation appointment. Patients are encouraged to call and set up an appointment to comply with stipulated policy.

## 2016-04-08 ENCOUNTER — Other Ambulatory Visit: Payer: Self-pay | Admitting: Pain Medicine

## 2016-04-09 LAB — TOXASSURE SELECT 13 (MW), URINE: PDF: 0

## 2016-04-11 ENCOUNTER — Telehealth: Payer: Self-pay

## 2016-04-11 NOTE — Telephone Encounter (Signed)
Ibuprofen 800 refill request from pharmacy

## 2016-05-13 ENCOUNTER — Encounter: Payer: Self-pay | Admitting: Pain Medicine

## 2016-05-13 ENCOUNTER — Ambulatory Visit: Payer: Medicare Other | Attending: Pain Medicine | Admitting: Pain Medicine

## 2016-05-13 VITALS — BP 156/65 | HR 69 | Temp 97.8°F | Resp 16 | Ht 69.0 in | Wt 154.0 lb

## 2016-05-13 DIAGNOSIS — M5412 Radiculopathy, cervical region: Secondary | ICD-10-CM | POA: Diagnosis not present

## 2016-05-13 DIAGNOSIS — K5730 Diverticulosis of large intestine without perforation or abscess without bleeding: Secondary | ICD-10-CM | POA: Diagnosis not present

## 2016-05-13 DIAGNOSIS — M5126 Other intervertebral disc displacement, lumbar region: Secondary | ICD-10-CM | POA: Diagnosis not present

## 2016-05-13 DIAGNOSIS — M797 Fibromyalgia: Secondary | ICD-10-CM | POA: Diagnosis not present

## 2016-05-13 DIAGNOSIS — J449 Chronic obstructive pulmonary disease, unspecified: Secondary | ICD-10-CM | POA: Diagnosis not present

## 2016-05-13 DIAGNOSIS — F17210 Nicotine dependence, cigarettes, uncomplicated: Secondary | ICD-10-CM | POA: Insufficient documentation

## 2016-05-13 DIAGNOSIS — M4806 Spinal stenosis, lumbar region: Secondary | ICD-10-CM

## 2016-05-13 DIAGNOSIS — Z79891 Long term (current) use of opiate analgesic: Secondary | ICD-10-CM | POA: Insufficient documentation

## 2016-05-13 DIAGNOSIS — K5903 Drug induced constipation: Secondary | ICD-10-CM | POA: Insufficient documentation

## 2016-05-13 DIAGNOSIS — M50222 Other cervical disc displacement at C5-C6 level: Secondary | ICD-10-CM | POA: Insufficient documentation

## 2016-05-13 DIAGNOSIS — I70209 Unspecified atherosclerosis of native arteries of extremities, unspecified extremity: Secondary | ICD-10-CM | POA: Diagnosis not present

## 2016-05-13 DIAGNOSIS — G8929 Other chronic pain: Secondary | ICD-10-CM

## 2016-05-13 DIAGNOSIS — E785 Hyperlipidemia, unspecified: Secondary | ICD-10-CM | POA: Diagnosis not present

## 2016-05-13 DIAGNOSIS — M2578 Osteophyte, vertebrae: Secondary | ICD-10-CM | POA: Diagnosis not present

## 2016-05-13 DIAGNOSIS — I4891 Unspecified atrial fibrillation: Secondary | ICD-10-CM | POA: Diagnosis not present

## 2016-05-13 DIAGNOSIS — M4312 Spondylolisthesis, cervical region: Secondary | ICD-10-CM | POA: Insufficient documentation

## 2016-05-13 DIAGNOSIS — I10 Essential (primary) hypertension: Secondary | ICD-10-CM | POA: Insufficient documentation

## 2016-05-13 DIAGNOSIS — M791 Myalgia: Secondary | ICD-10-CM | POA: Insufficient documentation

## 2016-05-13 DIAGNOSIS — M542 Cervicalgia: Secondary | ICD-10-CM | POA: Diagnosis present

## 2016-05-13 DIAGNOSIS — M47816 Spondylosis without myelopathy or radiculopathy, lumbar region: Secondary | ICD-10-CM

## 2016-05-13 DIAGNOSIS — K219 Gastro-esophageal reflux disease without esophagitis: Secondary | ICD-10-CM | POA: Diagnosis not present

## 2016-05-13 DIAGNOSIS — Z7984 Long term (current) use of oral hypoglycemic drugs: Secondary | ICD-10-CM | POA: Insufficient documentation

## 2016-05-13 DIAGNOSIS — M48061 Spinal stenosis, lumbar region without neurogenic claudication: Secondary | ICD-10-CM

## 2016-05-13 DIAGNOSIS — F3189 Other bipolar disorder: Secondary | ICD-10-CM | POA: Diagnosis not present

## 2016-05-13 DIAGNOSIS — F1021 Alcohol dependence, in remission: Secondary | ICD-10-CM | POA: Insufficient documentation

## 2016-05-13 DIAGNOSIS — M546 Pain in thoracic spine: Secondary | ICD-10-CM | POA: Insufficient documentation

## 2016-05-13 DIAGNOSIS — M545 Low back pain: Secondary | ICD-10-CM | POA: Diagnosis not present

## 2016-05-13 DIAGNOSIS — M5010 Cervical disc disorder with radiculopathy, unspecified cervical region: Secondary | ICD-10-CM | POA: Insufficient documentation

## 2016-05-13 DIAGNOSIS — D490 Neoplasm of unspecified behavior of digestive system: Secondary | ICD-10-CM | POA: Diagnosis not present

## 2016-05-13 DIAGNOSIS — I739 Peripheral vascular disease, unspecified: Secondary | ICD-10-CM | POA: Diagnosis not present

## 2016-05-13 DIAGNOSIS — M4807 Spinal stenosis, lumbosacral region: Secondary | ICD-10-CM | POA: Insufficient documentation

## 2016-05-13 NOTE — Progress Notes (Signed)
Safety precautions to be maintained throughout the outpatient stay will include: orient to surroundings, keep bed in low position, maintain call bell within reach at all times, provide assistance with transfer out of bed and ambulation.  Patient here today post procedure eval

## 2016-05-13 NOTE — Progress Notes (Signed)
Patient's Name: Rick PLOG Sr.  Patient type: Established  MRN: [id number]  Service setting: Ambulatory outpatient  DOB: 16-Apr-1951  Location: [facility]  DOS: 05/13/2016  Primary Care Physician: Pcp Not In System  Note by: Danyka Merlin A. Dossie Arbour, M.D, DABA, DABAPM, DABPM, DABIPP, FIPP  Referring Physician: No ref. provider found  Specialty: Board-Certified Interventional Pain Management  Last Visit to Pain Management: 04/11/2016   Primary Reason(s) for Visit: Encounter for post-procedure evaluation of chronic illness with mild to moderate exacerbation CC: Neck Pain (lower)   HPI  Rick Carter is a 65 y.o. year old, male patient, who returns today as an established patient. 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[place]); Atrial fibrillation ([place]); Bipolar affective disorder ([place]); Chronic obstructive pulmonary disease ([place]); Degeneration of intervertebral disc of cervical region; Colon, diverticulosis; Acid reflux; Essential (primary) hypertension; HLD (hyperlipidemia); Breath shortness; Compulsive tobacco user syndrome; Diverticular disease of large intestine; Post-thoracotomy pain syndrome; Encounter for therapeutic drug level monitoring; Long term current use of opiate analgesic; Long term prescription opiate use; Uncomplicated opioid dependence ([place]); Opiate use; Fibromyalgia; Chronic pain syndrome; Chronic pain; Lumbar facet syndrome (Location of Primary Source of Pain) (Bilateral) (R>L); History of alcoholism ([place]); Chronic low back pain (Location of Primary Source of Pain) (midline); Lumbar spondylosis; Cervical spondylosis; Failed cervical surgery syndrome; Nicotine dependence; Tobacco abuse; Substance Use Disorder Risk: High; History of right-sided partial pneumonectomy; Atherosclerotic peripheral vascular disease ([place]); Musculoskeletal pain; Myofascial pain; Neurogenic pain; Neuropathic pain; Opioid-induced constipation ([place]); Osteoarthritis; NSAID induced  gastritis; Trigger finger of right hand (middle finger); Thoracic spine pain; Thoracic radicular pain (Right); Chronic lumbar radicular pain (L5/S1 dermatome) (Location of Secondary source of pain) (Right); Lumbar foraminal stenosis (L5-S1) (Right); Lumbar facet arthropathy (Bilateral) (R>L); Lumbar disc protrusion (L3-4) (Right); Chronic cervical radicular pain (Left); and 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 His primarily concern today is the Neck Pain (lower)   Pain Assessment: Self-Reported Pain Score: 3              Reported level is compatible with observation       Pain Type: Chronic pain Pain Location: Neck Pain Orientation: Mid Pain Descriptors / Indicators: Aching, Constant Pain Frequency: Constant  The patient comes into the clinics today for post-procedure evaluation on the interventional treatment done on 04/08/2016. Patient did well after the palliative injections and he indicates that he is scheduled to have a herniorrhaphy in the right inguinal area. Today we have provided him with the handout for the surgeon on how to manage the postop pain.  Date of Last Visit: 04/02/16    Post-Procedure Assessment  Procedure done on last visit: Palliative right-sided T8-9 thoracic epidural steroid injection under fluoroscopic guidance, no sedation. Side-effects or Adverse reactions: None reported Sedation: Please see nurses note  Results: Ultra-Short Term Relief (First 1 hour after procedure): 90 %  Analgesia during this period is likely to be Local Anesthetic and/or IV Sedative (Analgesic/Anxiolitic) related Short Term Relief (Initial 4-6 hrs after procedure): 90 % Complete relief would confirms area to be the source of pain Long Term Relief : 50 % Long-term benefit would suggest an inflammatory etiology to the pain   Current Relief (Now): 50%   Persistent relief would suggest effective anti-inflammatory effects from steroids Interpretation of Results: The results of this  injection support its palliative use on a prn basis.  Laboratory Chemistry  Inflammation Markers Lab Results  Component Value Date   ESRSEDRATE 1 10/10/2015   CRP 0.8  10/10/2015    Renal Function Lab Results  Component Value Date   BUN 13 10/10/2015   CREATININE 0.82 10/10/2015   GFRAA >60 10/10/2015   GFRNONAA >60 10/10/2015    Hepatic Function Lab Results  Component Value Date   AST 21 10/10/2015   ALT 16 (L) 10/10/2015   ALBUMIN 4.5 10/10/2015    Electrolytes Lab Results  Component Value Date   NA 136 10/10/2015   K 3.5 10/10/2015   CL 98 (L) 10/10/2015   CALCIUM 9.8 10/10/2015   MG 2.3 10/10/2015    Pain Modulating Vitamins Lab Results  Component Value Date   25OHVITD1 51 10/10/2015   25OHVITD2 <1.0 10/10/2015   25OHVITD3 50 10/10/2015    Coagulation Parameters No results found for: INR, LABPROT, APTT, PLT  Cardiovascular No results found for: BNP, HGB, HCT  Note: Lab results reviewed.  Recent Diagnostic Imaging  Rick Carter  Result Date: 03/27/2016 CLINICAL DATA:  Neck pain and left shoulder arm pain and left-sided chest pain. Anterior cervical fusion at C4-5. EXAM: MRI CERVICAL SPINE WITHOUT Carter TECHNIQUE: Multiplanar, multisequence Rick imaging of the cervical spine was performed. No intravenous Carter was administered. COMPARISON:  MRI dated 03/01/2014 FINDINGS: Alignment: 2.5 mm retrolisthesis of C3 on C4.  Otherwise normal. Vertebrae: Solid anterior fusion at C4-5. No significant facet arthritis in the cervical spine. No other significant abnormality. Cord: No mass lesion or myelopathy. 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 Disc levels: Craniocervical junction through C2-3:  Normal. C3-4: Retrolisthesis with a broad-based disc osteophyte complex with  osteophytes which slightly narrow the right neural foramen. AP dimension of the spinal canal is narrowed to 7 mm and the spinal cord is slightly compressed without myelopathy, unchanged since the prior study. C4-5:  Solid anterior fusion with no residual impingement. C5-6: Small central disc bulge without neural impingement, unchanged. No foraminal stenosis. C6-7: Chronic disc space narrowing. Minimal endplate osteophytes extend into the left neural foramen without significant foraminal stenosis. Asymmetric osteophytes slightly encroach upon the left side of the thecal sac but this is less apparent than on the prior study. This might irritate the left C7 nerve. There is no residual soft disc component. C7-T1:  Normal. IMPRESSION: 1. Slight cervical spinal stenosis at C3-4 without myelopathy. No change since the prior study. 2. Osteophytes slightly encroach upon the left side of the thecal sac at C6-7, less prominent on the prior study. No residual soft tissue component as was apparent on the prior study. 3. 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 Electronically Signed   By: Lorriane Shire M.D.   On: 03/27/2016 13:02    Meds  The patient has a current medication list which includes the following prescription(s): aclidinium bromide, advair diskus, albuterol sulfate, aspirin ec, calcium carb-cholecalciferol, cholecalciferol, docusate sodium, fluticasone-salmeterol, hydrochlorothiazide, hydroxyzine, ibuprofen, loratadine, metformin, multiple vitamins-minerals, oxycodone-acetaminophen, oxycodone-acetaminophen, oxycodone-acetaminophen, pantoprazole, pregabalin, trazodone, tudorza pressair, and ventolin hfa.  Current Outpatient Prescriptions on File Prior to Visit  Medication Sig  . Aclidinium Bromide (TUDORZA PRESSAIR IN) Inhale 40 mcg into the lungs 2 (two) times daily.  Marland Kitchen ADVAIR DISKUS 250-50 MCG/DOSE AEPB INHALE 1 PUFF 2 (TWO) TIMES A  DAY.  Marland Kitchen Albuterol Sulfate (VENTOLIN HFA IN) Inhale 1 puff into the lungs.  Marland Kitchen aspirin  EC 81 MG tablet Take 81 mg by mouth daily.  . Calcium Carb-Cholecalciferol (SM CALCIUM/VITAMIN D) 600-800 MG-UNIT TABS   . cholecalciferol (VITAMIN D) 1000 UNITS tablet Take 2,000 Units by mouth daily.  Marland Kitchen docusate sodium (COLACE) 100 MG capsule Take 1 capsule (100 mg total) by mouth daily.  . Fluticasone-Salmeterol (ADVAIR) 500-50 MCG/DOSE AEPB Inhale 1 puff into the lungs 2 (two) times daily.  . hydrochlorothiazide (MICROZIDE) 12.5 MG capsule TAKE ONE CAPSULE BY MOUTH FOR HIGH BLOOD PRESSURE  . hydrOXYzine (ATARAX/VISTARIL) 25 MG tablet Take 25 mg by mouth as needed.  Marland Kitchen ibuprofen (ADVIL,MOTRIN) 800 MG tablet Take 1 tablet (800 mg total) by mouth every 8 (eight) hours as needed for moderate pain.  Marland Kitchen loratadine (CLARITIN) 10 MG tablet Take 10 mg by mouth.  . metFORMIN (GLUCOPHAGE) 500 MG tablet Take 500 mg by mouth 2 (two) times daily with a meal.  . Multiple Vitamins-Minerals (PRESERVISION AREDS 2 PO) Take 1 tablet by mouth 2 (two) times daily.  Marland Kitchen oxyCODONE-acetaminophen (PERCOCET/ROXICET) 5-325 MG tablet Take 1 tablet by mouth every 6 (six) hours as needed for severe pain.  Marland Kitchen oxyCODONE-acetaminophen (PERCOCET/ROXICET) 5-325 MG tablet Take 1 tablet by mouth every 6 (six) hours as needed for severe pain.  Marland Kitchen oxyCODONE-acetaminophen (PERCOCET/ROXICET) 5-325 MG tablet Take 1 tablet by mouth every 6 (six) hours as needed for severe pain.  . pantoprazole (PROTONIX) 40 MG tablet Take 40 mg by mouth.  . pregabalin (LYRICA) 150 MG capsule Take 1 capsule (150 mg total) by mouth 2 (two) times daily.  . traZODone (DESYREL) 50 MG tablet Take 50 mg by mouth at bedtime. Reported on 02/20/2016  . TUDORZA PRESSAIR 400 MCG/ACT AEPB   . VENTOLIN HFA 108 (90 Base) MCG/ACT inhaler INHALE 2 PUFFS EVERY FOUR (4) HOURS AS NEEDED FOR SHORTNESS OF BREATH.   No current facility-administered medications on file prior to visit.     ROS   Constitutional: Denies any fever or chills Gastrointestinal: No reported hemesis, hematochezia, vomiting, or acute GI distress Musculoskeletal: Denies any acute onset joint swelling, redness, loss of ROM, or weakness Neurological: No reported episodes of acute onset apraxia, aphasia, dysarthria, agnosia, amnesia, paralysis, loss of coordination, or loss of consciousness  Allergies  Rick Carter is allergic to varenicline.  [place]  Medical:  Rick Carter  has a past medical history of Anxiety; COPD (chronic obstructive pulmonary disease) ([place]); Depression; Hernia of abdominal wall; History of alcoholism ([place]) (08/08/2015); History of pneumonectomy (08/08/2015); Hypertension; and Tumor cells. Family: family history includes Alcohol abuse in his father; Kidney disease in his mother. Surgical:  has a past surgical history that includes Tonsillectomy; Lung removal, partial; and Neck surgery. Tobacco:  reports that he has been smoking Cigarettes.  He has a 51.00 pack-year smoking history. He has never used smokeless tobacco. Alcohol:  reports that he does not drink alcohol. Drug:  has no drug history on file.  Constitutional Exam  Vitals: Blood pressure (!) 156/65, pulse 69, temperature 97.8 F (36.6 C), temperature source Oral, resp. rate 16, height _0  (1.753 m), weight 154 lb (69.9 kg), SpO2 98 %. General appearance: Well nourished, well developed, and well hydrated. In no acute distress Calculated BMI/Body habitus: Body mass index is 22.74 kg/m.       Psych/Mental status: Alert and oriented x 3 (person, place, & time) Eyes: PERLA Respiratory: No evidence of acute respiratory distress  Cervical Spine Exam  Inspection: No masses, redness, or swelling Alignment: Symmetrical ROM: Functional: ROM is within functional limits (  WFL) Stability: No instability detected Muscle strength & Tone: Functionally intact Sensory: Unimpaired Palpation: No complaints of tenderness  Upper Extremity (UE)  Exam    Side: Right upper extremity  Side: Left upper extremity  Inspection: No masses, redness, swelling, or asymmetry  Inspection: No masses, redness, swelling, or asymmetry  ROM:  ROM:  Functional: ROM is within functional limits [facility])        Functional: ROM is within functional limits [facility])        Muscle strength & Tone: Functionally intact  Muscle strength & Tone: Functionally intact  Sensory: Unimpaired  Sensory: Unimpaired  Palpation: No complaints of tenderness  Palpation: No complaints of tenderness   Thoracic Spine Exam  Inspection: No masses, redness, or swelling Alignment: Symmetrical ROM: Functional: ROM is within functional limits [facility]) Stability: No instability detected Sensory: Unimpaired Muscle strength & Tone: Functionally intact Palpation: No complaints of tenderness  Lumbar Spine Exam  Inspection: No masses, redness, or swelling Alignment: Symmetrical ROM: Functional: ROM is within functional limits [facility]) Stability: No instability detected Muscle strength & Tone: Functionally intact Sensory: Unimpaired Palpation: No complaints of tenderness Provocative Tests: Lumbar Hyperextension and rotation test: provocative test deferred today       Patrick's Maneuver: provocative test deferred today              Gait & Posture Assessment  Ambulation: Unassisted Gait: Unaffected Posture: WNL   Lower Extremity Exam    Side: Right lower extremity  Side: Left lower extremity  Inspection: No masses, redness, swelling, or asymmetry ROM:  Inspection: No masses, redness, swelling, or asymmetry ROM:  Functional: ROM is within functional limits [facility])        Functional: ROM is within functional limits [facility])        Muscle strength & Tone: Functionally intact  Muscle strength & Tone: Functionally intact  Sensory: Unimpaired  Sensory: Unimpaired  Palpation: No complaints of tenderness  Palpation: No complaints of tenderness}   Assessment & Plan  Primary Diagnosis & Pertinent  Problem List: The primary encounter diagnosis was Chronic pain. Diagnoses of Chronic cervical radicular pain (Left), Lumbar facet syndrome (Location of Primary Source of Pain) (Bilateral) (R>L), and Lumbar foraminal stenosis (L5-S1) (Right) were also pertinent to this visit.  Visit Diagnosis: 1. Chronic pain   2. Chronic cervical radicular pain (Left)   3. Lumbar facet syndrome (Location of Primary Source of Pain) (Bilateral) (R>L)   4. Lumbar foraminal stenosis (L5-S1) (Right)     Problem-specific Plan(s): No problem-specific Assessment & Plan notes found for this encounter.   Plan of Care   Problem List Items Addressed This Visit      High   Chronic pain - Primary (Chronic)   Lumbar facet syndrome (Location of Primary Source of Pain) (Bilateral) (R>L) (Chronic)   Lumbar foraminal stenosis (L5-S1) (Right) (Chronic)   Chronic cervical radicular pain (Left) (Chronic)    Other Visit Diagnoses   None.      Pharmacotherapy (Medications Ordered): No orders of the defined types were placed in this encounter.   Lab-work & Procedure Ordered: No orders of the defined types were placed in this encounter.   Imaging Ordered: None  Interventional Therapies: Scheduled:   none at this time    Considering:   1. Palliative bilateral lumbar facet block under fluoroscopic guidance and IV sedation.  2. Palliative left-sided cervical epidural steroid injection under fluoroscopic guidance, with or without sedation.  3. Palliative right-sided L5-S1 lumbar epidural steroid injection under fluoroscopic guidance, with or  without sedation.  4. Diagnostic bilateral cervical facet block under fluoroscopic guidance and IV sedation.  5. Palliative Right-sided T8-9 thoracic epidural steroid injection under fluoroscopic guidance, without IV sedation.    PRN Procedures:   1. Palliative bilateral lumbar facet block under fluoroscopic guidance and IV sedation.  2. Palliative left-sided cervical  epidural steroid injection under fluoroscopic guidance, with or without sedation.  3. Palliative right-sided L5-S1 lumbar epidural steroid injection under fluoroscopic guidance, with or without sedation.    Referral(s) or Consult(s): None at this time.  Medications administered during this visit: Rick Carter had no medications administered during this visit.  Requested PM Follow-up: Return for Keep prior appointment, (PRN) Procedure.  Future Appointments Date Time Provider [place]  06/24/2016 1:40 PM Milinda Pointer, MD [facility] None    Primary Care Physician: Pcp Not In System Location: [facility] Note by: Kathlen Brunswick. Dossie Arbour, M.D, DABA, DABAPM, DABPM, DABIPP, FIPP  Pain Score Disclaimer: We use the NRS-11 scale. This is a self-reported, subjective measurement of pain severity with only modest accuracy. It is used primarily to identify changes within a particular patient. It must be understood that outpatient pain scales are significantly less accurate that those used for research, where they can be applied under ideal controlled circumstances with minimal exposure to variables. In reality, the score is likely to be a combination of pain intensity and pain affect, where pain affect describes the degree of emotional arousal or changes in action readiness caused by the sensory experience of pain. Factors such as social and work situation, setting, emotional state, anxiety levels, expectation, and prior pain experience may influence pain perception and show large inter-individual differences that may also be affected by time variables.  Patient instructions provided during this appointment: There are no Patient Instructions on file for this visit.

## 2016-06-08 ENCOUNTER — Other Ambulatory Visit: Payer: Self-pay | Admitting: Pain Medicine

## 2016-06-08 DIAGNOSIS — T39395A Adverse effect of other nonsteroidal anti-inflammatory drugs [NSAID], initial encounter: Principal | ICD-10-CM

## 2016-06-08 DIAGNOSIS — K2960 Other gastritis without bleeding: Secondary | ICD-10-CM

## 2016-06-24 ENCOUNTER — Ambulatory Visit: Payer: Medicare Other | Attending: Pain Medicine | Admitting: Pain Medicine

## 2016-06-24 ENCOUNTER — Encounter: Payer: Self-pay | Admitting: Pain Medicine

## 2016-06-24 VITALS — BP 159/70 | HR 59 | Temp 98.1°F | Resp 18 | Ht 69.0 in | Wt 153.0 lb

## 2016-06-24 DIAGNOSIS — F1190 Opioid use, unspecified, uncomplicated: Secondary | ICD-10-CM | POA: Diagnosis not present

## 2016-06-24 DIAGNOSIS — F1021 Alcohol dependence, in remission: Secondary | ICD-10-CM | POA: Insufficient documentation

## 2016-06-24 DIAGNOSIS — M4312 Spondylolisthesis, cervical region: Secondary | ICD-10-CM | POA: Insufficient documentation

## 2016-06-24 DIAGNOSIS — I739 Peripheral vascular disease, unspecified: Secondary | ICD-10-CM | POA: Insufficient documentation

## 2016-06-24 DIAGNOSIS — G8929 Other chronic pain: Secondary | ICD-10-CM | POA: Diagnosis not present

## 2016-06-24 DIAGNOSIS — F419 Anxiety disorder, unspecified: Secondary | ICD-10-CM | POA: Insufficient documentation

## 2016-06-24 DIAGNOSIS — J449 Chronic obstructive pulmonary disease, unspecified: Secondary | ICD-10-CM | POA: Insufficient documentation

## 2016-06-24 DIAGNOSIS — F3189 Other bipolar disorder: Secondary | ICD-10-CM | POA: Diagnosis not present

## 2016-06-24 DIAGNOSIS — M5414 Radiculopathy, thoracic region: Secondary | ICD-10-CM | POA: Diagnosis not present

## 2016-06-24 DIAGNOSIS — K219 Gastro-esophageal reflux disease without esophagitis: Secondary | ICD-10-CM | POA: Insufficient documentation

## 2016-06-24 DIAGNOSIS — E785 Hyperlipidemia, unspecified: Secondary | ICD-10-CM | POA: Insufficient documentation

## 2016-06-24 DIAGNOSIS — M65331 Trigger finger, right middle finger: Secondary | ICD-10-CM | POA: Insufficient documentation

## 2016-06-24 DIAGNOSIS — M4806 Spinal stenosis, lumbar region: Secondary | ICD-10-CM | POA: Diagnosis not present

## 2016-06-24 DIAGNOSIS — K5903 Drug induced constipation: Secondary | ICD-10-CM | POA: Diagnosis not present

## 2016-06-24 DIAGNOSIS — M4726 Other spondylosis with radiculopathy, lumbar region: Secondary | ICD-10-CM | POA: Diagnosis not present

## 2016-06-24 DIAGNOSIS — M5412 Radiculopathy, cervical region: Secondary | ICD-10-CM

## 2016-06-24 DIAGNOSIS — Z79891 Long term (current) use of opiate analgesic: Secondary | ICD-10-CM | POA: Diagnosis not present

## 2016-06-24 DIAGNOSIS — M542 Cervicalgia: Secondary | ICD-10-CM | POA: Diagnosis present

## 2016-06-24 DIAGNOSIS — I4891 Unspecified atrial fibrillation: Secondary | ICD-10-CM | POA: Insufficient documentation

## 2016-06-24 DIAGNOSIS — F17210 Nicotine dependence, cigarettes, uncomplicated: Secondary | ICD-10-CM | POA: Insufficient documentation

## 2016-06-24 DIAGNOSIS — M797 Fibromyalgia: Secondary | ICD-10-CM

## 2016-06-24 DIAGNOSIS — M150 Primary generalized (osteo)arthritis: Secondary | ICD-10-CM

## 2016-06-24 DIAGNOSIS — M1991 Primary osteoarthritis, unspecified site: Secondary | ICD-10-CM | POA: Insufficient documentation

## 2016-06-24 DIAGNOSIS — Z902 Acquired absence of lung [part of]: Secondary | ICD-10-CM | POA: Insufficient documentation

## 2016-06-24 DIAGNOSIS — K5730 Diverticulosis of large intestine without perforation or abscess without bleeding: Secondary | ICD-10-CM | POA: Insufficient documentation

## 2016-06-24 DIAGNOSIS — I10 Essential (primary) hypertension: Secondary | ICD-10-CM | POA: Insufficient documentation

## 2016-06-24 DIAGNOSIS — F329 Major depressive disorder, single episode, unspecified: Secondary | ICD-10-CM | POA: Insufficient documentation

## 2016-06-24 DIAGNOSIS — K2960 Other gastritis without bleeding: Secondary | ICD-10-CM | POA: Diagnosis not present

## 2016-06-24 DIAGNOSIS — M4722 Other spondylosis with radiculopathy, cervical region: Secondary | ICD-10-CM | POA: Insufficient documentation

## 2016-06-24 DIAGNOSIS — R221 Localized swelling, mass and lump, neck: Secondary | ICD-10-CM | POA: Insufficient documentation

## 2016-06-24 DIAGNOSIS — Z7982 Long term (current) use of aspirin: Secondary | ICD-10-CM | POA: Insufficient documentation

## 2016-06-24 DIAGNOSIS — Z87891 Personal history of nicotine dependence: Secondary | ICD-10-CM | POA: Insufficient documentation

## 2016-06-24 DIAGNOSIS — I70209 Unspecified atherosclerosis of native arteries of extremities, unspecified extremity: Secondary | ICD-10-CM | POA: Insufficient documentation

## 2016-06-24 DIAGNOSIS — M159 Polyosteoarthritis, unspecified: Secondary | ICD-10-CM

## 2016-06-24 DIAGNOSIS — T402X5A Adverse effect of other opioids, initial encounter: Secondary | ICD-10-CM

## 2016-06-24 DIAGNOSIS — M2578 Osteophyte, vertebrae: Secondary | ICD-10-CM | POA: Insufficient documentation

## 2016-06-24 DIAGNOSIS — Z7984 Long term (current) use of oral hypoglycemic drugs: Secondary | ICD-10-CM | POA: Insufficient documentation

## 2016-06-24 DIAGNOSIS — M5030 Other cervical disc degeneration, unspecified cervical region: Secondary | ICD-10-CM | POA: Insufficient documentation

## 2016-06-24 DIAGNOSIS — M4802 Spinal stenosis, cervical region: Secondary | ICD-10-CM | POA: Insufficient documentation

## 2016-06-24 MED ORDER — PREGABALIN 150 MG PO CAPS: 150.0000 mg | ORAL_CAPSULE | Freq: Two times a day (BID) | ORAL | 0 refills | Status: DC

## 2016-06-24 MED ORDER — DOCUSATE SODIUM 100 MG PO CAPS: 100.0000 mg | ORAL_CAPSULE | Freq: Every day | ORAL | 0 refills | Status: DC

## 2016-06-24 MED ORDER — OXYCODONE-ACETAMINOPHEN 5-325 MG PO TABS: 1.0000 | ORAL_TABLET | Freq: Four times a day (QID) | ORAL | 0 refills | Status: DC | PRN

## 2016-06-24 MED ORDER — IBUPROFEN 800 MG PO TABS: 800.0000 mg | ORAL_TABLET | Freq: Three times a day (TID) | ORAL | 2 refills | Status: DC | PRN

## 2016-06-24 NOTE — Progress Notes (Signed)
Patient's Name: Rick HING Sr.  MRN: [id number]  Referring Provider: No ref. provider found  DOB: 03-31-1951  PCP: Pcp Not In System  DOS: 06/24/2016  Note by: Laquon Emel A. Dossie Arbour, MD  Service setting: Ambulatory outpatient  Specialty: Interventional Pain Management  Location: [facility] (AMB) Pain Management Facility    Patient type: Established   Primary Reason(s) for Visit: Encounter for prescription drug management (Level of risk: moderate) CC: Neck Pain (left)   HPI  Rick Carter is a 65 y.o. year old, male patient, who returns today as an established patient. 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[place]); Atrial fibrillation ([place]); Bipolar affective disorder ([place]); Chronic obstructive pulmonary disease ([place]); Degeneration of intervertebral disc of Carter region; Colon, diverticulosis; Acid reflux; Essential (primary) hypertension; HLD (hyperlipidemia); Breath shortness; Compulsive tobacco user syndrome; Diverticular disease of large intestine; Post-thoracotomy pain syndrome; Encounter for therapeutic drug level monitoring; Long term current use of opiate analgesic; Long term prescription opiate use; Uncomplicated opioid dependence ([place]); Opiate use; Fibromyalgia; Chronic pain syndrome; Chronic pain; Lumbar facet syndrome (Location of Primary Source of Pain) (Bilateral) (R>L); History of alcoholism ([place]); Chronic low back pain (Location of Primary Source of Pain) (midline); Lumbar spondylosis; Carter spondylosis; Failed Carter surgery syndrome; Nicotine dependence; Tobacco abuse; Substance Use Disorder Risk: High; History of right-sided partial pneumonectomy; Atherosclerotic peripheral vascular disease ([place]); Musculoskeletal pain; Myofascial pain; Neurogenic pain; Neuropathic pain; Opioid-induced constipation (OIC); Osteoarthritis; NSAID induced gastritis; Trigger finger of right hand (middle finger); Thoracic spine pain; Thoracic radicular pain (Right); Chronic lumbar radicular pain  (L5/S1 dermatome) (Location of Secondary source of pain) (Right); Lumbar foraminal stenosis (L5-S1) (Right); Lumbar facet arthropathy (Bilateral) (R>L); Lumbar disc protrusion (L3-4) (Right); Chronic Carter radicular pain (Left); and 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 His primarily concern today is the Neck Pain (left)  Pain Assessment: Self-Reported Pain Score: 4  Clinically the patient looks like a 3/10 Reported level is inconsistent with clinical observations. Information on the proper use of the pain score provided to the patient today. Pain Type: Chronic pain Pain Location: Neck Pain Orientation: Left Pain Descriptors / Indicators: Aching, Constant Pain Frequency: Constant  The patient comes into the clinics today for pharmacological management of his chronic pain. I last saw this patient on 06/08/2016. The patient  has no drug history on file. His body mass index is 22.59 kg/m.  Date of Last Visit: 05/13/16 Service Provided on Last Visit: Evaluation  Controlled Substance Pharmacotherapy Assessment & REMS (Risk Evaluation and Mitigation Strategy)  Analgesic: Oxycodone/APAP 5/325 one every 6 hours (20 mg/day) MME/day: 30 mg/day Pill Count: Bottle labeled Oxycodone/acet  #56/120   Filled 06/08/2016. Pharmacokinetics: Onset of action (Liberation/Absorption): Within expected pharmacological parameters Time to Peak effect (Distribution): Timing and results are as within normal expected parameters Duration of action (Metabolism/Excretion): Within normal limits for medication Pharmacodynamics: Analgesic Effect: More than 50% Activity Facilitation: Medication(s) allow patient to sit, stand, walk, and do the basic ADLs Perceived Effectiveness: Described as relatively effective, allowing for increase in activities of daily living (ADL) Side-effects or Adverse reactions: None reported Monitoring: [place] PMP: Online review of the past 32-monthperiod conducted. Compliant with practice  rules and regulations Last UDS on record: Tox[employer]ure Select 13  Date Value Ref Range Status  04/01/2016 FINAL  Final    Comment:    ==================================================================== TOXASSURE SELECT 13 (MW) ==================================================================== Test  Result       Flag       Units Drug Present and Declared for Prescription Verification   Oxycodone                      717          EXPECTED   ng/mg creat   Oxymorphone                    2578         EXPECTED   ng/mg creat   Noroxycodone                   2400         EXPECTED   ng/mg creat   Noroxymorphone                 737          EXPECTED   ng/mg creat    Sources of oxycodone are scheduled prescription medications.    Oxymorphone, noroxycodone, and noroxymorphone are expected    metabolites of oxycodone. Oxymorphone is also available as a    scheduled prescription medication. ==================================================================== Test                      Result    Flag   Units      Ref Range   Creatinine              65               mg/dL      >=20 ==================================================================== Declared Medications:  The flagging and interpretation on this report are based on the  following declared medications.  Unexpected results may arise from  inaccuracies in the declared medications.  **Note: The testing scope of this panel includes these medications:  Oxycodone (Percocet)  **Note: The testing scope of this panel does not include following  reported medications:  Acetaminophen (Percocet)  Aclidinium  Aclidinium (Tudorza)  Albuterol  Albuterol (Ventolin)  Aspirin (Aspirin 81)  Calcium Carbonate (Calcium Carb/Vitamin D)  Docusate (Colace)  Fluticasone (Advair)  Hydrochlorothiazide (Microzide)  Hydroxyzine (Atarax)  Ibuprofen (Advil)  Loratadine (Claritin)  Metformin (Glucophage)  Pantoprazole  (Protonix)  Pregabalin (Lyrica)  Salmeterol (Advair)  Trazodone (Desyrel)  Vitamin D  Vitamin D (Calcium Carb/Vitamin D) ==================================================================== For clinical consultation, please call [phone]. ====================================================================    UDS interpretation: Compliant          Medication Assessment Form: Reviewed. Patient indicates being compliant with therapy Treatment compliance: Compliant Risk Assessment: Aberrant Behavior: None observed today Substance Use Disorder (SUD) Risk Level: Moderate Risk of opioid abuse or dependence: 0.7-3.0% with doses ? 36 MME/day and 6.1-26% with doses ? 120 MME/day. Opioid Risk Tool (ORT) Score: Total Score: 6 Moderate Risk for SUD (Score between 4-7) Depression Scale Score: PHQ-2: PHQ-2 Total Score: 0 No depression (0) PHQ-9: PHQ-9 Total Score: 0 No depression (0-4)  Pharmacologic Plan: No change in therapy, at this time  Laboratory Chemistry  Inflammation Markers Lab Results  Component Value Date   ESRSEDRATE 1 10/10/2015   CRP 0.8 10/10/2015    Renal Function Lab Results  Component Value Date   BUN 13 10/10/2015   CREATININE 0.82 10/10/2015   GFRAA >60 10/10/2015   GFRNONAA >60 10/10/2015    Hepatic Function Lab Results  Component Value Date   AST 21 10/10/2015   ALT 16 (L) 10/10/2015   ALBUMIN 4.5 10/10/2015  Electrolytes Lab Results  Component Value Date   NA 136 10/10/2015   K 3.5 10/10/2015   CL 98 (L) 10/10/2015   CALCIUM 9.8 10/10/2015   MG 2.3 10/10/2015    Pain Modulating Vitamins Lab Results  Component Value Date   25OHVITD1 51 10/10/2015   25OHVITD2 <1.0 10/10/2015   25OHVITD3 50 10/10/2015    Coagulation Parameters No results found for: INR, LABPROT, APTT, PLT  Cardiovascular No results found for: BNP, HGB, HCT Note: Lab results reviewed.  Recent Diagnostic Imaging  Mr Carter Spine Wo Contrast Result Date:  03/27/2016 CLINICAL DATA:  Neck pain and left shoulder arm pain and left-sided chest pain. Anterior Carter fusion at C4-5. EXAM: MRI Carter SPINE WITHOUT CONTRAST TECHNIQUE: Multiplanar, multisequence MR imaging of the Carter spine was performed. No intravenous contrast was administered. COMPARISON:  MRI dated 03/01/2014 FINDINGS: Alignment: 2.5 mm retrolisthesis of C3 on C4.  Otherwise normal. Vertebrae: Solid anterior fusion at C4-5. No significant facet arthritis in the Carter spine. No other significant abnormality. Cord: No mass lesion or myelopathy. 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 Disc levels: Craniocervical junction through C2-3:  Normal. C3-4: Retrolisthesis with a broad-based disc osteophyte complex with osteophytes which slightly narrow the right neural foramen. AP dimension of the spinal canal is narrowed to 7 mm and the spinal cord is slightly compressed without myelopathy, unchanged since the prior study. C4-5:  Solid anterior fusion with no residual impingement. C5-6: Small central disc bulge without neural impingement, unchanged. No foraminal stenosis. C6-7: Chronic disc space narrowing. Minimal endplate osteophytes extend into the left neural foramen without significant foraminal stenosis. Asymmetric osteophytes slightly encroach upon the left side of the thecal sac but this is less apparent than on the prior study. This might irritate the left C7 nerve. There is no residual soft disc component. C7-T1:  Normal. IMPRESSION: 1. Slight Carter spinal stenosis at C3-4 without myelopathy. No change since the prior study. 2. Osteophytes slightly encroach upon the left side of the thecal sac at C6-7, less prominent on the prior study. No residual soft tissue component as was apparent on the prior study. 3. 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 Electronically Signed   By: Lorriane Shire M.D.   On: 03/27/2016 13:02   Meds  The patient has a current medication list which includes the following prescription(s): aclidinium bromide, advair diskus, albuterol, albuterol sulfate, aspirin ec, calcium carb-cholecalciferol, cholecalciferol, diphenhydramine, docusate sodium, duloxetine, fluticasone-salmeterol, hydrochlorothiazide, hydroxyzine, ibuprofen, incruse ellipta, loratadine, metformin, multiple vitamins-minerals, oxycodone-acetaminophen, oxycodone-acetaminophen, oxycodone-acetaminophen, pantoprazole, pregabalin, sodium chloride hypertonic, trazodone, and ventolin hfa.  Current Outpatient Prescriptions on File Prior to Visit  Medication Sig  . Aclidinium Bromide (TUDORZA PRESSAIR IN) Inhale 40 mcg into the lungs 2 (two) times daily.  Marland Kitchen ADVAIR DISKUS 250-50 MCG/DOSE AEPB INHALE 1 PUFF 2 (TWO) TIMES A DAY.  Marland Kitchen albuterol (PROVENTIL HFA;VENTOLIN HFA) 108 (90 Base) MCG/ACT inhaler Inhale into the lungs.  . Albuterol Sulfate (VENTOLIN HFA IN) Inhale 1 puff into the lungs.  Marland Kitchen aspirin EC 81 MG tablet Take 81 mg by mouth daily.  . Calcium Carb-Cholecalciferol 600-800 MG-UNIT TABS TAKE 1 TABLET BY MOUTH 2 TIMES A DAY  . cholecalciferol (VITAMIN D) 1000 UNITS tablet Take 2,000 Units by mouth daily.  . diphenhydrAMINE (BENADRYL)  25 mg capsule Take 25 mg by mouth.  . DULoxetine (CYMBALTA) 60 MG capsule Take 60 mg by mouth.  . Fluticasone-Salmeterol (ADVAIR) 500-50 MCG/DOSE AEPB Inhale 1 puff into the lungs 2 (two) times daily.  . hydrochlorothiazide (MICROZIDE) 12.5 MG capsule TAKE ONE CAPSULE BY MOUTH FOR HIGH BLOOD PRESSURE  . hydrOXYzine (ATARAX/VISTARIL) 25 MG tablet Take 25 mg by mouth as needed.  . loratadine (CLARITIN) 10 MG tablet Take 10 mg by mouth.  . metFORMIN (GLUCOPHAGE) 500 MG tablet Take 500 mg by mouth 2 (two) times daily with a meal.  .  Multiple Vitamins-Minerals (PRESERVISION AREDS 2 PO) Take 1 tablet by mouth 2 (two) times daily.  . pantoprazole (PROTONIX) 40 MG tablet Take 40 mg by mouth.  . traZODone (DESYREL) 50 MG tablet Take 50 mg by mouth at bedtime. Reported on 02/20/2016  . VENTOLIN HFA 108 (90 Base) MCG/ACT inhaler INHALE 2 PUFFS EVERY FOUR (4) HOURS AS NEEDED FOR SHORTNESS OF BREATH.   No current facility-administered medications on file prior to visit.     ROS  Constitutional: Denies any fever or chills Gastrointestinal: No reported hemesis, hematochezia, vomiting, or acute GI distress Musculoskeletal: Denies any acute onset joint swelling, redness, loss of ROM, or weakness Neurological: No reported episodes of acute onset apraxia, aphasia, dysarthria, agnosia, amnesia, paralysis, loss of coordination, or loss of consciousness  Allergies  Rick Carter is allergic to atorvastatin and varenicline.  [place]  Medical:  Rick Carter  has a past medical history of Anxiety; COPD (chronic obstructive pulmonary disease) ([place]); Depression; Hernia of abdominal wall; History of alcoholism ([place]) (08/08/2015); History of pneumonectomy (08/08/2015); Hypertension; and Tumor cells. Family: family history includes Alcohol abuse in his father; Kidney disease in his mother. Surgical:  has a past surgical history that includes Tonsillectomy; Lung removal, partial; and Neck surgery. Tobacco:  reports that he has been smoking Cigarettes.  He has a 51.00 pack-year smoking history. He has never used smokeless tobacco. Alcohol:  reports that he does not drink alcohol. Drug:  has no drug history on file.  Constitutional Exam  General appearance: Well nourished, well developed, and well hydrated. In no acute distress Vitals:   06/24/16 1324  BP: (!) 159/70  Pulse: (!) 59  Resp: 18  Temp: 98.1 F (36.7 C)  SpO2: 98%  Weight: 153 lb (69.4 kg)  Height: _0  (1.753 m)  BMI Assessment: Estimated body mass index is 22.59 kg/m as  calculated from the following:   Height as of this encounter: _1  (1.753 m).   Weight as of this encounter: 153 lb (69.4 kg).   BMI interpretation: (18.5-24.9 kg/m2) = Ideal body weight BMI Readings from Last 4 Encounters:  06/24/16 22.59 kg/m  05/13/16 22.74 kg/m  04/02/16 23.48 kg/m  04/01/16 23.33 kg/m   Wt Readings from Last 4 Encounters:  06/24/16 153 lb (69.4 kg)  05/13/16 154 lb (69.9 kg)  04/02/16 159 lb (72.1 kg)  04/01/16 158 lb (71.7 kg)  Psych/Mental status: Alert and oriented x 3 (person, place, & time) Eyes: PERLA Respiratory: No evidence of acute respiratory distress  Carter Spine Exam  Inspection: No masses, redness, or swelling Alignment: Symmetrical Functional ROM: Decreased ROM Stability: No instability detected Muscle strength & Tone: Functionally intact Sensory: Movement-associated pain Palpation: Complains of area being tender to palpation  Upper Extremity (UE) Exam    Side: Right upper extremity  Side: Left upper extremity  Inspection: No masses, redness, swelling, or asymmetry  Inspection: No masses, redness, swelling, or  asymmetry  Functional ROM: ROM appears unrestricted          Functional ROM: ROM appears unrestricted          Muscle strength & Tone: Functionally intact  Muscle strength & Tone: Functionally intact  Sensory: Unimpaired  Sensory: Pain pattern appears Dermatomal  Palpation: Non-contributory  Palpation: Non-contributory   Thoracic Spine Exam  Inspection: No masses, redness, or swelling Alignment: Symmetrical Functional ROM: ROM appears unrestricted Stability: No instability detected Sensory: Unimpaired Muscle strength & Tone: Functionally intact Palpation: Non-contributory  Lumbar Spine Exam  Inspection: No masses, redness, or swelling Alignment: Symmetrical Functional ROM: Decreased ROM Stability: No instability detected Muscle strength & Tone: Functionally intact Sensory: Movement-associated discomfort Palpation:  Tender Provocative Tests: Lumbar Hyperextension and rotation test: Positive bilaterally for facet joint pain. Patrick's Maneuver: evaluation deferred today              Gait & Posture Assessment  Ambulation: Unassisted Gait: Relatively normal for age and body habitus Posture: WNL   Lower Extremity Exam    Side: Right lower extremity  Side: Left lower extremity  Inspection: No masses, redness, swelling, or asymmetry  Inspection: No masses, redness, swelling, or asymmetry  Functional ROM: ROM appears unrestricted          Functional ROM: ROM appears unrestricted          Muscle strength & Tone: Functionally intact  Muscle strength & Tone: Functionally intact  Sensory: Unimpaired  Sensory: Unimpaired  Palpation: Non-contributory  Palpation: Non-contributory   Assessment & Plan  Primary Diagnosis & Pertinent Problem List: The primary encounter diagnosis was Chronic pain. Diagnoses of Long term current use of opiate analgesic, Opiate use, Opioid-induced constipation (OIC), Fibromyalgia, Primary osteoarthritis involving multiple joints, and Chronic Carter radicular pain (Left) were also pertinent to this visit.  Visit Diagnosis: 1. Chronic pain   2. Long term current use of opiate analgesic   3. Opiate use   4. Opioid-induced constipation (OIC)   5. Fibromyalgia   6. Primary osteoarthritis involving multiple joints   7. Chronic Carter radicular pain (Left)     Problems updated and reviewed during this visit: No problems updated. Problem-specific Plan(s): No problem-specific Assessment & Plan notes found for this encounter.  No new Assessment & Plan notes have been filed under this hospital service since the last note was generated. Service: Pain Management  Plan of Care   Problem List Items Addressed This Visit      High   Chronic Carter radicular pain (Left) (Chronic)   Relevant Medications   pregabalin (LYRICA) 150 MG capsule (Start on 07/02/2016)   Other Relevant  Orders   Carter Epidural Injection   Chronic pain - Primary (Chronic)   Relevant Medications   oxyCODONE-acetaminophen (PERCOCET/ROXICET) 5-325 MG tablet (Start on 07/02/2016)   oxyCODONE-acetaminophen (PERCOCET/ROXICET) 5-325 MG tablet (Start on 08/01/2016)   oxyCODONE-acetaminophen (PERCOCET/ROXICET) 5-325 MG tablet (Start on 08/31/2016)   pregabalin (LYRICA) 150 MG capsule (Start on 07/02/2016)   ibuprofen (ADVIL,MOTRIN) 800 MG tablet (Start on 07/02/2016)   Fibromyalgia (Chronic)   Relevant Medications   pregabalin (LYRICA) 150 MG capsule (Start on 07/02/2016)   ibuprofen (ADVIL,MOTRIN) 800 MG tablet (Start on 07/02/2016)   Osteoarthritis (Chronic)   Relevant Medications   oxyCODONE-acetaminophen (PERCOCET/ROXICET) 5-325 MG tablet (Start on 07/02/2016)   oxyCODONE-acetaminophen (PERCOCET/ROXICET) 5-325 MG tablet (Start on 08/01/2016)   oxyCODONE-acetaminophen (PERCOCET/ROXICET) 5-325 MG tablet (Start on 08/31/2016)   ibuprofen (ADVIL,MOTRIN) 800 MG tablet (Start on 07/02/2016)     Medium  Long term current use of opiate analgesic (Chronic)   Opiate use (Chronic)   Opioid-induced constipation (OIC) (Chronic)   Relevant Medications   docusate sodium (COLACE) 100 MG capsule (Start on 07/02/2016)    Other Visit Diagnoses   None.    Pharmacotherapy (Medications Ordered): Meds ordered this encounter  Medications  . oxyCODONE-acetaminophen (PERCOCET/ROXICET) 5-325 MG tablet    Sig: Take 1 tablet by mouth every 6 (six) hours as needed for severe pain.    Dispense:  120 tablet    Refill:  0    Do not place this medication, or any other prescription from our practice, on "Automatic Refill". Patient may have prescription filled one day early if pharmacy is closed on scheduled refill date. Do not fill until: 07/02/16 To last until: 08/01/16  . oxyCODONE-acetaminophen (PERCOCET/ROXICET) 5-325 MG tablet    Sig: Take 1 tablet by mouth every 6 (six) hours as needed for severe pain.     Dispense:  120 tablet    Refill:  0    Do not place this medication, or any other prescription from our practice, on "Automatic Refill". Patient may have prescription filled one day early if pharmacy is closed on scheduled refill date. Do not fill until: 08/01/16 To last until: 08/31/16  . oxyCODONE-acetaminophen (PERCOCET/ROXICET) 5-325 MG tablet    Sig: Take 1 tablet by mouth every 6 (six) hours as needed for severe pain.    Dispense:  120 tablet    Refill:  0    Do not place this medication, or any other prescription from our practice, on "Automatic Refill". Patient may have prescription filled one day early if pharmacy is closed on scheduled refill date. Do not fill until: 08/31/16 To last until: 09/30/16  . pregabalin (LYRICA) 150 MG capsule    Sig: Take 1 capsule (150 mg total) by mouth 2 (two) times daily.    Dispense:  180 capsule    Refill:  0    Do not place this medication, or any other prescription from our practice, on "Automatic Refill". Patient may have prescription filled one day early if pharmacy is closed on scheduled refill date.  Marland Kitchen ibuprofen (ADVIL,MOTRIN) 800 MG tablet    Sig: Take 1 tablet (800 mg total) by mouth every 8 (eight) hours as needed for moderate pain.    Dispense:  90 tablet    Refill:  2    Do not place this medication, or any other prescription from our practice, on "Automatic Refill". Patient may have prescription filled one day early if pharmacy is closed on scheduled refill date.  . docusate sodium (COLACE) 100 MG capsule    Sig: Take 1 capsule (100 mg total) by mouth daily.    Dispense:  90 capsule    Refill:  0    Do not place this medication, or any other prescription from our practice, on "Automatic Refill". Patient may have prescription filled one day early if pharmacy is closed on scheduled refill date.   Lab-work & Procedure Ordered: Orders Placed This Encounter  Procedures  . Carter Epidural Injection   Imaging  Ordered: None  Interventional Therapies: Scheduled:  Palliative left Carter epidural steroid injection under fluoroscopic guidance, without sedation.    Considering:  Palliative bilateral lumbar facet block under fluoroscopic guidance and IV sedation.  Palliative left-sided Carter epidural steroid injection under fluoroscopic guidance, with or without sedation.  Palliative right-sided L5-S1 lumbar epidural steroid injection under fluoroscopic guidance, with or without sedation.  Diagnostic bilateral Carter  facet block under fluoroscopic guidance and IV sedation.  Palliative Right-sided T8-9 thoracic epidural steroid injection under fluoroscopic guidance, without IV sedation.    PRN Procedures:  Palliative bilateral lumbar facet block under fluoroscopic guidance and IV sedation. Palliative left-sided Carter epidural steroid injection under fluoroscopic guidance, with or without sedation.  Palliative right-sided L5-S1 lumbar epidural steroid injection under fluoroscopic guidance, with or without sedation.    Referral(s) or Consult(s): None at this time.  New Prescriptions   No medications on file    Medications administered during this visit: Rick Carter had no medications administered during this visit.  Requested PM Follow-up: Return in 3 months (on 09/23/2016) for Med-Mgmt, In addition, Schedule Procedure, (ASAA).  Future Appointments Date Time Provider [place]  07/02/2016 9:30 AM Milinda Pointer, MD [facility] None  09/23/2016 10:40 AM Milinda Pointer, MD [facility] None    Primary Care Physician: Pcp Not In System Location: [facility] Outpatient Pain Management Facility Note by: Kathlen Brunswick. Dossie Arbour, M.D, DABA, DABAPM, DABPM, DABIPP, FIPP  Pain Score Disclaimer: We use the NRS-11 scale. This is a self-reported, subjective measurement of pain severity with only modest accuracy. It is used primarily to identify changes within a particular patient. It must be  understood that outpatient pain scales are significantly less accurate that those used for research, where they can be applied under ideal controlled circumstances with minimal exposure to variables. In reality, the score is likely to be a combination of pain intensity and pain affect, where pain affect describes the degree of emotional arousal or changes in action readiness caused by the sensory experience of pain. Factors such as social and work situation, setting, emotional state, anxiety levels, expectation, and prior pain experience may influence pain perception and show large inter-individual differences that may also be affected by time variables.  Patient instructions provided during this appointment: Patient Instructions   Pain Management Discharge Instructions  General Discharge Instructions :  If you need to reach your doctor call: Monday-Friday 8:00 am - 4:00 pm at [phone] or toll free [phone].  After clinic hours [phone] to have operator reach doctor.  Bring all of your medication bottles to all your appointments in the pain clinic.  To cancel or reschedule your appointment with Pain Management please remember to call 24 hours in advance to avoid a fee.  Refer to the educational materials which you have been given on: General Risks, I had my Procedure. Discharge Instructions, Post Sedation.  Post Procedure Instructions:  The drugs you were given will stay in your system until tomorrow, so for the next 24 hours you should not drive, make any legal decisions or drink any alcoholic beverages.  You may eat anything you prefer, but it is better to start with liquids then soups and crackers, and gradually work up to solid foods.  Please notify your doctor immediately if you have any unusual bleeding, trouble breathing or pain that is not related to your normal pain.  Depending on the type of procedure that was done, some parts of your body may feel week and/or numb.   This usually clears up by tonight or the next day.  Walk with the use of an assistive device or accompanied by an adult for the 24 hours.  You may use ice on the affected area for the first 24 hours.  Put ice in a Ziploc bag and cover with a towel and place against area 15 minutes on 15 minutes off.  You may switch to heat after 24 hours.Epidural  Steroid Injection An epidural steroid injection is given to relieve pain in your neck, back, or legs that is caused by the irritation or swelling of a nerve root. This procedure involves injecting a steroid and numbing medicine (anesthetic) into the epidural space. The epidural space is the space between the outer covering of your spinal cord and the bones that form your backbone (vertebra).  LET [facility] CARE PROVIDER KNOW ABOUT:   Any allergies you have.  All medicines you are taking, including vitamins, herbs, eye drops, creams, and over-the-counter medicines such as aspirin.  Previous problems you or members of your family have had with the use of anesthetics.  Any blood disorders or blood clotting disorders you have.  Previous surgeries you have had.  Medical conditions you have. RISKS AND COMPLICATIONS Generally, this is a safe procedure. However, as with any procedure, complications can occur. Possible complications of epidural steroid injection include:  Headache.  Bleeding.  Infection.  Allergic reaction to the medicines.  Damage to your nerves. The response to this procedure depends on the underlying cause of the pain and its duration. People who have long-term (chronic) pain are less likely to benefit from epidural steroids than are those people whose pain comes on strong and suddenly. BEFORE THE PROCEDURE   Ask your health care provider about changing or stopping your regular medicines. You may be advised to stop taking blood-thinning medicines a few days before the procedure.  You may be given medicines to reduce  anxiety.  Arrange for someone to take you home after the procedure. PROCEDURE   You will remain awake during the procedure. You may receive medicine to make you relaxed.  You will be asked to lie on your stomach.  The injection site will be cleaned.  The injection site will be numbed with a medicine (local anesthetic).  A needle will be injected through your skin into the epidural space.  Your health care provider will use an X-ray machine to ensure that the steroid is delivered closest to the affected nerve. You may have minimal discomfort at this time.  Once the needle is in the right position, the local anesthetic and the steroid will be injected into the epidural space.  The needle will then be removed and a bandage will be applied to the injection site. AFTER THE PROCEDURE   You may be monitored for a short time before you go home.  You may feel weakness or numbness in your arm or leg, which disappears within hours.  You may be allowed to eat, drink, and take your regular medicine.  You may have soreness at the site of the injection.   This information is not intended to replace advice given to you by your health care provider. Make sure you discuss any questions you have with your health care provider.   Document Released: 01/07/2008 Document Revised: 06/02/2013 Document Reviewed: 03/19/2013 Elsevier Interactive Patient Education 2016 [place]  What are the risk, side effects and possible complications? Generally speaking, most procedures are safe.  However, with any procedure there are risks, side effects, and the possibility of complications.  The risks and complications are dependent upon the sites that are lesioned, or the type of nerve block to be performed.  The closer the procedure is to the spine, the more serious the risks are.  Great care is taken when placing the radio frequency needles, block needles or lesioning probes, but  sometimes complications can occur. 1. Infection:  Any time there is an injection through the skin, there is a risk of infection.  This is why sterile conditions are used for these blocks.  There are four possible types of infection. 1. Localized skin infection. 2. Central Nervous System Infection-This can be in the form of Meningitis, which can be deadly. 3. Epidural Infections-This can be in the form of an epidural abscess, which can cause pressure inside of the spine, causing compression of the spinal cord with subsequent paralysis. This would require an emergency surgery to decompress, and there are no guarantees that the patient would recover from the paralysis. 4. Discitis-This is an infection of the intervertebral discs.  It occurs in about 1% of discography procedures.  It is difficult to treat and it may lead to surgery.        2. Pain: the needles have to go through skin and soft tissues, will cause soreness.       3. Damage to internal structures:  The nerves to be lesioned may be near blood vessels or    other nerves which can be potentially damaged.       4. Bleeding: Bleeding is more common if the patient is taking blood thinners such as  aspirin, Coumadin, Ticiid, Plavix, etc., or if he/she have some genetic predisposition  such as hemophilia. Bleeding into the spinal canal can cause compression of the spinal  cord with subsequent paralysis.  This would require an emergency surgery to  decompress and there are no guarantees that the patient would recover from the  paralysis.       5. Pneumothorax:  Puncturing of a lung is a possibility, every time a needle is introduced in  the area of the chest or upper back.  Pneumothorax refers to free air around the  collapsed lung(s), inside of the thoracic cavity (chest cavity).  Another two possible  complications related to a similar event would include: Hemothorax and Chylothorax.   These are variations of the Pneumothorax, where instead of air  around the collapsed  lung(s), you may have blood or chyle, respectively.       6. Spinal headaches: They may occur with any procedures in the area of the spine.       7. Persistent CSF (Cerebro-Spinal Fluid) leakage: This is a rare problem, but may occur  with prolonged intrathecal or epidural catheters either due to the formation of a fistulous  track or a dural tear.       8. Nerve damage: By working so close to the spinal cord, there is always a possibility of  nerve damage, which could be as serious as a permanent spinal cord injury with  paralysis.       9. Death:  Although rare, severe deadly allergic reactions known as "Anaphylactic  reaction" can occur to any of the medications used.      10. Worsening of the symptoms:  We can always make thing worse.  What are the chances of something like this happening? Chances of any of this occuring are extremely low.  By statistics, you have more of a chance of getting killed in a motor vehicle accident: while driving to the hospital than any of the above occurring .  Nevertheless, you should be aware that they are possibilities.  In general, it is similar to taking a shower.  Everybody knows that you can slip, hit your head and get killed.  Does that mean that you should not shower again?  Nevertheless always keep  in mind that statistics do not mean anything if you happen to be on the wrong side of them.  Even if a procedure has a 1 (one) in a 1,000,000 (million) chance of going wrong, it you happen to be that one..Also, keep in mind that by statistics, you have more of a chance of having something go wrong when taking medications.  Who should not have this procedure? If you are on a blood thinning medication (e.g. Coumadin, Plavix, see list of "Blood Thinners"), or if you have an active infection going on, you should not have the procedure.  If you are taking any blood thinners, please inform your physician.  How should I prepare for this  procedure?  Do not eat or drink anything at least six hours prior to the procedure.  Bring a driver with you .  It cannot be a taxi.  Come accompanied by an adult that can drive you back, and that is strong enough to help you if your legs get weak or numb from the local anesthetic.  Take all of your medicines the morning of the procedure with just enough water to swallow them.  If you have diabetes, make sure that you are scheduled to have your procedure done first thing in the morning, whenever possible.  If you have diabetes, take only half of your insulin dose and notify our nurse that you have done so as soon as you arrive at the clinic.  If you are diabetic, but only take blood sugar pills (oral hypoglycemic), then do not take them on the morning of your procedure.  You may take them after you have had the procedure.  Do not take aspirin or any aspirin-containing medications, at least eleven (11) days prior to the procedure.  They may prolong bleeding.  Wear loose fitting clothing that may be easy to take off and that you would not mind if it got stained with Betadine or blood.  Do not wear any jewelry or perfume  Remove any nail coloring.  It will interfere with some of our monitoring equipment.  NOTE: Remember that this is not meant to be interpreted as a complete list of all possible complications.  Unforeseen problems may occur.  BLOOD THINNERS The following drugs contain aspirin or other products, which can cause increased bleeding during surgery and should not be taken for 2 weeks prior to and 1 week after surgery.  If you should need take something for relief of minor pain, you may take acetaminophen which is found in Tylenol,m Datril, Anacin-3 and Panadol. It is not blood thinner. The products listed below are.  Do not take any of the products listed below in addition to any listed on your instruction sheet.  A.P.C or A.P.C with Codeine Codeine Phosphate Capsules #3  Ibuprofen Ridaura  ABC compound Congesprin Imuran rimadil  Advil Cope Indocin Robaxisal  Alka-Seltzer Effervescent Pain Reliever and Antacid Coricidin or Coricidin-D  Indomethacin Rufen  Alka-Seltzer plus Cold Medicine Cosprin Ketoprofen S-A-C Tablets  Anacin Analgesic Tablets or Capsules Coumadin Korlgesic Salflex  Anacin Extra Strength Analgesic tablets or capsules CP-2 Tablets Lanoril Salicylate  Anaprox Cuprimine Capsules Levenox Salocol  Anexsia-D Dalteparin Magan Salsalate  Anodynos Darvon compound Magnesium Salicylate Sine-off  Ansaid Dasin Capsules Magsal Sodium Salicylate  Anturane Depen Capsules Marnal Soma  APF Arthritis pain formula Dewitt's Pills Measurin Stanback  Argesic Dia-Gesic Meclofenamic Sulfinpyrazone  Arthritis Bayer Timed Release Aspirin Diclofenac Meclomen Sulindac  Arthritis pain formula Anacin Dicumarol Medipren Supac  Analgesic (Safety  coated) Arthralgen Diffunasal Mefanamic Suprofen  Arthritis Strength Bufferin Dihydrocodeine Mepro Compound Suprol  Arthropan liquid Dopirydamole Methcarbomol with Aspirin Synalgos  ASA tablets/Enseals Disalcid Micrainin Tagament  Ascriptin Doan's Midol Talwin  Ascriptin A/D Dolene Mobidin Tanderil  Ascriptin Extra Strength Dolobid Moblgesic Ticlid  Ascriptin with Codeine Doloprin or Doloprin with Codeine Momentum Tolectin  Asperbuf Duoprin Mono-gesic Trendar  Aspergum Duradyne Motrin or Motrin IB Triminicin  Aspirin plain, buffered or enteric coated Durasal Myochrisine Trigesic  Aspirin Suppositories Easprin Nalfon Trillsate  Aspirin with Codeine Ecotrin Regular or Extra Strength Naprosyn Uracel  Atromid-S Efficin Naproxen Ursinus  Auranofin Capsules Elmiron Neocylate Vanquish  Axotal Emagrin Norgesic Verin  Azathioprine Empirin or Empirin with Codeine Normiflo Vitamin E  Azolid Emprazil Nuprin Voltaren  Bayer Aspirin plain, buffered or children's or timed BC Tablets or powders Encaprin Orgaran Warfarin Sodium   Buff-a-Comp Enoxaparin Orudis Zorpin  Buff-a-Comp with Codeine Equegesic Os-Cal-Gesic   Buffaprin Excedrin plain, buffered or Extra Strength Oxalid   Bufferin Arthritis Strength Feldene Oxphenbutazone   Bufferin plain or Extra Strength Feldene Capsules Oxycodone with Aspirin   Bufferin with Codeine Fenoprofen Fenoprofen Pabalate or Pabalate-SF   Buffets II Flogesic Panagesic   Buffinol plain or Extra Strength Florinal or Florinal with Codeine Panwarfarin   Buf-Tabs Flurbiprofen Penicillamine   Butalbital Compound Four-way cold tablets Penicillin   Butazolidin Fragmin Pepto-Bismol   Carbenicillin Geminisyn Percodan   Carna Arthritis Reliever Geopen Persantine   Carprofen Gold's salt Persistin   Chloramphenicol Goody's Phenylbutazone   Chloromycetin Haltrain Piroxlcam   Clmetidine heparin Plaquenil   Cllnoril Hyco-pap Ponstel   Clofibrate Hydroxy chloroquine Propoxyphen         Before stopping any of these medications, be sure to consult the physician who ordered them.  Some, such as Coumadin (Warfarin) are ordered to prevent or treat serious conditions such as "deep thrombosis", "pumonary embolisms", and other heart problems.  The amount of time that you may need off of the medication may also vary with the medication and the reason for which you were taking it.  If you are taking any of these medications, please make sure you notify your pain physician before you undergo any procedures.

## 2016-06-24 NOTE — Patient Instructions (Signed)
Pain Management Discharge Instructions  General Discharge Instructions :  If you need to reach your doctor call: Monday-Friday 8:00 am - 4:00 pm at [phone] or toll free [phone].  After clinic hours [phone] to have operator reach doctor.  Bring all of your medication bottles to all your appointments in the pain clinic.  To cancel or reschedule your appointment with Pain Management please remember to call 24 hours in advance to avoid a fee.  Refer to the educational materials which you have been given on: General Risks, I had my Procedure. Discharge Instructions, Post Sedation.  Post Procedure Instructions:  The drugs you were given will stay in your system until tomorrow, so for the next 24 hours you should not drive, make any legal decisions or drink any alcoholic beverages.  You may eat anything you prefer, but it is better to start with liquids then soups and crackers, and gradually work up to solid foods.  Please notify your doctor immediately if you have any unusual bleeding, trouble breathing or pain that is not related to your normal pain.  Depending on the type of procedure that was done, some parts of your body may feel week and/or numb.  This usually clears up by tonight or the next day.  Walk with the use of an assistive device or accompanied by an adult for the 24 hours.  You may use ice on the affected area for the first 24 hours.  Put ice in a Ziploc bag and cover with a towel and place against area 15 minutes on 15 minutes off.  You may switch to heat after 24 hours.Epidural Steroid Injection An epidural steroid injection is given to relieve pain in your neck, back, or legs that is caused by the irritation or swelling of a nerve root. This procedure involves injecting a steroid and numbing medicine (anesthetic) into the epidural space. The epidural space is the space between the outer covering of your spinal cord and the bones that form your backbone  (vertebra).  LET [facility] CARE PROVIDER KNOW ABOUT:   Any allergies you have.  All medicines you are taking, including vitamins, herbs, eye drops, creams, and over-the-counter medicines such as aspirin.  Previous problems you or members of your family have had with the use of anesthetics.  Any blood disorders or blood clotting disorders you have.  Previous surgeries you have had.  Medical conditions you have. RISKS AND COMPLICATIONS Generally, this is a safe procedure. However, as with any procedure, complications can occur. Possible complications of epidural steroid injection include:  Headache.  Bleeding.  Infection.  Allergic reaction to the medicines.  Damage to your nerves. The response to this procedure depends on the underlying cause of the pain and its duration. People who have long-term (chronic) pain are less likely to benefit from epidural steroids than are those people whose pain comes on strong and suddenly. BEFORE THE PROCEDURE   Ask your health care provider about changing or stopping your regular medicines. You may be advised to stop taking blood-thinning medicines a few days before the procedure.  You may be given medicines to reduce anxiety.  Arrange for someone to take you home after the procedure. PROCEDURE   You will remain awake during the procedure. You may receive medicine to make you relaxed.  You will be asked to lie on your stomach.  The injection site will be cleaned.  The injection site will be numbed with a medicine (local anesthetic).  A needle will be  injected through your skin into the epidural space.  Your health care provider will use an X-ray machine to ensure that the steroid is delivered closest to the affected nerve. You may have minimal discomfort at this time.  Once the needle is in the right position, the local anesthetic and the steroid will be injected into the epidural space.  The needle will then be removed and a  bandage will be applied to the injection site. AFTER THE PROCEDURE   You may be monitored for a short time before you go home.  You may feel weakness or numbness in your arm or leg, which disappears within hours.  You may be allowed to eat, drink, and take your regular medicine.  You may have soreness at the site of the injection.   This information is not intended to replace advice given to you by your health care provider. Make sure you discuss any questions you have with your health care provider.   Document Released: 01/07/2008 Document Revised: 06/02/2013 Document Reviewed: 03/19/2013 Elsevier Interactive Patient Education 2016 [place]  What are the risk, side effects and possible complications? Generally speaking, most procedures are safe.  However, with any procedure there are risks, side effects, and the possibility of complications.  The risks and complications are dependent upon the sites that are lesioned, or the type of nerve block to be performed.  The closer the procedure is to the spine, the more serious the risks are.  Great care is taken when placing the radio frequency needles, block needles or lesioning probes, but sometimes complications can occur. 1. Infection: Any time there is an injection through the skin, there is a risk of infection.  This is why sterile conditions are used for these blocks.  There are four possible types of infection. 1. Localized skin infection. 2. Central Nervous System Infection-This can be in the form of Meningitis, which can be deadly. 3. Epidural Infections-This can be in the form of an epidural abscess, which can cause pressure inside of the spine, causing compression of the spinal cord with subsequent paralysis. This would require an emergency surgery to decompress, and there are no guarantees that the patient would recover from the paralysis. 4. Discitis-This is an infection of the intervertebral discs.   It occurs in about 1% of discography procedures.  It is difficult to treat and it may lead to surgery.        2. Pain: the needles have to go through skin and soft tissues, will cause soreness.       3. Damage to internal structures:  The nerves to be lesioned may be near blood vessels or    other nerves which can be potentially damaged.       4. Bleeding: Bleeding is more common if the patient is taking blood thinners such as  aspirin, Coumadin, Ticiid, Plavix, etc., or if he/she have some genetic predisposition  such as hemophilia. Bleeding into the spinal canal can cause compression of the spinal  cord with subsequent paralysis.  This would require an emergency surgery to  decompress and there are no guarantees that the patient would recover from the  paralysis.       5. Pneumothorax:  Puncturing of a lung is a possibility, every time a needle is introduced in  the area of the chest or upper back.  Pneumothorax refers to free air around the  collapsed lung(s), inside of the thoracic cavity (chest cavity).  Another two  possible  complications related to a similar event would include: Hemothorax and Chylothorax.   These are variations of the Pneumothorax, where instead of air around the collapsed  lung(s), you may have blood or chyle, respectively.       6. Spinal headaches: They may occur with any procedures in the area of the spine.       7. Persistent CSF (Cerebro-Spinal Fluid) leakage: This is a rare problem, but may occur  with prolonged intrathecal or epidural catheters either due to the formation of a fistulous  track or a dural tear.       8. Nerve damage: By working so close to the spinal cord, there is always a possibility of  nerve damage, which could be as serious as a permanent spinal cord injury with  paralysis.       9. Death:  Although rare, severe deadly allergic reactions known as "Anaphylactic  reaction" can occur to any of the medications used.      10. Worsening of the symptoms:  We  can always make thing worse.  What are the chances of something like this happening? Chances of any of this occuring are extremely low.  By statistics, you have more of a chance of getting killed in a motor vehicle accident: while driving to the hospital than any of the above occurring .  Nevertheless, you should be aware that they are possibilities.  In general, it is similar to taking a shower.  Everybody knows that you can slip, hit your head and get killed.  Does that mean that you should not shower again?  Nevertheless always keep in mind that statistics do not mean anything if you happen to be on the wrong side of them.  Even if a procedure has a 1 (one) in a 1,000,000 (million) chance of going wrong, it you happen to be that one..Also, keep in mind that by statistics, you have more of a chance of having something go wrong when taking medications.  Who should not have this procedure? If you are on a blood thinning medication (e.g. Coumadin, Plavix, see list of "Blood Thinners"), or if you have an active infection going on, you should not have the procedure.  If you are taking any blood thinners, please inform your physician.  How should I prepare for this procedure?  Do not eat or drink anything at least six hours prior to the procedure.  Bring a driver with you .  It cannot be a taxi.  Come accompanied by an adult that can drive you back, and that is strong enough to help you if your legs get weak or numb from the local anesthetic.  Take all of your medicines the morning of the procedure with just enough water to swallow them.  If you have diabetes, make sure that you are scheduled to have your procedure done first thing in the morning, whenever possible.  If you have diabetes, take only half of your insulin dose and notify our nurse that you have done so as soon as you arrive at the clinic.  If you are diabetic, but only take blood sugar pills (oral hypoglycemic), then do not take them  on the morning of your procedure.  You may take them after you have had the procedure.  Do not take aspirin or any aspirin-containing medications, at least eleven (11) days prior to the procedure.  They may prolong bleeding.  Wear loose fitting clothing that may be easy to take off  and that you would not mind if it got stained with Betadine or blood.  Do not wear any jewelry or perfume  Remove any nail coloring.  It will interfere with some of our monitoring equipment.  NOTE: Remember that this is not meant to be interpreted as a complete list of all possible complications.  Unforeseen problems may occur.  BLOOD THINNERS The following drugs contain aspirin or other products, which can cause increased bleeding during surgery and should not be taken for 2 weeks prior to and 1 week after surgery.  If you should need take something for relief of minor pain, you may take acetaminophen which is found in Tylenol,m Datril, Anacin-3 and Panadol. It is not blood thinner. The products listed below are.  Do not take any of the products listed below in addition to any listed on your instruction sheet.  A.P.C or A.P.C with Codeine Codeine Phosphate Capsules #3 Ibuprofen Ridaura  ABC compound Congesprin Imuran rimadil  Advil Cope Indocin Robaxisal  Alka-Seltzer Effervescent Pain Reliever and Antacid Coricidin or Coricidin-D  Indomethacin Rufen  Alka-Seltzer plus Cold Medicine Cosprin Ketoprofen S-A-C Tablets  Anacin Analgesic Tablets or Capsules Coumadin Korlgesic Salflex  Anacin Extra Strength Analgesic tablets or capsules CP-2 Tablets Lanoril Salicylate  Anaprox Cuprimine Capsules Levenox Salocol  Anexsia-D Dalteparin Magan Salsalate  Anodynos Darvon compound Magnesium Salicylate Sine-off  Ansaid Dasin Capsules Magsal Sodium Salicylate  Anturane Depen Capsules Marnal Soma  APF Arthritis pain formula Dewitt's Pills Measurin Stanback  Argesic Dia-Gesic Meclofenamic Sulfinpyrazone  Arthritis Bayer  Timed Release Aspirin Diclofenac Meclomen Sulindac  Arthritis pain formula Anacin Dicumarol Medipren Supac  Analgesic (Safety coated) Arthralgen Diffunasal Mefanamic Suprofen  Arthritis Strength Bufferin Dihydrocodeine Mepro Compound Suprol  Arthropan liquid Dopirydamole Methcarbomol with Aspirin Synalgos  ASA tablets/Enseals Disalcid Micrainin Tagament  Ascriptin Doan's Midol Talwin  Ascriptin A/D Dolene Mobidin Tanderil  Ascriptin Extra Strength Dolobid Moblgesic Ticlid  Ascriptin with Codeine Doloprin or Doloprin with Codeine Momentum Tolectin  Asperbuf Duoprin Mono-gesic Trendar  Aspergum Duradyne Motrin or Motrin IB Triminicin  Aspirin plain, buffered or enteric coated Durasal Myochrisine Trigesic  Aspirin Suppositories Easprin Nalfon Trillsate  Aspirin with Codeine Ecotrin Regular or Extra Strength Naprosyn Uracel  Atromid-S Efficin Naproxen Ursinus  Auranofin Capsules Elmiron Neocylate Vanquish  Axotal Emagrin Norgesic Verin  Azathioprine Empirin or Empirin with Codeine Normiflo Vitamin E  Azolid Emprazil Nuprin Voltaren  Bayer Aspirin plain, buffered or children's or timed BC Tablets or powders Encaprin Orgaran Warfarin Sodium  Buff-a-Comp Enoxaparin Orudis Zorpin  Buff-a-Comp with Codeine Equegesic Os-Cal-Gesic   Buffaprin Excedrin plain, buffered or Extra Strength Oxalid   Bufferin Arthritis Strength Feldene Oxphenbutazone   Bufferin plain or Extra Strength Feldene Capsules Oxycodone with Aspirin   Bufferin with Codeine Fenoprofen Fenoprofen Pabalate or Pabalate-SF   Buffets II Flogesic Panagesic   Buffinol plain or Extra Strength Florinal or Florinal with Codeine Panwarfarin   Buf-Tabs Flurbiprofen Penicillamine   Butalbital Compound Four-way cold tablets Penicillin   Butazolidin Fragmin Pepto-Bismol   Carbenicillin Geminisyn Percodan   Carna Arthritis Reliever Geopen Persantine   Carprofen Gold's salt Persistin   Chloramphenicol Goody's Phenylbutazone   Chloromycetin  [place] Piroxlcam   Clmetidine heparin Plaquenil   Cllnoril Hyco-pap Ponstel   Clofibrate Hydroxy chloroquine Propoxyphen         Before stopping any of these medications, be sure to consult the physician who ordered them.  Some, such as Coumadin (Warfarin) are ordered to prevent or treat serious conditions such as "deep thrombosis", "pumonary embolisms", and  other heart problems.  The amount of time that you may need off of the medication may also vary with the medication and the reason for which you were taking it.  If you are taking any of these medications, please make sure you notify your pain physician before you undergo any procedures.

## 2016-06-24 NOTE — Progress Notes (Signed)
Safety precautions to be maintained throughout the outpatient stay will include: orient to surroundings, keep bed in low position, maintain call bell within reach at all times, provide assistance with transfer out of bed and ambulation. Pill count oxycodone 5/341m is 56/120 tabs filled on 06/08/2016

## 2016-06-24 NOTE — Progress Notes (Signed)
Safety precautions to be maintained throughout the outpatient stay will include: orient to surroundings, keep bed in low position, maintain call bell within reach at all times, provide assistance with transfer out of bed and ambulation.  Bottle labeled Oxycodone/acet  #56/120   Filled 06/08/2016

## 2016-07-02 ENCOUNTER — Ambulatory Visit: Payer: Medicare Other | Admitting: Pain Medicine

## 2016-07-18 ENCOUNTER — Ambulatory Visit
Admission: RE | Admit: 2016-07-18 | Discharge: 2016-07-18 | Disposition: A | Discharge status: Home / Self Care | Payer: Medicare Other | Source: Ambulatory Visit | Attending: Pain Medicine | Admitting: Pain Medicine

## 2016-07-18 ENCOUNTER — Ambulatory Visit (HOSPITAL_BASED_OUTPATIENT_CLINIC_OR_DEPARTMENT_OTHER): Payer: Medicare Other | Admitting: Pain Medicine

## 2016-07-18 ENCOUNTER — Encounter: Payer: Self-pay | Admitting: Pain Medicine

## 2016-07-18 VITALS — BP 146/78 | HR 76 | Temp 97.8°F | Resp 16 | Ht 69.0 in | Wt 154.0 lb

## 2016-07-18 DIAGNOSIS — M961 Postlaminectomy syndrome, not elsewhere classified: Secondary | ICD-10-CM | POA: Diagnosis not present

## 2016-07-18 DIAGNOSIS — G8929 Other chronic pain: Secondary | ICD-10-CM | POA: Diagnosis not present

## 2016-07-18 DIAGNOSIS — M5412 Radiculopathy, cervical region: Secondary | ICD-10-CM | POA: Diagnosis present

## 2016-07-18 DIAGNOSIS — M47812 Spondylosis without myelopathy or radiculopathy, cervical region: Secondary | ICD-10-CM

## 2016-07-18 MED ORDER — IOPAMIDOL (ISOVUE-M 200) INJECTION 41%
INTRAMUSCULAR | Status: AC
Start: 2016-07-18 — End: 2016-07-18
  Administered 2016-07-18: 12:00:00
  Filled 2016-07-18: qty 10

## 2016-07-18 MED ORDER — DEXAMETHASONE SODIUM PHOSPHATE 10 MG/ML IJ SOLN: 10.0000 mg | Freq: Once | INTRAMUSCULAR | Status: DC

## 2016-07-18 MED ORDER — ROPIVACAINE HCL 2 MG/ML IJ SOLN: 2.0000 mL | Freq: Once | INTRAMUSCULAR | Status: DC

## 2016-07-18 MED ORDER — SODIUM CHLORIDE 0.9% FLUSH: 2.0000 mL | Freq: Once | INTRAVENOUS | Status: DC

## 2016-07-18 MED ORDER — LIDOCAINE HCL (PF) 1 % IJ SOLN
INTRAMUSCULAR | Status: AC
  Administered 2016-07-18: 12:00:00
  Filled 2016-07-18: qty 5

## 2016-07-18 MED ORDER — ROPIVACAINE HCL 2 MG/ML IJ SOLN
INTRAMUSCULAR | Status: AC
  Administered 2016-07-18: 12:00:00
  Filled 2016-07-18: qty 10

## 2016-07-18 MED ORDER — LIDOCAINE HCL (PF) 1 % IJ SOLN: 10.0000 mL | Freq: Once | INTRAMUSCULAR | Status: DC

## 2016-07-18 MED ORDER — IOPAMIDOL (ISOVUE-M 200) INJECTION 41%: 10.0000 mL | Freq: Once | INTRAMUSCULAR | Status: DC

## 2016-07-18 MED ORDER — SODIUM CHLORIDE 0.9 % IJ SOLN
INTRAMUSCULAR | Status: AC
  Administered 2016-07-18: 12:00:00
  Filled 2016-07-18: qty 20

## 2016-07-18 MED ORDER — DEXAMETHASONE SODIUM PHOSPHATE 10 MG/ML IJ SOLN
INTRAMUSCULAR | Status: AC
  Administered 2016-07-18: 12:00:00
  Filled 2016-07-18: qty 1

## 2016-07-18 NOTE — Progress Notes (Signed)
Patient's Name: Rick BLANKENHORN Sr.  MRN: [id number]  Referring Provider: No ref. provider found  DOB: 06-27-1951  PCP: Pcp Not In System  DOS: 07/18/2016  Note by: Kamel Haven A. Dossie Arbour, MD  Service setting: Ambulatory outpatient  Location: [facility] (AMB) Pain Management Facility  Visit type: Procedure  Specialty: Interventional Pain Management  Patient type: Established   Primary Reason for Visit: Interventional Pain Management Treatment. CC: Neck Pain  Procedure:  Anesthesia, Analgesia, Anxiolysis:  Type: Diagnostic, Inter-Laminar, Epidural Steroid Injection Region: Posterior Cervico-thoracic Region Level: C7-T1 Laterality: Left-Sided Paramedial    Type: Local Anesthesia Local Anesthetic: Lidocaine 1% Route: Infiltration ([place]/IM) IV Access: Declined Sedation: Declined  Indication(s): Analgesia     Indications: 1. Chronic cervical radicular pain (Left)   2. Cervical spondylosis   3. Failed cervical surgery syndrome    Pain Score: Pre-procedure: 4 /10 Post-procedure: 2 /10  Pre-Procedure Assessment:  Rick Carter is a 65 y.o. year old, male patient, seen today for interventional treatment. 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[place]); Atrial fibrillation ([place]); Bipolar affective disorder ([place]); Chronic obstructive pulmonary disease ([place]); Degeneration of intervertebral disc of cervical region; Colon, diverticulosis; Acid reflux; Essential (primary) hypertension; HLD (hyperlipidemia); Breath shortness; Compulsive tobacco user syndrome; Diverticular disease of large intestine; Post-thoracotomy pain syndrome; Encounter for therapeutic drug level monitoring; Long term current use of opiate analgesic; Long term prescription opiate use; Uncomplicated opioid dependence ([place]); Opiate use; Fibromyalgia; Chronic pain syndrome; Chronic pain; Lumbar facet syndrome (Location of Primary Source of Pain) (Bilateral) (R>L); History of alcoholism ([place]); Chronic low back pain (Location of Primary  Source of Pain) (midline); Lumbar spondylosis; Cervical spondylosis; Failed cervical surgery syndrome; Nicotine dependence; Tobacco abuse; Substance Use Disorder Risk: High; History of right-sided partial pneumonectomy; Atherosclerotic peripheral vascular disease ([place]); Musculoskeletal pain; Myofascial pain; Neurogenic pain; Neuropathic pain; Opioid-induced constipation (OIC); Osteoarthritis; NSAID induced gastritis; Trigger finger of right hand (middle finger); Thoracic spine pain; Thoracic radicular pain (Right); Chronic lumbar radicular pain (L5/S1 dermatome) (Location of Secondary source of pain) (Right); Lumbar foraminal stenosis (L5-S1) (Right); Lumbar facet arthropathy (Bilateral) (R>L); Lumbar disc protrusion (L3-4) (Right); Chronic cervical radicular pain (Left); and 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 His primarily concern today is the Neck Pain   Pain Type: Chronic pain Pain Location: Neck Pain Orientation: Left Pain Descriptors / Indicators: Aching Pain Frequency: Constant  Date of Last Visit: 06/24/16 Service Provided on Last Visit: Med Refill  Coagulation Parameters No results found for: INR, LABPROT, APTT, PLT  Verification of the correct person, correct site (including marking of site), and correct procedure were performed and confirmed by the patient.  Consent: Secured. Under the influence of no sedatives a written informed consent was obtained, after having provided information on the risks and possible complications. To fulfill our ethical and legal obligations, as recommended by the American Medical Association's Code of Ethics, we have provided information to the patient about our clinical impression; the nature and purpose of the treatment or procedure; the risks, benefits, and possible complications of the intervention; alternatives; the risk(s) and benefit(s) of the alternative treatment(s) or procedure(s); and the risk(s) and benefit(s) of doing nothing. The  patient was provided information about the risks and possible complications associated with the procedure. These include, but are not limited to, failure to achieve desired goals, infection, bleeding, organ or nerve damage, allergic reactions, paralysis, and death. In the case of spinal procedures these may include, but are not limited to, failure to achieve desired goals, infection, bleeding, organ  or nerve damage, allergic reactions, paralysis, and death. In addition, the patient was informed that Medicine is not an exact science; therefore, there is also the possibility of unforeseen risks and possible complications that may result in a catastrophic outcome. The patient indicated having understood very clearly. We have given the patient no guarantees and we have made no promises. Enough time was given to the patient to ask questions, all of which were answered to the patient's satisfaction.  Consent Attestation: I, the ordering provider, attest that I have discussed with the patient the benefits, risks, side-effects, alternatives, likelihood of achieving goals, and potential problems during recovery for the procedure that I have provided informed consent.  Pre-Procedure Preparation: Safety Precautions: Allergies reviewed. Appropriate site, procedure, and patient were confirmed by following the Joint Commission's Universal Protocol (UP.01.01.01), in the form of a "Time Out". The patient was asked to confirm marked site and procedure, before commencing. The patient was asked about blood thinners, or active infections, both of which were denied. Patient was assessed for positional comfort and all pressure points were checked before starting procedure. Infection Control Precautions: Sterile technique used. Standard Universal Precautions were taken as recommended by the Department of [facility] for Disease Control and Prevention (CDC). Standard pre-surgical skin prep was conducted. Respiratory  hygiene and cough etiquette was practiced. Hand hygiene observed. Safe injection practices and needle disposal techniques followed. SDV (single dose vial) medications used. Medications properly checked for expiration dates and contaminants. Personal protective equipment (PPE) used: Surgical mask. Sterile Radiation-resistant gloves. Monitoring:  As per clinic protocol. Vitals:   07/18/16 1112 07/18/16 1150 07/18/16 1200 07/18/16 1207  BP: (!) 153/63 (!) 143/95 137/80 (!) 146/78  Pulse: 68 75 78 76  Resp: _0 Temp: 97.8 F (36.6 C)     SpO2: 99% 98% 97% 98%  Weight: 154 lb (69.9 kg)     Height: 5' 9" (1.753 m)     Calculated BMI: Body mass index is 22.74 kg/m. Allergies: He is allergic to atorvastatin and varenicline.. Allergy Precautions: None required  Description of Procedure Process:   Time-out: "Time-out" completed before starting procedure, as per protocol. Position: Prone with head of the table was raised to facilitate breathing. Target Area: For Epidural Steroid injections the target is the interlaminar space, initially targeting the lower border of the superior vertebral body lamina. Approach: Paramedial approach. Area Prepped: Entire PosteriorCervical Region Prepping solution: ChloraPrep (2% chlorhexidine gluconate and 70% isopropyl alcohol) Safety Precautions: Aspiration looking for blood return was conducted prior to all injections. At no point did we inject any substances, as a needle was being advanced. No attempts were made at seeking any paresthesias. Safe injection practices and needle disposal techniques used. Medications properly checked for expiration dates. SDV (single dose vial) medications used. Description of the Procedure: Protocol guidelines were followed. The procedure needle was introduced through the skin, ipsilateral to the reported pain, and advanced to the target area. Bone was contacted and the needle walked caudad, until the lamina was cleared. The  epidural space was identified using "loss-of-resistance technique" with 2-3 ml of PF-NaCl (0.9% NSS), in a 5cc LOR glass syringe. EBL: None Materials & Medications:  Needle(s) Used: 20g - 10cm, Tuohy-style epidural needle Medication(s): see below.  Imaging Guidance:  Type of Imaging Technique: Fluoroscopy Guidance (Spinal) Indication(s): Assistance in needle guidance and placement for procedures requiring needle placement in or near specific anatomical locations not easily accessible without such assistance. Exposure Time: Please see nurses notes. Contrast:  Before injecting any contrast, we confirmed that the patient did not have an allergy to iodine, shellfish, or radiological contrast. Once satisfactory needle placement was completed at the desired level, radiological contrast was injected. Injection was conducted under continuous fluoroscopic guidance. Injection of contrast accomplished without complications. See chart for type and volume of contrast used. Fluoroscopic Guidance: I was personally present in the fluoroscopy suite, where the patient was placed in position for the procedure, over the fluoroscopy-compatible table. Fluoroscopy was manipulated, using "Tunnel Vision Technique", to obtain the best possible view of the target area, on the affected side. Parallax error was corrected before commencing the procedure. A "direction-depth-direction" technique was used to introduce the needle under continuous pulsed fluoroscopic guidance. Once the target was reached, antero-posterior, oblique, and lateral fluoroscopic projection views were taken to confirm needle placement in all planes. Permanently recorded images stored by scanning into EMR. Interpretation: Intraoperative imaging interpretation by performing Physician. Adequate needle placement confirmed in AP & Oblique Views. Appropriate spread of contrast to desired area. No evidence of afferent or efferent intravascular uptake. No intrathecal or  subarachnoid spread observed. Permanent images scanned into the patient's record.  Antibiotic Prophylaxis:  Indication(s): No indications identified. Type:  Antibiotics Given (last 72 hours)    None       Post-operative Assessment:  Complications: No immediate post-treatment complications observed by team, or reported by patient. Disposition: The patient tolerated the entire procedure well. A repeat set of vitals were taken after the procedure and the patient was kept under observation following institutional policy, for this type of procedure. Post-procedural neurological assessment was performed, showing return to baseline, prior to discharge. The patient was provided with post-procedure discharge instructions, including a section on how to identify potential problems. Should any problems arise concerning this procedure, the patient was given instructions to immediately contact us, at any time, without hesitation. In any case, we plan to contact the patient by telephone for a follow-up status report regarding this interventional procedure. Comments:  No additional relevant information.  Plan of Care  Discharge to: Discharge home  Medications ordered for procedure: Meds ordered this encounter  Medications  . dexamethasone (DECADRON) injection 10 mg  . iopamidol (ISOVUE-M) 41 % intrathecal injection 10 mL  . lidocaine (PF) (XYLOCAINE) 1 % injection 10 mL  . sodium chloride flush (NS) 0.9 % injection 2 mL  . ropivacaine (PF) 2 mg/ml (0.2%) (NAROPIN) epidural 2 mL  . ropivacaine (PF) 2 mg/ml (0.2%) (NAROPIN) 2 MG/ML epidural    GARNER, CYNTHIA: cabinet override  . sodium chloride 0.9 % injection    GARNER, CYNTHIA: cabinet override  . dexamethasone (DECADRON) 10 MG/ML injection    GARNER, CYNTHIA: cabinet override  . lidocaine (PF) (XYLOCAINE) 1 % injection    GARNER, CYNTHIA: cabinet override  . iopamidol (ISOVUE-M) 41 % intrathecal injection    GARNER, CYNTHIA: cabinet override    Medications administered: (For more details, see medical record) We administered ropivacaine (PF) 2 mg/ml (0.2%), sodium chloride, dexamethasone, lidocaine (PF), and iopamidol.  Imaging Ordered: No results found for this or any previous visit. New Prescriptions   No medications on file   Physician-requested Follow-up:  Return in about 2 weeks (around 08/01/2016) for Post-Procedure evaluation.  Future Appointments Date Time Provider [place]  07/18/2016 12:45 PM Milinda Pointer, MD [facility] None  08/19/2016 1:45 PM Milinda Pointer, MD [facility] None  09/26/2016 10:30 AM Milinda Pointer, MD [facility] None   Primary Care Physician: Pcp Not In System Location: [facility]  Facility Note by: Kathlen Brunswick. Dossie Arbour, M.D, DABA, DABAPM, DABPM, DABIPP, FIPP  Disclaimer:  Medicine is not an exact science. The only guarantee in medicine is that nothing is guaranteed. It is important to note that the decision to proceed with this intervention was based on the information collected from the patient. The Data and conclusions were drawn from the patient's questionnaire, the interview, and the physical examination. Because the information was provided in large part by the patient, it cannot be guaranteed that it has not been purposely or unconsciously manipulated. Every effort has been made to obtain as much relevant data as possible for this evaluation. It is important to note that the conclusions that lead to this procedure are derived in large part from the available data. Always take into account that the treatment will also be dependent on availability of resources and existing treatment guidelines, considered by other Pain Management Practitioners as being common knowledge and practice, at the time of the intervention. For Medico-Legal purposes, it is also important to point out that variation in procedural techniques and pharmacological choices are the acceptable norm.  The indications, contraindications, technique, and results of the above procedure should only be interpreted and judged by a Board-Certified Interventional Pain Specialist with extensive familiarity and expertise in the same exact procedure and technique. Attempts at providing opinions without similar or greater experience and expertise than that of the treating physician will be considered as inappropriate and unethical, and shall result in a formal complaint to the state medical board and applicable specialty societies.  Instructions provided at this appointment: Patient Instructions  Pain Management Discharge Instructions  General Discharge Instructions :  If you need to reach your doctor call: Monday-Friday 8:00 am - 4:00 pm at [phone] or toll free [phone].  After clinic hours [phone] to have operator reach doctor.  Bring all of your medication bottles to all your appointments in the pain clinic.  To cancel or reschedule your appointment with Pain Management please remember to call 24 hours in advance to avoid a fee.  Refer to the educational materials which you have been given on: General Risks, I had my Procedure. Discharge Instructions, Post Sedation.  Post Procedure Instructions:  The drugs you were given will stay in your system until tomorrow, so for the next 24 hours you should not drive, make any legal decisions or drink any alcoholic beverages.  You may eat anything you prefer, but it is better to start with liquids then soups and crackers, and gradually work up to solid foods.  Please notify your doctor immediately if you have any unusual bleeding, trouble breathing or pain that is not related to your normal pain.  Depending on the type of procedure that was done, some parts of your body may feel week and/or numb.  This usually clears up by tonight or the next day.  Walk with the use of an assistive device or accompanied by an adult for the 24 hours.  You  may use ice on the affected area for the first 24 hours.  Put ice in a Ziploc bag and cover with a towel and place against area 15 minutes on 15 minutes off.  You may switch to heat after 24 hours.Epidural Steroid Injection An epidural steroid injection is given to relieve pain in your neck, back, or legs that is caused by the irritation or swelling of a nerve root. This procedure involves injecting a steroid and numbing medicine (anesthetic) into the epidural space. The epidural space  is the space between the outer covering of your spinal cord and the bones that form your backbone (vertebra).  LET [facility] CARE PROVIDER KNOW ABOUT:   Any allergies you have.  All medicines you are taking, including vitamins, herbs, eye drops, creams, and over-the-counter medicines such as aspirin.  Previous problems you or members of your family have had with the use of anesthetics.  Any blood disorders or blood clotting disorders you have.  Previous surgeries you have had.  Medical conditions you have. RISKS AND COMPLICATIONS Generally, this is a safe procedure. However, as with any procedure, complications can occur. Possible complications of epidural steroid injection include:  Headache.  Bleeding.  Infection.  Allergic reaction to the medicines.  Damage to your nerves. The response to this procedure depends on the underlying cause of the pain and its duration. People who have long-term (chronic) pain are less likely to benefit from epidural steroids than are those people whose pain comes on strong and suddenly. BEFORE THE PROCEDURE   Ask your health care provider about changing or stopping your regular medicines. You may be advised to stop taking blood-thinning medicines a few days before the procedure.  You may be given medicines to reduce anxiety.  Arrange for someone to take you home after the procedure. PROCEDURE   You will remain awake during the procedure. You may receive medicine to  make you relaxed.  You will be asked to lie on your stomach.  The injection site will be cleaned.  The injection site will be numbed with a medicine (local anesthetic).  A needle will be injected through your skin into the epidural space.  Your health care provider will use an X-ray machine to ensure that the steroid is delivered closest to the affected nerve. You may have minimal discomfort at this time.  Once the needle is in the right position, the local anesthetic and the steroid will be injected into the epidural space.  The needle will then be removed and a bandage will be applied to the injection site. AFTER THE PROCEDURE   You may be monitored for a short time before you go home.  You may feel weakness or numbness in your arm or leg, which disappears within hours.  You may be allowed to eat, drink, and take your regular medicine.  You may have soreness at the site of the injection.   This information is not intended to replace advice given to you by your health care provider. Make sure you discuss any questions you have with your health care provider.   Document Released: 01/07/2008 Document Revised: 06/02/2013 Document Reviewed: 03/19/2013 Elsevier Interactive Patient Education [employer].

## 2016-07-18 NOTE — Op Note (Signed)
Safety precautions to be maintained throughout the outpatient stay will include: orient to surroundings, keep bed in low position, maintain call bell within reach at all times, provide assistance with transfer out of bed and ambulation.

## 2016-07-18 NOTE — Patient Instructions (Signed)
Pain Management Discharge Instructions  General Discharge Instructions :  If you need to reach your doctor call: Monday-Friday 8:00 am - 4:00 pm at [phone] or toll free [phone].  After clinic hours [phone] to have operator reach doctor.  Bring all of your medication bottles to all your appointments in the pain clinic.  To cancel or reschedule your appointment with Pain Management please remember to call 24 hours in advance to avoid a fee.  Refer to the educational materials which you have been given on: General Risks, I had my Procedure. Discharge Instructions, Post Sedation.  Post Procedure Instructions:  The drugs you were given will stay in your system until tomorrow, so for the next 24 hours you should not drive, make any legal decisions or drink any alcoholic beverages.  You may eat anything you prefer, but it is better to start with liquids then soups and crackers, and gradually work up to solid foods.  Please notify your doctor immediately if you have any unusual bleeding, trouble breathing or pain that is not related to your normal pain.  Depending on the type of procedure that was done, some parts of your body may feel week and/or numb.  This usually clears up by tonight or the next day.  Walk with the use of an assistive device or accompanied by an adult for the 24 hours.  You may use ice on the affected area for the first 24 hours.  Put ice in a Ziploc bag and cover with a towel and place against area 15 minutes on 15 minutes off.  You may switch to heat after 24 hours.Epidural Steroid Injection An epidural steroid injection is given to relieve pain in your neck, back, or legs that is caused by the irritation or swelling of a nerve root. This procedure involves injecting a steroid and numbing medicine (anesthetic) into the epidural space. The epidural space is the space between the outer covering of your spinal cord and the bones that form your backbone  (vertebra).  LET YOUR HEALTH CARE PROVIDER KNOW ABOUT:   Any allergies you have.  All medicines you are taking, including vitamins, herbs, eye drops, creams, and over-the-counter medicines such as aspirin.  Previous problems you or members of your family have had with the use of anesthetics.  Any blood disorders or blood clotting disorders you have.  Previous surgeries you have had.  Medical conditions you have. RISKS AND COMPLICATIONS Generally, this is a safe procedure. However, as with any procedure, complications can occur. Possible complications of epidural steroid injection include:  Headache.  Bleeding.  Infection.  Allergic reaction to the medicines.  Damage to your nerves. The response to this procedure depends on the underlying cause of the pain and its duration. People who have long-term (chronic) pain are less likely to benefit from epidural steroids than are those people whose pain comes on strong and suddenly. BEFORE THE PROCEDURE   Ask your health care provider about changing or stopping your regular medicines. You may be advised to stop taking blood-thinning medicines a few days before the procedure.  You may be given medicines to reduce anxiety.  Arrange for someone to take you home after the procedure. PROCEDURE   You will remain awake during the procedure. You may receive medicine to make you relaxed.  You will be asked to lie on your stomach.  The injection site will be cleaned.  The injection site will be numbed with a medicine (local anesthetic).  A needle will be   injected through your skin into the epidural space.  Your health care provider will use an X-ray machine to ensure that the steroid is delivered closest to the affected nerve. You may have minimal discomfort at this time.  Once the needle is in the right position, the local anesthetic and the steroid will be injected into the epidural space.  The needle will then be removed and a  bandage will be applied to the injection site. AFTER THE PROCEDURE   You may be monitored for a short time before you go home.  You may feel weakness or numbness in your arm or leg, which disappears within hours.  You may be allowed to eat, drink, and take your regular medicine.  You may have soreness at the site of the injection.   This information is not intended to replace advice given to you by your health care provider. Make sure you discuss any questions you have with your health care provider.   Document Released: 01/07/2008 Document Revised: 06/02/2013 Document Reviewed: 03/19/2013 Elsevier Interactive Patient Education [employer].

## 2016-07-19 ENCOUNTER — Telehealth: Payer: Self-pay

## 2016-07-19 NOTE — Telephone Encounter (Signed)
Ost procedure phone call.  Patient states he is doing good.

## 2016-08-19 ENCOUNTER — Encounter: Payer: Self-pay | Admitting: Pain Medicine

## 2016-08-19 ENCOUNTER — Ambulatory Visit: Payer: Medicare Other | Attending: Pain Medicine | Admitting: Pain Medicine

## 2016-08-19 VITALS — BP 166/73 | HR 66 | Temp 98.3°F | Resp 16 | Ht 69.0 in | Wt 156.0 lb

## 2016-08-19 DIAGNOSIS — M48061 Spinal stenosis, lumbar region without neurogenic claudication: Secondary | ICD-10-CM | POA: Insufficient documentation

## 2016-08-19 DIAGNOSIS — M5414 Radiculopathy, thoracic region: Secondary | ICD-10-CM

## 2016-08-19 DIAGNOSIS — Z7982 Long term (current) use of aspirin: Secondary | ICD-10-CM | POA: Insufficient documentation

## 2016-08-19 DIAGNOSIS — F17200 Nicotine dependence, unspecified, uncomplicated: Secondary | ICD-10-CM | POA: Diagnosis not present

## 2016-08-19 DIAGNOSIS — Z79891 Long term (current) use of opiate analgesic: Secondary | ICD-10-CM | POA: Insufficient documentation

## 2016-08-19 DIAGNOSIS — M5412 Radiculopathy, cervical region: Secondary | ICD-10-CM | POA: Diagnosis not present

## 2016-08-19 DIAGNOSIS — Z888 Allergy status to other drugs, medicaments and biological substances status: Secondary | ICD-10-CM | POA: Insufficient documentation

## 2016-08-19 DIAGNOSIS — I70209 Unspecified atherosclerosis of native arteries of extremities, unspecified extremity: Secondary | ICD-10-CM | POA: Insufficient documentation

## 2016-08-19 DIAGNOSIS — Z902 Acquired absence of lung [part of]: Secondary | ICD-10-CM | POA: Diagnosis not present

## 2016-08-19 DIAGNOSIS — F17210 Nicotine dependence, cigarettes, uncomplicated: Secondary | ICD-10-CM | POA: Diagnosis not present

## 2016-08-19 DIAGNOSIS — E785 Hyperlipidemia, unspecified: Secondary | ICD-10-CM | POA: Diagnosis not present

## 2016-08-19 DIAGNOSIS — M546 Pain in thoracic spine: Secondary | ICD-10-CM | POA: Diagnosis not present

## 2016-08-19 DIAGNOSIS — F419 Anxiety disorder, unspecified: Secondary | ICD-10-CM | POA: Insufficient documentation

## 2016-08-19 DIAGNOSIS — Z7984 Long term (current) use of oral hypoglycemic drugs: Secondary | ICD-10-CM | POA: Diagnosis not present

## 2016-08-19 DIAGNOSIS — J449 Chronic obstructive pulmonary disease, unspecified: Secondary | ICD-10-CM | POA: Diagnosis not present

## 2016-08-19 DIAGNOSIS — I10 Essential (primary) hypertension: Secondary | ICD-10-CM | POA: Diagnosis not present

## 2016-08-19 DIAGNOSIS — G894 Chronic pain syndrome: Secondary | ICD-10-CM | POA: Diagnosis not present

## 2016-08-19 NOTE — Patient Instructions (Signed)
Epidural Steroid Injection Patient Information  Description: The epidural space surrounds the nerves as they exit the spinal cord.  In some patients, the nerves can be compressed and inflamed by a bulging disc or a tight spinal canal (spinal stenosis).  By injecting steroids into the epidural space, we can bring irritated nerves into direct contact with a potentially helpful medication.  These steroids act directly on the irritated nerves and can reduce swelling and inflammation which often leads to decreased pain.  Epidural steroids may be injected anywhere along the spine and from the neck to the low back depending upon the location of your pain.   After numbing the skin with local anesthetic (like Novocaine), a small needle is passed into the epidural space slowly.  You may experience a sensation of pressure while this is being done.  The entire block usually last less than 10 minutes.  Conditions which may be treated by epidural steroids:   Low back and leg pain  Neck and arm pain  Spinal stenosis  Post-laminectomy syndrome  Herpes zoster (shingles) pain  Pain from compression fractures  Preparation for the injection:  1. Do not eat any solid food or dairy products within 8 hours of your appointment.  2. You may drink clear liquids up to 3 hours before appointment.  Clear liquids include water, black coffee, juice or soda.  No milk or cream please. 3. You may take your regular medication, including pain medications, with a sip of water before your appointment  Diabetics should hold regular insulin (if taken separately) and take 1/2 normal NPH dos the morning of the procedure.  Carry some sugar containing items with you to your appointment. 4. A driver must accompany you and be prepared to drive you home after your procedure.  5. Bring all your current medications with your. 6. An IV may be inserted and sedation may be given at the discretion of the physician.   7. A blood pressure  cuff, EKG and other monitors will often be applied during the procedure.  Some patients may need to have extra oxygen administered for a short period. 8. You will be asked to provide medical information, including your allergies, prior to the procedure.  We must know immediately if you are taking blood thinners (like Coumadin/Warfarin)  Or if you are allergic to IV iodine contrast (dye). We must know if you could possible be pregnant.  Possible side-effects:  Bleeding from needle site  Infection (rare, may require surgery)  Nerve injury (rare)  Numbness & tingling (temporary)  Difficulty urinating (rare, temporary)  Spinal headache ( a headache worse with upright posture)  Light -headedness (temporary)  Pain at injection site (several days)  Decreased blood pressure (temporary)  Weakness in arm/leg (temporary)  Pressure sensation in back/neck (temporary)  Call if you experience:  Fever/chills associated with headache or increased back/neck pain.  Headache worsened by an upright position.  New onset weakness or numbness of an extremity below the injection site  Hives or difficulty breathing (go to the emergency room)  Inflammation or drainage at the infection site  Severe back/neck pain  Any new symptoms which are concerning to you  Please note:  Although the local anesthetic injected can often make your back or neck feel good for several hours after the injection, the pain will likely return.  It takes 3-7 days for steroids to work in the epidural space.  You may not notice any pain relief for at least that one week.    If effective, we will often do a series of three injections spaced 3-6 weeks apart to maximally decrease your pain.  After the initial series, we generally will wait several months before considering a repeat injection of the same type.  If you have any questions, please call [phone] [facility] R[facility] AND  COMPLICATIONS  What are the risk, side effects and possible complications? Generally speaking, most procedures are safe.  However, with any procedure there are risks, side effects, and the possibility of complications.  The risks and complications are dependent upon the sites that are lesioned, or the type of nerve block to be performed.  The closer the procedure is to the spine, the more serious the risks are.  Great care is taken when placing the radio frequency needles, block needles or lesioning probes, but sometimes complications can occur. 1. Infection: Any time there is an injection through the skin, there is a risk of infection.  This is why sterile conditions are used for these blocks.  There are four possible types of infection. 1. Localized skin infection. 2. Central Nervous System Infection-This can be in the form of Meningitis, which can be deadly. 3. Epidural Infections-This can be in the form of an epidural abscess, which can cause pressure inside of the spine, causing compression of the spinal cord with subsequent paralysis. This would require an emergency surgery to decompress, and there are no guarantees that the patient would recover from the paralysis. 4. Discitis-This is an infection of the intervertebral discs.  It occurs in about 1% of discography procedures.  It is difficult to treat and it may lead to surgery.        2. Pain: the needles have to go through skin and soft tissues, will cause soreness.       3. Damage to internal structures:  The nerves to be lesioned may be near blood vessels or    other nerves which can be potentially damaged.       4. Bleeding: Bleeding is more common if the patient is taking blood thinners such as  aspirin, Coumadin, Ticiid, Plavix, etc., or if he/she have some genetic predisposition  such as hemophilia. Bleeding into the spinal canal can cause compression of the spinal  cord with subsequent paralysis.  This would require an emergency surgery  to  decompress and there are no guarantees that the patient would recover from the  paralysis.       5. Pneumothorax:  Puncturing of a lung is a possibility, every time a needle is introduced in  the area of the chest or upper back.  Pneumothorax refers to free air around the  collapsed lung(s), inside of the thoracic cavity (chest cavity).  Another two possible  complications related to a similar event would include: Hemothorax and Chylothorax.   These are variations of the Pneumothorax, where instead of air around the collapsed  lung(s), you may have blood or chyle, respectively.       6. Spinal headaches: They may occur with any procedures in the area of the spine.       7. Persistent CSF (Cerebro-Spinal Fluid) leakage: This is a rare problem, but may occur  with prolonged intrathecal or epidural catheters either due to the formation of a fistulous  track or a dural tear.       8. Nerve damage: By working so close to the spinal cord, there is always a possibility of  nerve damage, which could be   as serious as a permanent spinal cord injury with  paralysis.       9. Death:  Although rare, severe deadly allergic reactions known as "Anaphylactic  reaction" can occur to any of the medications used.      10. Worsening of the symptoms:  We can always make thing worse.  What are the chances of something like this happening? Chances of any of this occuring are extremely low.  By statistics, you have more of a chance of getting killed in a motor vehicle accident: while driving to the hospital than any of the above occurring .  Nevertheless, you should be aware that they are possibilities.  In general, it is similar to taking a shower.  Everybody knows that you can slip, hit your head and get killed.  Does that mean that you should not shower again?  Nevertheless always keep in mind that statistics do not mean anything if you happen to be on the wrong side of them.  Even if a procedure has a 1 (one) in a 1,000,000  (million) chance of going wrong, it you happen to be that one..Also, keep in mind that by statistics, you have more of a chance of having something go wrong when taking medications.  Who should not have this procedure? If you are on a blood thinning medication (e.g. Coumadin, Plavix, see list of "Blood Thinners"), or if you have an active infection going on, you should not have the procedure.  If you are taking any blood thinners, please inform your physician.  How should I prepare for this procedure?  Do not eat or drink anything at least six hours prior to the procedure.  Bring a driver with you .  It cannot be a taxi.  Come accompanied by an adult that can drive you back, and that is strong enough to help you if your legs get weak or numb from the local anesthetic.  Take all of your medicines the morning of the procedure with just enough water to swallow them.  If you have diabetes, make sure that you are scheduled to have your procedure done first thing in the morning, whenever possible.  If you have diabetes, take only half of your insulin dose and notify our nurse that you have done so as soon as you arrive at the clinic.  If you are diabetic, but only take blood sugar pills (oral hypoglycemic), then do not take them on the morning of your procedure.  You may take them after you have had the procedure.  Do not take aspirin or any aspirin-containing medications, at least eleven (11) days prior to the procedure.  They may prolong bleeding.  Wear loose fitting clothing that may be easy to take off and that you would not mind if it got stained with Betadine or blood.  Do not wear any jewelry or perfume  Remove any nail coloring.  It will interfere with some of our monitoring equipment.  NOTE: Remember that this is not meant to be interpreted as a complete list of all possible complications.  Unforeseen problems may occur.  BLOOD THINNERS The following drugs contain aspirin or other  products, which can cause increased bleeding during surgery and should not be taken for 2 weeks prior to and 1 week after surgery.  If you should need take something for relief of minor pain, you may take acetaminophen which is found in Tylenol,m Datril, Anacin-3 and Panadol. It is not blood thinner. The products listed below   are.  Do not take any of the products listed below in addition to any listed on your instruction sheet.  A.P.C or A.P.C with Codeine Codeine Phosphate Capsules #3 Ibuprofen Ridaura  ABC compound Congesprin Imuran rimadil  Advil Cope Indocin Robaxisal  Alka-Seltzer Effervescent Pain Reliever and Antacid Coricidin or Coricidin-D  Indomethacin Rufen  Alka-Seltzer plus Cold Medicine Cosprin Ketoprofen S-A-C Tablets  Anacin Analgesic Tablets or Capsules Coumadin Korlgesic Salflex  Anacin Extra Strength Analgesic tablets or capsules CP-2 Tablets Lanoril Salicylate  Anaprox Cuprimine Capsules Levenox Salocol  Anexsia-D Dalteparin Magan Salsalate  Anodynos Darvon compound Magnesium Salicylate Sine-off  Ansaid Dasin Capsules Magsal Sodium Salicylate  Anturane Depen Capsules Marnal Soma  APF Arthritis pain formula Dewitt's Pills Measurin Stanback  Argesic Dia-Gesic Meclofenamic Sulfinpyrazone  Arthritis Bayer Timed Release Aspirin Diclofenac Meclomen Sulindac  Arthritis pain formula Anacin Dicumarol Medipren Supac  Analgesic (Safety coated) Arthralgen Diffunasal Mefanamic Suprofen  Arthritis Strength Bufferin Dihydrocodeine Mepro Compound Suprol  Arthropan liquid Dopirydamole Methcarbomol with Aspirin Synalgos  ASA tablets/Enseals Disalcid Micrainin Tagament  Ascriptin Doan's Midol Talwin  Ascriptin A/D Dolene Mobidin Tanderil  Ascriptin Extra Strength Dolobid Moblgesic Ticlid  Ascriptin with Codeine Doloprin or Doloprin with Codeine Momentum Tolectin  Asperbuf Duoprin Mono-gesic Trendar  Aspergum Duradyne Motrin or Motrin IB Triminicin  Aspirin plain, buffered or enteric  coated Durasal Myochrisine Trigesic  Aspirin Suppositories Easprin Nalfon Trillsate  Aspirin with Codeine Ecotrin Regular or Extra Strength Naprosyn Uracel  Atromid-S Efficin Naproxen Ursinus  Auranofin Capsules Elmiron Neocylate Vanquish  Axotal Emagrin Norgesic Verin  Azathioprine Empirin or Empirin with Codeine Normiflo Vitamin E  Azolid Emprazil Nuprin Voltaren  Bayer Aspirin plain, buffered or children's or timed BC Tablets or powders Encaprin Orgaran Warfarin Sodium  Buff-a-Comp Enoxaparin Orudis Zorpin  Buff-a-Comp with Codeine Equegesic Os-Cal-Gesic   Buffaprin Excedrin plain, buffered or Extra Strength Oxalid   Bufferin Arthritis Strength Feldene Oxphenbutazone   Bufferin plain or Extra Strength Feldene Capsules Oxycodone with Aspirin   Bufferin with Codeine Fenoprofen Fenoprofen Pabalate or Pabalate-SF   Buffets II Flogesic Panagesic   Buffinol plain or Extra Strength Florinal or Florinal with Codeine Panwarfarin   Buf-Tabs Flurbiprofen Penicillamine   Butalbital Compound Four-way cold tablets Penicillin   Butazolidin Fragmin Pepto-Bismol   Carbenicillin Geminisyn Percodan   Carna Arthritis Reliever Geopen Persantine   Carprofen Gold's salt Persistin   Chloramphenicol Goody's Phenylbutazone   Chloromycetin Haltrain Piroxlcam   Clmetidine heparin Plaquenil   Cllnoril Hyco-pap Ponstel   Clofibrate Hydroxy chloroquine Propoxyphen         Before stopping any of these medications, be sure to consult the physician who ordered them.  Some, such as Coumadin (Warfarin) are ordered to prevent or treat serious conditions such as "deep thrombosis", "pumonary embolisms", and other heart problems.  The amount of time that you may need off of the medication may also vary with the medication and the reason for which you were taking it.  If you are taking any of these medications, please make sure you notify your pain physician before you undergo any procedures.

## 2016-08-19 NOTE — Progress Notes (Signed)
Safety precautions to be maintained throughout the outpatient stay will include: orient to surroundings, keep bed in low position, maintain call bell within reach at all times, provide assistance with transfer out of bed and ambulation.

## 2016-08-19 NOTE — Progress Notes (Signed)
Patient's Name: Rick Carter.  MRN: [id number]  Referring Provider: No ref. provider found  DOB: 08-20-1951  PCP: Pcp Not In System  DOS: 08/19/2016  Note by: Nechemia Chiappetta A. Dossie Arbour, MD  Service setting: Ambulatory outpatient  Specialty: Interventional Pain Management  Location: [facility] (AMB) Pain Management Facility    Patient type: Established   Primary Reason(s) for Visit: Encounter for post-procedure evaluation of chronic illness with mild to moderate exacerbation CC: Neck Pain and Back Pain  HPI  Rick Carter is a 65 y.o. year old, male patient, who comes today for a post-procedure evaluation. 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[place]); Atrial fibrillation ([place]); Bipolar affective disorder ([place]); Chronic obstructive pulmonary disease ([place]); Degeneration of intervertebral disc of cervical region; Colon, diverticulosis; Acid reflux; Essential (primary) hypertension; HLD (hyperlipidemia); Breath shortness; Compulsive tobacco user syndrome; Diverticular disease of large intestine; Post-thoracotomy pain syndrome; Encounter for therapeutic drug level monitoring; Long term current use of opiate analgesic; Long term prescription opiate use; Uncomplicated opioid dependence ([place]); Opiate use; Fibromyalgia; Chronic pain syndrome; Lumbar facet syndrome (Location of Primary Source of Pain) (Bilateral) (R>L); History of alcoholism ([place]); Chronic low back pain (Location of Primary Source of Pain) (midline); Lumbar spondylosis; Cervical spondylosis; Failed cervical surgery syndrome; Nicotine dependence; Tobacco abuse; Substance Use Disorder Risk: High; History of right-sided partial pneumonectomy; Atherosclerotic peripheral vascular disease ([place]); Musculoskeletal pain; Neurogenic pain; Neuropathic pain; Opioid-induced constipation (OIC); Osteoarthritis; NSAID induced gastritis; Trigger finger of right hand (middle finger); Thoracic spine pain; Thoracic radicular pain (Right); Chronic lumbar radicular  pain (L5/S1 dermatome) (Location of Secondary source of pain) (Right); Lumbar foraminal stenosis (L5-S1) (Right); Lumbar facet arthropathy (Bilateral) (R>L); Lumbar disc protrusion (L3-4) (Right); Chronic cervical radicular pain (Left); and Tumor of parotid gland on his problem list. His primarily concern today is the Neck Pain and Back Pain  Pain Assessment: Self-Reported Pain Score: 3 /10             Reported level is compatible with observation.       Pain Type: Chronic pain Pain Location: Neck Pain Orientation: Left Pain Descriptors / Indicators: Aching Pain Frequency: Intermittent  Rick Carter comes in today for post-procedure evaluation after the treatment done on 07/18/2016.  Further details on both, my assessment(s), as well as the proposed treatment plan, please see below.  Post-Procedure Assessment  07/18/2016 Procedure: Left cervical epidural steroid injection under fluoroscopic guidance, no sedation. Influential Factors: BMI: 23.04 kg/m Intra-procedural challenges: None observed Assessment challenges: None detected         Post-procedural side-effects, adverse reactions, or complications: None reported Reported issues: None  Sedation: No sedation used. When no sedatives are used, the analgesic levels obtained are directly associated to the effectiveness of the local anesthetics. However, when sedation is provided, the level of analgesia obtained during the initial 1 hour following the intervention, is believed to be the result of a combination of factors. These factors may include, but are not limited to: 1. The effectiveness of the local anesthetics used. 2. The effects of the analgesic(s) and/or anxiolytic(s) used. 3. The degree of discomfort experienced by the patient at the time of the procedure. 4. The patients ability and reliability in recalling and recording the events. 5. The presence and influence of possible secondary gains and/or psychosocial factors. Reported  result: Relief experienced during the 1st hour after the procedure: 100 % (Ultra-Short Term Relief) Interpretative annotation: No Analgesic or Anxiolytic given, therefore benefits are completely due to Local Anesthetics.  Effects of local anesthetic: The analgesic effects attained during this period are directly associated to the localized infiltration of local anesthetics and therefore cary significant diagnostic value as to the etiological location, or anatomical origin, of the pain. Expected duration of relief is directly dependent on the pharmacodynamics of the local anesthetic used. Long-acting (4-6 hours) anesthetics used.  Reported result: Relief during the next 4 to 6 hour after the procedure: 100 % (Short-Term Relief) Interpretative annotation: Complete relief would suggest area to be the source of the pain.          Long-term benefit: Defined as the period of time past the expected duration of local anesthetics. With the possible exception of prolonged sympathetic blockade from the local anesthetics, benefits during this period are typically attributed to, or associated with, other factors such as analgesic sensory neuropraxia, antiinflammatory effects, or beneficial biochemical changes provided by agents other than the local anesthetics Reported result: Extended relief following procedure: 75 % (ongoing) (Long-Term Relief) Interpretative annotation: Good relief. This could suggest inflammation to be a significant component in the etiology to the pain.          Current benefits: Defined as persistent relief that continues at this point in time.   Reported results: Treated area: 75 %       Interpretative annotation: Ongoing benefits would suggest effective therapeutic approach  Interpretation: Results would suggest a successful therapeutic intervention.          Laboratory Chemistry  Inflammation Markers Lab Results  Component Value Date   ESRSEDRATE 1 10/10/2015   CRP 0.8  10/10/2015   Renal Function Lab Results  Component Value Date   BUN 13 10/10/2015   CREATININE 0.82 10/10/2015   GFRAA >60 10/10/2015   GFRNONAA >60 10/10/2015   Hepatic Function Lab Results  Component Value Date   AST 21 10/10/2015   ALT 16 (L) 10/10/2015   ALBUMIN 4.5 10/10/2015   Electrolytes Lab Results  Component Value Date   NA 136 10/10/2015   K 3.5 10/10/2015   CL 98 (L) 10/10/2015   CALCIUM 9.8 10/10/2015   MG 2.3 10/10/2015   Pain Modulating Vitamins Lab Results  Component Value Date   25OHVITD1 51 10/10/2015   25OHVITD2 <1.0 10/10/2015   25OHVITD3 50 10/10/2015   Coagulation Parameters No results found for: INR, LABPROT, APTT, PLT Cardiovascular No results found for: BNP, HGB, HCT Note: Lab results reviewed.  Recent Diagnostic Imaging Review  Dg C-arm 1-60 Min-no Report  Result Date: 07/18/2016 CLINICAL DATA: Assistance in needle guidance and placement for procedures requiring needle placement in or near specific anatomical locations not easily accessible without such assistance. C-ARM 1-60 MINUTES Fluoroscopy was utilized by the requesting physician.  No radiographic interpretation.   Note: Imaging results reviewed.  Meds  The patient has a current medication list which includes the following prescription(s): aclidinium bromide, advair diskus, albuterol, aspirin ec, calcium carb-cholecalciferol, cholecalciferol, diphenhydramine, docusate sodium, duloxetine, fluticasone-salmeterol, hydrochlorothiazide, hydroxyzine, ibuprofen, loratadine, metformin, metformin, multiple vitamins-minerals, oxycodone-acetaminophen, oxycodone-acetaminophen, pantoprazole, pregabalin, sodium chloride hypertonic, trazodone, tudorza pressair, and oxycodone-acetaminophen.  Current Outpatient Prescriptions on File Prior to Visit  Medication Sig  . Aclidinium Bromide (TUDORZA PRESSAIR IN) Inhale 40 mcg into the lungs 2 (two) times daily.  Marland Kitchen ADVAIR DISKUS 250-50 MCG/DOSE AEPB INHALE  1 PUFF 2 (TWO) TIMES A DAY.  Marland Kitchen albuterol (PROVENTIL HFA;VENTOLIN HFA) 108 (90 Base) MCG/ACT inhaler Inhale 2 puffs into the lungs every 4 (four) hours as needed for wheezing or shortness of breath.   Marland Kitchen  aspirin EC 81 MG tablet Take 81 mg by mouth daily.  . Calcium Carb-Cholecalciferol 600-800 MG-UNIT TABS TAKE 1 TABLET BY MOUTH 2 TIMES A DAY  . cholecalciferol (VITAMIN D) 1000 UNITS tablet Take 2,000 Units by mouth daily.  . diphenhydrAMINE (BENADRYL) 25 mg capsule Take 25 mg by mouth 2 (two) times daily.   Marland Kitchen docusate sodium (COLACE) 100 MG capsule Take 1 capsule (100 mg total) by mouth daily.  . DULoxetine (CYMBALTA) 60 MG capsule Take 60 mg by mouth 2 (two) times daily.   . Fluticasone-Salmeterol (ADVAIR) 500-50 MCG/DOSE AEPB Inhale 1 puff into the lungs 2 (two) times daily.  . hydrochlorothiazide (MICROZIDE) 12.5 MG capsule TAKE ONE CAPSULE BY MOUTH FOR HIGH BLOOD PRESSURE  . hydrOXYzine (ATARAX/VISTARIL) 25 MG tablet Take 25 mg by mouth as needed.  Marland Kitchen ibuprofen (ADVIL,MOTRIN) 800 MG tablet Take 1 tablet (800 mg total) by mouth every 8 (eight) hours as needed for moderate pain.  Marland Kitchen loratadine (CLARITIN) 10 MG tablet Take 10 mg by mouth.  . metFORMIN (GLUCOPHAGE) 500 MG tablet Take 500 mg by mouth 2 (two) times daily with a meal.  . Multiple Vitamins-Minerals (PRESERVISION AREDS 2 PO) Take 1 tablet by mouth 2 (two) times daily.  Marland Kitchen oxyCODONE-acetaminophen (PERCOCET/ROXICET) 5-325 MG tablet Take 1 tablet by mouth every 6 (six) hours as needed for severe pain.  Derrill Memo ON 08/31/2016] oxyCODONE-acetaminophen (PERCOCET/ROXICET) 5-325 MG tablet Take 1 tablet by mouth every 6 (six) hours as needed for severe pain.  . pantoprazole (PROTONIX) 40 MG tablet Take 40 mg by mouth.  . pregabalin (LYRICA) 150 MG capsule Take 1 capsule (150 mg total) by mouth 2 (two) times daily.  . traZODone (DESYREL) 50 MG tablet Take 50 mg by mouth at bedtime. Reported on 02/20/2016  . oxyCODONE-acetaminophen (PERCOCET/ROXICET)  5-325 MG tablet Take 1 tablet by mouth every 6 (six) hours as needed for severe pain.   No current facility-administered medications on file prior to visit.    ROS  Constitutional: Denies any fever or chills Gastrointestinal: No reported hemesis, hematochezia, vomiting, or acute GI distress Musculoskeletal: Denies any acute onset joint swelling, redness, loss of ROM, or weakness Neurological: No reported episodes of acute onset apraxia, aphasia, dysarthria, agnosia, amnesia, paralysis, loss of coordination, or loss of consciousness  Allergies  Mr. Cheever is allergic to atorvastatin and varenicline.  PFSH  Drug: Mr. Geisen  has no drug history on file. Alcohol:  reports that he does not drink alcohol. Tobacco:  reports that he has been smoking Cigarettes.  He has a 51.00 pack-year smoking history. He has never used smokeless tobacco. Medical:  has a past medical history of Anxiety; COPD (chronic obstructive pulmonary disease) ([place]); Depression; Hernia of abdominal wall; History of alcoholism ([place]) (08/08/2015); History of pneumonectomy (08/08/2015); Hypertension; and Tumor cells. Family: family history includes Alcohol abuse in his father; Kidney disease in his mother.  Past Surgical History:  Procedure Laterality Date  . LUNG REMOVAL, PARTIAL    . NECK SURGERY     plate in neck- 11 years ago  . TONSILLECTOMY     Constitutional Exam  General appearance: Well nourished, well developed, and well hydrated. In no apparent acute distress Vitals:   08/19/16 1322  BP: (!) 166/73  Pulse: 66  Resp: 16  Temp: 98.3 F (36.8 C)  SpO2: 97%  Weight: 156 lb (70.8 kg)  Height: _0  (1.753 m)   BMI Assessment: Estimated body mass index is 23.04 kg/m as calculated from the following:  Height as of this encounter: _0  (1.753 m).   Weight as of this encounter: 156 lb (70.8 kg).  BMI interpretation table: BMI level Category Range association with higher incidence of chronic pain  <18  kg/m2 Underweight   18.5-24.9 kg/m2 Ideal body weight   25-29.9 kg/m2 Overweight Increased incidence by 20%  30-34.9 kg/m2 Obese (Class I) Increased incidence by 68%  35-39.9 kg/m2 Severe obesity (Class II) Increased incidence by 136%  >40 kg/m2 Extreme obesity (Class III) Increased incidence by 254%   BMI Readings from Last 4 Encounters:  08/19/16 23.04 kg/m  07/18/16 22.74 kg/m  06/24/16 22.59 kg/m  05/13/16 22.74 kg/m   Wt Readings from Last 4 Encounters:  08/19/16 156 lb (70.8 kg)  07/18/16 154 lb (69.9 kg)  06/24/16 153 lb (69.4 kg)  05/13/16 154 lb (69.9 kg)  Psych/Mental status: Alert, oriented x 3 (person, place, & time) Eyes: PERLA Respiratory: No evidence of acute respiratory distress  Cervical Spine Exam  Inspection: No masses, redness, or swelling Alignment: Symmetrical Functional ROM: Unrestricted ROM Stability: No instability detected Muscle strength & Tone: Functionally intact Sensory: Unimpaired Palpation: Non-contributory  Upper Extremity (UE) Exam    Side: Right upper extremity  Side: Left upper extremity  Inspection: No masses, redness, swelling, or asymmetry  Inspection: No masses, redness, swelling, or asymmetry  Functional ROM: Unrestricted ROM         Functional ROM: Unrestricted ROM          Muscle strength & Tone: Functionally intact  Muscle strength & Tone: Functionally intact  Sensory: Unimpaired  Sensory: Unimpaired  Palpation: Non-contributory  Palpation: Non-contributory   Thoracic Spine Exam  Inspection: No masses, redness, or swelling Alignment: Symmetrical Functional ROM: Unrestricted ROM Stability: No instability detected Sensory: Unimpaired Muscle strength & Tone: Functionally intact Palpation: Non-contributory  Lumbar Spine Exam  Inspection: No masses, redness, or swelling Alignment: Symmetrical Functional ROM: Unrestricted ROM Stability: No instability detected Muscle strength & Tone: Functionally intact Sensory:  Unimpaired Palpation: Non-contributory Provocative Tests: Lumbar Hyperextension and rotation test: evaluation deferred today       Patrick's Maneuver: evaluation deferred today              Gait & Posture Assessment  Ambulation: Unassisted Gait: Relatively normal for age and body habitus Posture: WNL   Lower Extremity Exam    Side: Right lower extremity  Side: Left lower extremity  Inspection: No masses, redness, swelling, or asymmetry  Inspection: No masses, redness, swelling, or asymmetry  Functional ROM: Unrestricted ROM          Functional ROM: Unrestricted ROM          Muscle strength & Tone: Functionally intact  Muscle strength & Tone: Functionally intact  Sensory: Unimpaired  Sensory: Unimpaired  Palpation: Non-contributory  Palpation: Non-contributory   Assessment  Primary Diagnosis & Pertinent Problem List: The primary encounter diagnosis was Thoracic radicular pain (Right). Diagnoses of Thoracic spine pain and Chronic pain syndrome were also pertinent to this visit.  Visit Diagnosis: 1. Thoracic radicular pain (Right)   2. Thoracic spine pain   3. Chronic pain syndrome    Plan of Care  Pharmacotherapy (Medications Ordered): No orders of the defined types were placed in this encounter.  New Prescriptions   No medications on file   Medications administered today: Mr. Deleeuw had no medications administered during this visit. Lab-work, procedure(s), and/or referral(s): Orders Placed This Encounter  Procedures  . Thoracic Epidural Injection   Imaging and/or referral(s): None  Interventional  therapies: Planned, scheduled, and/or pending:   Right thoracic epidural steroid injection under fluoroscopic guidance, no sedation.    Considering:   Right thoracic epidural steroid injection under fluoroscopic guidance, no sedation.    Palliative PRN treatment(s):   Cervical epidural steroid injections.  Lumbar epidural steroid injection.  Lumbar facet blocks.     Provider-requested follow-up: Return for Schedule Procedure, (ASAA).  Future Appointments Date Time Provider [place]  08/26/2016 9:15 AM Milinda Pointer, MD [facility] None  09/26/2016 10:30 AM Milinda Pointer, MD [facility] [place] None   Primary Care Physician: Pcp Not In System Location: [facility] Outpatient Pain Management Facility Note by: Kathlen Brunswick. Dossie Arbour, M.D, DABA, DABAPM, DABPM, DABIPP, FIPP  Pain Score Disclaimer: We use the NRS-11 scale. This is a self-reported, subjective measurement of pain severity with only modest accuracy. It is used primarily to identify changes within a particular patient. It must be understood that outpatient pain scales are significantly less accurate that those used for research, where they can be applied under ideal controlled circumstances with minimal exposure to variables. In reality, the score is likely to be a combination of pain intensity and pain affect, where pain affect describes the degree of emotional arousal or changes in action readiness caused by the sensory experience of pain. Factors such as social and work situation, setting, emotional state, anxiety levels, expectation, and prior pain experience may influence pain perception and show large inter-individual differences that may also be affected by time variables.  Patient instructions provided during this appointment: Patient Instructions   Epidural Steroid Injection Patient Information  Description: The epidural space surrounds the nerves as they exit the spinal cord.  In some patients, the nerves can be compressed and inflamed by a bulging disc or a tight spinal canal (spinal stenosis).  By injecting steroids into the epidural space, we can bring irritated nerves into direct contact with a potentially helpful medication.  These steroids act directly on the irritated nerves and can reduce swelling and inflammation which often leads to decreased pain.  Epidural steroids may be  injected anywhere along the spine and from the neck to the low back depending upon the location of your pain.   After numbing the skin with local anesthetic (like Novocaine), a small needle is passed into the epidural space slowly.  You may experience a sensation of pressure while this is being done.  The entire block usually last less than 10 minutes.  Conditions which may be treated by epidural steroids:   Low back and leg pain  Neck and arm pain  Spinal stenosis  Post-laminectomy syndrome  Herpes zoster (shingles) pain  Pain from compression fractures  Preparation for the injection:  1. Do not eat any solid food or dairy products within 8 hours of your appointment.  2. You may drink clear liquids up to 3 hours before appointment.  Clear liquids include water, black coffee, juice or soda.  No milk or cream please. 3. You may take your regular medication, including pain medications, with a sip of water before your appointment  Diabetics should hold regular insulin (if taken separately) and take 1/2 normal NPH dos the morning of the procedure.  Carry some sugar containing items with you to your appointment. 4. A driver must accompany you and be prepared to drive you home after your procedure.  5. Bring all your current medications with your. 6. An IV may be inserted and sedation may be given at the discretion of the physician.   7. A blood  pressure cuff, EKG and other monitors will often be applied during the procedure.  Some patients may need to have extra oxygen administered for a short period. 8. You will be asked to provide medical information, including your allergies, prior to the procedure.  We must know immediately if you are taking blood thinners (like Coumadin/Warfarin)  Or if you are allergic to IV iodine contrast (dye). We must know if you could possible be pregnant.  Possible side-effects:  Bleeding from needle site  Infection (rare, may require surgery)  Nerve injury  (rare)  Numbness & tingling (temporary)  Difficulty urinating (rare, temporary)  Spinal headache ( a headache worse with upright posture)  Light -headedness (temporary)  Pain at injection site (several days)  Decreased blood pressure (temporary)  Weakness in arm/leg (temporary)  Pressure sensation in back/neck (temporary)  Call if you experience:  Fever/chills associated with headache or increased back/neck pain.  Headache worsened by an upright position.  New onset weakness or numbness of an extremity below the injection site  Hives or difficulty breathing (go to the emergency room)  Inflammation or drainage at the infection site  Severe back/neck pain  Any new symptoms which are concerning to you  Please note:  Although the local anesthetic injected can often make your back or neck feel good for several hours after the injection, the pain will likely return.  It takes 3-7 days for steroids to work in the epidural space.  You may not notice any pain relief for at least that one week.  If effective, we will often do a series of three injections spaced 3-6 weeks apart to maximally decrease your pain.  After the initial series, we generally will wait several months before considering a repeat injection of the same type.  If you have any questions, please call [phone] [place]  What are the risk, side effects and possible complications? Generally speaking, most procedures are safe.  However, with any procedure there are risks, side effects, and the possibility of complications.  The risks and complications are dependent upon the sites that are lesioned, or the type of nerve block to be performed.  The closer the procedure is to the spine, the more serious the risks are.  Great care is taken when placing the radio frequency needles, block needles or lesioning probes, but sometimes complications can  occur. 1. Infection: Any time there is an injection through the skin, there is a risk of infection.  This is why sterile conditions are used for these blocks.  There are four possible types of infection. 1. Localized skin infection. 2. Central Nervous System Infection-This can be in the form of Meningitis, which can be deadly. 3. Epidural Infections-This can be in the form of an epidural abscess, which can cause pressure inside of the spine, causing compression of the spinal cord with subsequent paralysis. This would require an emergency surgery to decompress, and there are no guarantees that the patient would recover from the paralysis. 4. Discitis-This is an infection of the intervertebral discs.  It occurs in about 1% of discography procedures.  It is difficult to treat and it may lead to surgery.        2. Pain: the needles have to go through skin and soft tissues, will cause soreness.       3. Damage to internal structures:  The nerves to be lesioned may be near blood vessels or    other nerves which  can be potentially damaged.       4. Bleeding: Bleeding is more common if the patient is taking blood thinners such as  aspirin, Coumadin, Ticiid, Plavix, etc., or if he/she have some genetic predisposition  such as hemophilia. Bleeding into the spinal canal can cause compression of the spinal  cord with subsequent paralysis.  This would require an emergency surgery to  decompress and there are no guarantees that the patient would recover from the  paralysis.       5. Pneumothorax:  Puncturing of a lung is a possibility, every time a needle is introduced in  the area of the chest or upper back.  Pneumothorax refers to free air around the  collapsed lung(s), inside of the thoracic cavity (chest cavity).  Another two possible  complications related to a similar event would include: Hemothorax and Chylothorax.   These are variations of the Pneumothorax, where instead of air around the collapsed  lung(s),  you may have blood or chyle, respectively.       6. Spinal headaches: They may occur with any procedures in the area of the spine.       7. Persistent CSF (Cerebro-Spinal Fluid) leakage: This is a rare problem, but may occur  with prolonged intrathecal or epidural catheters either due to the formation of a fistulous  track or a dural tear.       8. Nerve damage: By working so close to the spinal cord, there is always a possibility of  nerve damage, which could be as serious as a permanent spinal cord injury with  paralysis.       9. Death:  Although rare, severe deadly allergic reactions known as "Anaphylactic  reaction" can occur to any of the medications used.      10. Worsening of the symptoms:  We can always make thing worse.  What are the chances of something like this happening? Chances of any of this occuring are extremely low.  By statistics, you have more of a chance of getting killed in a motor vehicle accident: while driving to the hospital than any of the above occurring .  Nevertheless, you should be aware that they are possibilities.  In general, it is similar to taking a shower.  Everybody knows that you can slip, hit your head and get killed.  Does that mean that you should not shower again?  Nevertheless always keep in mind that statistics do not mean anything if you happen to be on the wrong side of them.  Even if a procedure has a 1 (one) in a 1,000,000 (million) chance of going wrong, it you happen to be that one..Also, keep in mind that by statistics, you have more of a chance of having something go wrong when taking medications.  Who should not have this procedure? If you are on a blood thinning medication (e.g. Coumadin, Plavix, see list of "Blood Thinners"), or if you have an active infection going on, you should not have the procedure.  If you are taking any blood thinners, please inform your physician.  How should I prepare for this procedure?  Do not eat or drink anything at  least six hours prior to the procedure.  Bring a driver with you .  It cannot be a taxi.  Come accompanied by an adult that can drive you back, and that is strong enough to help you if your legs get weak or numb from the local anesthetic.  Take all of your  medicines the morning of the procedure with just enough water to swallow them.  If you have diabetes, make sure that you are scheduled to have your procedure done first thing in the morning, whenever possible.  If you have diabetes, take only half of your insulin dose and notify our nurse that you have done so as soon as you arrive at the clinic.  If you are diabetic, but only take blood sugar pills (oral hypoglycemic), then do not take them on the morning of your procedure.  You may take them after you have had the procedure.  Do not take aspirin or any aspirin-containing medications, at least eleven (11) days prior to the procedure.  They may prolong bleeding.  Wear loose fitting clothing that may be easy to take off and that you would not mind if it got stained with Betadine or blood.  Do not wear any jewelry or perfume  Remove any nail coloring.  It will interfere with some of our monitoring equipment.  NOTE: Remember that this is not meant to be interpreted as a complete list of all possible complications.  Unforeseen problems may occur.  BLOOD THINNERS The following drugs contain aspirin or other products, which can cause increased bleeding during surgery and should not be taken for 2 weeks prior to and 1 week after surgery.  If you should need take something for relief of minor pain, you may take acetaminophen which is found in Tylenol,m Datril, Anacin-3 and Panadol. It is not blood thinner. The products listed below are.  Do not take any of the products listed below in addition to any listed on your instruction sheet.  A.P.C or A.P.C with Codeine Codeine Phosphate Capsules #3 Ibuprofen Ridaura  ABC compound Congesprin Imuran  rimadil  Advil Cope Indocin Robaxisal  Alka-Seltzer Effervescent Pain Reliever and Antacid Coricidin or Coricidin-D  Indomethacin Rufen  Alka-Seltzer plus Cold Medicine Cosprin Ketoprofen S-A-C Tablets  Anacin Analgesic Tablets or Capsules Coumadin Korlgesic Salflex  Anacin Extra Strength Analgesic tablets or capsules CP-2 Tablets Lanoril Salicylate  Anaprox Cuprimine Capsules Levenox Salocol  Anexsia-D Dalteparin Magan Salsalate  Anodynos Darvon compound Magnesium Salicylate Sine-off  Ansaid Dasin Capsules Magsal Sodium Salicylate  Anturane Depen Capsules Marnal Soma  APF Arthritis pain formula Dewitt's Pills Measurin Stanback  Argesic Dia-Gesic Meclofenamic Sulfinpyrazone  Arthritis Bayer Timed Release Aspirin Diclofenac Meclomen Sulindac  Arthritis pain formula Anacin Dicumarol Medipren Supac  Analgesic (Safety coated) Arthralgen Diffunasal Mefanamic Suprofen  Arthritis Strength Bufferin Dihydrocodeine Mepro Compound Suprol  Arthropan liquid Dopirydamole Methcarbomol with Aspirin Synalgos  ASA tablets/Enseals Disalcid Micrainin Tagament  Ascriptin Doan's Midol Talwin  Ascriptin A/D Dolene Mobidin Tanderil  Ascriptin Extra Strength Dolobid Moblgesic Ticlid  Ascriptin with Codeine Doloprin or Doloprin with Codeine Momentum Tolectin  Asperbuf Duoprin Mono-gesic Trendar  Aspergum Duradyne Motrin or Motrin IB Triminicin  Aspirin plain, buffered or enteric coated Durasal Myochrisine Trigesic  Aspirin Suppositories Easprin Nalfon Trillsate  Aspirin with Codeine Ecotrin Regular or Extra Strength Naprosyn Uracel  Atromid-S Efficin Naproxen Ursinus  Auranofin Capsules Elmiron Neocylate Vanquish  Axotal Emagrin Norgesic Verin  Azathioprine Empirin or Empirin with Codeine Normiflo Vitamin E  Azolid Emprazil Nuprin Voltaren  Bayer Aspirin plain, buffered or children's or timed BC Tablets or powders Encaprin Orgaran Warfarin Sodium  Buff-a-Comp Enoxaparin Orudis Zorpin  Buff-a-Comp with  Codeine Equegesic Os-Cal-Gesic   Buffaprin Excedrin plain, buffered or Extra Strength Oxalid   Bufferin Arthritis Strength Feldene Oxphenbutazone   Bufferin plain or Extra Strength Feldene Capsules Oxycodone with Aspirin  Bufferin with Codeine Fenoprofen Fenoprofen Pabalate or Pabalate-SF   Buffets II Flogesic Panagesic   Buffinol plain or Extra Strength Florinal or Florinal with Codeine Panwarfarin   Buf-Tabs Flurbiprofen Penicillamine   Butalbital Compound Four-way cold tablets Penicillin   Butazolidin Fragmin Pepto-Bismol   Carbenicillin Geminisyn Percodan   Carna Arthritis Reliever Geopen Persantine   Carprofen Gold's salt Persistin   Chloramphenicol Goody's Phenylbutazone   Chloromycetin Haltrain Piroxlcam   Clmetidine heparin Plaquenil   Cllnoril Hyco-pap Ponstel   Clofibrate Hydroxy chloroquine Propoxyphen         Before stopping any of these medications, be sure to consult the physician who ordered them.  Some, such as Coumadin (Warfarin) are ordered to prevent or treat serious conditions such as "deep thrombosis", "pumonary embolisms", and other heart problems.  The amount of time that you may need off of the medication may also vary with the medication and the reason for which you were taking it.  If you are taking any of these medications, please make sure you notify your pain physician before you undergo any procedures.

## 2016-08-26 ENCOUNTER — Ambulatory Visit
Admission: RE | Admit: 2016-08-26 | Discharge: 2016-08-26 | Disposition: A | Discharge status: Home / Self Care | Payer: Medicare Other | Source: Ambulatory Visit | Attending: Pain Medicine | Admitting: Pain Medicine

## 2016-08-26 ENCOUNTER — Ambulatory Visit (HOSPITAL_BASED_OUTPATIENT_CLINIC_OR_DEPARTMENT_OTHER): Payer: Medicare Other | Admitting: Pain Medicine

## 2016-08-26 ENCOUNTER — Encounter: Payer: Self-pay | Admitting: Pain Medicine

## 2016-08-26 VITALS — BP 166/80 | HR 60 | Temp 97.0°F | Resp 16 | Ht 69.0 in | Wt 156.0 lb

## 2016-08-26 DIAGNOSIS — M5414 Radiculopathy, thoracic region: Secondary | ICD-10-CM

## 2016-08-26 DIAGNOSIS — M546 Pain in thoracic spine: Secondary | ICD-10-CM

## 2016-08-26 DIAGNOSIS — G8929 Other chronic pain: Secondary | ICD-10-CM | POA: Diagnosis present

## 2016-08-26 DIAGNOSIS — H3530 Unspecified macular degeneration: Secondary | ICD-10-CM

## 2016-08-26 HISTORY — DX: Unspecified macular degeneration: H35.30

## 2016-08-26 MED ORDER — LIDOCAINE HCL (PF) 1 % IJ SOLN
10.0000 mL | Freq: Once | INTRAMUSCULAR | Status: AC
  Administered 2016-08-26: 10 mL

## 2016-08-26 MED ORDER — IOPAMIDOL (ISOVUE-M 200) INJECTION 41%
10.0000 mL | Freq: Once | INTRAMUSCULAR | Status: AC
  Administered 2016-08-26: 10 mL via EPIDURAL
  Filled 2016-08-26: qty 10

## 2016-08-26 MED ORDER — DEXAMETHASONE SODIUM PHOSPHATE 10 MG/ML IJ SOLN
10.0000 mg | Freq: Once | INTRAMUSCULAR | Status: AC
  Administered 2016-08-26: 10 mg
  Filled 2016-08-26: qty 1

## 2016-08-26 MED ORDER — SODIUM CHLORIDE 0.9% FLUSH
2.0000 mL | Freq: Once | INTRAVENOUS | Status: AC
  Administered 2016-08-26: 2 mL

## 2016-08-26 MED ORDER — ROPIVACAINE HCL 2 MG/ML IJ SOLN
2.0000 mL | Freq: Once | INTRAMUSCULAR | Status: AC
  Administered 2016-08-26: 2 mL via EPIDURAL
  Filled 2016-08-26: qty 10

## 2016-08-26 NOTE — Patient Instructions (Addendum)
Pain Management Discharge Instructions  General Discharge Instructions :  If you need to reach your doctor call: Monday-Friday 8:00 am - 4:00 pm at [phone] or toll free [phone].  After clinic hours [phone] to have operator reach doctor.  Bring all of your medication bottles to all your appointments in the pain clinic.  To cancel or reschedule your appointment with Pain Management please remember to call 24 hours in advance to avoid a fee.  Refer to the educational materials which you have been given on: General Risks, I had my Procedure. Discharge Instructions, Post Sedation.  Post Procedure Instructions:  The drugs you were given will stay in your system until tomorrow, so for the next 24 hours you should not drive, make any legal decisions or drink any alcoholic beverages.  You may eat anything you prefer, but it is better to start with liquids then soups and crackers, and gradually work up to solid foods.  Please notify your doctor immediately if you have any unusual bleeding, trouble breathing or pain that is not related to your normal pain.  Depending on the type of procedure that was done, some parts of your body may feel week and/or numb.  This usually clears up by tonight or the next day.  Walk with the use of an assistive device or accompanied by an adult for the 24 hours.  You may use ice on the affected area for the first 24 hours.  Put ice in a Ziploc bag and cover with a towel and place against area 15 minutes on 15 minutes off.  You may switch to heat after 24 hours.Epidural Steroid Injection An epidural steroid injection is given to relieve pain in your neck, back, or legs that is caused by the irritation or swelling of a nerve root. This procedure involves injecting a steroid and numbing medicine (anesthetic) into the epidural space. The epidural space is the space between the outer covering of your spinal cord and the bones that form your backbone  (vertebra).  LET YOUR HEALTH CARE PROVIDER KNOW ABOUT:   Any allergies you have.  All medicines you are taking, including vitamins, herbs, eye drops, creams, and over-the-counter medicines such as aspirin.  Previous problems you or members of your family have had with the use of anesthetics.  Any blood disorders or blood clotting disorders you have.  Previous surgeries you have had.  Medical conditions you have. RISKS AND COMPLICATIONS Generally, this is a safe procedure. However, as with any procedure, complications can occur. Possible complications of epidural steroid injection include:  Headache.  Bleeding.  Infection.  Allergic reaction to the medicines.  Damage to your nerves. The response to this procedure depends on the underlying cause of the pain and its duration. People who have long-term (chronic) pain are less likely to benefit from epidural steroids than are those people whose pain comes on strong and suddenly. BEFORE THE PROCEDURE   Ask your health care provider about changing or stopping your regular medicines. You may be advised to stop taking blood-thinning medicines a few days before the procedure.  You may be given medicines to reduce anxiety.  Arrange for someone to take you home after the procedure. PROCEDURE   You will remain awake during the procedure. You may receive medicine to make you relaxed.  You will be asked to lie on your stomach.  The injection site will be cleaned.  The injection site will be numbed with a medicine (local anesthetic).  A needle will be   injected through your skin into the epidural space.  Your health care provider will use an X-ray machine to ensure that the steroid is delivered closest to the affected nerve. You may have minimal discomfort at this time.  Once the needle is in the right position, the local anesthetic and the steroid will be injected into the epidural space.  The needle will then be removed and a  bandage will be applied to the injection site. AFTER THE PROCEDURE   You may be monitored for a short time before you go home.  You may feel weakness or numbness in your arm or leg, which disappears within hours.  You may be allowed to eat, drink, and take your regular medicine.  You may have soreness at the site of the injection.   This information is not intended to replace advice given to you by your health care provider. Make sure you discuss any questions you have with your health care provider.   Document Released: 01/07/2008 Document Revised: 06/02/2013 Document Reviewed: 03/19/2013 Elsevier Interactive Patient Education 2016 [place]. Pain Management Discharge Instructions  General Discharge Instructions :  If you need to reach your doctor call: Monday-Friday 8:00 am - 4:00 pm at [phone] or toll free [phone].  After clinic hours [phone] to have operator reach doctor.  Bring all of your medication bottles to all your appointments in the pain clinic.  To cancel or reschedule your appointment with Pain Management please remember to call 24 hours in advance to avoid a fee.  Refer to the educational materials which you have been given on: General Risks, I had my Procedure. Discharge Instructions, Post Sedation.  Post Procedure Instructions:  The drugs you were given will stay in your system until tomorrow, so for the next 24 hours you should not drive, make any legal decisions or drink any alcoholic beverages.  You may eat anything you prefer, but it is better to start with liquids then soups and crackers, and gradually work up to solid foods.  Please notify your doctor immediately if you have any unusual bleeding, trouble breathing or pain that is not related to your normal pain.  Depending on the type of procedure that was done, some parts of your body may feel week and/or numb.  This usually clears up by tonight or the next day.  Walk with the use of  an assistive device or accompanied by an adult for the 24 hours.  You may use ice on the affected area for the first 24 hours.  Put ice in a Ziploc bag and cover with a towel and place against area 15 minutes on 15 minutes off.  You may switch to heat after 24 hours.Epidural Steroid Injection Patient Information  Description: The epidural space surrounds the nerves as they exit the spinal cord.  In some patients, the nerves can be compressed and inflamed by a bulging disc or a tight spinal canal (spinal stenosis).  By injecting steroids into the epidural space, we can bring irritated nerves into direct contact with a potentially helpful medication.  These steroids act directly on the irritated nerves and can reduce swelling and inflammation which often leads to decreased pain.  Epidural steroids may be injected anywhere along the spine and from the neck to the low back depending upon the location of your pain.   After numbing the skin with local anesthetic (like Novocaine), a small needle is passed into the epidural space slowly.  You may experience a sensation of pressure  while this is being done.  The entire block usually last less than 10 minutes.  Conditions which may be treated by epidural steroids:   Low back and leg pain  Neck and arm pain  Spinal stenosis  Post-laminectomy syndrome  Herpes zoster (shingles) pain  Pain from compression fractures  Preparation for the injection:  1. Do not eat any solid food or dairy products within 8 hours of your appointment.  2. You may drink clear liquids up to 3 hours before appointment.  Clear liquids include water, black coffee, juice or soda.  No milk or cream please. 3. You may take your regular medication, including pain medications, with a sip of water before your appointment  Diabetics should hold regular insulin (if taken separately) and take 1/2 normal NPH dos the morning of the procedure.  Carry some sugar containing items with you to  your appointment. 4. A driver must accompany you and be prepared to drive you home after your procedure.  5. Bring all your current medications with your. 6. An IV may be inserted and sedation may be given at the discretion of the physician.   7. A blood pressure cuff, EKG and other monitors will often be applied during the procedure.  Some patients may need to have extra oxygen administered for a short period. 8. You will be asked to provide medical information, including your allergies, prior to the procedure.  We must know immediately if you are taking blood thinners (like Coumadin/Warfarin)  Or if you are allergic to IV iodine contrast (dye). We must know if you could possible be pregnant.  Possible side-effects:  Bleeding from needle site  Infection (rare, may require surgery)  Nerve injury (rare)  Numbness & tingling (temporary)  Difficulty urinating (rare, temporary)  Spinal headache ( a headache worse with upright posture)  Light -headedness (temporary)  Pain at injection site (several days)  Decreased blood pressure (temporary)  Weakness in arm/leg (temporary)  Pressure sensation in back/neck (temporary)  Call if you experience:  Fever/chills associated with headache or increased back/neck pain.  Headache worsened by an upright position.  New onset weakness or numbness of an extremity below the injection site  Hives or difficulty breathing (go to the emergency room)  Inflammation or drainage at the infection site  Severe back/neck pain  Any new symptoms which are concerning to you  Please note:  Although the local anesthetic injected can often make your back or neck feel good for several hours after the injection, the pain will likely return.  It takes 3-7 days for steroids to work in the epidural space.  You may not notice any pain relief for at least that one week.  If effective, we will often do a series of three injections spaced 3-6 weeks apart to  maximally decrease your pain.  After the initial series, we generally will wait several months before considering a repeat injection of the same type.  If you have any questions, please call [phone] [facility]

## 2016-08-26 NOTE — Progress Notes (Signed)
Patient's Name: Rick ERBE Sr.  MRN: [id number]  Referring Provider: No ref. provider found  DOB: 1951/08/01  PCP: Pcp Not In System  DOS: 08/26/2016  Note by: Mason Burleigh A. Dossie Arbour, MD  Service setting: Ambulatory outpatient  Location: [facility] (AMB) Pain Management Facility  Visit type: Procedure  Specialty: Interventional Pain Management  Patient type: Established   Primary Reason for Visit: Interventional Pain Management Treatment. CC: Neck Pain (lower) and Back Pain (mid)  Procedure:  Anesthesia, Analgesia, Anxiolysis:  Type: Therapeutic Inter-Laminar Thoracic Epidural Block Region: Posterior Thoracolumbar Level: T6-7 Laterality: Midline       Type: Local Anesthesia Local Anesthetic: Lidocaine 1% Route: Infiltration ([place]/IM) IV Access: Declined Sedation: Declined  Indication(s): Analgesia          Indications: 1. Thoracic radicular pain (Right)   2. Thoracic spine pain    Pain Score: Pre-procedure: 5 /10 Post-procedure: 5 /10  Pre-Procedure Assessment:  Rick Carter is a 65 y.o. (year old), male patient, seen today for interventional treatment. He  has a past surgical history that includes Tonsillectomy; Lung removal, partial; and Neck surgery.. His primarily concern today is the Neck Pain (lower) and Back Pain (mid) The primary encounter diagnosis was Thoracic radicular pain (Right). A diagnosis of Thoracic spine pain was also pertinent to this visit.  Pain Type: Chronic pain Pain Location: Back Pain Orientation: Lower Pain Descriptors / Indicators: Constant, Aching Pain Frequency: Constant  Date of Last Visit: 08/19/16 Service Provided on Last Visit: Evaluation, Med Refill  Coagulation Parameters No results found for: INR, LABPROT, APTT, PLT Verification of the correct person, correct site (including marking of site), and correct procedure were performed and confirmed by the patient.  Consent: Before the procedure and under the influence of no sedative(s), amnesic(s),  or anxiolytics, the patient was informed of the treatment options, risks and possible complications. To fulfill our ethical and legal obligations, as recommended by the American Medical Association's Code of Ethics, I have informed the patient of my clinical impression; the nature and purpose of the treatment or procedure; the risks, benefits, and possible complications of the intervention; the alternatives, including doing nothing; the risk(s) and benefit(s) of the alternative treatment(s) or procedure(s); and the risk(s) and benefit(s) of doing nothing. The patient was provided information about the general risks and possible complications associated with the procedure. These may include, but are not limited to: failure to achieve desired goals, infection, bleeding, organ or nerve damage, allergic reactions, paralysis, and death. In addition, the patient was informed of those risks and complications associated to Spine-related procedures, such as failure to decrease pain; infection (i.e.: Meningitis, epidural or intraspinal abscess); bleeding (i.e.: epidural hematoma, subarachnoid hemorrhage, or any other type of intraspinal or peri-dural bleeding); organ or nerve damage (i.e.: Any type of peripheral nerve, nerve root, or spinal cord injury) with subsequent damage to sensory, motor, and/or autonomic systems, resulting in permanent pain, numbness, and/or weakness of one or several areas of the body; allergic reactions; (i.e.: anaphylactic reaction); and/or death. Furthermore, the patient was informed of those risks and complications associated with the medications. These include, but are not limited to: allergic reactions (i.e.: anaphylactic or anaphylactoid reaction(s)); adrenal axis suppression; blood sugar elevation that in diabetics may result in ketoacidosis or comma; water retention that in patients with history of congestive heart failure may result in shortness of breath, pulmonary edema, and  decompensation with resultant heart failure; weight gain; swelling or edema; medication-induced neural toxicity; particulate matter embolism and blood vessel occlusion with resultant  organ, and/or nervous system infarction; and/or aseptic necrosis of one or more joints. Finally, the patient was informed that Medicine is not an exact science; therefore, there is also the possibility of unforeseen or unpredictable risks and/or possible complications that may result in a catastrophic outcome. The patient indicated having understood very clearly. We have given the patient no guarantees and we have made no promises. Enough time was given to the patient to ask questions, all of which were answered to the patient's satisfaction. Rick Carter has indicated that he wanted to continue with the procedure.  Consent Attestation: I, the ordering provider, attest that I have discussed with the patient the benefits, risks, side-effects, alternatives, likelihood of achieving goals, and potential problems during recovery for the procedure that I have provided informed consent.  Pre-Procedure Preparation:  Safety Precautions: Allergies reviewed. The patient was asked about blood thinners, or active infections, both of which were denied. The patient was asked to confirm the procedure and laterality, before marking the site, and again before commencing the procedure. Appropriate site, procedure, and patient were confirmed by following the Joint Commission's Universal Protocol (UP.01.01.01), in the form of a "Time Out". The patient was asked to participate by confirming the accuracy of the "Time Out" information. Patient was assessed for positional comfort and pressure points before starting the procedure. Allergies: He is allergic to atorvastatin and varenicline. Allergy Precautions: None required Infection Control Precautions: Sterile technique used. Standard Universal Precautions were taken as recommended by the Department of  [facility] for Disease Control and Prevention (CDC). Standard pre-surgical skin prep was conducted. Respiratory hygiene and cough etiquette was practiced. Hand hygiene observed. Safe injection practices and needle disposal techniques followed. SDV (single dose vial) medications used. Medications properly checked for expiration dates and contaminants. Personal protective equipment (PPE) used as per protocol. Monitoring:  As per clinic protocol. Vitals:   08/26/16 1026 08/26/16 1035 08/26/16 1039 08/26/16 1049  BP:  (!) 163/77 (!) 173/74 (!) 166/80  Pulse: 60     Resp: _0 Temp:      TempSrc:      SpO2: 98% 97% 98% 98%  Weight:      Height:      Calculated BMI: Body mass index is 23.04 kg/m. Time-out: "Time-out" completed before starting procedure, as per protocol.  Description of Procedure Process:  Time-out: "Time-out" completed before starting procedure, as per protocol. Position: Prone Target Area: For Epidural Steroid injection(s), the target area is the  interlaminar space, initially targeting the lower border of the superior vertebral body lamina. Approach: Interlaminar approach. Area Prepped: Entire Posterior Thoracolumbar Region Prepping solution: Duraprep (Iodine Povacrylex [0.7% available Iodine] and Isopropyl Alcohol, 74% w/w) Safety Precautions: Aspiration looking for blood return was conducted prior to all injections. At no point did we inject any substances, as a needle was being advanced. No attempts were made at seeking any paresthesias. Safe injection practices and needle disposal techniques used. Medications properly checked for expiration dates. SDV (single dose vial) medications used. Description of the Procedure: Protocol guidelines were followed. The patient was placed in position over the fluoroscopy table. The target area was identified and the area prepped in the usual manner. Skin & deeper tissues infiltrated with local anesthetic. Appropriate  amount of time allowed to pass for local anesthetics to take effect. The procedure needles were then advanced to the target area. The inferior aspect of the superior lamina was contacted and the needle walked caudad, until the lamina was cleared.  The epidural space was identified using "loss-of-resistance technique" with 0.9% PF-NSS (2-11m), in a low friction 10cc LOR glass syringe. Proper needle placement was secured. Negative aspiration confirmed. Solution injected in intermittent fashion, asking for systemic symptoms every 0.5 cc of injectate. The needles were then removed and the area cleansed, making sure to leave some of the prepping solution behind to take advantage of its long term bactericidal properties. EBL: None Materials & Medications:  Needle(s) Type: Epidural needle Gauge: 22G Length: 3.5-in Medication(s): We administered dexamethasone, iopamidol, lidocaine (PF), sodium chloride flush, and ropivacaine (PF) 2 mg/ml (0.2%). Please see chart orders for dosing details.  Imaging Guidance (Spinal):  Type of Imaging Technique: Fluoroscopy Guidance (Spinal) Indication(s): Assistance in needle guidance and placement for procedures requiring needle placement in or near specific anatomical locations not easily accessible without such assistance. Exposure Time: Please see nurses notes. Contrast: Before injecting any contrast, we confirmed that the patient did not have an allergy to iodine, shellfish, or radiological contrast. Once satisfactory needle placement was completed at the desired level, radiological contrast was injected. Contrast injected under live fluoroscopy. No contrast complications. See chart for type and volume of contrast used. Fluoroscopic Guidance: I was personally present during the use of fluoroscopy. "Tunnel Vision Technique" used to obtain the best possible view of the target area. Parallax error corrected before commencing the procedure. "Direction-depth-direction" technique  used to introduce the needle under continuous pulsed fluoroscopy. Once target was reached, antero-posterior, oblique, and lateral fluoroscopic projection used confirm needle placement in all planes. Images permanently stored in EMR. Interpretation: I personally interpreted the imaging intraoperatively. Adequate needle placement confirmed in multiple planes. Appropriate spread of contrast into desired area was observed. No evidence of afferent or efferent intravascular uptake. No intrathecal or subarachnoid spread observed. Permanent images saved into the patient's record.  Antibiotic Prophylaxis:  Indication(s): No indications identified. Type:  Antibiotics Given (last 72 hours)    None      Post-operative Assessment:  Complications: No immediate post-treatment complications observed by team, or reported by patient. Disposition: The patient tolerated the entire procedure well. A repeat set of vitals were taken after the procedure and the patient was kept under observation following institutional policy, for this type of procedure. Post-procedural neurological assessment was performed, showing return to baseline, prior to discharge. The patient was provided with post-procedure discharge instructions, including a section on how to identify potential problems. Should any problems arise concerning this procedure, the patient was given instructions to immediately contact [place] at any time, without hesitation. In any case, we plan to contact the patient by telephone for a follow-up status report regarding this interventional procedure. Comments:  No additional relevant information.  Plan of Care  Discharge to: Discharge home  Medications ordered for procedure: Meds ordered this encounter  Medications  . dexamethasone (DECADRON) injection 10 mg  . iopamidol (ISOVUE-M) 41 % intrathecal injection 10 mL  . lidocaine (PF) (XYLOCAINE) 1 % injection 10 mL  . sodium chloride flush (NS) 0.9 % injection 2 mL   . ropivacaine (PF) 2 mg/ml (0.2%) (NAROPIN) epidural 2 mL   Medications administered: (For more details, see medical record) We administered dexamethasone, iopamidol, lidocaine (PF), sodium chloride flush, and ropivacaine (PF) 2 mg/ml (0.2%). Lab-work, Procedure(s), & Referral(s) Ordered: Orders Placed This Encounter  Procedures  . Thoracic Epidural Injection  . DG C-Arm 1-60 Min-No Report   Imaging Ordered: Results for orders placed in visit on 07/18/16  DG C-Arm 1-60 Min-No Report   Narrative CLINICAL DATA:  Assistance in needle guidance and placement for procedures  requiring needle placement in or near specific anatomical locations not  easily accessible without such assistance.   C-ARM 1-60 MINUTES  Fluoroscopy was utilized by the requesting physician.  No radiographic  interpretation.     New Prescriptions   No medications on file   Physician-requested Follow-up:  Return in about 2 weeks (around 09/09/2016) for Post-Procedure evaluation.  Future Appointments Date Time Provider [place]  09/26/2016 10:30 AM Milinda Pointer, MD [facility] None   Primary Care Physician: Pcp Not In System Location: [facility] Outpatient Pain Management Facility Note by: Kathlen Brunswick. Dossie Arbour, M.D, DABA, DABAPM, DABPM, DABIPP, FIPP  Disclaimer:  Medicine is not an exact science. The only guarantee in medicine is that nothing is guaranteed. It is important to note that the decision to proceed with this intervention was based on the information collected from the patient. The Data and conclusions were drawn from the patient's questionnaire, the interview, and the physical examination. Because the information was provided in large part by the patient, it cannot be guaranteed that it has not been purposely or unconsciously manipulated. Every effort has been made to obtain as much relevant data as possible for this evaluation. It is important to note that the conclusions that lead to this  procedure are derived in large part from the available data. Always take into account that the treatment will also be dependent on availability of resources and existing treatment guidelines, considered by other Pain Management Practitioners as being common knowledge and practice, at the time of the intervention. For Medico-Legal purposes, it is also important to point out that variation in procedural techniques and pharmacological choices are the acceptable norm. The indications, contraindications, technique, and results of the above procedure should only be interpreted and judged by a Board-Certified Interventional Pain Specialist with extensive familiarity and expertise in the same exact procedure and technique. Attempts at providing opinions without similar or greater experience and expertise than that of the treating physician will be considered as inappropriate and unethical, and shall result in a formal complaint to the state medical board and applicable specialty societies.  Instructions provided at this appointment: Patient Instructions  Pain Management Discharge Instructions  General Discharge Instructions :  If you need to reach your doctor call: Monday-Friday 8:00 am - 4:00 pm at [phone] or toll free [phone].  After clinic hours [phone] to have operator reach doctor.  Bring all of your medication bottles to all your appointments in the pain clinic.  To cancel or reschedule your appointment with Pain Management please remember to call 24 hours in advance to avoid a fee.  Refer to the educational materials which you have been given on: General Risks, I had my Procedure. Discharge Instructions, Post Sedation.  Post Procedure Instructions:  The drugs you were given will stay in your system until tomorrow, so for the next 24 hours you should not drive, make any legal decisions or drink any alcoholic beverages.  You may eat anything you prefer, but it is better to start  with liquids then soups and crackers, and gradually work up to solid foods.  Please notify your doctor immediately if you have any unusual bleeding, trouble breathing or pain that is not related to your normal pain.  Depending on the type of procedure that was done, some parts of your body may feel week and/or numb.  This usually clears up by tonight or the next day.  Walk with the use of an assistive  device or accompanied by an adult for the 24 hours.  You may use ice on the affected area for the first 24 hours.  Put ice in a Ziploc bag and cover with a towel and place against area 15 minutes on 15 minutes off.  You may switch to heat after 24 hours.Epidural Steroid Injection An epidural steroid injection is given to relieve pain in your neck, back, or legs that is caused by the irritation or swelling of a nerve root. This procedure involves injecting a steroid and numbing medicine (anesthetic) into the epidural space. The epidural space is the space between the outer covering of your spinal cord and the bones that form your backbone (vertebra).  LET [facility] CARE PROVIDER KNOW ABOUT:   Any allergies you have.  All medicines you are taking, including vitamins, herbs, eye drops, creams, and over-the-counter medicines such as aspirin.  Previous problems you or members of your family have had with the use of anesthetics.  Any blood disorders or blood clotting disorders you have.  Previous surgeries you have had.  Medical conditions you have. RISKS AND COMPLICATIONS Generally, this is a safe procedure. However, as with any procedure, complications can occur. Possible complications of epidural steroid injection include:  Headache.  Bleeding.  Infection.  Allergic reaction to the medicines.  Damage to your nerves. The response to this procedure depends on the underlying cause of the pain and its duration. People who have long-term (chronic) pain are less likely to benefit from  epidural steroids than are those people whose pain comes on strong and suddenly. BEFORE THE PROCEDURE   Ask your health care provider about changing or stopping your regular medicines. You may be advised to stop taking blood-thinning medicines a few days before the procedure.  You may be given medicines to reduce anxiety.  Arrange for someone to take you home after the procedure. PROCEDURE   You will remain awake during the procedure. You may receive medicine to make you relaxed.  You will be asked to lie on your stomach.  The injection site will be cleaned.  The injection site will be numbed with a medicine (local anesthetic).  A needle will be injected through your skin into the epidural space.  Your health care provider will use an X-ray machine to ensure that the steroid is delivered closest to the affected nerve. You may have minimal discomfort at this time.  Once the needle is in the right position, the local anesthetic and the steroid will be injected into the epidural space.  The needle will then be removed and a bandage will be applied to the injection site. AFTER THE PROCEDURE   You may be monitored for a short time before you go home.  You may feel weakness or numbness in your arm or leg, which disappears within hours.  You may be allowed to eat, drink, and take your regular medicine.  You may have soreness at the site of the injection.   This information is not intended to replace advice given to you by your health care provider. Make sure you discuss any questions you have with your health care provider.   Document Released: 01/07/2008 Document Revised: 06/02/2013 Document Reviewed: 03/19/2013 Elsevier Interactive Patient Education 2016 [place]. Pain Management Discharge Instructions  General Discharge Instructions :  If you need to reach your doctor call: Monday-Friday 8:00 am - 4:00 pm at [phone] or toll free [phone].  After clinic hours  [phone] to have operator reach doctor.  Bring all of your medication bottles to all your appointments in the pain clinic.  To cancel or reschedule your appointment with Pain Management please remember to call 24 hours in advance to avoid a fee.  Refer to the educational materials which you have been given on: General Risks, I had my Procedure. Discharge Instructions, Post Sedation.  Post Procedure Instructions:  The drugs you were given will stay in your system until tomorrow, so for the next 24 hours you should not drive, make any legal decisions or drink any alcoholic beverages.  You may eat anything you prefer, but it is better to start with liquids then soups and crackers, and gradually work up to solid foods.  Please notify your doctor immediately if you have any unusual bleeding, trouble breathing or pain that is not related to your normal pain.  Depending on the type of procedure that was done, some parts of your body may feel week and/or numb.  This usually clears up by tonight or the next day.  Walk with the use of an assistive device or accompanied by an adult for the 24 hours.  You may use ice on the affected area for the first 24 hours.  Put ice in a Ziploc bag and cover with a towel and place against area 15 minutes on 15 minutes off.  You may switch to heat after 24 hours.Epidural Steroid Injection Patient Information  Description: The epidural space surrounds the nerves as they exit the spinal cord.  In some patients, the nerves can be compressed and inflamed by a bulging disc or a tight spinal canal (spinal stenosis).  By injecting steroids into the epidural space, we can bring irritated nerves into direct contact with a potentially helpful medication.  These steroids act directly on the irritated nerves and can reduce swelling and inflammation which often leads to decreased pain.  Epidural steroids may be injected anywhere along the spine and from the neck to the low back  depending upon the location of your pain.   After numbing the skin with local anesthetic (like Novocaine), a small needle is passed into the epidural space slowly.  You may experience a sensation of pressure while this is being done.  The entire block usually last less than 10 minutes.  Conditions which may be treated by epidural steroids:   Low back and leg pain  Neck and arm pain  Spinal stenosis  Post-laminectomy syndrome  Herpes zoster (shingles) pain  Pain from compression fractures  Preparation for the injection:  1. Do not eat any solid food or dairy products within 8 hours of your appointment.  2. You may drink clear liquids up to 3 hours before appointment.  Clear liquids include water, black coffee, juice or soda.  No milk or cream please. 3. You may take your regular medication, including pain medications, with a sip of water before your appointment  Diabetics should hold regular insulin (if taken separately) and take 1/2 normal NPH dos the morning of the procedure.  Carry some sugar containing items with you to your appointment. 4. A driver must accompany you and be prepared to drive you home after your procedure.  5. Bring all your current medications with your. 6. An IV may be inserted and sedation may be given at the discretion of the physician.   7. A blood pressure cuff, EKG and other monitors will often be applied during the procedure.  Some patients may need to have extra oxygen administered for a short  period. 8. You will be asked to provide medical information, including your allergies, prior to the procedure.  We must know immediately if you are taking blood thinners (like Coumadin/Warfarin)  Or if you are allergic to IV iodine contrast (dye). We must know if you could possible be pregnant.  Possible side-effects:  Bleeding from needle site  Infection (rare, may require surgery)  Nerve injury (rare)  Numbness & tingling (temporary)  Difficulty urinating  (rare, temporary)  Spinal headache ( a headache worse with upright posture)  Light -headedness (temporary)  Pain at injection site (several days)  Decreased blood pressure (temporary)  Weakness in arm/leg (temporary)  Pressure sensation in back/neck (temporary)  Call if you experience:  Fever/chills associated with headache or increased back/neck pain.  Headache worsened by an upright position.  New onset weakness or numbness of an extremity below the injection site  Hives or difficulty breathing (go to the emergency room)  Inflammation or drainage at the infection site  Severe back/neck pain  Any new symptoms which are concerning to you  Please note:  Although the local anesthetic injected can often make your back or neck feel good for several hours after the injection, the pain will likely return.  It takes 3-7 days for steroids to work in the epidural space.  You may not notice any pain relief for at least that one week.  If effective, we will often do a series of three injections spaced 3-6 weeks apart to maximally decrease your pain.  After the initial series, we generally will wait several months before considering a repeat injection of the same type.  If you have any questions, please call [phone] [facility]

## 2016-08-27 ENCOUNTER — Telehealth: Payer: Self-pay | Admitting: *Deleted

## 2016-08-27 NOTE — Telephone Encounter (Signed)
No problems post procedure. 

## 2016-08-27 NOTE — Telephone Encounter (Signed)
No problems post procedure.

## 2016-09-23 ENCOUNTER — Encounter: Payer: Medicare Other | Admitting: Pain Medicine

## 2016-09-26 ENCOUNTER — Encounter: Payer: Self-pay | Admitting: Pain Medicine

## 2016-09-26 ENCOUNTER — Ambulatory Visit: Payer: Medicare Other | Attending: Pain Medicine | Admitting: Pain Medicine

## 2016-09-26 VITALS — BP 160/76 | HR 76 | Temp 97.6°F | Resp 16 | Ht 69.0 in | Wt 155.0 lb

## 2016-09-26 DIAGNOSIS — Z811 Family history of alcohol abuse and dependence: Secondary | ICD-10-CM | POA: Insufficient documentation

## 2016-09-26 DIAGNOSIS — Z79891 Long term (current) use of opiate analgesic: Secondary | ICD-10-CM

## 2016-09-26 DIAGNOSIS — Z7982 Long term (current) use of aspirin: Secondary | ICD-10-CM | POA: Diagnosis not present

## 2016-09-26 DIAGNOSIS — G8929 Other chronic pain: Secondary | ICD-10-CM

## 2016-09-26 DIAGNOSIS — F419 Anxiety disorder, unspecified: Secondary | ICD-10-CM | POA: Insufficient documentation

## 2016-09-26 DIAGNOSIS — M150 Primary generalized (osteo)arthritis: Secondary | ICD-10-CM

## 2016-09-26 DIAGNOSIS — H3530 Unspecified macular degeneration: Secondary | ICD-10-CM | POA: Insufficient documentation

## 2016-09-26 DIAGNOSIS — M5416 Radiculopathy, lumbar region: Secondary | ICD-10-CM

## 2016-09-26 DIAGNOSIS — F1190 Opioid use, unspecified, uncomplicated: Secondary | ICD-10-CM | POA: Diagnosis not present

## 2016-09-26 DIAGNOSIS — E785 Hyperlipidemia, unspecified: Secondary | ICD-10-CM | POA: Diagnosis not present

## 2016-09-26 DIAGNOSIS — J449 Chronic obstructive pulmonary disease, unspecified: Secondary | ICD-10-CM | POA: Insufficient documentation

## 2016-09-26 DIAGNOSIS — I4891 Unspecified atrial fibrillation: Secondary | ICD-10-CM | POA: Diagnosis not present

## 2016-09-26 DIAGNOSIS — I10 Essential (primary) hypertension: Secondary | ICD-10-CM | POA: Diagnosis not present

## 2016-09-26 DIAGNOSIS — T402X5A Adverse effect of other opioids, initial encounter: Secondary | ICD-10-CM | POA: Insufficient documentation

## 2016-09-26 DIAGNOSIS — X58XXXA Exposure to other specified factors, initial encounter: Secondary | ICD-10-CM | POA: Diagnosis not present

## 2016-09-26 DIAGNOSIS — M5412 Radiculopathy, cervical region: Secondary | ICD-10-CM | POA: Insufficient documentation

## 2016-09-26 DIAGNOSIS — M48061 Spinal stenosis, lumbar region without neurogenic claudication: Secondary | ICD-10-CM | POA: Insufficient documentation

## 2016-09-26 DIAGNOSIS — M5441 Lumbago with sciatica, right side: Secondary | ICD-10-CM | POA: Diagnosis not present

## 2016-09-26 DIAGNOSIS — M5116 Intervertebral disc disorders with radiculopathy, lumbar region: Secondary | ICD-10-CM | POA: Diagnosis not present

## 2016-09-26 DIAGNOSIS — G894 Chronic pain syndrome: Secondary | ICD-10-CM | POA: Insufficient documentation

## 2016-09-26 DIAGNOSIS — M797 Fibromyalgia: Secondary | ICD-10-CM | POA: Diagnosis not present

## 2016-09-26 DIAGNOSIS — I739 Peripheral vascular disease, unspecified: Secondary | ICD-10-CM | POA: Diagnosis not present

## 2016-09-26 DIAGNOSIS — M1288 Other specific arthropathies, not elsewhere classified, other specified site: Secondary | ICD-10-CM

## 2016-09-26 DIAGNOSIS — M5414 Radiculopathy, thoracic region: Secondary | ICD-10-CM | POA: Diagnosis not present

## 2016-09-26 DIAGNOSIS — M65342 Trigger finger, left ring finger: Secondary | ICD-10-CM | POA: Insufficient documentation

## 2016-09-26 DIAGNOSIS — F17210 Nicotine dependence, cigarettes, uncomplicated: Secondary | ICD-10-CM | POA: Diagnosis not present

## 2016-09-26 DIAGNOSIS — M65332 Trigger finger, left middle finger: Secondary | ICD-10-CM | POA: Insufficient documentation

## 2016-09-26 DIAGNOSIS — K5903 Drug induced constipation: Secondary | ICD-10-CM

## 2016-09-26 DIAGNOSIS — Z841 Family history of disorders of kidney and ureter: Secondary | ICD-10-CM | POA: Insufficient documentation

## 2016-09-26 DIAGNOSIS — K5900 Constipation, unspecified: Secondary | ICD-10-CM | POA: Insufficient documentation

## 2016-09-26 DIAGNOSIS — M47816 Spondylosis without myelopathy or radiculopathy, lumbar region: Secondary | ICD-10-CM

## 2016-09-26 DIAGNOSIS — Z902 Acquired absence of lung [part of]: Secondary | ICD-10-CM | POA: Diagnosis not present

## 2016-09-26 DIAGNOSIS — Z7984 Long term (current) use of oral hypoglycemic drugs: Secondary | ICD-10-CM | POA: Diagnosis not present

## 2016-09-26 DIAGNOSIS — M159 Polyosteoarthritis, unspecified: Secondary | ICD-10-CM

## 2016-09-26 MED ORDER — OXYCODONE-ACETAMINOPHEN 5-325 MG PO TABS: 1.0000 | ORAL_TABLET | Freq: Four times a day (QID) | ORAL | 0 refills | Status: DC | PRN

## 2016-09-26 MED ORDER — PREGABALIN 150 MG PO CAPS: 150.0000 mg | ORAL_CAPSULE | Freq: Two times a day (BID) | ORAL | 0 refills | Status: DC

## 2016-09-26 MED ORDER — DOCUSATE SODIUM 100 MG PO CAPS: 100.0000 mg | ORAL_CAPSULE | Freq: Every day | ORAL | 0 refills | Status: DC

## 2016-09-26 MED ORDER — IBUPROFEN 800 MG PO TABS: 800.0000 mg | ORAL_TABLET | Freq: Three times a day (TID) | ORAL | 0 refills | Status: DC | PRN

## 2016-09-26 NOTE — Progress Notes (Signed)
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 palliative trigger finger steroid injection. No fluoroscopy or sedation.    Considering:   Palliative bilateral lumbar facet block under fluoroscopic guidance and IV sedation.  Palliative left-sided cervical epidural steroid injection under fluoroscopic guidance, with or without sedation.  Palliative right-sided L5-S1 lumbar epidural steroid injection under fluoroscopic guidance, with or without sedation.  Diagnostic bilateral cervical facet block under fluoroscopic guidance and IV sedation.  Palliative Right-sided T8-9 thoracic epidural steroid injection under fluoroscopic guidance, without IV sedation.    Palliative PRN treatment(s):   Palliative bilateral lumbar facet block under fluoroscopic guidance and IV sedation. Palliative left-sided cervical epidural steroid injection under fluoroscopic guidance, with or without sedation.  Palliative right-sided L5-S1 lumbar epidural steroid injection under fluoroscopic guidance, with or without sedation.    Provider-requested follow-up: Return in about 3 months (around 12/25/2016)  for Med-Mgmt, in addition, procedure: Left hand  trigger finger injection.  Future Appointments Date Time Provider [place]  10/16/2016 10:00 AM Milinda Pointer, MD [facility] None  12/24/2016 10:30 AM Milinda Pointer, MD [facility] None   Primary Care Physician: Pcp Not In System Location: [facility] Outpatient Pain Management Facility Note by: Kathlen Brunswick. Dossie Arbour, M.D, DABA, DABAPM, DABPM, DABIPP, FIPP Date: 09/26/16; Time: 1:43 PM  Pain Score Disclaimer: We use the NRS-11 scale. This is a self-reported, subjective measurement of pain severity with only modest accuracy. It is used primarily to identify changes within a particular patient. It must be understood that outpatient pain scales are significantly less accurate that those used for research, where they can be applied under ideal controlled circumstances with minimal exposure to variables. In reality, the score is likely to be a combination of pain intensity and pain affect, where pain affect describes the degree of emotional arousal or changes in action readiness caused by the sensory experience of pain. Factors such as social and work situation, setting, emotional state, anxiety levels, expectation, and prior pain experience may influence pain perception and show large inter-individual differences that may also be affected by time variables.  Patient instructions provided during this appointment: There are no Patient Instructions on file for this visit.

## 2016-09-26 NOTE — Progress Notes (Signed)
Nursing Pain Medication Assessment:  Safety precautions to be maintained throughout the outpatient stay will include: orient to surroundings, keep bed in low position, maintain call bell within reach at all times, provide assistance with transfer out of bed and ambulation.  Medication Inspection Compliance: Pill count conducted under aseptic conditions, in front of the patient. Neither the pills nor the bottle was removed from the patient's sight at any time. Once count was completed pills were immediately returned to the patient in their original bottle.  Medication: Oxycodone/APAP Pill Count: 44 of 120 pills remain Bottle Appearance: Standard pharmacy container. Clearly labeled. Filled Date: 51 / 25 / 2017 Medication last intake:09/26/2016

## 2016-10-16 ENCOUNTER — Ambulatory Visit: Payer: Medicare Other | Admitting: Pain Medicine

## 2016-10-28 ENCOUNTER — Encounter: Payer: Self-pay | Admitting: Pain Medicine

## 2016-10-28 ENCOUNTER — Ambulatory Visit: Payer: Medicare Other | Attending: Pain Medicine | Admitting: Pain Medicine

## 2016-10-28 VITALS — BP 162/65 | HR 68 | Temp 98.0°F | Resp 18 | Ht 69.5 in | Wt 156.0 lb

## 2016-10-28 DIAGNOSIS — M542 Cervicalgia: Secondary | ICD-10-CM | POA: Insufficient documentation

## 2016-10-28 DIAGNOSIS — M65342 Trigger finger, left ring finger: Secondary | ICD-10-CM | POA: Diagnosis not present

## 2016-10-28 DIAGNOSIS — M79642 Pain in left hand: Secondary | ICD-10-CM | POA: Insufficient documentation

## 2016-10-28 DIAGNOSIS — M549 Dorsalgia, unspecified: Secondary | ICD-10-CM | POA: Insufficient documentation

## 2016-10-28 MED ORDER — LIDOCAINE HCL (PF) 1 % IJ SOLN
5.0000 mL | Freq: Once | INTRAMUSCULAR | Status: AC
  Administered 2016-10-28: 5 mL
  Filled 2016-10-28: qty 5

## 2016-10-28 MED ORDER — METHYLPREDNISOLONE ACETATE 80 MG/ML IJ SUSP
80.0000 mg | Freq: Once | INTRAMUSCULAR | Status: AC
  Administered 2016-10-28: 80 mg via INTRA_ARTICULAR
  Filled 2016-10-28: qty 1

## 2016-10-28 MED ORDER — ROPIVACAINE HCL 2 MG/ML IJ SOLN
2.0000 mL | Freq: Once | INTRAMUSCULAR | Status: AC
  Administered 2016-10-28: 2 mL via INTRA_ARTICULAR
  Filled 2016-10-28: qty 10

## 2016-10-28 NOTE — Patient Instructions (Signed)

## 2016-10-28 NOTE — Progress Notes (Signed)
Safety precautions to be maintained throughout the outpatient stay will include: orient to surroundings, keep bed in low position, maintain call bell within reach at all times, provide assistance with transfer out of bed and ambulation.

## 2016-10-28 NOTE — Progress Notes (Signed)
Patient's Name: Rick DELACRUZ Sr.  MRN: [id number]  Referring Provider: No ref. provider found  DOB: May 18, 1951  PCP: Pcp Not In System  DOS: 10/28/2016  Note by: Juanmanuel Marohl A. Dossie Arbour, MD  Service setting: Ambulatory outpatient  Location: [facility] (AMB) Pain Management Facility  Visit type: Procedure  Specialty: Interventional Pain Management  Patient type: Established   Primary Reason for Visit: Interventional Pain Management Treatment. CC: Hand Pain (left middle finger); Neck Pain (radiates to left shoulder); and Back Pain  Procedure:  Anesthesia, Analgesia, Anxiolysis:  Type: Trigger Finger Ligament/Tendon sheath (20550) Injection. Digit: No:4(Ring) Finger Laterality: Left-hand Position: Sitting Target Area: Tendon sheath nodule Approach: Percutaneous Region: A-4 pulley of the metacarpal area Primary Purpose: Palliative  Type: Local Anesthesia Local Anesthetic: Lidocaine 1% Route: Infiltration ([place]/IM) IV Access: Declined Sedation: Declined  Indication(s): Analgesia          Indications: 1. Trigger finger, left ring finger    Pain Score: Pre-procedure: 4 /10 Post-procedure: 0-No pain/10  Pre-op Assessment:  Previous date of service: 09/26/16 Service provided: Med Refill Rick Carter is a 66 y.o. (year old), male patient, seen today for interventional treatment. He  has a past surgical history that includes Tonsillectomy; Lung removal, partial; and Neck surgery. His primarily concern today is the Hand Pain (left middle finger); Neck Pain (radiates to left shoulder); and Back Pain  Blood pressure (!) 162/65, pulse 68, temperature 98 F (36.7 C), temperature source Oral, resp. rate 18, height 5' 9.5" (1.765 m), weight 156 lb (70.8 kg), SpO2 100 %.Calculated BMI: Body mass index is 22.71 kg/m. Risk Assessment: Allergies: Reviewed. He is allergic to atorvastatin and varenicline. Allergy Precautions: None required Coagulopathies: "Reviewed. None identified. Blood-thinner therapy: None at  this time Active Infection(s): Reviewed. None identified. Rick Carter is afebrile Site Confirmation: Rick Carter was asked to confirm the procedure and laterality before marking the site Procedure checklist: Completed Consent: Before the procedure and under the influence of no sedative(s), amnesic(s), or anxiolytics, the patient was informed of the treatment options, risks and possible complications. To fulfill our ethical and legal obligations, as recommended by the American Medical Association's Code of Ethics, I have informed the patient of my clinical impression; the nature and purpose of the treatment or procedure; the risks, benefits, and possible complications of the intervention; the alternatives, including doing nothing; the risk(s) and benefit(s) of the alternative treatment(s) or procedure(s); and the risk(s) and benefit(s) of doing nothing. The patient was provided information about the general risks and possible complications associated with the procedure. These may include, but are not limited to: failure to achieve desired goals, infection, bleeding, organ or nerve damage, allergic reactions, paralysis, and death. In addition, the patient was informed of those risks and complications associated to the procedure, such as failure to decrease pain; infection; bleeding; organ or nerve damage with subsequent damage to sensory, motor, and/or autonomic systems, resulting in permanent pain, numbness, and/or weakness of one or several areas of the body; allergic reactions; (i.e.: anaphylactic reaction); and/or death. Furthermore, the patient was informed of those risks and complications associated with the medications. These include, but are not limited to: allergic reactions (i.e.: anaphylactic or anaphylactoid reaction(s)); adrenal axis suppression; blood sugar elevation that in diabetics may result in ketoacidosis or comma; water retention that in patients with history of congestive heart failure may  result in shortness of breath, pulmonary edema, and decompensation with resultant heart failure; weight gain; swelling or edema; medication-induced neural toxicity; particulate matter embolism and blood vessel occlusion  with resultant organ, and/or nervous system infarction; and/or aseptic necrosis of one or more joints. Finally, the patient was informed that Medicine is not an exact science; therefore, there is also the possibility of unforeseen or unpredictable risks and/or possible complications that may result in a catastrophic outcome. The patient indicated having understood very clearly. We have given the patient no guarantees and we have made no promises. Enough time was given to the patient to ask questions, all of which were answered to the patient's satisfaction. Rick Carter has indicated that he wanted to continue with the procedure. Attestation: I, the ordering provider, attest that I have discussed with the patient the benefits, risks, side-effects, alternatives, likelihood of achieving goals, and potential problems during recovery for the procedure that I have provided informed consent. Date: 10/28/2016; Time: 10:58 AM  Pre-Procedure Preparation:  Monitoring: As per clinic protocol. Respiration, ETCO2, SpO2, BP, heart rate and rhythm monitor placed and checked for adequate function Safety Precautions: Patient was assessed for positional comfort and pressure points before starting the procedure. Time-out: I initiated and conducted the "Time-out" before starting the procedure, as per protocol. The patient was asked to participate by confirming the accuracy of the "Time Out" information. Verification of the correct person, site, and procedure were performed and confirmed by me, the nursing staff, and the patient. "Time-out" conducted as per Joint Commission's Universal Protocol (UP.01.01.01). Date: 10/28/2016; Time: 1117 hrs.  Description of Procedure Process:   Area Prepped: Entire palmar and  dorsal aspect of hand, up to forearm area. Prepping solution: ChloraPrep (2% chlorhexidine gluconate and 70% isopropyl alcohol) Safety Precautions: Aspiration looking for blood return was conducted prior to all injections. At no point did we inject any substances, as a needle was being advanced. No attempts were made at seeking any paresthesias. Safe injection practices and needle disposal techniques used. Medications properly checked for expiration dates. SDV (single dose vial) medications used. Description of the Procedure: Protocol guidelines were followed. The patient was placed in position. The target area was identified and prepped in the usual manner. Skin & deeper tissues infiltrated with local anesthetic. Appropriate time provided for local anesthetics to take effect. The procedure needle was slowly advanced to target area. Proper needle placement secured. Negative aspiration confirmed. Solution injected in intermittent fashion, asking for systemic symptoms every 0.5cc. Needle(s) removed and area cleaned, making sure to leave some prepping solution back to take advantage of its long term bactericidal properties. Materials:  Needle(s) Type: Epidural needle Gauge: 25G Length: 1.5-in Medication(s): We administered methylPREDNISolone acetate, ropivacaine (PF) 2 mg/mL (0.2%), and lidocaine (PF). Please see chart orders for dosing details.  Imaging Guidance:  Type of Imaging Technique: None used Indication(s): N/A Exposure Time: No patient exposure Contrast: None used. Fluoroscopic Guidance: N/A Ultrasound Guidance: N/A Interpretation: N/A  Post-operative Assessment:  EBL: None Complications: No immediate post-treatment complications observed by team, or reported by patient. Note: The patient tolerated the entire procedure well. A repeat set of vitals were taken after the procedure and the patient was kept under observation following institutional policy, for this type of procedure.  Post-procedural neurological assessment was performed, showing return to baseline, prior to discharge. The patient was provided with post-procedure discharge instructions, including a section on how to identify potential problems. Should any problems arise concerning this procedure, the patient was given instructions to immediately contact us, at any time, without hesitation. In any case, we plan to contact the patient by telephone for a follow-up status report regarding this interventional procedure. Comments:  No additional relevant information.  Plan of Care  Disposition: Discharge home Discharge Date: 10/28/2016; Discharge Time: 1125 hrs Physician-requested Follow-up:  Return for Keep previous appointment for follow-up.  Future Appointments Date Time Provider [place]  12/24/2016 10:30 AM Milinda Pointer, MD [facility] None   Medications ordered for procedure: Meds ordered this encounter  Medications  . methylPREDNISolone acetate (DEPO-MEDROL) injection 80 mg  . ropivacaine (PF) 2 mg/mL (0.2%) (NAROPIN) injection 2 mL  . lidocaine (PF) (XYLOCAINE) 1 % injection 5 mL   Medications administered: We administered methylPREDNISolone acetate, ropivacaine (PF) 2 mg/mL (0.2%), and lidocaine (PF).  See the medical record for exact dosing, route, and time of administration.  Lab-work, Procedure(s), & Referral(s) Ordered: Orders Placed This Encounter  Procedures  . Injection tendon or ligament  . Discharge instructions  . Follow-up  . Informed Consent Details: Transcribe to consent form and obtain patient signature  . Provider attestation of informed consent for procedure/surgical case  . Verify informed consent   Imaging Ordered: Results for orders placed in visit on 08/26/16  DG C-Arm 1-60 Min-No Report   Narrative Fluoroscopy was utilized by the requesting physician.  No radiographic  interpretation.    New Prescriptions   No medications on file    Primary Care  Physician: Pcp Not In System Location: [facility] Outpatient Pain Management Facility Note by: Kathlen Brunswick. Dossie Arbour, M.D, DABA, DABAPM, DABPM, DABIPP, FIPP Date: 10/28/2016; Time: 1:50 PM  Disclaimer:  Medicine is not an exact science. The only guarantee in medicine is that nothing is guaranteed. It is important to note that the decision to proceed with this intervention was based on the information collected from the patient. The Data and conclusions were drawn from the patient's questionnaire, the interview, and the physical examination. Because the information was provided in large part by the patient, it cannot be guaranteed that it has not been purposely or unconsciously manipulated. Every effort has been made to obtain as much relevant data as possible for this evaluation. It is important to note that the conclusions that lead to this procedure are derived in large part from the available data. Always take into account that the treatment will also be dependent on availability of resources and existing treatment guidelines, considered by other Pain Management Practitioners as being common knowledge and practice, at the time of the intervention. For Medico-Legal purposes, it is also important to point out that variation in procedural techniques and pharmacological choices are the acceptable norm. The indications, contraindications, technique, and results of the above procedure should only be interpreted and judged by a Board-Certified Interventional Pain Specialist with extensive familiarity and expertise in the same exact procedure and technique. Attempts at providing opinions without similar or greater experience and expertise than that of the treating physician will be considered as inappropriate and unethical, and shall result in a formal complaint to the state medical board and applicable specialty societies.  Instructions provided at this appointment: Patient Instructions  Pain Management Discharge  Instructions  General Discharge Instructions :  If you need to reach your doctor call: Monday-Friday 8:00 am - 4:00 pm at [phone] or toll free [phone].  After clinic hours [phone] to have operator reach doctor.  Bring all of your medication bottles to all your appointments in the pain clinic.  To cancel or reschedule your appointment with Pain Management please remember to call 24 hours in advance to avoid a fee.  Refer to the educational materials which you have been given on: General Risks, I  had my Procedure. Discharge Instructions, Post Sedation.  Post Procedure Instructions:  The drugs you were given will stay in your system until tomorrow, so for the next 24 hours you should not drive, make any legal decisions or drink any alcoholic beverages.  You may eat anything you prefer, but it is better to start with liquids then soups and crackers, and gradually work up to solid foods.  Please notify your doctor immediately if you have any unusual bleeding, trouble breathing or pain that is not related to your normal pain.  Depending on the type of procedure that was done, some parts of your body may feel week and/or numb.  This usually clears up by tonight or the next day.  Walk with the use of an assistive device or accompanied by an adult for the 24 hours.  You may use ice on the affected area for the first 24 hours.  Put ice in a Ziploc bag and cover with a towel and place against area 15 minutes on 15 minutes off.  You may switch to heat after 24 hours.

## 2016-10-29 ENCOUNTER — Telehealth: Payer: Self-pay

## 2016-10-29 NOTE — Telephone Encounter (Signed)
No answer- No answering machine

## 2016-12-11 ENCOUNTER — Other Ambulatory Visit: Payer: Self-pay | Admitting: Pain Medicine

## 2016-12-11 ENCOUNTER — Ambulatory Visit
Admission: RE | Admit: 2016-12-11 | Discharge: 2016-12-11 | Disposition: A | Discharge status: Home / Self Care | Payer: Medicare Other | Source: Ambulatory Visit | Attending: Pain Medicine | Admitting: Pain Medicine

## 2016-12-11 ENCOUNTER — Encounter: Payer: Self-pay | Admitting: Pain Medicine

## 2016-12-11 ENCOUNTER — Ambulatory Visit (HOSPITAL_BASED_OUTPATIENT_CLINIC_OR_DEPARTMENT_OTHER): Payer: Medicare Other | Admitting: Pain Medicine

## 2016-12-11 VITALS — BP 177/86 | HR 59 | Temp 98.2°F | Resp 15 | Ht 69.0 in | Wt 156.0 lb

## 2016-12-11 DIAGNOSIS — G8929 Other chronic pain: Secondary | ICD-10-CM

## 2016-12-11 DIAGNOSIS — M961 Postlaminectomy syndrome, not elsewhere classified: Secondary | ICD-10-CM

## 2016-12-11 DIAGNOSIS — R52 Pain, unspecified: Secondary | ICD-10-CM | POA: Insufficient documentation

## 2016-12-11 DIAGNOSIS — M47812 Spondylosis without myelopathy or radiculopathy, cervical region: Secondary | ICD-10-CM

## 2016-12-11 DIAGNOSIS — M5412 Radiculopathy, cervical region: Secondary | ICD-10-CM

## 2016-12-11 MED ORDER — LIDOCAINE HCL (PF) 1 % IJ SOLN
10.0000 mL | Freq: Once | INTRAMUSCULAR | Status: AC
  Administered 2016-12-11: 5 mL
  Filled 2016-12-11: qty 10

## 2016-12-11 MED ORDER — SODIUM CHLORIDE 0.9 % IJ SOLN
INTRAMUSCULAR | Status: AC
  Filled 2016-12-11: qty 10

## 2016-12-11 MED ORDER — DEXAMETHASONE SODIUM PHOSPHATE 10 MG/ML IJ SOLN
10.0000 mg | Freq: Once | INTRAMUSCULAR | Status: DC
  Filled 2016-12-11: qty 1

## 2016-12-11 MED ORDER — ROPIVACAINE HCL 5 MG/ML IJ SOLN
0.5000 mL | Freq: Once | INTRAMUSCULAR | Status: AC
  Administered 2016-12-11: 0.5 mL via EPIDURAL
  Filled 2016-12-11: qty 20

## 2016-12-11 MED ORDER — IOPAMIDOL (ISOVUE-M 200) INJECTION 41%
10.0000 mL | Freq: Once | INTRAMUSCULAR | Status: AC
Start: 2016-12-11 — End: 2016-12-11
  Administered 2016-12-11: 10 mL via EPIDURAL
  Filled 2016-12-11: qty 10

## 2016-12-11 MED ORDER — DEXAMETHASONE SODIUM PHOSPHATE 4 MG/ML IJ SOLN
INTRAMUSCULAR | Status: AC
  Filled 2016-12-11: qty 3

## 2016-12-11 MED ORDER — SODIUM CHLORIDE 0.9% FLUSH: 1.0000 mL | Freq: Once | INTRAVENOUS | Status: DC

## 2016-12-11 NOTE — Patient Instructions (Signed)

## 2016-12-11 NOTE — Progress Notes (Signed)
Safety precautions to be maintained throughout the outpatient stay will include: orient to surroundings, keep bed in low position, maintain call bell within reach at all times, provide assistance with transfer out of bed and ambulation.

## 2016-12-11 NOTE — Progress Notes (Signed)
Patient's Name: Rick LASCH Sr.  MRN: [id number]  Referring Provider: No ref. provider found  DOB: October 04, 1951  PCP: Pcp Not In System  DOS: 12/11/2016  Note by: Yancy Knoble A. Dossie Arbour, MD  Service setting: Ambulatory outpatient  Location: [facility] (AMB) Pain Management Facility  Visit type: Procedure  Specialty: Interventional Pain Management  Patient type: Established   Primary Reason for Visit: Interventional Pain Management Treatment. CC: Neck Pain (both arms)  Procedure:  Anesthesia, Analgesia, Anxiolysis:  Type: Palliative, Inter-Laminar, Epidural Steroid Injection Region: Posterior Cervico-thoracic Region Level: C7-T1 Laterality: Left-Sided Paramedial  Type: Local Anesthesia Local Anesthetic: Lidocaine 1% Route: Infiltration ([place]/IM) IV Access: Declined Sedation: Declined  Indication(s): Analgesia          Indications: 1. Chronic cervical radicular pain (Left)   2. Cervical spondylosis   3. Failed cervical surgery syndrome    Pain Score: Pre-procedure: 6 /10 Post-procedure: 0-No pain/10  Pre-op Assessment:  Previous date of service: 10/28/16 Service provided: Procedure Rick Carter is a 66 y.o. (year old), male patient, seen today for interventional treatment. He  has a past surgical history that includes Tonsillectomy; Lung removal, partial; and Neck surgery. His primarily concern today is the Neck Pain (both arms)  Initial Vital Signs: Blood pressure (!) 165/85, pulse 64, temperature 98.2 F (36.8 C), temperature source Oral, resp. rate 16, height _0  (1.753 m), weight 156 lb (70.8 kg), SpO2 99 %. BMI: 23.04 kg/m  Risk Assessment: Allergies: Reviewed. He is allergic to atorvastatin and varenicline.  Allergy Precautions: None required Coagulopathies: "Reviewed. None identified.  Blood-thinner therapy: None at this time Active Infection(s): Reviewed. None identified. Rick Carter is afebrile  Site Confirmation: Rick Carter was asked to confirm the procedure and laterality  before marking the site Procedure checklist: Completed Consent: Before the procedure and under the influence of no sedative(s), amnesic(s), or anxiolytics, the patient was informed of the treatment options, risks and possible complications. To fulfill our ethical and legal obligations, as recommended by the American Medical Association's Code of Ethics, I have informed the patient of my clinical impression; the nature and purpose of the treatment or procedure; the risks, benefits, and possible complications of the intervention; the alternatives, including doing nothing; the risk(s) and benefit(s) of the alternative treatment(s) or procedure(s); and the risk(s) and benefit(s) of doing nothing. The patient was provided information about the general risks and possible complications associated with the procedure. These may include, but are not limited to: failure to achieve desired goals, infection, bleeding, organ or nerve damage, allergic reactions, paralysis, and death. In addition, the patient was informed of those risks and complications associated to Spine-related procedures, such as failure to decrease pain; infection (i.e.: Meningitis, epidural or intraspinal abscess); bleeding (i.e.: epidural hematoma, subarachnoid hemorrhage, or any other type of intraspinal or peri-dural bleeding); organ or nerve damage (i.e.: Any type of peripheral nerve, nerve root, or spinal cord injury) with subsequent damage to sensory, motor, and/or autonomic systems, resulting in permanent pain, numbness, and/or weakness of one or several areas of the body; allergic reactions; (i.e.: anaphylactic reaction); and/or death. Furthermore, the patient was informed of those risks and complications associated with the medications. These include, but are not limited to: allergic reactions (i.e.: anaphylactic or anaphylactoid reaction(s)); adrenal axis suppression; blood sugar elevation that in diabetics may result in ketoacidosis or comma;  water retention that in patients with history of congestive heart failure may result in shortness of breath, pulmonary edema, and decompensation with resultant heart failure; weight gain; swelling or edema;  medication-induced neural toxicity; particulate matter embolism and blood vessel occlusion with resultant organ, and/or nervous system infarction; and/or aseptic necrosis of one or more joints. Finally, the patient was informed that Medicine is not an exact science; therefore, there is also the possibility of unforeseen or unpredictable risks and/or possible complications that may result in a catastrophic outcome. The patient indicated having understood very clearly. We have given the patient no guarantees and we have made no promises. Enough time was given to the patient to ask questions, all of which were answered to the patient's satisfaction. Rick Carter has indicated that he wanted to continue with the procedure. Attestation: I, the ordering provider, attest that I have discussed with the patient the benefits, risks, side-effects, alternatives, likelihood of achieving goals, and potential problems during recovery for the procedure that I have provided informed consent. Date: 12/11/2016; Time: 9:34 AM  Pre-Procedure Preparation:  Monitoring: As per clinic protocol. Respiration, ETCO2, SpO2, BP, heart rate and rhythm monitor placed and checked for adequate function Safety Precautions: Patient was assessed for positional comfort and pressure points before starting the procedure. Time-out: I initiated and conducted the "Time-out" before starting the procedure, as per protocol. The patient was asked to participate by confirming the accuracy of the "Time Out" information. Verification of the correct person, site, and procedure were performed and confirmed by me, the nursing staff, and the patient. "Time-out" conducted as per Joint Commission's Universal Protocol (UP.01.01.01). "Time-out" Date & Time:  12/11/2016; 1005 hrs.  Description of Procedure Process:   Position: Prone with head of the table was raised to facilitate breathing. Target Area: For Epidural Steroid injections the target is the interlaminar space, initially targeting the lower border of the superior vertebral body lamina. Approach: Paramedial approach. Area Prepped: Entire PosteriorCervical Region Prepping solution: ChloraPrep (2% chlorhexidine gluconate and 70% isopropyl alcohol) Safety Precautions: Aspiration looking for blood return was conducted prior to all injections. At no point did we inject any substances, as a needle was being advanced. No attempts were made at seeking any paresthesias. Safe injection practices and needle disposal techniques used. Medications properly checked for expiration dates. SDV (single dose vial) medications used. Description of the Procedure: Protocol guidelines were followed. The procedure needle was introduced through the skin, ipsilateral to the reported pain, and advanced to the target area. Bone was contacted and the needle walked caudad, until the lamina was cleared. The epidural space was identified using "loss-of-resistance technique" with 2-3 ml of PF-NaCl (0.9% NSS), in a 5cc LOR glass syringe. Vitals:   12/11/16 1010 12/11/16 1015 12/11/16 1020 12/11/16 1026  BP: (!) 164/78 (!) 171/83 (!) 158/70 (!) 177/86  Pulse: 64 65 65 (!) 59  Resp: _0 Temp:      TempSrc:      SpO2: 97% 100% 100% 96%  Weight:      Height:        Start Time: 1005 hrs. End Time: 1014 hrs. Materials:  Needle(s) Type: Epidural needle Gauge: 17G Length: 3.5-in Medication(s): We administered lidocaine (PF), iopamidol, and ropivacaine (PF) 5 mg/mL (0.5%). Please see chart orders for dosing details.  Imaging Guidance (Spinal):  Type of Imaging Technique: Fluoroscopy Guidance (Spinal) Indication(s): Assistance in needle guidance and placement for procedures requiring needle placement in or near  specific anatomical locations not easily accessible without such assistance. Exposure Time: Please see nurses notes. Contrast: Before injecting any contrast, we confirmed that the patient did not have an allergy to iodine, shellfish, or radiological contrast. Once  satisfactory needle placement was completed at the desired level, radiological contrast was injected. Contrast injected under live fluoroscopy. No contrast complications. See chart for type and volume of contrast used. Fluoroscopic Guidance: I was personally present during the use of fluoroscopy. "Tunnel Vision Technique" used to obtain the best possible view of the target area. Parallax error corrected before commencing the procedure. "Direction-depth-direction" technique used to introduce the needle under continuous pulsed fluoroscopy. Once target was reached, antero-posterior, oblique, and lateral fluoroscopic projection used confirm needle placement in all planes. Images permanently stored in EMR. Interpretation: I personally interpreted the imaging intraoperatively. Adequate needle placement confirmed in multiple planes. Appropriate spread of contrast into desired area was observed. No evidence of afferent or efferent intravascular uptake. No intrathecal or subarachnoid spread observed. Permanent images saved into the patient's record.  Antibiotic Prophylaxis:  Indication(s): None identified Antibiotic given: None  Post-operative Assessment:  EBL: None Complications: No immediate post-treatment complications observed by team, or reported by patient. Note: The patient tolerated the entire procedure well. A repeat set of vitals were taken after the procedure and the patient was kept under observation following institutional policy, for this type of procedure. Post-procedural neurological assessment was performed, showing return to baseline, prior to discharge. The patient was provided with post-procedure discharge instructions, including a  section on how to identify potential problems. Should any problems arise concerning this procedure, the patient was given instructions to immediately contact us, at any time, without hesitation. In any case, we plan to contact the patient by telephone for a follow-up status report regarding this interventional procedure. Comments:  No additional relevant information.  Plan of Care  Disposition: Discharge home  Discharge Date & Time: 12/11/2016; 1028 hrs.  Physician-requested Follow-up:  Return in about 2 weeks (around 12/25/2016) for Post-Procedure evaluation.  Future Appointments Date Time Provider [place]  12/24/2016 10:30 AM Milinda Pointer, MD [facility] None   Medications ordered for procedure: Meds ordered this encounter  Medications  . lidocaine (PF) (XYLOCAINE) 1 % injection 10 mL  . iopamidol (ISOVUE-M) 41 % intrathecal injection 10 mL  . dexamethasone (DECADRON) injection 10 mg  . sodium chloride flush (NS) 0.9 % injection 1 mL  . ropivacaine (PF) 5 mg/mL (0.5%) (NAROPIN) injection 0.5 mL   Medications administered: We administered lidocaine (PF), iopamidol, and ropivacaine (PF) 5 mg/mL (0.5%).  See the medical record for exact dosing, route, and time of administration.  Lab-work, Procedure(s), & Referral(s) Ordered: Orders Placed This Encounter  Procedures  . Discharge instructions  . Follow-up  . Informed Consent Details: Transcribe to consent form and obtain patient signature  . Provider attestation of informed consent for procedure/surgical case  . Verify informed consent   Imaging Ordered: Results for orders placed in visit on 08/26/16  DG C-Arm 1-60 Min-No Report   Narrative Fluoroscopy was utilized by the requesting physician.  No radiographic  interpretation.    New Prescriptions   No medications on file   Primary Care Physician: Pcp Not In System Location: [facility] Note by: Kathlen Brunswick. Dossie Arbour, M.D, DABA,  DABAPM, DABPM, DABIPP, FIPP Date: 12/11/2016; Time: 11:11 AM  Disclaimer:  Medicine is not an Chief Strategy Officer. The only guarantee in medicine is that nothing is guaranteed. It is important to note that the decision to proceed with this intervention was based on the information collected from the patient. The Data and conclusions were drawn from the patient's questionnaire, the interview, and the physical examination. Because the information was provided in large  part by the patient, it cannot be guaranteed that it has not been purposely or unconsciously manipulated. Every effort has been made to obtain as much relevant data as possible for this evaluation. It is important to note that the conclusions that lead to this procedure are derived in large part from the available data. Always take into account that the treatment will also be dependent on availability of resources and existing treatment guidelines, considered by other Pain Management Practitioners as being common knowledge and practice, at the time of the intervention. For Medico-Legal purposes, it is also important to point out that variation in procedural techniques and pharmacological choices are the acceptable norm. The indications, contraindications, technique, and results of the above procedure should only be interpreted and judged by a Board-Certified Interventional Pain Specialist with extensive familiarity and expertise in the same exact procedure and technique. Attempts at providing opinions without similar or greater experience and expertise than that of the treating physician will be considered as inappropriate and unethical, and shall result in a formal complaint to the state medical board and applicable specialty societies.  Instructions provided at this appointment: Patient Instructions  Pain Management Discharge Instructions  General Discharge Instructions :  If you need to reach your doctor call: Monday-Friday 8:00 am - 4:00 pm at  [phone] or toll free [phone].  After clinic hours [phone] to have operator reach doctor.  Bring all of your medication bottles to all your appointments in the pain clinic.  To cancel or reschedule your appointment with Pain Management please remember to call 24 hours in advance to avoid a fee.  Refer to the educational materials which you have been given on: General Risks, I had my Procedure. Discharge Instructions, Post Sedation.  Post Procedure Instructions:  The drugs you were given will stay in your system until tomorrow, so for the next 24 hours you should not drive, make any legal decisions or drink any alcoholic beverages.  You may eat anything you prefer, but it is better to start with liquids then soups and crackers, and gradually work up to solid foods.  Please notify your doctor immediately if you have any unusual bleeding, trouble breathing or pain that is not related to your normal pain.  Depending on the type of procedure that was done, some parts of your body may feel week and/or numb.  This usually clears up by tonight or the next day.  Walk with the use of an assistive device or accompanied by an adult for the 24 hours.  You may use ice on the affected area for the first 24 hours.  Put ice in a Ziploc bag and cover with a towel and place against area 15 minutes on 15 minutes off.  You may switch to heat after 24 hours.

## 2016-12-12 ENCOUNTER — Telehealth: Payer: Self-pay | Admitting: *Deleted

## 2016-12-12 NOTE — Telephone Encounter (Signed)
Caregiver states that he is still sleeping . Instructed to let him know we called and to call us for any problems.

## 2016-12-24 ENCOUNTER — Ambulatory Visit: Payer: Medicare Other | Admitting: Pain Medicine

## 2016-12-31 ENCOUNTER — Ambulatory Visit: Payer: Medicare Other | Attending: Pain Medicine | Admitting: Pain Medicine

## 2016-12-31 ENCOUNTER — Encounter: Payer: Self-pay | Admitting: Pain Medicine

## 2016-12-31 VITALS — Ht 69.0 in | Wt 156.0 lb

## 2016-12-31 DIAGNOSIS — G894 Chronic pain syndrome: Secondary | ICD-10-CM

## 2016-12-31 DIAGNOSIS — Z7984 Long term (current) use of oral hypoglycemic drugs: Secondary | ICD-10-CM | POA: Insufficient documentation

## 2016-12-31 DIAGNOSIS — G8929 Other chronic pain: Secondary | ICD-10-CM

## 2016-12-31 DIAGNOSIS — X58XXXA Exposure to other specified factors, initial encounter: Secondary | ICD-10-CM | POA: Insufficient documentation

## 2016-12-31 DIAGNOSIS — M7918 Myalgia, other site: Secondary | ICD-10-CM

## 2016-12-31 DIAGNOSIS — M1288 Other specific arthropathies, not elsewhere classified, other specified site: Secondary | ICD-10-CM | POA: Diagnosis not present

## 2016-12-31 DIAGNOSIS — M5441 Lumbago with sciatica, right side: Secondary | ICD-10-CM

## 2016-12-31 DIAGNOSIS — Z888 Allergy status to other drugs, medicaments and biological substances status: Secondary | ICD-10-CM | POA: Insufficient documentation

## 2016-12-31 DIAGNOSIS — I10 Essential (primary) hypertension: Secondary | ICD-10-CM | POA: Diagnosis not present

## 2016-12-31 DIAGNOSIS — I739 Peripheral vascular disease, unspecified: Secondary | ICD-10-CM | POA: Insufficient documentation

## 2016-12-31 DIAGNOSIS — F1190 Opioid use, unspecified, uncomplicated: Secondary | ICD-10-CM

## 2016-12-31 DIAGNOSIS — K5900 Constipation, unspecified: Secondary | ICD-10-CM | POA: Insufficient documentation

## 2016-12-31 DIAGNOSIS — Z811 Family history of alcohol abuse and dependence: Secondary | ICD-10-CM | POA: Insufficient documentation

## 2016-12-31 DIAGNOSIS — Z79891 Long term (current) use of opiate analgesic: Secondary | ICD-10-CM

## 2016-12-31 DIAGNOSIS — T39395A Adverse effect of other nonsteroidal anti-inflammatory drugs [NSAID], initial encounter: Secondary | ICD-10-CM | POA: Diagnosis not present

## 2016-12-31 DIAGNOSIS — Z841 Family history of disorders of kidney and ureter: Secondary | ICD-10-CM | POA: Diagnosis not present

## 2016-12-31 DIAGNOSIS — Z7982 Long term (current) use of aspirin: Secondary | ICD-10-CM | POA: Insufficient documentation

## 2016-12-31 DIAGNOSIS — G8912 Acute post-thoracotomy pain: Secondary | ICD-10-CM

## 2016-12-31 DIAGNOSIS — F17200 Nicotine dependence, unspecified, uncomplicated: Secondary | ICD-10-CM | POA: Diagnosis not present

## 2016-12-31 DIAGNOSIS — M961 Postlaminectomy syndrome, not elsewhere classified: Secondary | ICD-10-CM

## 2016-12-31 DIAGNOSIS — H3530 Unspecified macular degeneration: Secondary | ICD-10-CM | POA: Insufficient documentation

## 2016-12-31 DIAGNOSIS — K2960 Other gastritis without bleeding: Secondary | ICD-10-CM | POA: Diagnosis not present

## 2016-12-31 DIAGNOSIS — T402X5A Adverse effect of other opioids, initial encounter: Secondary | ICD-10-CM

## 2016-12-31 DIAGNOSIS — M5116 Intervertebral disc disorders with radiculopathy, lumbar region: Secondary | ICD-10-CM | POA: Insufficient documentation

## 2016-12-31 DIAGNOSIS — M5416 Radiculopathy, lumbar region: Secondary | ICD-10-CM | POA: Diagnosis not present

## 2016-12-31 DIAGNOSIS — E785 Hyperlipidemia, unspecified: Secondary | ICD-10-CM | POA: Diagnosis not present

## 2016-12-31 DIAGNOSIS — M47816 Spondylosis without myelopathy or radiculopathy, lumbar region: Secondary | ICD-10-CM

## 2016-12-31 DIAGNOSIS — M159 Polyosteoarthritis, unspecified: Secondary | ICD-10-CM

## 2016-12-31 DIAGNOSIS — R209 Unspecified disturbances of skin sensation: Secondary | ICD-10-CM | POA: Insufficient documentation

## 2016-12-31 DIAGNOSIS — J449 Chronic obstructive pulmonary disease, unspecified: Secondary | ICD-10-CM | POA: Insufficient documentation

## 2016-12-31 DIAGNOSIS — K5903 Drug induced constipation: Secondary | ICD-10-CM

## 2016-12-31 DIAGNOSIS — M150 Primary generalized (osteo)arthritis: Secondary | ICD-10-CM

## 2016-12-31 DIAGNOSIS — I4891 Unspecified atrial fibrillation: Secondary | ICD-10-CM | POA: Insufficient documentation

## 2016-12-31 DIAGNOSIS — M797 Fibromyalgia: Secondary | ICD-10-CM

## 2016-12-31 DIAGNOSIS — M791 Myalgia: Secondary | ICD-10-CM

## 2016-12-31 DIAGNOSIS — M792 Neuralgia and neuritis, unspecified: Secondary | ICD-10-CM | POA: Diagnosis not present

## 2016-12-31 MED ORDER — IBUPROFEN 800 MG PO TABS: 800.0000 mg | ORAL_TABLET | Freq: Three times a day (TID) | ORAL | 0 refills | Status: DC | PRN

## 2016-12-31 MED ORDER — OXYCODONE-ACETAMINOPHEN 5-325 MG PO TABS: 1.0000 | ORAL_TABLET | Freq: Four times a day (QID) | ORAL | 0 refills | Status: DC | PRN

## 2016-12-31 MED ORDER — DOCUSATE SODIUM 100 MG PO CAPS: 100.0000 mg | ORAL_CAPSULE | Freq: Every day | ORAL | 0 refills | Status: DC

## 2016-12-31 MED ORDER — PREGABALIN 150 MG PO CAPS: 150.0000 mg | ORAL_CAPSULE | Freq: Two times a day (BID) | ORAL | 0 refills | Status: DC

## 2016-12-31 MED ORDER — METHOCARBAMOL 750 MG PO TABS: 750.0000 mg | ORAL_TABLET | Freq: Three times a day (TID) | ORAL | 2 refills | Status: DC | PRN

## 2016-12-31 NOTE — Progress Notes (Signed)
Nursing Pain Medication Assessment:  Safety precautions to be maintained throughout the outpatient stay will include: orient to surroundings, keep bed in low position, maintain call bell within reach at all times, provide assistance with transfer out of bed and ambulation.  Medication Inspection Compliance: Pill count conducted under aseptic conditions, in front of the patient. Neither the pills nor the bottle was removed from the patient's sight at any time. Once count was completed pills were immediately returned to the patient in their original bottle.  Medication: Oxycodone/APAP Pill/Patch Count: 27 of 120 pills remain Bottle Appearance: Standard pharmacy container. Clearly labeled. Filled Date: 02/ 24 / 2018 Last Medication intake:  Today

## 2016-12-31 NOTE — Progress Notes (Signed)
Patient's Name: Rick HOLDERMAN Sr.  MRN: [id number]  Referring Provider: No ref. provider found  DOB: 11-26-1950  PCP: Pcp Not In System  DOS: 12/31/2016  Note by: Shalom Ware A. Dossie Arbour, MD  Service setting: Ambulatory outpatient  Specialty: Interventional Pain Management  Location: [facility] (AMB) Pain Management Facility    Patient type: Established   Primary Reason(s) for Visit: Encounter for prescription drug management & post-procedure evaluation of chronic illness with mild to moderate exacerbation(Level of risk: moderate) CC: Back Pain (low)  HPI  Rick Carter is a 66 y.o. year old, male patient, who comes today for a post-procedure evaluation and medication management. 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[place]); Atrial fibrillation ([place]); Bipolar affective disorder ([place]); Chronic obstructive pulmonary disease ([place]); Degeneration of intervertebral disc of cervical region; Colon, diverticulosis; Acid reflux; Essential (primary) hypertension; HLD (hyperlipidemia); Breath shortness; Compulsive tobacco user syndrome; Diverticular disease of large intestine; Post-thoracotomy pain syndrome; Encounter for therapeutic drug level monitoring; Long term current use of opiate analgesic; Long term prescription opiate use; Uncomplicated opioid dependence ([place]); Opiate use (30 MME/Day); Fibromyalgia; Chronic pain syndrome; Lumbar facet syndrome (Location of Primary Source of Pain) (Bilateral) (R>Carter); History of alcoholism ([place]); Chronic low back pain (Location of Primary Source of Pain) (midline); Lumbar spondylosis; Cervical spondylosis; Failed cervical surgery syndrome; Nicotine dependence; Tobacco abuse; Substance Use Disorder Risk: High; History of right-sided partial pneumonectomy; Atherosclerotic peripheral vascular disease ([place]); Musculoskeletal pain; Neurogenic pain; Neuropathic pain; Opioid-induced constipation (OIC); Osteoarthritis; NSAID induced gastritis; Thoracic spine pain; Thoracic radicular  pain (Right); Chronic lumbar radicular pain (L5/S1 dermatome) (Location of Secondary source of pain) (Right); Lumbar foraminal stenosis (L5-S1) (Right); Lumbar facet arthropathy (Bilateral) (R>Carter); Lumbar disc protrusion (L3-4) (Right); Chronic cervical radicular pain (Left); Tumor of parotid gland; Trigger finger, left ring finger; and Disturbance of skin sensation on his problem list. His primarily concern today is the Back Pain (low)  Pain Assessment: Self-Reported Pain Score: 4 /10 Clinically the patient looks like a 2/10 Reported level is inconsistent with clinical observations. Information on the proper use of the pain scale provided to the patient today Pain Type: Chronic pain Pain Location: Back Pain Orientation: Lower, Mid Pain Descriptors / Indicators: Aching, Dull, Radiating Pain Frequency: Constant  Rick Carter was last seen on 12/11/2016 for a procedure. During today's appointment we reviewed Rick Carter's post-procedure results, as well as his outpatient medication regimen.  Further details on both, my assessment(s), as well as the proposed treatment plan, please see below.  Controlled Substance Pharmacotherapy Assessment REMS (Risk Evaluation and Mitigation Strategy)  Analgesic:Oxycodone/APAP 5/325 one every 6 hours (20 mg/day) MME/day:30 mg/day  Zenovia Jarred, RN  12/31/2016  9:37 AM  Signed Nursing Pain Medication Assessment:  Safety precautions to be maintained throughout the outpatient stay will include: orient to surroundings, keep bed in low position, maintain call bell within reach at all times, provide assistance with transfer out of bed and ambulation.  Medication Inspection Compliance: Pill count conducted under aseptic conditions, in front of the patient. Neither the pills nor the bottle was removed from the patient's sight at any time. Once count was completed pills were immediately returned to the patient in their original bottle.  Medication: Oxycodone/APAP Pill/Patch  Count: 27 of 120 pills remain Bottle Appearance: Standard pharmacy container. Clearly labeled. Filled Date: 02/ 24 / 2018 Last Medication intake:  Today   Pharmacokinetics: Liberation and absorption (onset of action): WNL Distribution (time to peak effect): WNL Metabolism and excretion (duration of action): WNL  Pharmacodynamics: Desired effects: Analgesia: Rick Carter reports >50% benefit. Functional ability: Patient reports that medication allows him to accomplish basic ADLs Clinically meaningful improvement in function (CMIF): Sustained CMIF goals met Perceived effectiveness: Described as relatively effective, allowing for increase in activities of daily living (ADL) Undesirable effects: Side-effects or Adverse reactions: None reported Monitoring: [place] PMP: Online review of the past 55-monthperiod conducted. Compliant with practice rules and regulations List of all UDS test(s) done:  Lab Results  Component Value Date   TOXASSSELUR FINAL 04/01/2016   TOXASSSELUR FINAL 12/27/2015   TOXASSSELUR FINAL 10/31/2015   TOXASSSELUR FINAL 10/02/2015   Last UDS on record: ToxAssure Select 13  Date Value Ref Range Status  04/01/2016 FINAL  Final    Comment:    ==================================================================== TOXASSURE SELECT 13 (MW) ==================================================================== Test                             Result       Flag       Units Drug Present and Declared for Prescription Verification   Oxycodone                      717          EXPECTED   ng/mg creat   Oxymorphone                    2578         EXPECTED   ng/mg creat   Noroxycodone                   2400         EXPECTED   ng/mg creat   Noroxymorphone                 737          EXPECTED   ng/mg creat    Sources of oxycodone are scheduled prescription medications.    Oxymorphone, noroxycodone, and noroxymorphone are expected    metabolites of oxycodone. Oxymorphone is also  available as a    scheduled prescription medication. ==================================================================== Test                      Result    Flag   Units      Ref Range   Creatinine              65               mg/dL      >=20 ==================================================================== Declared Medications:  The flagging and interpretation on this report are based on the  following declared medications.  Unexpected results may arise from  inaccuracies in the declared medications.  **Note: The testing scope of this panel includes these medications:  Oxycodone (Percocet)  **Note: The testing scope of this panel does not include following  reported medications:  Acetaminophen (Percocet)  Aclidinium  Aclidinium (Tudorza)  Albuterol  Albuterol (Ventolin)  Aspirin (Aspirin 81)  Calcium Carbonate (Calcium Carb/Vitamin D)  Docusate (Colace)  Fluticasone (Advair)  Hydrochlorothiazide (Microzide)  Hydroxyzine (Atarax)  Ibuprofen (Advil)  Loratadine (Claritin)  Metformin (Glucophage)  Pantoprazole (Protonix)  Pregabalin (Lyrica)  Salmeterol (Advair)  Trazodone (Desyrel)  Vitamin D  Vitamin D (Calcium Carb/Vitamin D) ==================================================================== For clinical consultation, please call ([phone] ====================================================================    UDS interpretation: Compliant          Medication Assessment Form: Reviewed. Patient  indicates being compliant with therapy Treatment compliance: Compliant Risk Assessment Profile: Aberrant behavior: See prior evaluations. None observed or detected today Comorbid factors increasing risk of overdose: See prior notes. No additional risks detected today Risk of substance use disorder (SUD): Low Opioid Risk Tool (ORT) Total Score: 5  Interpretation Table:  Score <3 = Low Risk for SUD  Score between 4-7 = Moderate Risk for SUD  Score >8 = High Risk  for Opioid Abuse   Risk Mitigation Strategies:  Patient Counseling: Covered Patient-Prescriber Agreement (PPA): Present and active  Notification to other healthcare providers: Done  Pharmacologic Plan: No change in therapy, at this time  Post-Procedure Assessment  12/11/2016 Procedure: Palliative left cervical epidural steroid injection under fluoroscopic guidance, no sedation Post-procedure pain score: 0/10 (100% relief) Influential Factors: BMI: 23.04 kg/m Intra-procedural challenges: None observed Assessment challenges: None detected         Post-procedural side-effects, adverse reactions, or complications: None reported Reported issues: None  Sedation: No sedation used. When no sedatives are used, the analgesic levels obtained are directly associated to the effectiveness of the local anesthetics. However, when sedation is provided, the level of analgesia obtained during the initial 1 hour following the intervention, is believed to be the result of a combination of factors. These factors may include, but are not limited to: 1. The effectiveness of the local anesthetics used. 2. The effects of the analgesic(s) and/or anxiolytic(s) used. 3. The degree of discomfort experienced by the patient at the time of the procedure. 4. The patients ability and reliability in recalling and recording the events. 5. The presence and influence of possible secondary gains and/or psychosocial factors. Reported result: Relief experienced during the 1st hour after the procedure: 100 % (Ultra-Short Term Relief) Interpretative annotation: No Analgesic or Anxiolytic given, therefore benefits are completely due to Local Anesthetics.          Effects of local anesthetic: The analgesic effects attained during this period are directly associated to the localized infiltration of local anesthetics and therefore cary significant diagnostic value as to the etiological location, or anatomical origin, of the pain.  Expected duration of relief is directly dependent on the pharmacodynamics of the local anesthetic used. Long-acting (4-6 hours) anesthetics used.  Reported result: Relief during the next 4 to 6 hour after the procedure: 50 % (Short-Term Relief) Interpretative annotation: Complete relief would suggest area to be the source of the pain.          Long-term benefit: Defined as the period of time past the expected duration of local anesthetics. With the possible exception of prolonged sympathetic blockade from the local anesthetics, benefits during this period are typically attributed to, or associated with, other factors such as analgesic sensory neuropraxia, antiinflammatory effects, or beneficial biochemical changes provided by agents other than the local anesthetics Reported result: Extended relief following procedure: 75 % (ongoing) (Long-Term Relief) Interpretative annotation: Good relief. This could suggest inflammation to be a significant component in the etiology to the pain.          Current benefits: Defined as persistent relief that continues at this point in time.   Reported results: Treated area: 75 % Mr. Hatchel reports improvement in function Interpretative annotation: Ongoing benefits would suggest effective palliative intervention  Interpretation: Results would suggest a successful therapeutic intervention.          Laboratory Chemistry  Inflammation Markers Lab Results  Component Value Date   CRP 0.8 10/10/2015   ESRSEDRATE 1 10/10/2015   (CRP:  Acute Phase) (ESR: Chronic Phase) Renal Function Markers Lab Results  Component Value Date   BUN 13 10/10/2015   CREATININE 0.82 10/10/2015   GFRAA >60 10/10/2015   GFRNONAA >60 10/10/2015   Hepatic Function Markers Lab Results  Component Value Date   AST 21 10/10/2015   ALT 16 (Carter) 10/10/2015   ALBUMIN 4.5 10/10/2015   ALKPHOS 49 10/10/2015   Electrolytes Lab Results  Component Value Date   NA 136 10/10/2015   K 3.5  10/10/2015   CL 98 (Carter) 10/10/2015   CALCIUM 9.8 10/10/2015   MG 2.3 10/10/2015   Neuropathy Markers No results found for: [id number] Bone Pathology Markers Lab Results  Component Value Date   ALKPHOS 49 10/10/2015   25OHVITD1 51 10/10/2015   25OHVITD2 <1.0 10/10/2015   25OHVITD3 50 10/10/2015   CALCIUM 9.8 10/10/2015   Coagulation Parameters No results found for: INR, LABPROT, APTT, PLT Cardiovascular Markers No results found for: BNP, HGB, HCT Note: Lab results reviewed.  Recent Diagnostic Imaging Review  Dg C-arm 61-120 Min-no Report  Result Date: 12/11/2016 Fluoroscopy was utilized by the requesting physician.  No radiographic interpretation.   Note: Imaging results reviewed.          Meds  The patient has a current medication list which includes the following prescription(s): aclidinium bromide, advair diskus, albuterol, albuterol, aspirin ec, calcium carb-cholecalciferol, cholecalciferol, diphenhydramine, docusate sodium, fluticasone-salmeterol, hydrochlorothiazide, hydroxyzine, ibuprofen, ipratropium-albuterol, loratadine, metformin, methocarbamol, multiple vitamins-minerals, oxycodone-acetaminophen, oxycodone-acetaminophen, oxycodone-acetaminophen, pantoprazole, pregabalin, sildenafil, sodium chloride hypertonic, trazodone, and tudorza pressair.  Current Outpatient Prescriptions on File Prior to Visit  Medication Sig  . Aclidinium Bromide (TUDORZA PRESSAIR IN) Inhale 40 mcg into the lungs 2 (two) times daily.  Marland Kitchen ADVAIR DISKUS 250-50 MCG/DOSE AEPB INHALE 1 PUFF 2 (TWO) TIMES A DAY.  Marland Kitchen aspirin EC 81 MG tablet Take 81 mg by mouth daily.  . Calcium Carb-Cholecalciferol 600-800 MG-UNIT TABS TAKE 1 TABLET BY MOUTH 2 TIMES A DAY  . cholecalciferol (VITAMIN D) 1000 UNITS tablet Take 2,000 Units by mouth 2 (two) times daily.   . diphenhydrAMINE (BENADRYL) 25 mg capsule Take 25 mg by mouth 2 (two) times daily.   . Fluticasone-Salmeterol (ADVAIR) 500-50 MCG/DOSE AEPB Inhale 1 puff  into the lungs 2 (two) times daily.  . hydrochlorothiazide (MICROZIDE) 12.5 MG capsule TAKE ONE CAPSULE BY MOUTH FOR HIGH BLOOD PRESSURE  . hydrOXYzine (ATARAX/VISTARIL) 25 MG tablet Take 25 mg by mouth as needed.  Marland Kitchen ipratropium-albuterol (DUONEB) 0.5-2.5 (3) MG/3ML SOLN Take 3 mLs by nebulization every 6 (six) hours as needed.  . loratadine (CLARITIN) 10 MG tablet Take 10 mg by mouth.   . metFORMIN (GLUCOPHAGE) 500 MG tablet Take 500 mg by mouth 2 (two) times daily with a meal.  . Multiple Vitamins-Minerals (PRESERVISION AREDS 2 PO) Take 1 tablet by mouth 2 (two) times daily.  . pantoprazole (PROTONIX) 40 MG tablet Take 40 mg by mouth.  . sodium chloride HYPERTONIC 3 % nebulizer solution INHALE 4 ML BY NEBULIZATION TWICE A DAY  . traZODone (DESYREL) 50 MG tablet Take 50 mg by mouth at bedtime. Reported on 02/20/2016   No current facility-administered medications on file prior to visit.    ROS  Constitutional: Denies any fever or chills Gastrointestinal: No reported hemesis, hematochezia, vomiting, or acute GI distress Musculoskeletal: Denies any acute onset joint swelling, redness, loss of ROM, or weakness Neurological: No reported episodes of acute onset apraxia, aphasia, dysarthria, agnosia, amnesia, paralysis, loss of coordination, or loss of consciousness  Allergies  Rick Carter is allergic to atorvastatin and varenicline.  PFSH  Drug: Rick Carter  has no drug history on file. Alcohol:  reports that he does not drink alcohol. Tobacco:  reports that he has been smoking Cigarettes.  He has a 51.00 pack-year smoking history. He has never used smokeless tobacco. Medical:  has a past medical history of Anxiety; COPD (chronic obstructive pulmonary disease) ([place]); Depression; Hernia of abdominal wall; History of alcoholism ([place]) (08/08/2015); History of pneumonectomy (08/08/2015); Hypertension; Macular degeneration (08/26/2016); and Tumor cells. Family: family history includes Alcohol abuse in  his father; Kidney disease in his mother.  Past Surgical History:  Procedure Laterality Date  . LUNG REMOVAL, PARTIAL    . NECK SURGERY     plate in neck- 11 years ago  . TONSILLECTOMY     Constitutional Exam  General appearance: Well nourished, well developed, and well hydrated. In no apparent acute distress Vitals:   12/31/16 0925  Weight: 156 lb (70.8 kg)  Height: _0  (1.753 m)   BMI Assessment: Estimated body mass index is 23.04 kg/m as calculated from the following:   Height as of this encounter: _1  (1.753 m).   Weight as of this encounter: 156 lb (70.8 kg).  BMI interpretation table: BMI level Category Range association with higher incidence of chronic pain  <18 kg/m2 Underweight   18.5-24.9 kg/m2 Ideal body weight   25-29.9 kg/m2 Overweight Increased incidence by 20%  30-34.9 kg/m2 Obese (Class I) Increased incidence by 68%  35-39.9 kg/m2 Severe obesity (Class II) Increased incidence by 136%  >40 kg/m2 Extreme obesity (Class III) Increased incidence by 254%   BMI Readings from Last 4 Encounters:  12/31/16 23.04 kg/m  12/11/16 23.04 kg/m  10/28/16 22.71 kg/m  09/26/16 22.89 kg/m   Wt Readings from Last 4 Encounters:  12/31/16 156 lb (70.8 kg)  12/11/16 156 lb (70.8 kg)  10/28/16 156 lb (70.8 kg)  09/26/16 155 lb (70.3 kg)  Psych/Mental status: Alert, oriented x 3 (person, place, & time)       Eyes: PERLA Respiratory: No evidence of acute respiratory distress  Cervical Spine Exam  Inspection: No masses, redness, or swelling Alignment: Symmetrical Functional ROM: Improved after treatment Stability: No instability detected Muscle strength & Tone: Functionally intact Sensory: Unimpaired Palpation: Non-contributory  Upper Extremity (UE) Exam    Side: Right upper extremity  Side: Left upper extremity  Inspection: No masses, redness, swelling, or asymmetry. No contractures  Inspection: No masses, redness, swelling, or asymmetry. No contractures    Functional ROM: Unrestricted ROM          Functional ROM: Unrestricted ROM          Muscle strength & Tone: Functionally intact  Muscle strength & Tone: Functionally intact  Sensory: Unimpaired  Sensory: Unimpaired  Palpation: Euthermic  Palpation: Euthermic  Specialized Test(s): Deferred         Specialized Test(s): Deferred          Thoracic Spine Exam  Inspection: No masses, redness, or swelling Alignment: Symmetrical Functional ROM: Unrestricted ROM Stability: No instability detected Sensory: Unimpaired Muscle strength & Tone: Functionally intact Palpation: Non-contributory  Lumbar Spine Exam  Inspection: No masses, redness, or swelling Alignment: Symmetrical Functional ROM: Improved after treatment Stability: No instability detected Muscle strength & Tone: Functionally intact Sensory: Unimpaired Palpation: Non-contributory Provocative Tests: Lumbar Hyperextension and rotation test: evaluation deferred today       Patrick's Maneuver: evaluation deferred today  Gait & Posture Assessment  Ambulation: Unassisted Gait: Relatively normal for age and body habitus Posture: WNL   Lower Extremity Exam    Side: Right lower extremity  Side: Left lower extremity  Inspection: No masses, redness, swelling, or asymmetry. No contractures  Inspection: No masses, redness, swelling, or asymmetry. No contractures  Functional ROM: Unrestricted ROM          Functional ROM: Unrestricted ROM          Muscle strength & Tone: Functionally intact  Muscle strength & Tone: Functionally intact  Sensory: Unimpaired  Sensory: Unimpaired  Palpation: No palpable anomalies  Palpation: No palpable anomalies   Assessment  Primary Diagnosis & Pertinent Problem List: The primary encounter diagnosis was Chronic pain syndrome. Diagnoses of Chronic low back pain (Location of Primary Source of Pain) (midline), Chronic lumbar radicular pain (L5/S1 dermatome) (Location of Secondary source of pain)  (Right), Lumbar facet syndrome (Location of Primary Source of Pain) (Bilateral) (R>Carter), Failed cervical surgery syndrome, Post-thoracotomy pain syndrome, Long term prescription opiate use, Opiate use (30 MME/Day), NSAID induced gastritis, Opioid-induced constipation (OIC), Primary osteoarthritis involving multiple joints, Neurogenic pain, Neuropathic pain, Fibromyalgia, Musculoskeletal pain, and Disturbance of skin sensation were also pertinent to this visit.  Status Diagnosis  Controlled Controlled Controlled 1. Chronic pain syndrome   2. Chronic low back pain (Location of Primary Source of Pain) (midline)   3. Chronic lumbar radicular pain (L5/S1 dermatome) (Location of Secondary source of pain) (Right)   4. Lumbar facet syndrome (Location of Primary Source of Pain) (Bilateral) (R>Carter)   5. Failed cervical surgery syndrome   6. Post-thoracotomy pain syndrome   7. Long term prescription opiate use   8. Opiate use (30 MME/Day)   9. NSAID induced gastritis   10. Opioid-induced constipation (OIC)   11. Primary osteoarthritis involving multiple joints   12. Neurogenic pain   13. Neuropathic pain   14. Fibromyalgia   15. Musculoskeletal pain   16. Disturbance of skin sensation      Plan of Care  Pharmacotherapy (Medications Ordered): Meds ordered this encounter  Medications  . oxyCODONE-acetaminophen (PERCOCET/ROXICET) 5-325 MG tablet    Sig: Take 1 tablet by mouth every 6 (six) hours as needed for severe pain.    Dispense:  120 tablet    Refill:  0    Do not place this medication, or any other prescription from our practice, on "Automatic Refill". Patient may have prescription filled one day early if pharmacy is closed on scheduled refill date. Do not fill until: 03/01/17 To last until: 03/31/17  . oxyCODONE-acetaminophen (PERCOCET/ROXICET) 5-325 MG tablet    Sig: Take 1 tablet by mouth every 6 (six) hours as needed for severe pain.    Dispense:  120 tablet    Refill:  0    Do not  place this medication, or any other prescription from our practice, on "Automatic Refill". Patient may have prescription filled one day early if pharmacy is closed on scheduled refill date. Do not fill until: 12/31/16 To last until: 01/30/17  . oxyCODONE-acetaminophen (PERCOCET/ROXICET) 5-325 MG tablet    Sig: Take 1 tablet by mouth every 6 (six) hours as needed for severe pain.    Dispense:  120 tablet    Refill:  0    Do not place this medication, or any other prescription from our practice, on "Automatic Refill". Patient may have prescription filled one day early if pharmacy is closed on scheduled refill date. Do not fill until: 01/30/17 To last  until: 03/01/17  . pregabalin (LYRICA) 150 MG capsule    Sig: Take 1 capsule (150 mg total) by mouth 2 (two) times daily.    Dispense:  180 capsule    Refill:  0    Do not place this medication, or any other prescription from our practice, on "Automatic Refill". Patient may have prescription filled one day early if pharmacy is closed on scheduled refill date.  . docusate sodium (COLACE) 100 MG capsule    Sig: Take 1 capsule (100 mg total) by mouth daily.    Dispense:  90 capsule    Refill:  0    Do not place this medication, or any other prescription from our practice, on "Automatic Refill". Patient may have prescription filled one day early if pharmacy is closed on scheduled refill date.  Marland Kitchen ibuprofen (ADVIL,MOTRIN) 800 MG tablet    Sig: Take 1 tablet (800 mg total) by mouth every 8 (eight) hours as needed for moderate pain.    Dispense:  270 tablet    Refill:  0    Do not place this medication, or any other prescription from our practice, on "Automatic Refill". Patient may have prescription filled one day early if pharmacy is closed on scheduled refill date.  . methocarbamol (ROBAXIN) 750 MG tablet    Sig: Take 1 tablet (750 mg total) by mouth every 8 (eight) hours as needed for muscle spasms.    Dispense:  90 tablet    Refill:  2    Do not  place this medication, or any other prescription from our practice, on "Automatic Refill". Patient may have prescription filled one day early if pharmacy is closed on scheduled refill date.   New Prescriptions   METHOCARBAMOL (ROBAXIN) 750 MG TABLET    Take 1 tablet (750 mg total) by mouth every 8 (eight) hours as needed for muscle spasms.   Medications administered today: Rick Carter had no medications administered during this visit. Lab-work, procedure(s), and/or referral(s): Orders Placed This Encounter  Procedures  . ToxASSURE Select 13 (MW), Urine  . Comprehensive metabolic panel  . C-reactive protein  . Magnesium  . Sedimentation rate  . Vitamin B12  . 25-Hydroxyvitamin D Lcms D2+D3   Imaging and/or referral(s): None  Interventional therapies: Planned, scheduled, and/or pending:   Not at this time.   Considering:   Palliative bilateral lumbar facet block  Palliative left-sided cervical epidural steroid injection  Palliative right-sided L5-S1 lumbar epidural steroid injection  Diagnostic bilateral cervical facet block  Palliative Right-sided T8-9 thoracic epidural steroid injection    Palliative PRN treatment(s):   Palliative bilateral lumbar facet block  Palliative left-sided cervical epidural steroid injection  Palliative right-sided L5-S1 lumbar epidural steroid injection    Provider-requested follow-up: Return in about 3 months (around 04/02/2017) for (MD) Med-Mgmt, in addition, (PRN) procedure.  Future Appointments Date Time Provider [place]  03/27/2017 10:00 AM Milinda Pointer, MD [facility] None   Primary Care Physician: Pcp Not In System Location: [facility] Note by: Kathlen Brunswick. Dossie Arbour, M.D, DABA, DABAPM, DABPM, DABIPP, FIPP Date: 12/31/2016; Time: 3:47 PM  Pain Score Disclaimer: We use the NRS-11 scale. This is a self-reported, subjective measurement of pain severity with only modest accuracy. It is used primarily  to identify changes within a particular patient. It must be understood that outpatient pain scales are significantly less accurate that those used for research, where they can be applied under ideal controlled circumstances with minimal exposure to variables. In reality, the  score is likely to be a combination of pain intensity and pain affect, where pain affect describes the degree of emotional arousal or changes in action readiness caused by the sensory experience of pain. Factors such as social and work situation, setting, emotional state, anxiety levels, expectation, and prior pain experience may influence pain perception and show large inter-individual differences that may also be affected by time variables.  Patient instructions provided during this appointment: Patient Instructions  Steps to Quit Smoking Smoking tobacco can be bad for your health. It can also affect almost every organ in your body. Smoking puts you and people around you at risk for many serious long-lasting (chronic) diseases. Quitting smoking is hard, but it is one of the best things that you can do for your health. It is never too late to quit. What are the benefits of quitting smoking? When you quit smoking, you lower your risk for getting serious diseases and conditions. They can include:  Lung cancer or lung disease.  Heart disease.  Stroke.  Heart attack.  Not being able to have children (infertility).  Weak bones (osteoporosis) and broken bones (fractures). If you have coughing, wheezing, and shortness of breath, those symptoms may get better when you quit. You may also get sick less often. If you are pregnant, quitting smoking can help to lower your chances of having a baby of low birth weight. What can I do to help me quit smoking? Talk with your doctor about what can help you quit smoking. Some things you can do (strategies) include:  Quitting smoking totally, instead of slowly cutting back how much you smoke  over a period of time.  Going to in-person counseling. You are more likely to quit if you go to many counseling sessions.  Using resources and support systems, such as:  Online chats with a Social worker.  Phone quitlines.  Printed Furniture conservator/restorer.  Support groups or group counseling.  Text messaging programs.  Mobile phone apps or applications.  Taking medicines. Some of these medicines may have nicotine in them. If you are pregnant or breastfeeding, do not take any medicines to quit smoking unless your doctor says it is okay. Talk with your doctor about counseling or other things that can help you. Talk with your doctor about using more than one strategy at the same time, such as taking medicines while you are also going to in-person counseling. This can help make quitting easier. What things can I do to make it easier to quit? Quitting smoking might feel very hard at first, but there is a lot that you can do to make it easier. Take these steps:  Talk to your family and friends. Ask them to support and encourage you.  Call phone quitlines, reach out to support groups, or work with a Social worker.  Ask people who smoke to not smoke around you.  Avoid places that make you Carter (trigger) to smoke, such as:  Bars.  Parties.  Smoke-break areas at work.  Spend time with people who do not smoke.  Lower the stress in your life. Stress can make you Carter to smoke. Try these things to help your stress:  Getting regular exercise.  Deep-breathing exercises.  Yoga.  Meditating.  Doing a body scan. To do this, close your eyes, focus on one area of your body at a time from head to toe, and notice which parts of your body are tense. Try to relax the muscles in those areas.  Download or buy apps  on your mobile phone or tablet that can help you stick to your quit plan. There are many free apps, such as QuitGuide from the [employer] Office manager for Disease Control and Prevention). You can find  more support from smokefree.gov and other websites. This information is not intended to replace advice given to you by your health care provider. Make sure you discuss any questions you have with your health care provider. Document Released: 07/27/2009 Document Revised: 05/28/2016 Document Reviewed: 02/14/2015 Elsevier Interactive Patient Education  2017 [employer].

## 2016-12-31 NOTE — Patient Instructions (Signed)

## 2017-01-03 LAB — TOXASSURE SELECT 13 (MW), URINE

## 2017-01-06 ENCOUNTER — Other Ambulatory Visit
Admission: RE | Admit: 2017-01-06 | Discharge: 2017-01-06 | Disposition: A | Discharge status: Home / Self Care | Payer: Medicare Other | Source: Ambulatory Visit | Attending: Pain Medicine | Admitting: Pain Medicine

## 2017-01-06 DIAGNOSIS — G894 Chronic pain syndrome: Secondary | ICD-10-CM | POA: Diagnosis present

## 2017-01-06 DIAGNOSIS — R209 Unspecified disturbances of skin sensation: Secondary | ICD-10-CM | POA: Diagnosis present

## 2017-01-06 LAB — COMPREHENSIVE METABOLIC PANEL
ALT: 24 U/L (ref 17–63)
ANION GAP: 6 (ref 5–15)
AST: 30 U/L (ref 15–41)
Albumin: 4.2 g/dL (ref 3.5–5.0)
Alkaline Phosphatase: 53 U/L (ref 38–126)
BILIRUBIN TOTAL: 0.4 mg/dL (ref 0.3–1.2)
BUN: 12 mg/dL (ref 6–20)
CHLORIDE: 107 mmol/L (ref 101–111)
CO2: 29 mmol/L (ref 22–32)
Calcium: 9.7 mg/dL (ref 8.9–10.3)
Creatinine, Ser: 0.79 mg/dL (ref 0.61–1.24)
Glucose, Bld: 121 mg/dL — ABNORMAL HIGH (ref 65–99)
POTASSIUM: 4 mmol/L (ref 3.5–5.1)
Sodium: 142 mmol/L (ref 135–145)
Total Protein: 6.9 g/dL (ref 6.5–8.1)

## 2017-01-06 LAB — SEDIMENTATION RATE: SED RATE: 5 mm/h (ref 0–20)

## 2017-01-06 LAB — C-REACTIVE PROTEIN

## 2017-01-06 LAB — VITAMIN B12: Vitamin B-12: 637 pg/mL (ref 180–914)

## 2017-01-06 LAB — MAGNESIUM: Magnesium: 2.2 mg/dL (ref 1.7–2.4)

## 2017-01-09 LAB — 25-HYDROXY VITAMIN D LCMS D2+D3
25-Hydroxy, Vitamin D-3: 58 ng/mL
25-Hydroxy, Vitamin D: 58 ng/mL

## 2017-01-09 LAB — 25-HYDROXYVITAMIN D LCMS D2+D3

## 2017-02-03 ENCOUNTER — Telehealth: Payer: Self-pay | Admitting: Pain Medicine

## 2017-02-03 NOTE — Telephone Encounter (Signed)
Patient does have an order for a thoracic epidural and can be scheduled for this if no PA is needed.

## 2017-02-03 NOTE — Telephone Encounter (Signed)
Patient wants to get procedure in mid back area, having increased pain, please call patient to determine what kind of procedure and let us know when we have an order put in

## 2017-02-10 ENCOUNTER — Ambulatory Visit (HOSPITAL_BASED_OUTPATIENT_CLINIC_OR_DEPARTMENT_OTHER): Payer: Medicare Other | Admitting: Pain Medicine

## 2017-02-10 ENCOUNTER — Ambulatory Visit
Admission: RE | Admit: 2017-02-10 | Discharge: 2017-02-10 | Disposition: A | Discharge status: Home / Self Care | Payer: Medicare Other | Source: Ambulatory Visit | Attending: Pain Medicine | Admitting: Pain Medicine

## 2017-02-10 ENCOUNTER — Encounter: Payer: Self-pay | Admitting: Pain Medicine

## 2017-02-10 VITALS — BP 153/87 | HR 65 | Temp 97.9°F | Resp 20 | Ht 69.0 in | Wt 152.0 lb

## 2017-02-10 DIAGNOSIS — G8912 Acute post-thoracotomy pain: Secondary | ICD-10-CM | POA: Diagnosis not present

## 2017-02-10 DIAGNOSIS — M546 Pain in thoracic spine: Secondary | ICD-10-CM

## 2017-02-10 DIAGNOSIS — M5414 Radiculopathy, thoracic region: Secondary | ICD-10-CM

## 2017-02-10 MED ORDER — IOPAMIDOL (ISOVUE-M 200) INJECTION 41%
10.0000 mL | Freq: Once | INTRAMUSCULAR | Status: AC
  Administered 2017-02-10: 10 mL via EPIDURAL
  Filled 2017-02-10: qty 10

## 2017-02-10 MED ORDER — LIDOCAINE HCL (PF) 1 % IJ SOLN
10.0000 mL | Freq: Once | INTRAMUSCULAR | Status: AC
  Administered 2017-02-10: 5 mL
  Filled 2017-02-10: qty 10

## 2017-02-10 MED ORDER — DEXAMETHASONE SODIUM PHOSPHATE 10 MG/ML IJ SOLN
10.0000 mg | Freq: Once | INTRAMUSCULAR | Status: AC
  Administered 2017-02-10: 10 mg
  Filled 2017-02-10: qty 1

## 2017-02-10 MED ORDER — ROPIVACAINE HCL 2 MG/ML IJ SOLN
2.0000 mL | Freq: Once | INTRAMUSCULAR | Status: AC
  Administered 2017-02-10: 10 mL via EPIDURAL
  Filled 2017-02-10: qty 10

## 2017-02-10 MED ORDER — SODIUM CHLORIDE 0.9% FLUSH
2.0000 mL | Freq: Once | INTRAVENOUS | Status: AC
  Administered 2017-02-10: 10 mL

## 2017-02-10 NOTE — Progress Notes (Signed)
Safety precautions to be maintained throughout the outpatient stay will include: orient to surroundings, keep bed in low position, maintain call bell within reach at all times, provide assistance with transfer out of bed and ambulation.

## 2017-02-10 NOTE — Progress Notes (Signed)
Patient's Name: Rick FARISH Sr.  MRN: [id number]  Referring Provider: [facility], [place]  DOB: Nov 05, 1950  PCP: [facility]  DOS: 02/10/2017  Note by: Kathlen Brunswick. Dossie Arbour, MD  Service setting: Ambulatory outpatient  Location: [facility] (AMB) Pain Management Facility  Visit type: Procedure  Specialty: Interventional Pain Management  Patient type: Established   Primary Reason for Visit: Interventional Pain Management Treatment. CC: Back Pain (mid back in the center)  Procedure:  Anesthesia, Analgesia, Anxiolysis:  Type: Palliative Inter-Laminar Thoracic Epidural Block Region: Posterior Thoracolumbar Level: T8-9 Laterality: Right-Sided Paraspinal  Type: Local Anesthesia Local Anesthetic: Lidocaine 1% Route: Infiltration ([place]/IM) IV Access: Declined Sedation: Declined  Indication(s): Analgesia          Indications: 1. Thoracic radicular pain (Right)   2. Post-thoracotomy pain syndrome   3. Thoracic spine pain    Pain Score: Pre-procedure: 5 /10 Post-procedure: 5 /10  Pre-op Assessment:  Previous date of service: 12/31/16 Service provided: Med Refill, Evaluation Rick Carter is a 66 y.o. (year old), male patient, seen today for interventional treatment. He  has a past surgical history that includes Tonsillectomy; Lung removal, partial; and Neck surgery. His primarily concern today is the Back Pain (mid back in the center)  Initial Vital Signs: Blood pressure (!) 174/84, pulse 76, temperature 97.9 F (36.6 C), resp. rate (!) 22, height _0  (1.753 m), weight 152 lb (68.9 kg), SpO2 94 %. BMI: 22.45 kg/m  Risk Assessment: Allergies: Reviewed. He is allergic to atorvastatin and varenicline.  Allergy Precautions: None required Coagulopathies: "Reviewed. None identified.  Blood-thinner therapy: None at this time Active Infection(s): Reviewed. None identified. Rick Carter is afebrile  Site Confirmation: Rick Carter was asked to confirm the procedure and laterality before marking the  site Procedure checklist: Completed Consent: Before the procedure and under the influence of no sedative(s), amnesic(s), or anxiolytics, the patient was informed of the treatment options, risks and possible complications. To fulfill our ethical and legal obligations, as recommended by the American Medical Association's Code of Ethics, I have informed the patient of my clinical impression; the nature and purpose of the treatment or procedure; the risks, benefits, and possible complications of the intervention; the alternatives, including doing nothing; the risk(s) and benefit(s) of the alternative treatment(s) or procedure(s); and the risk(s) and benefit(s) of doing nothing. The patient was provided information about the general risks and possible complications associated with the procedure. These may include, but are not limited to: failure to achieve desired goals, infection, bleeding, organ or nerve damage, allergic reactions, paralysis, and death. In addition, the patient was informed of those risks and complications associated to Spine-related procedures, such as failure to decrease pain; infection (i.e.: Meningitis, epidural or intraspinal abscess); bleeding (i.e.: epidural hematoma, subarachnoid hemorrhage, or any other type of intraspinal or peri-dural bleeding); organ or nerve damage (i.e.: Any type of peripheral nerve, nerve root, or spinal cord injury) with subsequent damage to sensory, motor, and/or autonomic systems, resulting in permanent pain, numbness, and/or weakness of one or several areas of the body; allergic reactions; (i.e.: anaphylactic reaction); and/or death. Furthermore, the patient was informed of those risks and complications associated with the medications. These include, but are not limited to: allergic reactions (i.e.: anaphylactic or anaphylactoid reaction(s)); adrenal axis suppression; blood sugar elevation that in diabetics may result in ketoacidosis or comma; water retention  that in patients with history of congestive heart failure may result in shortness of breath, pulmonary edema, and decompensation with resultant heart failure; weight gain; swelling  or edema; medication-induced neural toxicity; particulate matter embolism and blood vessel occlusion with resultant organ, and/or nervous system infarction; and/or aseptic necrosis of one or more joints. Finally, the patient was informed that Medicine is not an exact science; therefore, there is also the possibility of unforeseen or unpredictable risks and/or possible complications that may result in a catastrophic outcome. The patient indicated having understood very clearly. We have given the patient no guarantees and we have made no promises. Enough time was given to the patient to ask questions, all of which were answered to the patient's satisfaction. Rick Carter has indicated that he wanted to continue with the procedure. Attestation: I, the ordering provider, attest that I have discussed with the patient the benefits, risks, side-effects, alternatives, likelihood of achieving goals, and potential problems during recovery for the procedure that I have provided informed consent. Date: 02/10/2017; Time: 8:45 AM  Pre-Procedure Preparation:  Monitoring: As per clinic protocol. Respiration, ETCO2, SpO2, BP, heart rate and rhythm monitor placed and checked for adequate function Safety Precautions: Patient was assessed for positional comfort and pressure points before starting the procedure. Time-out: I initiated and conducted the "Time-out" before starting the procedure, as per protocol. The patient was asked to participate by confirming the accuracy of the "Time Out" information. Verification of the correct person, site, and procedure were performed and confirmed by me, the nursing staff, and the patient. "Time-out" conducted as per Joint Commission's Universal Protocol (UP.01.01.01). "Time-out" Date & Time: 02/10/2017; 0932  hrs.  Description of Procedure Process:  Position: Prone Target Area: For Epidural Steroid injection(s), the target area is the  interlaminar space, initially targeting the lower border of the superior vertebral body lamina. Approach: Interlaminar approach. Area Prepped: Entire Posterior Thoracolumbar Region Prepping solution: ChloraPrep (2% chlorhexidine gluconate and 70% isopropyl alcohol) Safety Precautions: Aspiration looking for blood return was conducted prior to all injections. At no point did we inject any substances, as a needle was being advanced. No attempts were made at seeking any paresthesias. Safe injection practices and needle disposal techniques used. Medications properly checked for expiration dates. SDV (single dose vial) medications used. Description of the Procedure: Protocol guidelines were followed. The patient was placed in position over the fluoroscopy table. The target area was identified and the area prepped in the usual manner. Skin & deeper tissues infiltrated with local anesthetic. Appropriate amount of time allowed to pass for local anesthetics to take effect. The procedure needles were then advanced to the target area. The inferior aspect of the superior lamina was contacted and the needle walked caudad, until the lamina was cleared. The epidural space was identified using "loss-of-resistance technique" with 0.9% PF-NSS (2-37m), in a low friction 10cc LOR glass syringe. Proper needle placement was secured. Negative aspiration confirmed. Solution injected in intermittent fashion, asking for systemic symptoms every 0.5 cc of injectate. The needles were then removed and the area cleansed, making sure to leave some of the prepping solution behind to take advantage of its long term bactericidal properties. Vitals:   02/10/17 0824 02/10/17 0934 02/10/17 0940  BP: (!) 174/84 (!) 186/90 (!) 153/87  Pulse: 76 69 65  Resp: (!) _0 Temp: 97.9 F (36.6 C)    SpO2: 94% 98%  96%  Weight: 152 lb (68.9 kg)    Height: _1  (1.753 m)      Start Time: 0932 hrs. End Time: 0936 hrs. Materials:  Needle(s) Type: Epidural needle Gauge: 17G Length: 3.5-in Medication(s): We administered dexamethasone, iopamidol, lidocaine (  PF), sodium chloride flush, and ropivacaine (PF) 2 mg/mL (0.2%). Please see chart orders for dosing details.  Imaging Guidance (Spinal):  Type of Imaging Technique: Fluoroscopy Guidance (Spinal) Indication(s): Assistance in needle guidance and placement for procedures requiring needle placement in or near specific anatomical locations not easily accessible without such assistance. Exposure Time: Please see nurses notes. Contrast: Before injecting any contrast, we confirmed that the patient did not have an allergy to iodine, shellfish, or radiological contrast. Once satisfactory needle placement was completed at the desired level, radiological contrast was injected. Contrast injected under live fluoroscopy. No contrast complications. See chart for type and volume of contrast used. Fluoroscopic Guidance: I was personally present during the use of fluoroscopy. "Tunnel Vision Technique" used to obtain the best possible view of the target area. Parallax error corrected before commencing the procedure. "Direction-depth-direction" technique used to introduce the needle under continuous pulsed fluoroscopy. Once target was reached, antero-posterior, oblique, and lateral fluoroscopic projection used confirm needle placement in all planes. Images permanently stored in EMR. Interpretation: I personally interpreted the imaging intraoperatively. Adequate needle placement confirmed in multiple planes. Appropriate spread of contrast into desired area was observed. No evidence of afferent or efferent intravascular uptake. No intrathecal or subarachnoid spread observed. Permanent images saved into the patient's record.  Antibiotic Prophylaxis:  Indication(s): None  identified Antibiotic given: None  Post-operative Assessment:  EBL: None Complications: No immediate post-treatment complications observed by team, or reported by patient. Note: The patient tolerated the entire procedure well. A repeat set of vitals were taken after the procedure and the patient was kept under observation following institutional policy, for this type of procedure. Post-procedural neurological assessment was performed, showing return to baseline, prior to discharge. The patient was provided with post-procedure discharge instructions, including a section on how to identify potential problems. Should any problems arise concerning this procedure, the patient was given instructions to immediately contact us, at any time, without hesitation. In any case, we plan to contact the patient by telephone for a follow-up status report regarding this interventional procedure. Comments:  No additional relevant information.  Plan of Care  Disposition: Discharge home  Discharge Date & Time: 02/10/2017; 0950 hrs.  Physician-requested Follow-up:  Return for Post-Procedure Eval, (2 wks), by MD.  Future Appointments Date Time Provider [place]  03/12/2017 1:45 PM Milinda Pointer, MD [facility] None  03/27/2017 10:00 AM Milinda Pointer, MD [facility] None   Medications ordered for procedure: Meds ordered this encounter  Medications  . dexamethasone (DECADRON) injection 10 mg  . iopamidol (ISOVUE-M) 41 % intrathecal injection 10 mL  . lidocaine (PF) (XYLOCAINE) 1 % injection 10 mL  . sodium chloride flush (NS) 0.9 % injection 2 mL  . ropivacaine (PF) 2 mg/mL (0.2%) (NAROPIN) injection 2 mL   Medications administered: We administered dexamethasone, iopamidol, lidocaine (PF), sodium chloride flush, and ropivacaine (PF) 2 mg/mL (0.2%).  See the medical record for exact dosing, route, and time of administration.  Lab-work, Procedure(s), & Referral(s) Ordered: Orders Placed This  Encounter  Procedures  . Thoracic Epidural Injection  . DG C-Arm 1-60 Min-No Report  . Informed Consent Details: Transcribe to consent form and obtain patient signature  . Provider attestation of informed consent for procedure/surgical case  . Verify informed consent  . Discharge instructions   Imaging Ordered: Results for orders placed in visit on 08/26/16  DG C-Arm 1-60 Min-No Report   Narrative Fluoroscopy was utilized by the requesting physician.  No radiographic  interpretation.    New Prescriptions  No medications on file   Primary Care Physician: [facility] Location: [facility] Facility Note by: Kathlen Brunswick. Dossie Arbour, M.D, DABA, DABAPM, DABPM, DABIPP, FIPP Date: 02/10/2017; Time: 2:33 PM  Disclaimer:  Medicine is not an exact science. The only guarantee in medicine is that nothing is guaranteed. It is important to note that the decision to proceed with this intervention was based on the information collected from the patient. The Data and conclusions were drawn from the patient's questionnaire, the interview, and the physical examination. Because the information was provided in large part by the patient, it cannot be guaranteed that it has not been purposely or unconsciously manipulated. Every effort has been made to obtain as much relevant data as possible for this evaluation. It is important to note that the conclusions that lead to this procedure are derived in large part from the available data. Always take into account that the treatment will also be dependent on availability of resources and existing treatment guidelines, considered by other Pain Management Practitioners as being common knowledge and practice, at the time of the intervention. For Medico-Legal purposes, it is also important to point out that variation in procedural techniques and pharmacological choices are the acceptable norm. The indications, contraindications, technique, and results  of the above procedure should only be interpreted and judged by a Board-Certified Interventional Pain Specialist with extensive familiarity and expertise in the same exact procedure and technique.  Instructions provided at this appointment: Patient Instructions   Steps to Quit Smoking Smoking tobacco can be bad for your health. It can also affect almost every organ in your body. Smoking puts you and people around you at risk for many serious long-lasting (chronic) diseases. Quitting smoking is hard, but it is one of the best things that you can do for your health. It is never too late to quit. What are the benefits of quitting smoking? When you quit smoking, you lower your risk for getting serious diseases and conditions. They can include:  Lung cancer or lung disease.  Heart disease.  Stroke.  Heart attack.  Not being able to have children (infertility).  Weak bones (osteoporosis) and broken bones (fractures). If you have coughing, wheezing, and shortness of breath, those symptoms may get better when you quit. You may also get sick less often. If you are pregnant, quitting smoking can help to lower your chances of having a baby of low birth weight. What can I do to help me quit smoking? Talk with your doctor about what can help you quit smoking. Some things you can do (strategies) include:  Quitting smoking totally, instead of slowly cutting back how much you smoke over a period of time.  Going to in-person counseling. You are more likely to quit if you go to many counseling sessions.  Using resources and support systems, such as:  Online chats with a Social worker.  Phone quitlines.  Printed Furniture conservator/restorer.  Support groups or group counseling.  Text messaging programs.  Mobile phone apps or applications.  Taking medicines. Some of these medicines may have nicotine in them. If you are pregnant or breastfeeding, do not take any medicines to quit smoking unless your doctor  says it is okay. Talk with your doctor about counseling or other things that can help you. Talk with your doctor about using more than one strategy at the same time, such as taking medicines while you are also going to in-person counseling. This can help make quitting easier. What things can  I do to make it easier to quit? Quitting smoking might feel very hard at first, but there is a lot that you can do to make it easier. Take these steps:  Talk to your family and friends. Ask them to support and encourage you.  Call phone quitlines, reach out to support groups, or work with a Social worker.  Ask people who smoke to not smoke around you.  Avoid places that make you want (trigger) to smoke, such as:  Bars.  Parties.  Smoke-break areas at work.  Spend time with people who do not smoke.  Lower the stress in your life. Stress can make you want to smoke. Try these things to help your stress:  Getting regular exercise.  Deep-breathing exercises.  Yoga.  Meditating.  Doing a body scan. To do this, close your eyes, focus on one area of your body at a time from head to toe, and notice which parts of your body are tense. Try to relax the muscles in those areas.  Download or buy apps on your mobile phone or tablet that can help you stick to your quit plan. There are many free apps, such as QuitGuide from the [employer] Office manager for Disease Control and Prevention). You can find more support from smokefree.gov and other websites. This information is not intended to replace advice given to you by your health care provider. Make sure you discuss any questions you have with your health care provider. Document Released: 07/27/2009 Document Revised: 05/28/2016 Document Reviewed: 02/14/2015 Elsevier Interactive Patient Education  2017 [place]. Post-Procedure instructions Instructions:  Apply ice: Fill a plastic sandwich bag with crushed ice. Cover it with a small towel and apply to injection site.  Apply for 15 minutes then remove x 15 minutes. Repeat sequence on day of procedure, until you go to bed. The purpose is to minimize swelling and discomfort after procedure.  Apply heat: Apply heat to procedure site starting the day following the procedure. The purpose is to treat any soreness and discomfort from the procedure.  Food intake: Start with clear liquids (like water) and advance to regular food, as tolerated.   Physical activities: Keep activities to a minimum for the first 8 hours after the procedure.   Driving: If you have received any sedation, you are not allowed to drive for 24 hours after your procedure.  Blood thinner: Restart your blood thinner 6 hours after your procedure. (Only for those taking blood thinners)  Insulin: As soon as you can eat, you may resume your normal dosing schedule. (Only for those taking insulin)  Infection prevention: Keep procedure site clean and dry.  Post-procedure Pain Diary: Extremely important that this be done correctly and accurately. Recorded information will be used to determine the next step in treatment.  Pain evaluated is that of treated area only. Do not include pain from an untreated area.  Complete every hour, on the hour, for the initial 8 hours. Set an alarm to help you do this part accurately.  Do not go to sleep and have it completed later. It will not be accurate.  Follow-up appointment: Keep your follow-up appointment after the procedure. Usually 2 weeks for most procedures. (6 weeks in the case of radiofrequency.) Bring you pain diary.  Expect:  From numbing medicine (AKA: Local Anesthetics): Numbness or decrease in pain.  Onset: Full effect within 15 minutes of injected.  Duration: It will depend on the type of local anesthetic used. On the average, 1 to 8 hours.  From steroids: Decrease in swelling or inflammation. Once inflammation is improved, relief of the pain will follow.  Onset of benefits: Depends on the  amount of swelling present. The more swelling, the longer it will take for the benefits to be seen.   Duration: Steroids will stay in the system x 2 weeks. Duration of benefits will depend on multiple posibilities including persistent irritating factors.  From procedure: Some discomfort is to be expected once the numbing medicine wears off. This should be minimal if ice and heat are applied as instructed. Call if:  You experience numbness and weakness that gets worse with time, as opposed to wearing off.  New onset bowel or bladder incontinence. (Spinal procedures only)  Emergency Numbers:  Durning business hours (Monday - Thursday, 8:00 AM - 4:00 PM) (Friday, 9:00 AM - 12:00 Noon): [phone]  After hours: [phone] _____________________________________________________________________________________________  Pain Management Discharge Instructions  General Discharge Instructions :  If you need to reach your doctor call: Monday-Friday 8:00 am - 4:00 pm at [phone] or toll free [phone].  After clinic hours [phone] to have operator reach doctor.  Bring all of your medication bottles to all your appointments in the pain clinic.  To cancel or reschedule your appointment with Pain Management please remember to call 24 hours in advance to avoid a fee.  Refer to the educational materials which you have been given on: General Risks, I had my Procedure. Discharge Instructions, Post Sedation.  Post Procedure Instructions:  The drugs you were given will stay in your system until tomorrow, so for the next 24 hours you should not drive, make any legal decisions or drink any alcoholic beverages.  You may eat anything you prefer, but it is better to start with liquids then soups and crackers, and gradually work up to solid foods.  Please notify your doctor immediately if you have any unusual bleeding, trouble breathing or pain that is not related to your normal  pain.  Depending on the type of procedure that was done, some parts of your body may feel week and/or numb.  This usually clears up by tonight or the next day.  Walk with the use of an assistive device or accompanied by an adult for the 24 hours.  You may use ice on the affected area for the first 24 hours.  Put ice in a Ziploc bag and cover with a towel and place against area 15 minutes on 15 minutes off.  You may switch to heat after 24 hours.GENERAL RISKS AND COMPLICATIONS  What are the risk, side effects and possible complications? Generally speaking, most procedures are safe.  However, with any procedure there are risks, side effects, and the possibility of complications.  The risks and complications are dependent upon the sites that are lesioned, or the type of nerve block to be performed.  The closer the procedure is to the spine, the more serious the risks are.  Great care is taken when placing the radio frequency needles, block needles or lesioning probes, but sometimes complications can occur. 1. Infection: Any time there is an injection through the skin, there is a risk of infection.  This is why sterile conditions are used for these blocks.  There are four possible types of infection. 1. Localized skin infection. 2. Central Nervous System Infection-This can be in the form of Meningitis, which can be deadly. 3. Epidural Infections-This can be in the form of an epidural abscess, which can cause pressure inside of the spine,  causing compression of the spinal cord with subsequent paralysis. This would require an emergency surgery to decompress, and there are no guarantees that the patient would recover from the paralysis. 4. Discitis-This is an infection of the intervertebral discs.  It occurs in about 1% of discography procedures.  It is difficult to treat and it may lead to surgery.        2. Pain: the needles have to go through skin and soft tissues, will cause soreness.       3. Damage  to internal structures:  The nerves to be lesioned may be near blood vessels or    other nerves which can be potentially damaged.       4. Bleeding: Bleeding is more common if the patient is taking blood thinners such as  aspirin, Coumadin, Ticiid, Plavix, etc., or if he/she have some genetic predisposition  such as hemophilia. Bleeding into the spinal canal can cause compression of the spinal  cord with subsequent paralysis.  This would require an emergency surgery to  decompress and there are no guarantees that the patient would recover from the  paralysis.       5. Pneumothorax:  Puncturing of a lung is a possibility, every time a needle is introduced in  the area of the chest or upper back.  Pneumothorax refers to free air around the  collapsed lung(s), inside of the thoracic cavity (chest cavity).  Another two possible  complications related to a similar event would include: Hemothorax and Chylothorax.   These are variations of the Pneumothorax, where instead of air around the collapsed  lung(s), you may have blood or chyle, respectively.       6. Spinal headaches: They may occur with any procedures in the area of the spine.       7. Persistent CSF (Cerebro-Spinal Fluid) leakage: This is a rare problem, but may occur  with prolonged intrathecal or epidural catheters either due to the formation of a fistulous  track or a dural tear.       8. Nerve damage: By working so close to the spinal cord, there is always a possibility of  nerve damage, which could be as serious as a permanent spinal cord injury with  paralysis.       9. Death:  Although rare, severe deadly allergic reactions known as "Anaphylactic  reaction" can occur to any of the medications used.      10. Worsening of the symptoms:  We can always make thing worse.  What are the chances of something like this happening? Chances of any of this occuring are extremely low.  By statistics, you have more of a chance of getting killed in a motor  vehicle accident: while driving to the hospital than any of the above occurring .  Nevertheless, you should be aware that they are possibilities.  In general, it is similar to taking a shower.  Everybody knows that you can slip, hit your head and get killed.  Does that mean that you should not shower again?  Nevertheless always keep in mind that statistics do not mean anything if you happen to be on the wrong side of them.  Even if a procedure has a 1 (one) in a 1,000,000 (million) chance of going wrong, it you happen to be that one..Also, keep in mind that by statistics, you have more of a chance of having something go wrong when taking medications.  Who should not have this procedure? If you are  on a blood thinning medication (e.g. Coumadin, Plavix, see list of "Blood Thinners"), or if you have an active infection going on, you should not have the procedure.  If you are taking any blood thinners, please inform your physician.  How should I prepare for this procedure?  Do not eat or drink anything at least six hours prior to the procedure.  Bring a driver with you .  It cannot be a taxi.  Come accompanied by an adult that can drive you back, and that is strong enough to help you if your legs get weak or numb from the local anesthetic.  Take all of your medicines the morning of the procedure with just enough water to swallow them.  If you have diabetes, make sure that you are scheduled to have your procedure done first thing in the morning, whenever possible.  If you have diabetes, take only half of your insulin dose and notify our nurse that you have done so as soon as you arrive at the clinic.  If you are diabetic, but only take blood sugar pills (oral hypoglycemic), then do not take them on the morning of your procedure.  You may take them after you have had the procedure.  Do not take aspirin or any aspirin-containing medications, at least eleven (11) days prior to the procedure.  They may  prolong bleeding.  Wear loose fitting clothing that may be easy to take off and that you would not mind if it got stained with Betadine or blood.  Do not wear any jewelry or perfume  Remove any nail coloring.  It will interfere with some of our monitoring equipment.  NOTE: Remember that this is not meant to be interpreted as a complete list of all possible complications.  Unforeseen problems may occur.  BLOOD THINNERS The following drugs contain aspirin or other products, which can cause increased bleeding during surgery and should not be taken for 2 weeks prior to and 1 week after surgery.  If you should need take something for relief of minor pain, you may take acetaminophen which is found in Tylenol,m Datril, Anacin-3 and Panadol. It is not blood thinner. The products listed below are.  Do not take any of the products listed below in addition to any listed on your instruction sheet.  A.P.C or A.P.C with Codeine Codeine Phosphate Capsules #3 Ibuprofen Ridaura  ABC compound Congesprin Imuran rimadil  Advil Cope Indocin Robaxisal  Alka-Seltzer Effervescent Pain Reliever and Antacid Coricidin or Coricidin-D  Indomethacin Rufen  Alka-Seltzer plus Cold Medicine Cosprin Ketoprofen S-A-C Tablets  Anacin Analgesic Tablets or Capsules Coumadin Korlgesic Salflex  Anacin Extra Strength Analgesic tablets or capsules CP-2 Tablets Lanoril Salicylate  Anaprox Cuprimine Capsules Levenox Salocol  Anexsia-D Dalteparin Magan Salsalate  Anodynos Darvon compound Magnesium Salicylate Sine-off  Ansaid Dasin Capsules Magsal Sodium Salicylate  Anturane Depen Capsules Marnal Soma  APF Arthritis pain formula Dewitt's Pills Measurin Stanback  Argesic Dia-Gesic Meclofenamic Sulfinpyrazone  Arthritis Bayer Timed Release Aspirin Diclofenac Meclomen Sulindac  Arthritis pain formula Anacin Dicumarol Medipren Supac  Analgesic (Safety coated) Arthralgen Diffunasal Mefanamic Suprofen  Arthritis Strength Bufferin  Dihydrocodeine Mepro Compound Suprol  Arthropan liquid Dopirydamole Methcarbomol with Aspirin Synalgos  ASA tablets/Enseals Disalcid Micrainin Tagament  Ascriptin Doan's Midol Talwin  Ascriptin A/D Dolene Mobidin Tanderil  Ascriptin Extra Strength Dolobid Moblgesic Ticlid  Ascriptin with Codeine Doloprin or Doloprin with Codeine Momentum Tolectin  Asperbuf Duoprin Mono-gesic Trendar  Aspergum Duradyne Motrin or Motrin IB Triminicin  Aspirin plain, buffered  or enteric coated Durasal Myochrisine Trigesic  Aspirin Suppositories Easprin Nalfon Trillsate  Aspirin with Codeine Ecotrin Regular or Extra Strength Naprosyn Uracel  Atromid-S Efficin Naproxen Ursinus  Auranofin Capsules Elmiron Neocylate Vanquish  Axotal Emagrin Norgesic Verin  Azathioprine Empirin or Empirin with Codeine Normiflo Vitamin E  Azolid Emprazil Nuprin Voltaren  Bayer Aspirin plain, buffered or children's or timed BC Tablets or powders Encaprin Orgaran Warfarin Sodium  Buff-a-Comp Enoxaparin Orudis Zorpin  Buff-a-Comp with Codeine Equegesic Os-Cal-Gesic   Buffaprin Excedrin plain, buffered or Extra Strength Oxalid   Bufferin Arthritis Strength Feldene Oxphenbutazone   Bufferin plain or Extra Strength Feldene Capsules Oxycodone with Aspirin   Bufferin with Codeine Fenoprofen Fenoprofen Pabalate or Pabalate-SF   Buffets II Flogesic Panagesic   Buffinol plain or Extra Strength Florinal or Florinal with Codeine Panwarfarin   Buf-Tabs Flurbiprofen Penicillamine   Butalbital Compound Four-way cold tablets Penicillin   Butazolidin Fragmin Pepto-Bismol   Carbenicillin Geminisyn Percodan   Carna Arthritis Reliever Geopen Persantine   Carprofen Gold's salt Persistin   Chloramphenicol Goody's Phenylbutazone   Chloromycetin Haltrain Piroxlcam   Clmetidine heparin Plaquenil   Cllnoril Hyco-pap Ponstel   Clofibrate Hydroxy chloroquine Propoxyphen         Before stopping any of these medications, be sure to consult the  physician who ordered them.  Some, such as Coumadin (Warfarin) are ordered to prevent or treat serious conditions such as "deep thrombosis", "pumonary embolisms", and other heart problems.  The amount of time that you may need off of the medication may also vary with the medication and the reason for which you were taking it.  If you are taking any of these medications, please make sure you notify your pain physician before you undergo any procedures.          Epidural Steroid Injection An epidural steroid injection is a shot of steroid medicine and numbing medicine that is given into the space between the spinal cord and the bones in your back (epidural space). The shot helps relieve pain caused by an irritated or swollen nerve root. The amount of pain relief you get from the injection depends on what is causing the nerve to be swollen and irritated, and how long your pain lasts. You are more likely to benefit from this injection if your pain is strong and comes on suddenly rather than if you have had pain for a long time. Tell a health care provider about:  Any allergies you have.  All medicines you are taking, including vitamins, herbs, eye drops, creams, and over-the-counter medicines.  Any problems you or family members have had with anesthetic medicines.  Any blood disorders you have.  Any surgeries you have had.  Any medical conditions you have.  Whether you are pregnant or may be pregnant. What are the risks? Generally, this is a safe procedure. However, problems may occur, including:  Headache.  Bleeding.  Infection.  Allergic reaction to medicines.  Damage to your nerves. What happens before the procedure? Staying hydrated  Follow instructions from your health care provider about hydration, which may include:  Up to 2 hours before the procedure - you may continue to drink clear liquids, such as water, clear fruit juice, black coffee, and plain tea. Eating and  drinking restrictions  Follow instructions from your health care provider about eating and drinking, which may include:  8 hours before the procedure - stop eating heavy meals or foods such as meat, fried foods, or fatty foods.  6 hours  before the procedure - stop eating light meals or foods, such as toast or cereal.  6 hours before the procedure - stop drinking milk or drinks that contain milk.  2 hours before the procedure - stop drinking clear liquids. Medicine   You may be given medicines to lower anxiety.  Ask your health care provider about:  Changing or stopping your regular medicines. This is especially important if you are taking diabetes medicines or blood thinners.  Taking medicines such as aspirin and ibuprofen. These medicines can thin your blood. Do not take these medicines before your procedure if your health care provider instructs you not to. General instructions   Plan to have someone take you home from the hospital or clinic. What happens during the procedure?  You may receive a medicine to help you relax (sedative).  You will be asked to lie on your abdomen.  The injection site will be cleaned.  A numbing medicine (local anesthetic) will be used to numb the injection site.  A needle will be inserted through your skin into the epidural space. You may feel some discomfort when this happens. An X-ray machine will be used to make sure the needle is put as close as possible to the affected nerve.  A steroid medicine and a local anesthetic will be injected into the epidural space.  The needle will be removed.  A bandage (dressing) will be put over the injection site. What happens after the procedure?  Your blood pressure, heart rate, breathing rate, and blood oxygen level will be monitored until the medicines you were given have worn off.  Your arm or leg may feel weak or numb for a few hours.  The injection site may feel sore.  Do not drive for 24 hours  if you received a sedative. This information is not intended to replace advice given to you by your health care provider. Make sure you discuss any questions you have with your health care provider. Document Released: 01/07/2008 Document Revised: 03/13/2016 Document Reviewed: 01/16/2016 [employer] Interactive Patient Education  2017 [employer].  Please complete your post procedure diary and return it at your next appointment.

## 2017-02-10 NOTE — Patient Instructions (Addendum)
Steps to Quit Smoking Smoking tobacco can be bad for your health. It can also affect almost every organ in your body. Smoking puts you and people around you at risk for many serious long-lasting (chronic) diseases. Quitting smoking is hard, but it is one of the best things that you can do for your health. It is never too late to quit. What are the benefits of quitting smoking? When you quit smoking, you lower your risk for getting serious diseases and conditions. They can include:  Lung cancer or lung disease.  Heart disease.  Stroke.  Heart attack.  Not being able to have children (infertility).  Weak bones (osteoporosis) and broken bones (fractures). If you have coughing, wheezing, and shortness of breath, those symptoms may get better when you quit. You may also get sick less often. If you are pregnant, quitting smoking can help to lower your chances of having a baby of low birth weight. What can I do to help me quit smoking? Talk with your doctor about what can help you quit smoking. Some things you can do (strategies) include:  Quitting smoking totally, instead of slowly cutting back how much you smoke over a period of time.  Going to in-person counseling. You are more likely to quit if you go to many counseling sessions.  Using resources and support systems, such as:  Online chats with a Social worker.  Phone quitlines.  Printed Furniture conservator/restorer.  Support groups or group counseling.  Text messaging programs.  Mobile phone apps or applications.  Taking medicines. Some of these medicines may have nicotine in them. If you are pregnant or breastfeeding, do not take any medicines to quit smoking unless your doctor says it is okay. Talk with your doctor about counseling or other things that can help you. Talk with your doctor about using more than one strategy at the same time, such as taking medicines while you are also going to in-person counseling. This can help make quitting  easier. What things can I do to make it easier to quit? Quitting smoking might feel very hard at first, but there is a lot that you can do to make it easier. Take these steps:  Talk to your family and friends. Ask them to support and encourage you.  Call phone quitlines, reach out to support groups, or work with a Social worker.  Ask people who smoke to not smoke around you.  Avoid places that make you want (trigger) to smoke, such as:  Bars.  Parties.  Smoke-break areas at work.  Spend time with people who do not smoke.  Lower the stress in your life. Stress can make you want to smoke. Try these things to help your stress:  Getting regular exercise.  Deep-breathing exercises.  Yoga.  Meditating.  Doing a body scan. To do this, close your eyes, focus on one area of your body at a time from head to toe, and notice which parts of your body are tense. Try to relax the muscles in those areas.  Download or buy apps on your mobile phone or tablet that can help you stick to your quit plan. There are many free apps, such as QuitGuide from the [employer] Office manager for Disease Control and Prevention). You can find more support from smokefree.gov and other websites. This information is not intended to replace advice given to you by your health care provider. Make sure you discuss any questions you have with your health care provider. Document Released: 07/27/2009 Document Revised: 05/28/2016 Document  Reviewed: 02/14/2015 Elsevier Interactive Patient Education  2017 [place]. Post-Procedure instructions Instructions:  Apply ice: Fill a plastic sandwich bag with crushed ice. Cover it with a small towel and apply to injection site. Apply for 15 minutes then remove x 15 minutes. Repeat sequence on day of procedure, until you go to bed. The purpose is to minimize swelling and discomfort after procedure.  Apply heat: Apply heat to procedure site starting the day following the procedure. The purpose  is to treat any soreness and discomfort from the procedure.  Food intake: Start with clear liquids (like water) and advance to regular food, as tolerated.   Physical activities: Keep activities to a minimum for the first 8 hours after the procedure.   Driving: If you have received any sedation, you are not allowed to drive for 24 hours after your procedure.  Blood thinner: Restart your blood thinner 6 hours after your procedure. (Only for those taking blood thinners)  Insulin: As soon as you can eat, you may resume your normal dosing schedule. (Only for those taking insulin)  Infection prevention: Keep procedure site clean and dry.  Post-procedure Pain Diary: Extremely important that this be done correctly and accurately. Recorded information will be used to determine the next step in treatment.  Pain evaluated is that of treated area only. Do not include pain from an untreated area.  Complete every hour, on the hour, for the initial 8 hours. Set an alarm to help you do this part accurately.  Do not go to sleep and have it completed later. It will not be accurate.  Follow-up appointment: Keep your follow-up appointment after the procedure. Usually 2 weeks for most procedures. (6 weeks in the case of radiofrequency.) Bring you pain diary.  Expect:  From numbing medicine (AKA: Local Anesthetics): Numbness or decrease in pain.  Onset: Full effect within 15 minutes of injected.  Duration: It will depend on the type of local anesthetic used. On the average, 1 to 8 hours.   From steroids: Decrease in swelling or inflammation. Once inflammation is improved, relief of the pain will follow.  Onset of benefits: Depends on the amount of swelling present. The more swelling, the longer it will take for the benefits to be seen.   Duration: Steroids will stay in the system x 2 weeks. Duration of benefits will depend on multiple posibilities including persistent irritating factors.  From  procedure: Some discomfort is to be expected once the numbing medicine wears off. This should be minimal if ice and heat are applied as instructed. Call if:  You experience numbness and weakness that gets worse with time, as opposed to wearing off.  New onset bowel or bladder incontinence. (Spinal procedures only)  Emergency Numbers:  Durning business hours (Monday - Thursday, 8:00 AM - 4:00 PM) (Friday, 9:00 AM - 12:00 Noon): [phone]  After hours: [phone] _____________________________________________________________________________________________  Pain Management Discharge Instructions  General Discharge Instructions :  If you need to reach your doctor call: Monday-Friday 8:00 am - 4:00 pm at [phone] or toll free [phone].  After clinic hours [phone] to have operator reach doctor.  Bring all of your medication bottles to all your appointments in the pain clinic.  To cancel or reschedule your appointment with Pain Management please remember to call 24 hours in advance to avoid a fee.  Refer to the educational materials which you have been given on: General Risks, I had my Procedure. Discharge Instructions, Post Sedation.  Post Procedure Instructions:  The drugs you were given will stay in your system until tomorrow, so for the next 24 hours you should not drive, make any legal decisions or drink any alcoholic beverages.  You may eat anything you prefer, but it is better to start with liquids then soups and crackers, and gradually work up to solid foods.  Please notify your doctor immediately if you have any unusual bleeding, trouble breathing or pain that is not related to your normal pain.  Depending on the type of procedure that was done, some parts of your body may feel week and/or numb.  This usually clears up by tonight or the next day.  Walk with the use of an assistive device or accompanied by an adult for the 24 hours.  You may use ice on  the affected area for the first 24 hours.  Put ice in a Ziploc bag and cover with a towel and place against area 15 minutes on 15 minutes off.  You may switch to heat after 24 hours.GENERAL RISKS AND COMPLICATIONS  What are the risk, side effects and possible complications? Generally speaking, most procedures are safe.  However, with any procedure there are risks, side effects, and the possibility of complications.  The risks and complications are dependent upon the sites that are lesioned, or the type of nerve block to be performed.  The closer the procedure is to the spine, the more serious the risks are.  Great care is taken when placing the radio frequency needles, block needles or lesioning probes, but sometimes complications can occur. 1. Infection: Any time there is an injection through the skin, there is a risk of infection.  This is why sterile conditions are used for these blocks.  There are four possible types of infection. 1. Localized skin infection. 2. Central Nervous System Infection-This can be in the form of Meningitis, which can be deadly. 3. Epidural Infections-This can be in the form of an epidural abscess, which can cause pressure inside of the spine, causing compression of the spinal cord with subsequent paralysis. This would require an emergency surgery to decompress, and there are no guarantees that the patient would recover from the paralysis. 4. Discitis-This is an infection of the intervertebral discs.  It occurs in about 1% of discography procedures.  It is difficult to treat and it may lead to surgery.        2. Pain: the needles have to go through skin and soft tissues, will cause soreness.       3. Damage to internal structures:  The nerves to be lesioned may be near blood vessels or    other nerves which can be potentially damaged.       4. Bleeding: Bleeding is more common if the patient is taking blood thinners such as  aspirin, Coumadin, Ticiid, Plavix, etc., or if  he/she have some genetic predisposition  such as hemophilia. Bleeding into the spinal canal can cause compression of the spinal  cord with subsequent paralysis.  This would require an emergency surgery to  decompress and there are no guarantees that the patient would recover from the  paralysis.       5. Pneumothorax:  Puncturing of a lung is a possibility, every time a needle is introduced in  the area of the chest or upper back.  Pneumothorax refers to free air around the  collapsed lung(s), inside of the thoracic cavity (chest cavity).  Another two possible  complications related to a similar event would  include: Hemothorax and Chylothorax.   These are variations of the Pneumothorax, where instead of air around the collapsed  lung(s), you may have blood or chyle, respectively.       6. Spinal headaches: They may occur with any procedures in the area of the spine.       7. Persistent CSF (Cerebro-Spinal Fluid) leakage: This is a rare problem, but may occur  with prolonged intrathecal or epidural catheters either due to the formation of a fistulous  track or a dural tear.       8. Nerve damage: By working so close to the spinal cord, there is always a possibility of  nerve damage, which could be as serious as a permanent spinal cord injury with  paralysis.       9. Death:  Although rare, severe deadly allergic reactions known as "Anaphylactic  reaction" can occur to any of the medications used.      10. Worsening of the symptoms:  We can always make thing worse.  What are the chances of something like this happening? Chances of any of this occuring are extremely low.  By statistics, you have more of a chance of getting killed in a motor vehicle accident: while driving to the hospital than any of the above occurring .  Nevertheless, you should be aware that they are possibilities.  In general, it is similar to taking a shower.  Everybody knows that you can slip, hit your head and get killed.  Does that mean  that you should not shower again?  Nevertheless always keep in mind that statistics do not mean anything if you happen to be on the wrong side of them.  Even if a procedure has a 1 (one) in a 1,000,000 (million) chance of going wrong, it you happen to be that one..Also, keep in mind that by statistics, you have more of a chance of having something go wrong when taking medications.  Who should not have this procedure? If you are on a blood thinning medication (e.g. Coumadin, Plavix, see list of "Blood Thinners"), or if you have an active infection going on, you should not have the procedure.  If you are taking any blood thinners, please inform your physician.  How should I prepare for this procedure?  Do not eat or drink anything at least six hours prior to the procedure.  Bring a driver with you .  It cannot be a taxi.  Come accompanied by an adult that can drive you back, and that is strong enough to help you if your legs get weak or numb from the local anesthetic.  Take all of your medicines the morning of the procedure with just enough water to swallow them.  If you have diabetes, make sure that you are scheduled to have your procedure done first thing in the morning, whenever possible.  If you have diabetes, take only half of your insulin dose and notify our nurse that you have done so as soon as you arrive at the clinic.  If you are diabetic, but only take blood sugar pills (oral hypoglycemic), then do not take them on the morning of your procedure.  You may take them after you have had the procedure.  Do not take aspirin or any aspirin-containing medications, at least eleven (11) days prior to the procedure.  They may prolong bleeding.  Wear loose fitting clothing that may be easy to take off and that you would not mind if it got stained  with Betadine or blood.  Do not wear any jewelry or perfume  Remove any nail coloring.  It will interfere with some of our monitoring  equipment.  NOTE: Remember that this is not meant to be interpreted as a complete list of all possible complications.  Unforeseen problems may occur.  BLOOD THINNERS The following drugs contain aspirin or other products, which can cause increased bleeding during surgery and should not be taken for 2 weeks prior to and 1 week after surgery.  If you should need take something for relief of minor pain, you may take acetaminophen which is found in Tylenol,m Datril, Anacin-3 and Panadol. It is not blood thinner. The products listed below are.  Do not take any of the products listed below in addition to any listed on your instruction sheet.  A.P.C or A.P.C with Codeine Codeine Phosphate Capsules #3 Ibuprofen Ridaura  ABC compound Congesprin Imuran rimadil  Advil Cope Indocin Robaxisal  Alka-Seltzer Effervescent Pain Reliever and Antacid Coricidin or Coricidin-D  Indomethacin Rufen  Alka-Seltzer plus Cold Medicine Cosprin Ketoprofen S-A-C Tablets  Anacin Analgesic Tablets or Capsules Coumadin Korlgesic Salflex  Anacin Extra Strength Analgesic tablets or capsules CP-2 Tablets Lanoril Salicylate  Anaprox Cuprimine Capsules Levenox Salocol  Anexsia-D Dalteparin Magan Salsalate  Anodynos Darvon compound Magnesium Salicylate Sine-off  Ansaid Dasin Capsules Magsal Sodium Salicylate  Anturane Depen Capsules Marnal Soma  APF Arthritis pain formula Dewitt's Pills Measurin Stanback  Argesic Dia-Gesic Meclofenamic Sulfinpyrazone  Arthritis Bayer Timed Release Aspirin Diclofenac Meclomen Sulindac  Arthritis pain formula Anacin Dicumarol Medipren Supac  Analgesic (Safety coated) Arthralgen Diffunasal Mefanamic Suprofen  Arthritis Strength Bufferin Dihydrocodeine Mepro Compound Suprol  Arthropan liquid Dopirydamole Methcarbomol with Aspirin Synalgos  ASA tablets/Enseals Disalcid Micrainin Tagament  Ascriptin Doan's Midol Talwin  Ascriptin A/D Dolene Mobidin Tanderil  Ascriptin Extra Strength Dolobid  Moblgesic Ticlid  Ascriptin with Codeine Doloprin or Doloprin with Codeine Momentum Tolectin  Asperbuf Duoprin Mono-gesic Trendar  Aspergum Duradyne Motrin or Motrin IB Triminicin  Aspirin plain, buffered or enteric coated Durasal Myochrisine Trigesic  Aspirin Suppositories Easprin Nalfon Trillsate  Aspirin with Codeine Ecotrin Regular or Extra Strength Naprosyn Uracel  Atromid-S Efficin Naproxen Ursinus  Auranofin Capsules Elmiron Neocylate Vanquish  Axotal Emagrin Norgesic Verin  Azathioprine Empirin or Empirin with Codeine Normiflo Vitamin E  Azolid Emprazil Nuprin Voltaren  Bayer Aspirin plain, buffered or children's or timed BC Tablets or powders Encaprin Orgaran Warfarin Sodium  Buff-a-Comp Enoxaparin Orudis Zorpin  Buff-a-Comp with Codeine Equegesic Os-Cal-Gesic   Buffaprin Excedrin plain, buffered or Extra Strength Oxalid   Bufferin Arthritis Strength Feldene Oxphenbutazone   Bufferin plain or Extra Strength Feldene Capsules Oxycodone with Aspirin   Bufferin with Codeine Fenoprofen Fenoprofen Pabalate or Pabalate-SF   Buffets II Flogesic Panagesic   Buffinol plain or Extra Strength Florinal or Florinal with Codeine Panwarfarin   Buf-Tabs Flurbiprofen Penicillamine   Butalbital Compound Four-way cold tablets Penicillin   Butazolidin Fragmin Pepto-Bismol   Carbenicillin Geminisyn Percodan   Carna Arthritis Reliever Geopen Persantine   Carprofen Gold's salt Persistin   Chloramphenicol Goody's Phenylbutazone   Chloromycetin Haltrain Piroxlcam   Clmetidine heparin Plaquenil   Cllnoril Hyco-pap Ponstel   Clofibrate Hydroxy chloroquine Propoxyphen         Before stopping any of these medications, be sure to consult the physician who ordered them.  Some, such as Coumadin (Warfarin) are ordered to prevent or treat serious conditions such as "deep thrombosis", "pumonary embolisms", and other heart problems.  The amount of time that you  may need off of the medication may also vary with  the medication and the reason for which you were taking it.  If you are taking any of these medications, please make sure you notify your pain physician before you undergo any procedures.          Epidural Steroid Injection An epidural steroid injection is a shot of steroid medicine and numbing medicine that is given into the space between the spinal cord and the bones in your back (epidural space). The shot helps relieve pain caused by an irritated or swollen nerve root. The amount of pain relief you get from the injection depends on what is causing the nerve to be swollen and irritated, and how long your pain lasts. You are more likely to benefit from this injection if your pain is strong and comes on suddenly rather than if you have had pain for a long time. Tell a health care provider about:  Any allergies you have.  All medicines you are taking, including vitamins, herbs, eye drops, creams, and over-the-counter medicines.  Any problems you or family members have had with anesthetic medicines.  Any blood disorders you have.  Any surgeries you have had.  Any medical conditions you have.  Whether you are pregnant or may be pregnant. What are the risks? Generally, this is a safe procedure. However, problems may occur, including:  Headache.  Bleeding.  Infection.  Allergic reaction to medicines.  Damage to your nerves. What happens before the procedure? Staying hydrated  Follow instructions from your health care provider about hydration, which may include:  Up to 2 hours before the procedure - you may continue to drink clear liquids, such as water, clear fruit juice, black coffee, and plain tea. Eating and drinking restrictions  Follow instructions from your health care provider about eating and drinking, which may include:  8 hours before the procedure - stop eating heavy meals or foods such as meat, fried foods, or fatty foods.  6 hours before the procedure - stop  eating light meals or foods, such as toast or cereal.  6 hours before the procedure - stop drinking milk or drinks that contain milk.  2 hours before the procedure - stop drinking clear liquids. Medicine   You may be given medicines to lower anxiety.  Ask your health care provider about:  Changing or stopping your regular medicines. This is especially important if you are taking diabetes medicines or blood thinners.  Taking medicines such as aspirin and ibuprofen. These medicines can thin your blood. Do not take these medicines before your procedure if your health care provider instructs you not to. General instructions   Plan to have someone take you home from the hospital or clinic. What happens during the procedure?  You may receive a medicine to help you relax (sedative).  You will be asked to lie on your abdomen.  The injection site will be cleaned.  A numbing medicine (local anesthetic) will be used to numb the injection site.  A needle will be inserted through your skin into the epidural space. You may feel some discomfort when this happens. An X-ray machine will be used to make sure the needle is put as close as possible to the affected nerve.  A steroid medicine and a local anesthetic will be injected into the epidural space.  The needle will be removed.  A bandage (dressing) will be put over the injection site. What happens after the procedure?  Your blood pressure,  heart rate, breathing rate, and blood oxygen level will be monitored until the medicines you were given have worn off.  Your arm or leg may feel weak or numb for a few hours.  The injection site may feel sore.  Do not drive for 24 hours if you received a sedative. This information is not intended to replace advice given to you by your health care provider. Make sure you discuss any questions you have with your health care provider. Document Released: 01/07/2008 Document Revised: 03/13/2016 Document  Reviewed: 01/16/2016 Elsevier Interactive Patient Education  2017 [place].  Please complete your post procedure diary and return it at your next appointment.

## 2017-02-11 ENCOUNTER — Telehealth: Payer: Self-pay | Admitting: *Deleted

## 2017-02-11 NOTE — Telephone Encounter (Signed)
Denies complications post procedure.

## 2017-03-12 ENCOUNTER — Ambulatory Visit: Payer: Medicare Other | Attending: Pain Medicine | Admitting: Pain Medicine

## 2017-03-12 ENCOUNTER — Encounter: Payer: Self-pay | Admitting: Pain Medicine

## 2017-03-12 VITALS — BP 151/78 | HR 67 | Temp 97.7°F | Resp 16 | Ht 69.0 in | Wt 152.0 lb

## 2017-03-12 DIAGNOSIS — I10 Essential (primary) hypertension: Secondary | ICD-10-CM | POA: Diagnosis not present

## 2017-03-12 DIAGNOSIS — M47816 Spondylosis without myelopathy or radiculopathy, lumbar region: Secondary | ICD-10-CM

## 2017-03-12 DIAGNOSIS — K5730 Diverticulosis of large intestine without perforation or abscess without bleeding: Secondary | ICD-10-CM | POA: Diagnosis not present

## 2017-03-12 DIAGNOSIS — M5412 Radiculopathy, cervical region: Secondary | ICD-10-CM | POA: Diagnosis not present

## 2017-03-12 DIAGNOSIS — F319 Bipolar disorder, unspecified: Secondary | ICD-10-CM | POA: Diagnosis not present

## 2017-03-12 DIAGNOSIS — J449 Chronic obstructive pulmonary disease, unspecified: Secondary | ICD-10-CM | POA: Diagnosis not present

## 2017-03-12 DIAGNOSIS — K5903 Drug induced constipation: Secondary | ICD-10-CM | POA: Insufficient documentation

## 2017-03-12 DIAGNOSIS — H3530 Unspecified macular degeneration: Secondary | ICD-10-CM | POA: Insufficient documentation

## 2017-03-12 DIAGNOSIS — M5414 Radiculopathy, thoracic region: Secondary | ICD-10-CM | POA: Insufficient documentation

## 2017-03-12 DIAGNOSIS — M797 Fibromyalgia: Secondary | ICD-10-CM | POA: Insufficient documentation

## 2017-03-12 DIAGNOSIS — Z79891 Long term (current) use of opiate analgesic: Secondary | ICD-10-CM | POA: Diagnosis not present

## 2017-03-12 DIAGNOSIS — Z7984 Long term (current) use of oral hypoglycemic drugs: Secondary | ICD-10-CM | POA: Insufficient documentation

## 2017-03-12 DIAGNOSIS — F17210 Nicotine dependence, cigarettes, uncomplicated: Secondary | ICD-10-CM | POA: Diagnosis not present

## 2017-03-12 DIAGNOSIS — R209 Unspecified disturbances of skin sensation: Secondary | ICD-10-CM | POA: Insufficient documentation

## 2017-03-12 DIAGNOSIS — K219 Gastro-esophageal reflux disease without esophagitis: Secondary | ICD-10-CM | POA: Insufficient documentation

## 2017-03-12 DIAGNOSIS — M4696 Unspecified inflammatory spondylopathy, lumbar region: Secondary | ICD-10-CM

## 2017-03-12 DIAGNOSIS — M5010 Cervical disc disorder with radiculopathy, unspecified cervical region: Secondary | ICD-10-CM | POA: Diagnosis not present

## 2017-03-12 DIAGNOSIS — M65342 Trigger finger, left ring finger: Secondary | ICD-10-CM | POA: Insufficient documentation

## 2017-03-12 DIAGNOSIS — I70208 Unspecified atherosclerosis of native arteries of extremities, other extremity: Secondary | ICD-10-CM | POA: Insufficient documentation

## 2017-03-12 DIAGNOSIS — M48061 Spinal stenosis, lumbar region without neurogenic claudication: Secondary | ICD-10-CM | POA: Insufficient documentation

## 2017-03-12 DIAGNOSIS — M5441 Lumbago with sciatica, right side: Secondary | ICD-10-CM

## 2017-03-12 DIAGNOSIS — M545 Low back pain: Secondary | ICD-10-CM | POA: Diagnosis present

## 2017-03-12 DIAGNOSIS — G8929 Other chronic pain: Secondary | ICD-10-CM

## 2017-03-12 DIAGNOSIS — M4722 Other spondylosis with radiculopathy, cervical region: Secondary | ICD-10-CM | POA: Insufficient documentation

## 2017-03-12 DIAGNOSIS — Z7982 Long term (current) use of aspirin: Secondary | ICD-10-CM | POA: Diagnosis not present

## 2017-03-12 DIAGNOSIS — I4891 Unspecified atrial fibrillation: Secondary | ICD-10-CM | POA: Insufficient documentation

## 2017-03-12 DIAGNOSIS — F1021 Alcohol dependence, in remission: Secondary | ICD-10-CM | POA: Insufficient documentation

## 2017-03-12 DIAGNOSIS — E785 Hyperlipidemia, unspecified: Secondary | ICD-10-CM | POA: Insufficient documentation

## 2017-03-12 DIAGNOSIS — M5126 Other intervertebral disc displacement, lumbar region: Secondary | ICD-10-CM | POA: Insufficient documentation

## 2017-03-12 DIAGNOSIS — F419 Anxiety disorder, unspecified: Secondary | ICD-10-CM | POA: Insufficient documentation

## 2017-03-12 DIAGNOSIS — M5116 Intervertebral disc disorders with radiculopathy, lumbar region: Secondary | ICD-10-CM | POA: Diagnosis not present

## 2017-03-12 NOTE — Progress Notes (Signed)
Patient's Name: Rick EDGELL Sr.  MRN: [id number]  Referring Provider: [facility], [place]  DOB: Sep 07, 1951  PCP: [facility], [place]  DOS: 03/12/2017  Note by: Kathlen Brunswick. Dossie Arbour, MD  Service setting: Ambulatory outpatient  Specialty: Interventional Pain Management  Location: [facility] (AMB) Pain Management Facility    Patient type: Established   Primary Reason(s) for Visit: Encounter for post-procedure evaluation of chronic illness with mild to moderate exacerbation CC: Back Pain (low back pain down the right leg.)  HPI  Mr. Visser is a 66 y.o. year old, male patient, who comes today for a post-procedure evaluation. 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[place]); Atrial fibrillation ([place]); Bipolar affective disorder ([place]); Chronic obstructive pulmonary disease ([place]); Degeneration of intervertebral disc of cervical region; Colon, diverticulosis; Acid reflux; Essential (primary) hypertension; HLD (hyperlipidemia); Breath shortness; Compulsive tobacco user syndrome; Diverticular disease of large intestine; Post-thoracotomy pain syndrome; Encounter for therapeutic drug level monitoring; Long term current use of opiate analgesic; Long term prescription opiate use; Uncomplicated opioid dependence ([place]); Opiate use (30 MME/Day); Fibromyalgia; Chronic pain syndrome; Lumbar facet syndrome (Location of Primary Source of Pain) (Bilateral) (R>L); History of alcoholism ([place]); Chronic low back pain (Location of Primary Source of Pain) (midline); Lumbar spondylosis; Cervical spondylosis; Failed cervical surgery syndrome; Nicotine dependence; Tobacco abuse; Substance Use Disorder Risk: High; History of right-sided partial pneumonectomy; Atherosclerotic peripheral vascular disease ([place]); Musculoskeletal pain; Neurogenic pain; Neuropathic pain; Opioid-induced constipation ([place]); Osteoarthritis; NSAID induced gastritis; Thoracic spine pain; Thoracic radicular pain (Right); Chronic lumbar radicular pain (L5/S1  dermatome) (Location of Secondary source of pain) (Right); Lumbar foraminal stenosis (L5-S1) (Right); Lumbar facet arthropathy (Bilateral) (R>L); Lumbar disc protrusion (L3-4) (Right); Chronic cervical radicular pain (Left); Tumor of parotid gland; Trigger finger, left ring finger; and Disturbance of skin sensation on his problem list. His primarily concern today is the Back Pain (low back pain down the right leg.)  Pain Assessment: Self-Reported Pain Score: 3 /10             Reported level is compatible with observation.       Pain Type: Chronic pain Pain Location: Back Pain Orientation: Right Pain Descriptors / Indicators: Sharp Pain Frequency: Constant  Mr. Fitzhenry comes in today for post-procedure evaluation after the treatment done on 02/10/2017.  Further details on both, my assessment(s), as well as the proposed treatment plan, please see below.  Post-Procedure Assessment  02/10/2017 Procedure: Palliative right-sided T8-9 thoracic epidural steroid injection under fluoroscopic guidance, no sedation Pre-procedure pain score:  5/10 Post-procedure pain score: 5/10         Influential Factors: BMI: 22.45 kg/m Intra-procedural challenges: None observed Assessment challenges: None detected         Post-procedural side-effects, adverse reactions, or complications: None reported Reported issues: None  Sedation: Please see nurses note. When no sedatives are used, the analgesic levels obtained are directly associated to the effectiveness of the local anesthetics. However, when sedation is provided, the level of analgesia obtained during the initial 1 hour following the intervention, is believed to be the result of a combination of factors. These factors may include, but are not limited to: 1. The effectiveness of the local anesthetics used. 2. The effects of the analgesic(s) and/or anxiolytic(s) used. 3. The degree of discomfort experienced by the patient at the time of the procedure. 4. The  patients ability and reliability in recalling and recording the events. 5. The presence and influence of possible secondary gains and/or psychosocial factors. Reported result: Relief experienced during the  1st hour after the procedure: 100 % (Ultra-Short Term Relief) Interpretative annotation: Analgesia during this period is likely to be Local Anesthetic and/or IV Sedative (Analgesic/Anxiolitic) related.          Effects of local anesthetic: The analgesic effects attained during this period are directly associated to the localized infiltration of local anesthetics and therefore cary significant diagnostic value as to the etiological location, or anatomical origin, of the pain. Expected duration of relief is directly dependent on the pharmacodynamics of the local anesthetic used. Long-acting (4-6 hours) anesthetics used.  Reported result: Relief during the next 4 to 6 hour after the procedure: 70 % (Short-Term Relief) Interpretative annotation: Complete relief would suggest area to be the source of the pain.          Long-term benefit: Defined as the period of time past the expected duration of local anesthetics. With the possible exception of prolonged sympathetic blockade from the local anesthetics, benefits during this period are typically attributed to, or associated with, other factors such as analgesic sensory neuropraxia, antiinflammatory effects, or beneficial biochemical changes provided by agents other than the local anesthetics Reported result: Extended relief following procedure: 70 % (Long-Term Relief) Interpretative annotation: Good relief. This could suggest inflammation to be a significant component in the etiology to the pain.          Current benefits: Defined as persistent relief that continues at this point in time.   Reported results: Treated area: <75 %       Interpretative annotation: Ongoing benefits would suggest effective palliative intervention  Interpretation: Results  would suggest a successful therapeutic intervention.          Laboratory Chemistry  Inflammation Markers Lab Results  Component Value Date   CRP <0.8 01/06/2017   ESRSEDRATE 5 01/06/2017   (CRP: Acute Phase) (ESR: Chronic Phase) Renal Function Markers Lab Results  Component Value Date   BUN 12 01/06/2017   CREATININE 0.79 01/06/2017   GFRAA >60 01/06/2017   GFRNONAA >60 01/06/2017   Hepatic Function Markers Lab Results  Component Value Date   AST 30 01/06/2017   ALT 24 01/06/2017   ALBUMIN 4.2 01/06/2017   ALKPHOS 53 01/06/2017   Electrolytes Lab Results  Component Value Date   NA 142 01/06/2017   K 4.0 01/06/2017   CL 107 01/06/2017   CALCIUM 9.7 01/06/2017   MG 2.2 01/06/2017   Neuropathy Markers Lab Results  Component Value Date   VITAMINB12 637 01/06/2017   Bone Pathology Markers Lab Results  Component Value Date   ALKPHOS 53 01/06/2017   25OHVITD1 58 01/06/2017   25OHVITD2 <1.0 01/06/2017   25OHVITD3 58 01/06/2017   CALCIUM 9.7 01/06/2017   Coagulation Parameters No results found for: INR, LABPROT, APTT, PLT Cardiovascular Markers No results found for: BNP, HGB, HCT Note: Lab results reviewed.  Recent Diagnostic Imaging Review  Dg C-arm 1-60 Min-no Report  Result Date: 02/10/2017 Fluoroscopy was utilized by the requesting physician.  No radiographic interpretation.   Note: Imaging results reviewed.          Meds  The patient has a current medication list which includes the following prescription(s): aclidinium bromide, advair diskus, albuterol, albuterol, albuterol, aspirin ec, calcium carb-cholecalciferol, cholecalciferol, diphenhydramine, docusate sodium, fluticasone-salmeterol, hydrochlorothiazide, hydroxyzine, ibuprofen, incruse ellipta, ipratropium-albuterol, loratadine, metformin, methocarbamol, multiple vitamins-minerals, oxycodone-acetaminophen, pantoprazole, pregabalin, sildenafil, sodium chloride hypertonic, trazodone, tudorza pressair,  umeclidinium bromide, oxycodone-acetaminophen, and oxycodone-acetaminophen.  Current Outpatient Prescriptions on File Prior to Visit  Medication Sig  . Aclidinium Bromide (  TUDORZA PRESSAIR IN) Inhale 40 mcg into the lungs 2 (two) times daily.  Marland Kitchen ADVAIR DISKUS 250-50 MCG/DOSE AEPB INHALE 1 PUFF 2 (TWO) TIMES A DAY.  Marland Kitchen albuterol (PROVENTIL HFA;VENTOLIN HFA) 108 (90 Base) MCG/ACT inhaler Inhale 2 puffs into the lungs every 4 (four) hours as needed.   Marland Kitchen albuterol (PROVENTIL HFA;VENTOLIN HFA) 108 (90 Base) MCG/ACT inhaler Inhale into the lungs.  Marland Kitchen albuterol (PROVENTIL) (2.5 MG/3ML) 0.083% nebulizer solution INHALE 0.5 ML (2.5 MG TOTAL) BY NEBULIZATION EVERY SIX (6) HOURS AS NEEDED FOR WHEEZING.  Marland Kitchen aspirin EC 81 MG tablet Take 81 mg by mouth daily.  . Calcium Carb-Cholecalciferol 600-800 MG-UNIT TABS TAKE 1 TABLET BY MOUTH 2 TIMES A DAY  . cholecalciferol (VITAMIN D) 1000 UNITS tablet Take 2,000 Units by mouth 2 (two) times daily.   . diphenhydrAMINE (BENADRYL) 25 mg capsule Take 25 mg by mouth 2 (two) times daily.   Marland Kitchen docusate sodium (COLACE) 100 MG capsule Take 1 capsule (100 mg total) by mouth daily.  . Fluticasone-Salmeterol (ADVAIR) 500-50 MCG/DOSE AEPB Inhale 1 puff into the lungs 2 (two) times daily.  . hydrochlorothiazide (MICROZIDE) 12.5 MG capsule TAKE ONE CAPSULE BY MOUTH FOR HIGH BLOOD PRESSURE  . hydrOXYzine (ATARAX/VISTARIL) 25 MG tablet Take 25 mg by mouth as needed.  Marland Kitchen ibuprofen (ADVIL,MOTRIN) 800 MG tablet Take 1 tablet (800 mg total) by mouth every 8 (eight) hours as needed for moderate pain.  . INCRUSE ELLIPTA 62.5 MCG/INH AEPB   . ipratropium-albuterol (DUONEB) 0.5-2.5 (3) MG/3ML SOLN Take 3 mLs by nebulization every 6 (six) hours as needed.  . loratadine (CLARITIN) 10 MG tablet Take 10 mg by mouth.   . metFORMIN (GLUCOPHAGE) 500 MG tablet Take 500 mg by mouth 2 (two) times daily with a meal.  . methocarbamol (ROBAXIN) 750 MG tablet Take 1 tablet (750 mg total) by mouth every 8  (eight) hours as needed for muscle spasms.  . Multiple Vitamins-Minerals (PRESERVISION AREDS 2 PO) Take 1 tablet by mouth 2 (two) times daily.  Marland Kitchen oxyCODONE-acetaminophen (PERCOCET/ROXICET) 5-325 MG tablet Take 1 tablet by mouth every 6 (six) hours as needed for severe pain.  . pantoprazole (PROTONIX) 40 MG tablet Take 40 mg by mouth.  . pregabalin (LYRICA) 150 MG capsule Take 1 capsule (150 mg total) by mouth 2 (two) times daily.  . sildenafil (VIAGRA) 100 MG tablet 100 mg.  . sodium chloride HYPERTONIC 3 % nebulizer solution INHALE 4 ML BY NEBULIZATION TWICE A DAY  . traZODone (DESYREL) 50 MG tablet Take 50 mg by mouth at bedtime. Reported on 02/20/2016  . TUDORZA PRESSAIR 400 MCG/ACT AEPB Inhale 1 puff into the lungs 2 (two) times daily.  Marland Kitchen umeclidinium bromide (INCRUSE ELLIPTA) 62.5 MCG/INH AEPB Inhale into the lungs.  Marland Kitchen oxyCODONE-acetaminophen (PERCOCET/ROXICET) 5-325 MG tablet Take 1 tablet by mouth every 6 (six) hours as needed for severe pain.  Marland Kitchen oxyCODONE-acetaminophen (PERCOCET/ROXICET) 5-325 MG tablet Take 1 tablet by mouth every 6 (six) hours as needed for severe pain.   No current facility-administered medications on file prior to visit.    ROS  Constitutional: Denies any fever or chills Gastrointestinal: No reported hemesis, hematochezia, vomiting, or acute GI distress Musculoskeletal: Denies any acute onset joint swelling, redness, loss of ROM, or weakness Neurological: No reported episodes of acute onset apraxia, aphasia, dysarthria, agnosia, amnesia, paralysis, loss of coordination, or loss of consciousness  Allergies  Mr. Stucke is allergic to atorvastatin and varenicline.  PFSH  Drug: Mr. Rekowski  has no drug history on  file. Alcohol:  reports that he does not drink alcohol. Tobacco:  reports that he has been smoking Cigarettes.  He has a 51.00 pack-year smoking history. He has never used smokeless tobacco. Medical:  has a past medical history of Anxiety; COPD (chronic  obstructive pulmonary disease) ([place]); Depression; Hernia of abdominal wall; History of alcoholism ([place]) (08/08/2015); History of pneumonectomy (08/08/2015); Hypertension; Macular degeneration (08/26/2016); and Tumor cells. Family: family history includes Alcohol abuse in his father; Kidney disease in his mother.  Past Surgical History:  Procedure Laterality Date  . LUNG REMOVAL, PARTIAL    . NECK SURGERY     plate in neck- 11 years ago  . TONSILLECTOMY     Constitutional Exam  General appearance: Well nourished, well developed, and well hydrated. In no apparent acute distress Vitals:   03/12/17 1337  BP: (!) 151/78  Pulse: 67  Resp: 16  Temp: 97.7 F (36.5 C)  TempSrc: Oral  SpO2: 94%  Weight: 152 lb (68.9 kg)  Height: 5' 9" (1.753 m)   BMI Assessment: Estimated body mass index is 22.45 kg/m as calculated from the following:   Height as of this encounter: 5' 9" (1.753 m).   Weight as of this encounter: 152 lb (68.9 kg).  BMI interpretation table: BMI level Category Range association with higher incidence of chronic pain  <18 kg/m2 Underweight   18.5-24.9 kg/m2 Ideal body weight   25-29.9 kg/m2 Overweight Increased incidence by 20%  30-34.9 kg/m2 Obese (Class I) Increased incidence by 68%  35-39.9 kg/m2 Severe obesity (Class II) Increased incidence by 136%  >40 kg/m2 Extreme obesity (Class III) Increased incidence by 254%   BMI Readings from Last 4 Encounters:  03/12/17 22.45 kg/m  02/10/17 22.45 kg/m  12/31/16 23.04 kg/m  12/11/16 23.04 kg/m   Wt Readings from Last 4 Encounters:  03/12/17 152 lb (68.9 kg)  02/10/17 152 lb (68.9 kg)  12/31/16 156 lb (70.8 kg)  12/11/16 156 lb (70.8 kg)  Psych/Mental status: Alert, oriented x 3 (person, place, & time)       Eyes: PERLA Respiratory: No evidence of acute respiratory distress  Cervical Spine Exam  Inspection: No masses, redness, or swelling Alignment: Symmetrical Functional ROM: Unrestricted ROM      Stability:  No instability detected Muscle strength & Tone: Functionally intact Sensory: Unimpaired Palpation: No palpable anomalies              Upper Extremity (UE) Exam    Side: Right upper extremity  Side: Left upper extremity  Inspection: No masses, redness, swelling, or asymmetry. No contractures  Inspection: No masses, redness, swelling, or asymmetry. No contractures  Functional ROM: Unrestricted ROM          Functional ROM: Unrestricted ROM          Muscle strength & Tone: Functionally intact  Muscle strength & Tone: Functionally intact  Sensory: Unimpaired  Sensory: Unimpaired  Palpation: No palpable anomalies              Palpation: No palpable anomalies              Specialized Test(s): Deferred         Specialized Test(s): Deferred          Thoracic Spine Exam  Inspection: No masses, redness, or swelling Alignment: Symmetrical Functional ROM: Unrestricted ROM Stability: No instability detected Sensory: Unimpaired Muscle strength & Tone: No palpable anomalies  Lumbar Spine Exam  Inspection: No masses, redness, or swelling Alignment: Symmetrical Functional ROM: Unrestricted  ROM      Stability: No instability detected Muscle strength & Tone: Functionally intact Sensory: Unimpaired Palpation: No palpable anomalies       Provocative Tests: Lumbar Hyperextension and rotation test: evaluation deferred today       Patrick's Maneuver: evaluation deferred today                    Gait & Posture Assessment  Ambulation: Unassisted Gait: Relatively normal for age and body habitus Posture: WNL   Lower Extremity Exam    Side: Right lower extremity  Side: Left lower extremity  Inspection: No masses, redness, swelling, or asymmetry. No contractures  Inspection: No masses, redness, swelling, or asymmetry. No contractures  Functional ROM: Unrestricted ROM          Functional ROM: Unrestricted ROM          Muscle strength & Tone: Functionally intact  Muscle strength & Tone: Functionally intact   Sensory: Unimpaired  Sensory: Unimpaired  Palpation: No palpable anomalies  Palpation: No palpable anomalies   Assessment  Primary Diagnosis & Pertinent Problem List: The primary encounter diagnosis was Chronic low back pain (Location of Primary Source of Pain) (midline). Diagnoses of Lumbar facet syndrome (Location of Primary Source of Pain) (Bilateral) (R>L), Thoracic radicular pain (Right), and Chronic cervical radicular pain (Left) were also pertinent to this visit.  Status Diagnosis  Controlled Controlled Improved 1. Chronic low back pain (Location of Primary Source of Pain) (midline)   2. Lumbar facet syndrome (Location of Primary Source of Pain) (Bilateral) (R>L)   3. Thoracic radicular pain (Right)   4. Chronic cervical radicular pain (Left)     Problems updated and reviewed during this visit: No problems updated. Plan of Care  Pharmacotherapy (Medications Ordered): No orders of the defined types were placed in this encounter.  New Prescriptions   No medications on file   Medications administered today: Mr. Manasco had no medications administered during this visit. Lab-work, procedure(s), and/or referral(s): No orders of the defined types were placed in this encounter.  Imaging and/or referral(s): None  Interventional therapies: Planned, scheduled, and/or pending:   Not at this time.   Considering:   Palliative bilateral lumbar facet block  Palliative left-sided cervical epidural steroid injection  Palliative right-sided L5-S1 lumbar epidural steroid injection  Diagnostic bilateral cervical facet block  Palliative Right-sided T8-9 thoracic epidural steroid injection    Palliative PRN treatment(s):   Palliative bilateral lumbar facet block  Palliative left-sided cervical epidural steroid injection  Palliative right-sided L5-S1 lumbar epidural steroid injection    Provider-requested follow-up: Return for PRN procedure(s).  Future Appointments Date Time  Provider [place]  03/27/2017 10:00 AM Milinda Pointer, MD [facility] None   Primary Care Physician: [facility], [place] Location: [facility] Outpatient Pain Management Facility Note by: Kathlen Brunswick. Dossie Arbour, M.D, DABA, DABAPM, DABPM, DABIPP, FIPP Date: 03/12/2017; Time: 5:50 PM  Patient instructions provided during this appointment: There are no Patient Instructions on file for this visit.

## 2017-03-12 NOTE — Progress Notes (Signed)
Safety precautions to be maintained throughout the outpatient stay will include: orient to surroundings, keep bed in low position, maintain call bell within reach at all times, provide assistance with transfer out of bed and ambulation.  

## 2017-03-27 ENCOUNTER — Encounter: Payer: Self-pay | Admitting: Pain Medicine

## 2017-03-27 ENCOUNTER — Ambulatory Visit: Payer: Medicare Other | Attending: Pain Medicine | Admitting: Pain Medicine

## 2017-03-27 VITALS — BP 117/67 | HR 66 | Temp 98.2°F | Resp 18 | Ht 69.0 in | Wt 154.0 lb

## 2017-03-27 DIAGNOSIS — Z79891 Long term (current) use of opiate analgesic: Secondary | ICD-10-CM

## 2017-03-27 DIAGNOSIS — M5416 Radiculopathy, lumbar region: Secondary | ICD-10-CM | POA: Diagnosis not present

## 2017-03-27 DIAGNOSIS — M5116 Intervertebral disc disorders with radiculopathy, lumbar region: Secondary | ICD-10-CM | POA: Diagnosis not present

## 2017-03-27 DIAGNOSIS — M150 Primary generalized (osteo)arthritis: Secondary | ICD-10-CM

## 2017-03-27 DIAGNOSIS — I739 Peripheral vascular disease, unspecified: Secondary | ICD-10-CM | POA: Diagnosis not present

## 2017-03-27 DIAGNOSIS — Z902 Acquired absence of lung [part of]: Secondary | ICD-10-CM | POA: Diagnosis not present

## 2017-03-27 DIAGNOSIS — M5441 Lumbago with sciatica, right side: Secondary | ICD-10-CM | POA: Diagnosis not present

## 2017-03-27 DIAGNOSIS — Z7984 Long term (current) use of oral hypoglycemic drugs: Secondary | ICD-10-CM | POA: Insufficient documentation

## 2017-03-27 DIAGNOSIS — M4696 Unspecified inflammatory spondylopathy, lumbar region: Secondary | ICD-10-CM

## 2017-03-27 DIAGNOSIS — M791 Myalgia: Secondary | ICD-10-CM

## 2017-03-27 DIAGNOSIS — G8929 Other chronic pain: Secondary | ICD-10-CM

## 2017-03-27 DIAGNOSIS — I4891 Unspecified atrial fibrillation: Secondary | ICD-10-CM | POA: Diagnosis not present

## 2017-03-27 DIAGNOSIS — Z5181 Encounter for therapeutic drug level monitoring: Secondary | ICD-10-CM | POA: Diagnosis not present

## 2017-03-27 DIAGNOSIS — Z9889 Other specified postprocedural states: Secondary | ICD-10-CM | POA: Diagnosis not present

## 2017-03-27 DIAGNOSIS — I10 Essential (primary) hypertension: Secondary | ICD-10-CM | POA: Insufficient documentation

## 2017-03-27 DIAGNOSIS — T402X5A Adverse effect of other opioids, initial encounter: Secondary | ICD-10-CM | POA: Diagnosis not present

## 2017-03-27 DIAGNOSIS — E785 Hyperlipidemia, unspecified: Secondary | ICD-10-CM | POA: Diagnosis not present

## 2017-03-27 DIAGNOSIS — F319 Bipolar disorder, unspecified: Secondary | ICD-10-CM | POA: Diagnosis not present

## 2017-03-27 DIAGNOSIS — F419 Anxiety disorder, unspecified: Secondary | ICD-10-CM | POA: Insufficient documentation

## 2017-03-27 DIAGNOSIS — G894 Chronic pain syndrome: Secondary | ICD-10-CM | POA: Insufficient documentation

## 2017-03-27 DIAGNOSIS — Z79899 Other long term (current) drug therapy: Secondary | ICD-10-CM | POA: Diagnosis not present

## 2017-03-27 DIAGNOSIS — M48061 Spinal stenosis, lumbar region without neurogenic claudication: Secondary | ICD-10-CM | POA: Insufficient documentation

## 2017-03-27 DIAGNOSIS — K5903 Drug induced constipation: Secondary | ICD-10-CM | POA: Diagnosis not present

## 2017-03-27 DIAGNOSIS — Z7982 Long term (current) use of aspirin: Secondary | ICD-10-CM | POA: Diagnosis not present

## 2017-03-27 DIAGNOSIS — F1190 Opioid use, unspecified, uncomplicated: Secondary | ICD-10-CM

## 2017-03-27 DIAGNOSIS — M792 Neuralgia and neuritis, unspecified: Secondary | ICD-10-CM

## 2017-03-27 DIAGNOSIS — Y929 Unspecified place or not applicable: Secondary | ICD-10-CM | POA: Diagnosis not present

## 2017-03-27 DIAGNOSIS — F17210 Nicotine dependence, cigarettes, uncomplicated: Secondary | ICD-10-CM | POA: Diagnosis not present

## 2017-03-27 DIAGNOSIS — M797 Fibromyalgia: Secondary | ICD-10-CM | POA: Insufficient documentation

## 2017-03-27 DIAGNOSIS — M159 Polyosteoarthritis, unspecified: Secondary | ICD-10-CM

## 2017-03-27 DIAGNOSIS — J449 Chronic obstructive pulmonary disease, unspecified: Secondary | ICD-10-CM | POA: Insufficient documentation

## 2017-03-27 DIAGNOSIS — M47816 Spondylosis without myelopathy or radiculopathy, lumbar region: Secondary | ICD-10-CM

## 2017-03-27 DIAGNOSIS — M7918 Myalgia, other site: Secondary | ICD-10-CM

## 2017-03-27 MED ORDER — OXYCODONE-ACETAMINOPHEN 5-325 MG PO TABS: 1.0000 | ORAL_TABLET | Freq: Four times a day (QID) | ORAL | 0 refills | Status: DC | PRN

## 2017-03-27 MED ORDER — PREGABALIN 150 MG PO CAPS: 150.0000 mg | ORAL_CAPSULE | Freq: Two times a day (BID) | ORAL | 0 refills | Status: DC

## 2017-03-27 MED ORDER — IBUPROFEN 800 MG PO TABS: 800.0000 mg | ORAL_TABLET | Freq: Three times a day (TID) | ORAL | 0 refills | Status: DC | PRN

## 2017-03-27 MED ORDER — METHOCARBAMOL 750 MG PO TABS: 750.0000 mg | ORAL_TABLET | Freq: Three times a day (TID) | ORAL | 2 refills | Status: DC | PRN

## 2017-03-27 MED ORDER — DOCUSATE SODIUM 100 MG PO CAPS: 100.0000 mg | ORAL_CAPSULE | Freq: Every day | ORAL | 0 refills | Status: DC

## 2017-03-27 NOTE — Progress Notes (Signed)
Nursing Pain Medication Assessment:  Safety precautions to be maintained throughout the outpatient stay will include: orient to surroundings, keep bed in low position, maintain call bell within reach at all times, provide assistance with transfer out of bed and ambulation.  Medication Inspection Compliance: Pill count conducted under aseptic conditions, in front of the patient. Neither the pills nor the bottle was removed from the patient's sight at any time. Once count was completed pills were immediately returned to the patient in their original bottle.  Medication: Oxycodone/APAP Pill/Patch Count: 45 of 120 pills remain Pill/Patch Appearance: Markings consistent with prescribed medication Bottle Appearance: Standard pharmacy container. Clearly labeled. Filled Date: 05 / 26 / 2018 Last Medication intake:  Today

## 2017-03-27 NOTE — Progress Notes (Signed)
Patient's Name: Rick MAHAR Sr.  MRN: [id number]  Referring Provider: No ref. provider found  DOB: May 15, 1951  PCP: [place]  DOS: 03/27/2017  Note by: Kathlen Brunswick. Dossie Arbour, MD  Service setting: Ambulatory outpatient  Specialty: Interventional Pain Management  Location: [facility] (AMB) Pain Management Facility    Patient type: Established   Primary Reason(s) for Visit: Encounter for prescription drug management (Level of risk: moderate) CC: Back Pain (right)  HPI  Rick Carter is a 66 y.o. year old, male patient, who comes today for a medication management evaluation. 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[place]); Atrial fibrillation ([place]); Bipolar affective disorder ([place]); Chronic obstructive pulmonary disease ([place]); Degeneration of intervertebral disc of cervical region; Colon, diverticulosis; Acid reflux; Essential (primary) hypertension; HLD (hyperlipidemia); Breath shortness; Compulsive tobacco user syndrome; Diverticular disease of large intestine; Post-thoracotomy pain syndrome; Encounter for therapeutic drug level monitoring; Long term current use of opiate analgesic; Long term prescription opiate use; Uncomplicated opioid dependence ([place]); Opiate use (30 MME/Day); Fibromyalgia; Chronic pain syndrome; Lumbar facet syndrome (Location of Primary Source of Pain) (Bilateral) (R>L); History of alcoholism ([place]); Chronic low back pain (Location of Primary Source of Pain) (midline); Lumbar spondylosis; Cervical spondylosis; Failed cervical surgery syndrome; Nicotine dependence; Tobacco abuse; Substance Use Disorder Risk: High; History of right-sided partial pneumonectomy; Atherosclerotic peripheral vascular disease ([place]); Musculoskeletal pain; Neurogenic pain; Neuropathic pain; Opioid-induced constipation (OIC); Osteoarthritis; NSAID induced gastritis; Thoracic spine pain; Thoracic radicular pain (Right); Chronic lumbar radicular pain (L5/S1 dermatome) (Location of Secondary source of  pain) (Right); Lumbar foraminal stenosis (L5-S1) (Right); Lumbar facet arthropathy (Bilateral) (R>L); Lumbar disc protrusion (L3-4) (Right); Chronic cervical radicular pain (Left); Tumor of parotid gland; Trigger finger, left ring finger; and Disturbance of skin sensation on his problem list. His primarily concern today is the Back Pain (right)  Pain Assessment: Self-Reported Pain Score: 4 /10 Clinically the patient looks like a 2/10 Reported level is inconsistent with clinical observations. Information on the proper use of the pain scale provided to the patient today Pain Type: Chronic pain Pain Location: Back Pain Orientation: Lower, Right Pain Descriptors / Indicators: Aching, Radiating, Nagging Pain Frequency: Constant  Mr. Prasad was last scheduled for an appointment on 03/12/2017 for medication management. During today's appointment we reviewed Rick Carter chronic pain status, as well as his outpatient medication regimen.  The patient  has no drug history on file. His body mass index is 22.74 kg/m.  Further details on both, my assessment(s), as well as the proposed treatment plan, please see below.  Controlled Substance Pharmacotherapy Assessment REMS (Risk Evaluation and Mitigation Strategy)  Analgesic:Oxycodone/APAP 5/325 one every 6 hours (20 mg/day) MME/day:30 mg/day   Dewayne Shorter, RN  03/27/2017  9:51 AM  Signed Nursing Pain Medication Assessment:  Safety precautions to be maintained throughout the outpatient stay will include: orient to surroundings, keep bed in low position, maintain call bell within reach at all times, provide assistance with transfer out of bed and ambulation.  Medication Inspection Compliance: Pill count conducted under aseptic conditions, in front of the patient. Neither the pills nor the bottle was removed from the patient's sight at any time. Once count was completed pills were immediately returned to the patient in their original bottle.  Medication:  Oxycodone/APAP Pill/Patch Count: 45 of 120 pills remain Pill/Patch Appearance: Markings consistent with prescribed medication Bottle Appearance: Standard pharmacy container. Clearly labeled. Filled Date: 05 / 26 / 2018 Last Medication intake:  Today   Pharmacokinetics: Liberation and absorption (onset of  action): WNL Distribution (time to peak effect): WNL Metabolism and excretion (duration of action): WNL         Pharmacodynamics: Desired effects: Analgesia: Rick Carter reports >50% benefit. Functional ability: Patient reports that medication allows him to accomplish basic ADLs Clinically meaningful improvement in function (CMIF): Sustained CMIF goals met Perceived effectiveness: Described as relatively effective, allowing for increase in activities of daily living (ADL) Undesirable effects: Side-effects or Adverse reactions: None reported Monitoring: [place] PMP: Online review of the past 10-monthperiod conducted. Compliant with practice rules and regulations List of all UDS test(s) done:  Lab Results  Component Value Date   TOXASSSELUR FINAL 12/31/2016   TOXASSSELUR FINAL 04/01/2016   TOXASSSELUR FINAL 12/27/2015   TOXASSSELUR FINAL 10/31/2015   TOXASSSELUR FINAL 10/02/2015   Last UDS on record: ToxAssure Select 13  Date Value Ref Range Status  12/31/2016 FINAL  Final    Comment:    ==================================================================== TOXASSURE SELECT 13 (MW) ==================================================================== Test                             Result       Flag       Units Drug Present and Declared for Prescription Verification   Oxycodone                      555          EXPECTED   ng/mg creat   Oxymorphone                    1827         EXPECTED   ng/mg creat   Noroxycodone                   1518         EXPECTED   ng/mg creat   Noroxymorphone                 633          EXPECTED   ng/mg creat    Sources of oxycodone are scheduled  prescription medications.    Oxymorphone, noroxycodone, and noroxymorphone are expected    metabolites of oxycodone. Oxymorphone is also available as a    scheduled prescription medication. ==================================================================== Test                      Result    Flag   Units      Ref Range   Creatinine              51               mg/dL      >=20 ==================================================================== Declared Medications:  The flagging and interpretation on this report are based on the  following declared medications.  Unexpected results may arise from  inaccuracies in the declared medications.  **Note: The testing scope of this panel includes these medications:  Oxycodone (Oxycodone Acetaminophen)  **Note: The testing scope of this panel does not include following  reported medications:  Acetaminophen (Oxycodone Acetaminophen)  Aclidinium  Albuterol  Albuterol (Ipratropium-Albuterol)  Aspirin  Calcium Carbonate (Calcium carbonate/Vitamin D)  Diphenhydramine  Docusate  Fluticasone  Hydrochlorothiazide  Hydroxyzine  Ibuprofen  Ipratropium (Ipratropium-Albuterol)  Loratadine  Metformin  Methocarbamol  Multivitamin  Pantoprazole  Pregabalin  Salmeterol  Sildenafil  Sodium Chloride  Trazodone  Vitamin D (Calcium carbonate/Vitamin D) ==================================================================== For clinical consultation,  please call [phone]. ====================================================================    UDS interpretation: Compliant          Medication Assessment Form: Reviewed. Patient indicates being compliant with therapy Treatment compliance: Compliant Risk Assessment Profile: Aberrant behavior: See prior evaluations. None observed or detected today Comorbid factors increasing risk of overdose: See prior notes. No additional risks detected today Risk of substance use disorder (SUD):  Low-to-Moderate Opioid Risk Tool (ORT) Total Score: 8  Interpretation Table:  Score <3 = Low Risk for SUD  Score between 4-7 = Moderate Risk for SUD  Score >8 = High Risk for Opioid Abuse   Risk Mitigation Strategies:  Patient Counseling: Covered Patient-Prescriber Agreement (PPA): Present and active  Notification to other healthcare providers: Done  Pharmacologic Plan: No change in therapy, at this time  Laboratory Chemistry  Inflammation Markers Lab Results  Component Value Date   CRP <0.8 01/06/2017   ESRSEDRATE 5 01/06/2017   (CRP: Acute Phase) (ESR: Chronic Phase) Renal Function Markers Lab Results  Component Value Date   BUN 12 01/06/2017   CREATININE 0.79 01/06/2017   GFRAA >60 01/06/2017   GFRNONAA >60 01/06/2017   Hepatic Function Markers Lab Results  Component Value Date   AST 30 01/06/2017   ALT 24 01/06/2017   ALBUMIN 4.2 01/06/2017   ALKPHOS 53 01/06/2017   Electrolytes Lab Results  Component Value Date   NA 142 01/06/2017   K 4.0 01/06/2017   CL 107 01/06/2017   CALCIUM 9.7 01/06/2017   MG 2.2 01/06/2017   Neuropathy Markers Lab Results  Component Value Date   VITAMINB12 637 01/06/2017   Bone Pathology Markers Lab Results  Component Value Date   ALKPHOS 53 01/06/2017   25OHVITD1 58 01/06/2017   25OHVITD2 <1.0 01/06/2017   25OHVITD3 58 01/06/2017   CALCIUM 9.7 01/06/2017   Coagulation Parameters No results found for: INR, LABPROT, APTT, PLT Cardiovascular Markers No results found for: BNP, HGB, HCT Note: Lab results reviewed.  Recent Diagnostic Imaging Review  Dg C-arm 1-60 Min-no Report  Result Date: 02/10/2017 Fluoroscopy was utilized by the requesting physician.  No radiographic interpretation.   Note: Imaging results reviewed.          Meds  The patient has a current medication list which includes the following prescription(s): aclidinium bromide, advair diskus, albuterol, albuterol, albuterol, aspirin ec, calcium  carb-cholecalciferol, cholecalciferol, diphenhydramine, docusate sodium, fluticasone-salmeterol, hydrochlorothiazide, hydroxyzine, ibuprofen, incruse ellipta, ipratropium-albuterol, loratadine, metformin, methocarbamol, multiple vitamins-minerals, oxycodone-acetaminophen, oxycodone-acetaminophen, oxycodone-acetaminophen, pantoprazole, pregabalin, sildenafil, sodium chloride hypertonic, trazodone, tudorza pressair, and umeclidinium bromide.  Current Outpatient Prescriptions on File Prior to Visit  Medication Sig  . Aclidinium Bromide (TUDORZA PRESSAIR IN) Inhale 40 mcg into the lungs 2 (two) times daily.  Marland Kitchen ADVAIR DISKUS 250-50 MCG/DOSE AEPB INHALE 1 PUFF 2 (TWO) TIMES A DAY.  Marland Kitchen albuterol (PROVENTIL HFA;VENTOLIN HFA) 108 (90 Base) MCG/ACT inhaler Inhale 2 puffs into the lungs every 4 (four) hours as needed.   Marland Kitchen albuterol (PROVENTIL HFA;VENTOLIN HFA) 108 (90 Base) MCG/ACT inhaler Inhale into the lungs.  Marland Kitchen albuterol (PROVENTIL) (2.5 MG/3ML) 0.083% nebulizer solution INHALE 0.5 ML (2.5 MG TOTAL) BY NEBULIZATION EVERY SIX (6) HOURS AS NEEDED FOR WHEEZING.  Marland Kitchen aspirin EC 81 MG tablet Take 81 mg by mouth daily.  . Calcium Carb-Cholecalciferol 600-800 MG-UNIT TABS TAKE 1 TABLET BY MOUTH 2 TIMES A DAY  . cholecalciferol (VITAMIN D) 1000 UNITS tablet Take 2,000 Units by mouth 2 (two) times daily.   . diphenhydrAMINE (BENADRYL) 25 mg capsule Take 25 mg by  mouth 2 (two) times daily.   . Fluticasone-Salmeterol (ADVAIR) 500-50 MCG/DOSE AEPB Inhale 1 puff into the lungs 2 (two) times daily.  . hydrochlorothiazide (MICROZIDE) 12.5 MG capsule TAKE ONE CAPSULE BY MOUTH FOR HIGH BLOOD PRESSURE  . hydrOXYzine (ATARAX/VISTARIL) 25 MG tablet Take 25 mg by mouth as needed.  . INCRUSE ELLIPTA 62.5 MCG/INH AEPB   . ipratropium-albuterol (DUONEB) 0.5-2.5 (3) MG/3ML SOLN Take 3 mLs by nebulization every 6 (six) hours as needed.  . loratadine (CLARITIN) 10 MG tablet Take 10 mg by mouth.   . metFORMIN (GLUCOPHAGE) 500 MG  tablet Take 500 mg by mouth 2 (two) times daily with a meal.  . Multiple Vitamins-Minerals (PRESERVISION AREDS 2 PO) Take 1 tablet by mouth 2 (two) times daily.  . pantoprazole (PROTONIX) 40 MG tablet Take 40 mg by mouth.  . sildenafil (VIAGRA) 100 MG tablet 100 mg.  . sodium chloride HYPERTONIC 3 % nebulizer solution INHALE 4 ML BY NEBULIZATION TWICE A DAY  . traZODone (DESYREL) 50 MG tablet Take 50 mg by mouth at bedtime. Reported on 02/20/2016  . TUDORZA PRESSAIR 400 MCG/ACT AEPB Inhale 1 puff into the lungs 2 (two) times daily.  Marland Kitchen umeclidinium bromide (INCRUSE ELLIPTA) 62.5 MCG/INH AEPB Inhale into the lungs.   No current facility-administered medications on file prior to visit.    ROS  Constitutional: Denies any fever or chills Gastrointestinal: No reported hemesis, hematochezia, vomiting, or acute GI distress Musculoskeletal: Denies any acute onset joint swelling, redness, loss of ROM, or weakness Neurological: No reported episodes of acute onset apraxia, aphasia, dysarthria, agnosia, amnesia, paralysis, loss of coordination, or loss of consciousness  Allergies  Mr. Lampert is allergic to atorvastatin and varenicline.  PFSH  Drug: Mr. Silveria  has no drug history on file. Alcohol:  reports that he does not drink alcohol. Tobacco:  reports that he has been smoking Cigarettes.  He has a 51.00 pack-year smoking history. He has never used smokeless tobacco. Medical:  has a past medical history of Anxiety; COPD (chronic obstructive pulmonary disease) ([place]); Depression; Hernia of abdominal wall; History of alcoholism ([place]) (08/08/2015); History of pneumonectomy (08/08/2015); Hypertension; Macular degeneration (08/26/2016); and Tumor cells. Family: family history includes Alcohol abuse in his father; Kidney disease in his mother.  Past Surgical History:  Procedure Laterality Date  . LUNG REMOVAL, PARTIAL    . NECK SURGERY     plate in neck- 11 years ago  . TONSILLECTOMY      Constitutional Exam  General appearance: Well nourished, well developed, and well hydrated. In no apparent acute distress Vitals:   03/27/17 0943  BP: 117/67  Pulse: 66  Resp: 18  Temp: 98.2 F (36.8 C)  SpO2: 98%  Weight: 154 lb (69.9 kg)  Height: _0  (1.753 m)   BMI Assessment: Estimated body mass index is 22.74 kg/m as calculated from the following:   Height as of this encounter: _1  (1.753 m).   Weight as of this encounter: 154 lb (69.9 kg).  BMI interpretation table: BMI level Category Range association with higher incidence of chronic pain  <18 kg/m2 Underweight   18.5-24.9 kg/m2 Ideal body weight   25-29.9 kg/m2 Overweight Increased incidence by 20%  30-34.9 kg/m2 Obese (Class I) Increased incidence by 68%  35-39.9 kg/m2 Severe obesity (Class II) Increased incidence by 136%  >40 kg/m2 Extreme obesity (Class III) Increased incidence by 254%   BMI Readings from Last 4 Encounters:  03/27/17 22.74 kg/m  03/12/17 22.45 kg/m  02/10/17 22.45 kg/m  12/31/16 23.04 kg/m   Wt Readings from Last 4 Encounters:  03/27/17 154 lb (69.9 kg)  03/12/17 152 lb (68.9 kg)  02/10/17 152 lb (68.9 kg)  12/31/16 156 lb (70.8 kg)  Psych/Mental status: Alert, oriented x 3 (person, place, & time)       Eyes: PERLA Respiratory: No evidence of acute respiratory distress  Cervical Spine Exam  Inspection: No masses, redness, or swelling Alignment: Symmetrical Functional ROM: Unrestricted ROM      Stability: No instability detected Muscle strength & Tone: Functionally intact Sensory: Unimpaired Palpation: No palpable anomalies              Upper Extremity (UE) Exam    Side: Right upper extremity  Side: Left upper extremity  Inspection: No masses, redness, swelling, or asymmetry. No contractures  Inspection: No masses, redness, swelling, or asymmetry. No contractures  Functional ROM: Unrestricted ROM          Functional ROM: Unrestricted ROM          Muscle strength & Tone:  Functionally intact  Muscle strength & Tone: Functionally intact  Sensory: Unimpaired  Sensory: Unimpaired  Palpation: No palpable anomalies              Palpation: No palpable anomalies              Specialized Test(s): Deferred         Specialized Test(s): Deferred          Thoracic Spine Exam  Inspection: No masses, redness, or swelling Alignment: Symmetrical Functional ROM: Unrestricted ROM Stability: No instability detected Sensory: Unimpaired Muscle strength & Tone: No palpable anomalies  Lumbar Spine Exam  Inspection: No masses, redness, or swelling Alignment: Symmetrical Functional ROM: Unrestricted ROM      Stability: No instability detected Muscle strength & Tone: Functionally intact Sensory: Unimpaired Palpation: No palpable anomalies       Provocative Tests: Lumbar Hyperextension and rotation test: evaluation deferred today       Patrick's Maneuver: evaluation deferred today                    Gait & Posture Assessment  Ambulation: Unassisted Gait: Relatively normal for age and body habitus Posture: WNL   Lower Extremity Exam    Side: Right lower extremity  Side: Left lower extremity  Inspection: No masses, redness, swelling, or asymmetry. No contractures  Inspection: No masses, redness, swelling, or asymmetry. No contractures  Functional ROM: Unrestricted ROM          Functional ROM: Unrestricted ROM          Muscle strength & Tone: Functionally intact  Muscle strength & Tone: Functionally intact  Sensory: Unimpaired  Sensory: Unimpaired  Palpation: No palpable anomalies  Palpation: No palpable anomalies   Assessment  Primary Diagnosis & Pertinent Problem List: The primary encounter diagnosis was Chronic pain syndrome. Diagnoses of Chronic low back pain (Location of Primary Source of Pain) (midline), Chronic lumbar radicular pain (L5/S1 dermatome) (Location of Secondary source of pain) (Right), Lumbar facet syndrome (Location of Primary Source of Pain) (Bilateral)  (R>L), Primary osteoarthritis involving multiple joints, Fibromyalgia, Neurogenic pain, Neuropathic pain, Musculoskeletal pain, Opioid-induced constipation (OIC), Long term prescription opiate use, and Opiate use (30 MME/Day) were also pertinent to this visit.  Status Diagnosis  Controlled Controlled Controlled 1. Chronic pain syndrome   2. Chronic low back pain (Location of Primary Source of Pain) (midline)   3. Chronic lumbar radicular pain (L5/S1 dermatome) (Location of Secondary  source of pain) (Right)   4. Lumbar facet syndrome (Location of Primary Source of Pain) (Bilateral) (R>L)   5. Primary osteoarthritis involving multiple joints   6. Fibromyalgia   7. Neurogenic pain   8. Neuropathic pain   9. Musculoskeletal pain   10. Opioid-induced constipation (OIC)   11. Long term prescription opiate use   12. Opiate use (30 MME/Day)     Problems updated and reviewed during this visit: No problems updated. Plan of Care  Pharmacotherapy (Medications Ordered): Meds ordered this encounter  Medications  . oxyCODONE-acetaminophen (PERCOCET/ROXICET) 5-325 MG tablet    Sig: Take 1 tablet by mouth every 6 (six) hours as needed for severe pain.    Dispense:  120 tablet    Refill:  0    Do not place this medication, or any other prescription from our practice, on "Automatic Refill". Patient may have prescription filled one day early if pharmacy is closed on scheduled refill date. Do not fill until: 04/30/17 To last until: 05/30/17  . oxyCODONE-acetaminophen (PERCOCET/ROXICET) 5-325 MG tablet    Sig: Take 1 tablet by mouth every 6 (six) hours as needed for severe pain.    Dispense:  120 tablet    Refill:  0    Do not place this medication, or any other prescription from our practice, on "Automatic Refill". Patient may have prescription filled one day early if pharmacy is closed on scheduled refill date. Do not fill until: 03/31/17 To last until: 04/30/17  . oxyCODONE-acetaminophen  (PERCOCET/ROXICET) 5-325 MG tablet    Sig: Take 1 tablet by mouth every 6 (six) hours as needed for severe pain.    Dispense:  120 tablet    Refill:  0    Do not place this medication, or any other prescription from our practice, on "Automatic Refill". Patient may have prescription filled one day early if pharmacy is closed on scheduled refill date. Do not fill until: 05/30/17 To last until: 06/29/17  . pregabalin (LYRICA) 150 MG capsule    Sig: Take 1 capsule (150 mg total) by mouth 2 (two) times daily.    Dispense:  180 capsule    Refill:  0    Do not place this medication, or any other prescription from our practice, on "Automatic Refill". Patient may have prescription filled one day early if pharmacy is closed on scheduled refill date.  . methocarbamol (ROBAXIN) 750 MG tablet    Sig: Take 1 tablet (750 mg total) by mouth every 8 (eight) hours as needed for muscle spasms.    Dispense:  90 tablet    Refill:  2    Do not place this medication, or any other prescription from our practice, on "Automatic Refill". Patient may have prescription filled one day early if pharmacy is closed on scheduled refill date.  Marland Kitchen ibuprofen (ADVIL,MOTRIN) 800 MG tablet    Sig: Take 1 tablet (800 mg total) by mouth every 8 (eight) hours as needed for moderate pain.    Dispense:  270 tablet    Refill:  0    Do not place this medication, or any other prescription from our practice, on "Automatic Refill". Patient may have prescription filled one day early if pharmacy is closed on scheduled refill date.  . docusate sodium (COLACE) 100 MG capsule    Sig: Take 1 capsule (100 mg total) by mouth daily.    Dispense:  90 capsule    Refill:  0    Do not place this medication, or  any other prescription from our practice, on "Automatic Refill". Patient may have prescription filled one day early if pharmacy is closed on scheduled refill date.   New Prescriptions   No medications on file   Medications administered  today: Mr. Mckelvin had no medications administered during this visit. Lab-work, procedure(s), and/or referral(s): Orders Placed This Encounter  Procedures  . Lumbar Epidural Injection   Imaging and/or referral(s): None  Interventional therapies: Planned, scheduled, and/or pending:   Not at this time.   Considering:   Palliative bilateral lumbar facet block  Palliative left-sided cervical epidural steroid injection  Palliative right-sided L5-S1 lumbar epidural steroid injection  Diagnostic bilateral cervical facet block  Palliative Right-sided T8-9 thoracic epidural steroid injection    Palliative PRN treatment(s):   Palliative bilateral lumbar facet block  Palliative left-sided cervical epidural steroid injection  Palliative right-sided L5-S1 lumbar epidural steroid injection    Provider-requested follow-up: Return in about 3 months (around 06/27/2017) for Med-Mgmt, by NP, in addition, PRN procedure(s), by MD.  Future Appointments Date Time Provider [place]  06/26/2017 10:30 AM Vevelyn Francois, NP [facility] None   Primary Care Physician: [facility] [place] Location: [facility] Outpatient Pain Management Facility Note by: Kathlen Brunswick. Dossie Arbour, M.D, DABA, DABAPM, DABPM, DABIPP, FIPP Date: 03/27/2017; Time: 10:54 AM  Patient instructions provided during this appointment: Patient Instructions    ____________________________________________________________________________________________  Medication Rules  Applies to: All patients receiving prescriptions (written or electronic).  Pharmacy of record: Pharmacy where electronic prescriptions will be sent. If written prescriptions are taken to a different pharmacy, please inform the nursing staff. The pharmacy listed in the electronic medical record should be the one where you would like electronic prescriptions to be sent.  Prescription refills: Only during scheduled appointments. Applies to both, written and electronic  prescriptions.  NOTE: The following applies primarily to controlled substances (Opioid Pain Medications)  Patient's responsibilities: 1. Pain Pills: Bring all pain pills to every appointment (except for procedure appointments). 2. Pill Bottles: Bring pills in original pharmacy bottle. Always bring newest bottle. Bring bottle, even if empty. 3. Medication refills: You are responsible for knowing and keeping track of what medications you need refilled. The day before your appointment, write a list of all prescriptions that need to be refilled. Bring that list to your appointment and give it to the admitting nurse. Prescriptions will be written only during appointments. If you forget a medication, it will not be "Called in", "Faxed", or "electronically sent". You will need to get another appointment to get these prescribed. 4. Prescription Accuracy: You are responsible for carefully inspecting your prescriptions before leaving our office. Have the discharge nurse carefully go over each prescription with you, before taking them home. Make sure that your name is accurately spelled, that your address is correct. Check the name and dose of your medication to make sure it is accurate. Check the number of pills, and the written instructions to make sure they are clear and accurate. Make sure that you are given enough medication to last until your next medication refill appointment. 5. Taking Medication: Take medication as prescribed. Never take more pills than instructed. Never take medication more frequently than prescribed. Taking less pills or less frequently is permitted and encouraged, when it comes to controlled substances (written prescriptions).  6. Inform other Doctors: Always inform, all of your healthcare providers, of all the medications you take. 7. Pain Medication from other Providers: You are not allowed to accept any additional pain medication from any other Doctor  or Healthcare provider. There are  two exceptions to this rule. (see below) In the event that you require additional pain medication, you are responsible for notifying us, as stated below. 8. Medication Agreement: You are responsible for carefully reading and following our Medication Agreement. This must be signed before receiving any prescriptions from our practice. Safely store a copy of your signed Agreement. Violations to the Agreement will result in no further prescriptions. (Additional copies of our Medication Agreement are available upon request.) 9. Laws, Rules, & Regulations: All patients are expected to follow all Federal and [employer], Rules, [employer]. Ignorance of the Laws does not constitute a valid excuse.  Exceptions: There are only two exceptions to the rule of not receiving pain medications from other Healthcare Providers. 1. Exception #1 (Emergencies): In the event of an emergency (i.e.: accident requiring emergency care), you are allowed to receive additional pain medication. However, you are responsible for: As soon as you are able, call our office [phone], at any time of the day or night, and leave a message stating your name, the date and nature of the emergency, and the name and dose of the medication prescribed. In the event that your call is answered by a member of our staff, make sure to document and save the date, time, and the name of the person that took your information.  2. Exception #2 (Planned Surgery): In the event that you are scheduled by another doctor or dentist to have any type of surgery or procedure, you are allowed (for a period no longer than 30 days), to receive additional pain medication, for the acute post-op pain. However, in this case, you are responsible for picking up a copy of our "Post-op Pain Management for Surgeons" handout, and giving it to your surgeon or dentist. This document is available at our office, and does not require an appointment to obtain it. Simply go to  our office during business hours (Monday-Thursday from 8:00 AM to 4:00 PM) (Friday 8:00 AM to 12:00 Noon) or if you have a scheduled appointment with [place], prior to your surgery, and ask for it by name. In addition, you will need to provide [place] with your name, name of your surgeon, type of surgery, and date of procedure or surgery.  ____________________________________________________________________________________________ ____________________________________________________________________________________________  Pain Scale  Introduction: The pain score used by this practice is the Verbal Numerical Rating Scale (VNRS-11). This is an 11-point scale. It is for adults and children 10 years or older. There are significant differences in how the pain score is reported, used, and applied. Forget everything you learned in the past and learn this scoring system.  General Information: The scale should reflect your current level of pain. Unless you are specifically asked for the level of your worst pain, or your average pain. If you are asked for one of these two, then it should be understood that it is over the past 24 hours.  Basic Activities of Daily Living (ADL): Personal hygiene, dressing, eating, transferring, and using restroom.  Instructions: Most patients tend to report their level of pain as a combination of two factors, their physical pain and their psychosocial pain. This last one is also known as "suffering" and it is reflection of how physical pain affects you socially and psychologically. From now on, report them separately. From this point on, when asked to report your pain level, report only your physical pain. Use the following table for reference.  Pain Clinic Pain Levels (0-5/10)  Pain Level Score  Description  No Pain 0   Mild pain 1 Nagging, annoying, but does not interfere with basic activities of daily living (ADL). Patients are able to eat, bathe, get dressed, toileting (being able  to get on and off the toilet and perform personal hygiene functions), transfer (move in and out of bed or a chair without assistance), and maintain continence (able to control bladder and bowel functions). Blood pressure and heart rate are unaffected. A normal heart rate for a healthy adult ranges from 60 to 100 bpm (beats per minute).   Mild to moderate pain 2 Noticeable and distracting. Impossible to hide from other people. More frequent flare-ups. Still possible to adapt and function close to normal. It can be very annoying and may have occasional stronger flare-ups. With discipline, patients may get used to it and adapt.   Moderate pain 3 Interferes significantly with activities of daily living (ADL). It becomes difficult to feed, bathe, get dressed, get on and off the toilet or to perform personal hygiene functions. Difficult to get in and out of bed or a chair without assistance. Very distracting. With effort, it can be ignored when deeply involved in activities.   Moderately severe pain 4 Impossible to ignore for more than a few minutes. With effort, patients may still be able to manage work or participate in some social activities. Very difficult to concentrate. Signs of autonomic nervous system discharge are evident: dilated pupils (mydriasis); mild sweating (diaphoresis); sleep interference. Heart rate becomes elevated (>115 bpm). Diastolic blood pressure (lower number) rises above 100 mmHg. Patients find relief in laying down and not moving.   Severe pain 5 Intense and extremely unpleasant. Associated with frowning face and frequent crying. Pain overwhelms the senses.  Ability to do any activity or maintain social relationships becomes significantly limited. Conversation becomes difficult. Pacing back and forth is common, as getting into a comfortable position is nearly impossible. Pain wakes you up from deep sleep. Physical signs will be obvious: pupillary dilation; increased sweating;  goosebumps; brisk reflexes; cold, clammy hands and feet; nausea, vomiting or dry heaves; loss of appetite; significant sleep disturbance with inability to fall asleep or to remain asleep. When persistent, significant weight loss is observed due to the complete loss of appetite and sleep deprivation.  Blood pressure and heart rate becomes significantly elevated. Caution: If elevated blood pressure triggers a pounding headache associated with blurred vision, then the patient should immediately seek attention at an urgent or emergency care unit, as these may be signs of an impending stroke.    Emergency Department Pain Levels (6-10/10)  Emergency Room Pain 6 Severely limiting. Requires emergency care and should not be seen or managed at an outpatient pain management facility. Communication becomes difficult and requires great effort. Assistance to reach the emergency department may be required. Facial flushing and profuse sweating along with potentially dangerous increases in heart rate and blood pressure will be evident.   Distressing pain 7 Self-care is very difficult. Assistance is required to transport, or use restroom. Assistance to reach the emergency department will be required. Tasks requiring coordination, such as bathing and getting dressed become very difficult.   Disabling pain 8 Self-care is no longer possible. At this level, pain is disabling. The individual is unable to do even the most "basic" activities such as walking, eating, bathing, dressing, transferring to a bed, or toileting. Fine motor skills are lost. It is difficult to think clearly.   Incapacitating pain 9 Pain becomes incapacitating.  Thought processing is no longer possible. Difficult to remember your own name. Control of movement and coordination are lost.   The worst pain imaginable 10 At this level, most patients pass out from pain. When this level is reached, collapse of the autonomic nervous system occurs, leading to a  sudden drop in blood pressure and heart rate. This in turn results in a temporary and dramatic drop in blood flow to the brain, leading to a loss of consciousness. Fainting is one of the body's self defense mechanisms. Passing out puts the brain in a calmed state and causes it to shut down for a while, in order to begin the healing process.    Summary: 1. Refer to this scale when providing [place] with your pain level. 2. Be accurate and careful when reporting your pain level. This will help with your care. 3. Over-reporting your pain level will lead to loss of credibility. 4. Even a level of 1/10 means that there is pain and will be treated at our facility. 5. High, inaccurate reporting will be documented as "Symptom Exaggeration", leading to loss of credibility and suspicions of possible secondary gains such as obtaining more narcotics, or wanting to appear disabled, for fraudulent reasons. 6. Only pain levels of 5 or below will be seen at our facility. 7. Pain levels of 6 and above will be sent to the Emergency Department and the appointment cancelled. ____________________________________________________________________________________________   ____________________________________________________________________________________________  Appointment Policy  It is our goal and responsibility to provide the medical community with assistance in the evaluation and management of patients with chronic pain. Unfortunately our resources are limited. Because we do not have an unlimited amount of time, or available appointments, we are required to closely monitor and manage their use. The following rules exist to maximize their use:  Patient's responsibilities: 1. Punctuality: You are required to be physically present and registered in our facility at least 30 minutes before your appointment. 2. Tardiness: The cutoff is your appointment time. If you have an appointment scheduled for 10:00 AM and you arrive  at 10:01, you will be required to reschedule your appointment.  3. Plan ahead: Always assume that you will encounter traffic on your way in. Plan for it. If you are dependent on a driver, make sure they understand these rules and the need to arrive early. 4. Other appointments and responsibilities: Avoid scheduling any other appointments before or after your pain clinic appointments.  5. Be prepared: Write down everything that you need to discuss with your healthcare provider and give this information to the admitting nurse. Write down the medications that you will need refilled. Bring your pills and bottles (even the empty ones), to all of your appointments, except for those where a procedure is scheduled. 6. No children or pets: Find someone to take care of them. It is not appropriate to bring them in. 7. Scheduling changes: We request "advanced notification" of any changes or cancellations. 8. Advanced notification: Defined as a time period of more than 24 hours prior to the originally scheduled appointment. This allows for the appointment to be offered to other patients. 9. Rescheduling: When a visit is rescheduled, it will require the cancellation of the original appointment. For this reason they both fall within the category of "Cancellations".  10. Cancellations: They require advanced notification. Any cancellation less than 24 hours before the  appointment will be recorded as a "No Show". 11. No Show: Defined as an unkept appointment where the patient failed to notify  or declare to the practice their intention or inability to keep the appointment.  Corrective process for repeat offenders:  1. Tardiness: Three (3) episodes of rescheduling due to late arrivals will be recorded as one (1) "No Show". 2. Cancellation or reschedule: Three (3) cancellations or rescheduling will be recorded as one (1) "No Show". 3. "No Shows": Three (3) "No Shows" within a 12 month period will result in discharge from  the practice.  ____________________________________________________________________________________________  ____________________________________________________________________________________________  Pain Prevention Technique  Definition:   A technique used to minimize the effects of an activity known to cause inflammation or swelling, which in turn leads to an increase in pain.  Purpose: To prevent swelling from occurring. It is based on the fact that it is easier to prevent swelling from happening than it is to get rid of it, once it occurs.  Contraindications: 1. Anyone with allergy or hypersensitivity to the recommended medications. 2. Anyone taking anticoagulants (Blood Thinners) (e.g., Coumadin, Warfarin, Plavix, etc.). 3. Patients in Renal Failure.  Technique: Before you undertake an activity known to cause pain, or a flare-up of your chronic pain, and before you experience any pain, do the following:  1. On a full stomach, take 4 (four) over the counter Ibuprofens 2108m tablets (Motrin), for a total of 800 mg. 2. In addition, take over the counter Magnesium 400 to 500 mg, before doing the activity.  3. Six (6) hours later, again on a full stomach, repeat the Ibuprofen. 4. That night, take a warm shower and stretch under the running warm water.  This technique may be sufficient to abort the pain and discomfort before it happens. Keep in mind that it takes a lot less medication to prevent swelling than it takes to eliminate it once it occurs.  ____________________________________________________________________________________________

## 2017-03-27 NOTE — Patient Instructions (Addendum)
____________________________________________________________________________________________  Medication Rules  Applies to: All patients receiving prescriptions (written or electronic).  Pharmacy of record: Pharmacy where electronic prescriptions will be sent. If written prescriptions are taken to a different pharmacy, please inform the nursing staff. The pharmacy listed in the electronic medical record should be the one where you would like electronic prescriptions to be sent.  Prescription refills: Only during scheduled appointments. Applies to both, written and electronic prescriptions.  NOTE: The following applies primarily to controlled substances (Opioid Pain Medications)  Patient's responsibilities: 1. Pain Pills: Bring all pain pills to every appointment (except for procedure appointments). 2. Pill Bottles: Bring pills in original pharmacy bottle. Always bring newest bottle. Bring bottle, even if empty. 3. Medication refills: You are responsible for knowing and keeping track of what medications you need refilled. The day before your appointment, write a list of all prescriptions that need to be refilled. Bring that list to your appointment and give it to the admitting nurse. Prescriptions will be written only during appointments. If you forget a medication, it will not be "Called in", "Faxed", or "electronically sent". You will need to get another appointment to get these prescribed. 4. Prescription Accuracy: You are responsible for carefully inspecting your prescriptions before leaving our office. Have the discharge nurse carefully go over each prescription with you, before taking them home. Make sure that your name is accurately spelled, that your address is correct. Check the name and dose of your medication to make sure it is accurate. Check the number of pills, and the written instructions to make sure they are clear and accurate. Make sure that you are given enough medication to last  until your next medication refill appointment. 5. Taking Medication: Take medication as prescribed. Never take more pills than instructed. Never take medication more frequently than prescribed. Taking less pills or less frequently is permitted and encouraged, when it comes to controlled substances (written prescriptions).  6. Inform other Doctors: Always inform, all of your healthcare providers, of all the medications you take. 7. Pain Medication from other Providers: You are not allowed to accept any additional pain medication from any other Doctor or Healthcare provider. There are two exceptions to this rule. (see below) In the event that you require additional pain medication, you are responsible for notifying us, as stated below. 8. Medication Agreement: You are responsible for carefully reading and following our Medication Agreement. This must be signed before receiving any prescriptions from our practice. Safely store a copy of your signed Agreement. Violations to the Agreement will result in no further prescriptions. (Additional copies of our Medication Agreement are available upon request.) 9. Laws, Rules, & Regulations: All patients are expected to follow all Federal and [employer], Rules, [employer]. Ignorance of the Laws does not constitute a valid excuse.  Exceptions: There are only two exceptions to the rule of not receiving pain medications from other Healthcare Providers. 1. Exception #1 (Emergencies): In the event of an emergency (i.e.: accident requiring emergency care), you are allowed to receive additional pain medication. However, you are responsible for: As soon as you are able, call our office [phone], at any time of the day or night, and leave a message stating your name, the date and nature of the emergency, and the name and dose of the medication prescribed. In the event that your call is answered by a member of our staff, make sure to document and save the date,  time, and the name of the person that  took your information.  2. Exception #2 (Planned Surgery): In the event that you are scheduled by another doctor or dentist to have any type of surgery or procedure, you are allowed (for a period no longer than 30 days), to receive additional pain medication, for the acute post-op pain. However, in this case, you are responsible for picking up a copy of our "Post-op Pain Management for Surgeons" handout, and giving it to your surgeon or dentist. This document is available at our office, and does not require an appointment to obtain it. Simply go to our office during business hours (Monday-Thursday from 8:00 AM to 4:00 PM) (Friday 8:00 AM to 12:00 Noon) or if you have a scheduled appointment with [place], prior to your surgery, and ask for it by name. In addition, you will need to provide [place] with your name, name of your surgeon, type of surgery, and date of procedure or surgery.  ____________________________________________________________________________________________ ____________________________________________________________________________________________  Pain Scale  Introduction: The pain score used by this practice is the Verbal Numerical Rating Scale (VNRS-11). This is an 11-point scale. It is for adults and children 10 years or older. There are significant differences in how the pain score is reported, used, and applied. Forget everything you learned in the past and learn this scoring system.  General Information: The scale should reflect your current level of pain. Unless you are specifically asked for the level of your worst pain, or your average pain. If you are asked for one of these two, then it should be understood that it is over the past 24 hours.  Basic Activities of Daily Living (ADL): Personal hygiene, dressing, eating, transferring, and using restroom.  Instructions: Most patients tend to report their level of pain as a combination of two factors,  their physical pain and their psychosocial pain. This last one is also known as "suffering" and it is reflection of how physical pain affects you socially and psychologically. From now on, report them separately. From this point on, when asked to report your pain level, report only your physical pain. Use the following table for reference.  Pain Clinic Pain Levels (0-5/10)  Pain Level Score  Description  No Pain 0   Mild pain 1 Nagging, annoying, but does not interfere with basic activities of daily living (ADL). Patients are able to eat, bathe, get dressed, toileting (being able to get on and off the toilet and perform personal hygiene functions), transfer (move in and out of bed or a chair without assistance), and maintain continence (able to control bladder and bowel functions). Blood pressure and heart rate are unaffected. A normal heart rate for a healthy adult ranges from 60 to 100 bpm (beats per minute).   Mild to moderate pain 2 Noticeable and distracting. Impossible to hide from other people. More frequent flare-ups. Still possible to adapt and function close to normal. It can be very annoying and may have occasional stronger flare-ups. With discipline, patients may get used to it and adapt.   Moderate pain 3 Interferes significantly with activities of daily living (ADL). It becomes difficult to feed, bathe, get dressed, get on and off the toilet or to perform personal hygiene functions. Difficult to get in and out of bed or a chair without assistance. Very distracting. With effort, it can be ignored when deeply involved in activities.   Moderately severe pain 4 Impossible to ignore for more than a few minutes. With effort, patients may still be able to manage work or participate in some  social activities. Very difficult to concentrate. Signs of autonomic nervous system discharge are evident: dilated pupils (mydriasis); mild sweating (diaphoresis); sleep interference. Heart rate becomes elevated  (>115 bpm). Diastolic blood pressure (lower number) rises above 100 mmHg. Patients find relief in laying down and not moving.   Severe pain 5 Intense and extremely unpleasant. Associated with frowning face and frequent crying. Pain overwhelms the senses.  Ability to do any activity or maintain social relationships becomes significantly limited. Conversation becomes difficult. Pacing back and forth is common, as getting into a comfortable position is nearly impossible. Pain wakes you up from deep sleep. Physical signs will be obvious: pupillary dilation; increased sweating; goosebumps; brisk reflexes; cold, clammy hands and feet; nausea, vomiting or dry heaves; loss of appetite; significant sleep disturbance with inability to fall asleep or to remain asleep. When persistent, significant weight loss is observed due to the complete loss of appetite and sleep deprivation.  Blood pressure and heart rate becomes significantly elevated. Caution: If elevated blood pressure triggers a pounding headache associated with blurred vision, then the patient should immediately seek attention at an urgent or emergency care unit, as these may be signs of an impending stroke.    Emergency Department Pain Levels (6-10/10)  Emergency Room Pain 6 Severely limiting. Requires emergency care and should not be seen or managed at an outpatient pain management facility. Communication becomes difficult and requires great effort. Assistance to reach the emergency department may be required. Facial flushing and profuse sweating along with potentially dangerous increases in heart rate and blood pressure will be evident.   Distressing pain 7 Self-care is very difficult. Assistance is required to transport, or use restroom. Assistance to reach the emergency department will be required. Tasks requiring coordination, such as bathing and getting dressed become very difficult.   Disabling pain 8 Self-care is no longer possible. At this level,  pain is disabling. The individual is unable to do even the most "basic" activities such as walking, eating, bathing, dressing, transferring to a bed, or toileting. Fine motor skills are lost. It is difficult to think clearly.   Incapacitating pain 9 Pain becomes incapacitating. Thought processing is no longer possible. Difficult to remember your own name. Control of movement and coordination are lost.   The worst pain imaginable 10 At this level, most patients pass out from pain. When this level is reached, collapse of the autonomic nervous system occurs, leading to a sudden drop in blood pressure and heart rate. This in turn results in a temporary and dramatic drop in blood flow to the brain, leading to a loss of consciousness. Fainting is one of the body's self defense mechanisms. Passing out puts the brain in a calmed state and causes it to shut down for a while, in order to begin the healing process.    Summary: 1. Refer to this scale when providing [place] with your pain level. 2. Be accurate and careful when reporting your pain level. This will help with your care. 3. Over-reporting your pain level will lead to loss of credibility. 4. Even a level of 1/10 means that there is pain and will be treated at our facility. 5. High, inaccurate reporting will be documented as "Symptom Exaggeration", leading to loss of credibility and suspicions of possible secondary gains such as obtaining more narcotics, or wanting to appear disabled, for fraudulent reasons. 6. Only pain levels of 5 or below will be seen at our facility. 7. Pain levels of 6 and above will be sent  to the Emergency Department and the appointment cancelled. ____________________________________________________________________________________________   ____________________________________________________________________________________________  Appointment Policy  It is our goal and responsibility to provide the medical community with  assistance in the evaluation and management of patients with chronic pain. Unfortunately our resources are limited. Because we do not have an unlimited amount of time, or available appointments, we are required to closely monitor and manage their use. The following rules exist to maximize their use:  Patient's responsibilities: 1. Punctuality: You are required to be physically present and registered in our facility at least 30 minutes before your appointment. 2. Tardiness: The cutoff is your appointment time. If you have an appointment scheduled for 10:00 AM and you arrive at 10:01, you will be required to reschedule your appointment.  3. Plan ahead: Always assume that you will encounter traffic on your way in. Plan for it. If you are dependent on a driver, make sure they understand these rules and the need to arrive early. 4. Other appointments and responsibilities: Avoid scheduling any other appointments before or after your pain clinic appointments.  5. Be prepared: Write down everything that you need to discuss with your healthcare provider and give this information to the admitting nurse. Write down the medications that you will need refilled. Bring your pills and bottles (even the empty ones), to all of your appointments, except for those where a procedure is scheduled. 6. No children or pets: Find someone to take care of them. It is not appropriate to bring them in. 7. Scheduling changes: We request "advanced notification" of any changes or cancellations. 8. Advanced notification: Defined as a time period of more than 24 hours prior to the originally scheduled appointment. This allows for the appointment to be offered to other patients. 9. Rescheduling: When a visit is rescheduled, it will require the cancellation of the original appointment. For this reason they both fall within the category of "Cancellations".  10. Cancellations: They require advanced notification. Any cancellation less than 24  hours before the  appointment will be recorded as a "No Show". 11. No Show: Defined as an unkept appointment where the patient failed to notify or declare to the practice their intention or inability to keep the appointment.  Corrective process for repeat offenders:  1. Tardiness: Three (3) episodes of rescheduling due to late arrivals will be recorded as one (1) "No Show". 2. Cancellation or reschedule: Three (3) cancellations or rescheduling will be recorded as one (1) "No Show". 3. "No Shows": Three (3) "No Shows" within a 12 month period will result in discharge from the practice.  ____________________________________________________________________________________________  ____________________________________________________________________________________________  Pain Prevention Technique  Definition:   A technique used to minimize the effects of an activity known to cause inflammation or swelling, which in turn leads to an increase in pain.  Purpose: To prevent swelling from occurring. It is based on the fact that it is easier to prevent swelling from happening than it is to get rid of it, once it occurs.  Contraindications: 1. Anyone with allergy or hypersensitivity to the recommended medications. 2. Anyone taking anticoagulants (Blood Thinners) (e.g., Coumadin, Warfarin, Plavix, etc.). 3. Patients in Renal Failure.  Technique: Before you undertake an activity known to cause pain, or a flare-up of your chronic pain, and before you experience any pain, do the following:  1. On a full stomach, take 4 (four) over the counter Ibuprofens 232m tablets (Motrin), for a total of 800 mg. 2. In addition, take over the counter Magnesium 400 to 500  mg, before doing the activity.  3. Six (6) hours later, again on a full stomach, repeat the Ibuprofen. 4. That night, take a warm shower and stretch under the running warm water.  This technique may be sufficient to abort the pain and  discomfort before it happens. Keep in mind that it takes a lot less medication to prevent swelling than it takes to eliminate it once it occurs.  ____________________________________________________________________________________________

## 2017-04-22 NOTE — Telephone Encounter (Signed)
NA

## 2017-04-30 ENCOUNTER — Ambulatory Visit
Admission: RE | Admit: 2017-04-30 | Discharge: 2017-04-30 | Disposition: A | Discharge status: Home / Self Care | Payer: Medicare Other | Source: Ambulatory Visit | Attending: Pain Medicine | Admitting: Pain Medicine

## 2017-04-30 ENCOUNTER — Ambulatory Visit (HOSPITAL_BASED_OUTPATIENT_CLINIC_OR_DEPARTMENT_OTHER): Payer: Medicare Other | Admitting: Pain Medicine

## 2017-04-30 ENCOUNTER — Encounter: Payer: Self-pay | Admitting: Pain Medicine

## 2017-04-30 VITALS — BP 182/82 | HR 61 | Temp 99.0°F | Resp 19 | Ht 69.0 in | Wt 152.0 lb

## 2017-04-30 DIAGNOSIS — M5126 Other intervertebral disc displacement, lumbar region: Secondary | ICD-10-CM | POA: Diagnosis not present

## 2017-04-30 DIAGNOSIS — M48061 Spinal stenosis, lumbar region without neurogenic claudication: Secondary | ICD-10-CM | POA: Insufficient documentation

## 2017-04-30 DIAGNOSIS — M5030 Other cervical disc degeneration, unspecified cervical region: Secondary | ICD-10-CM | POA: Diagnosis not present

## 2017-04-30 DIAGNOSIS — M47812 Spondylosis without myelopathy or radiculopathy, cervical region: Secondary | ICD-10-CM

## 2017-04-30 DIAGNOSIS — M47816 Spondylosis without myelopathy or radiculopathy, lumbar region: Secondary | ICD-10-CM | POA: Insufficient documentation

## 2017-04-30 DIAGNOSIS — M5416 Radiculopathy, lumbar region: Secondary | ICD-10-CM | POA: Insufficient documentation

## 2017-04-30 DIAGNOSIS — M9983 Other biomechanical lesions of lumbar region: Secondary | ICD-10-CM

## 2017-04-30 DIAGNOSIS — M5412 Radiculopathy, cervical region: Secondary | ICD-10-CM | POA: Insufficient documentation

## 2017-04-30 DIAGNOSIS — G8929 Other chronic pain: Secondary | ICD-10-CM

## 2017-04-30 DIAGNOSIS — Z888 Allergy status to other drugs, medicaments and biological substances status: Secondary | ICD-10-CM | POA: Diagnosis not present

## 2017-04-30 MED ORDER — IOPAMIDOL (ISOVUE-M 200) INJECTION 41%
INTRAMUSCULAR | Status: AC
  Filled 2017-04-30: qty 10

## 2017-04-30 MED ORDER — LIDOCAINE HCL (PF) 1 % IJ SOLN
10.0000 mL | Freq: Once | INTRAMUSCULAR | Status: AC
  Administered 2017-04-30: 5 mL

## 2017-04-30 MED ORDER — TRIAMCINOLONE ACETONIDE 40 MG/ML IJ SUSP
INTRAMUSCULAR | Status: AC
Start: 2017-04-30 — End: ?
  Filled 2017-04-30: qty 1

## 2017-04-30 MED ORDER — SODIUM CHLORIDE 0.9% FLUSH
2.0000 mL | Freq: Once | INTRAVENOUS | Status: AC
  Administered 2017-04-30: 10 mL

## 2017-04-30 MED ORDER — IOHEXOL 180 MG/ML  SOLN: 10.0000 mL | Freq: Once | INTRAMUSCULAR | Status: DC

## 2017-04-30 MED ORDER — ROPIVACAINE HCL 2 MG/ML IJ SOLN
INTRAMUSCULAR | Status: AC
  Filled 2017-04-30: qty 10

## 2017-04-30 MED ORDER — SODIUM CHLORIDE 0.9 % IJ SOLN
INTRAMUSCULAR | Status: AC
  Filled 2017-04-30: qty 10

## 2017-04-30 MED ORDER — ROPIVACAINE HCL 5 MG/ML IJ SOLN
1.0000 mL | Freq: Once | INTRAMUSCULAR | Status: DC
  Filled 2017-04-30: qty 1

## 2017-04-30 MED ORDER — LIDOCAINE HCL (PF) 1 % IJ SOLN: 5.0000 mL | Freq: Once | INTRAMUSCULAR | Status: DC

## 2017-04-30 MED ORDER — TRIAMCINOLONE ACETONIDE 40 MG/ML IJ SUSP
40.0000 mg | Freq: Once | INTRAMUSCULAR | Status: AC
  Administered 2017-04-30: 40 mg

## 2017-04-30 NOTE — Progress Notes (Signed)
Patient's Name: Rick GLADDEN Sr.  MRN: [id number]  Referring Provider: [facility], [place]  DOB: 1951-04-18  PCP: [facility], [place]  DOS: 04/30/2017  Note by: Gaspar Cola, MD  Service setting: Ambulatory outpatient  Specialty: Interventional Pain Management  Patient type: Established  Location: [facility] (AMB) Pain Management Facility  Visit type: Interventional Procedure   Primary Reason for Visit: Interventional Pain Management Treatment. CC: Back Pain (lower) and Leg Pain (goes to the top of the foot)  Procedure:  Anesthesia, Analgesia, Anxiolysis:  Type: Palliative Inter-Laminar Epidural Steroid Injection Region: Lumbar Level: L4-5 Level. Laterality: Right Paramedial  Type: Local Anesthesia Local Anesthetic: Lidocaine 1% Route: Infiltration ([place]/IM) IV Access: Declined Sedation: Declined  Indication(s): Analgesia          Indications: 1. Chronic lumbar radicular pain (L5/S1 dermatome) (Location of Secondary source of pain) (Right)   2. 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 Lumbar foraminal stenosis (L5-S1) (Right)   4. Lumbar spondylosis   5. Chronic cervical radicular pain (Left)   6. Degeneration of intervertebral disc of cervical region   7. Cervical spondylosis    Pain Score: Pre-procedure: 4 /10 Post-procedure: 4 /10  Pre-op Assessment:  Previous date of service: 03/27/17 Service provided: Med Refill Rick Carter is a 66 y.o. (year old), male patient, seen today for interventional treatment. He  has a past surgical history that includes Tonsillectomy; Lung removal, partial; and Neck surgery. Rick Carter has a current medication list which includes the following prescription(s): aclidinium bromide, advair diskus, albuterol, aspirin ec, calcium carb-cholecalciferol, cholecalciferol, diphenhydramine, docusate sodium, fluticasone-salmeterol, hydrochlorothiazide, hydroxyzine, ibuprofen, ipratropium-albuterol, loratadine, metformin, methocarbamol, multiple vitamins-minerals,  oxycodone-acetaminophen, oxycodone-acetaminophen, oxycodone-acetaminophen, pantoprazole, pregabalin, sildenafil, sodium chloride hypertonic, trazodone, tudorza pressair, and umeclidinium bromide, and the following Facility-Administered Medications: iohexol, lidocaine (pf), and ropivacaine (pf) 5 mg/ml (0.5%). His primarily concern today is the Back Pain (lower) and Leg Pain (goes to the top of the foot)  Initial Vital Signs: Blood pressure (!) 162/74, pulse 88, temperature 99 F (37.2 C), resp. rate 16, height _0  (1.753 m), weight 152 lb (68.9 kg), SpO2 96 %. BMI: 22.45 kg/m  Risk Assessment: Allergies: Reviewed. He is allergic to atorvastatin and varenicline.  Allergy Precautions: None required Coagulopathies: Reviewed. None identified.  Blood-thinner therapy: None at this time Active Infection(s): Reviewed. None identified. Rick Carter is afebrile  Site Confirmation: Rick Carter was asked to confirm the procedure and laterality before marking the site Procedure checklist: Completed Consent: Before the procedure and under the influence of no sedative(s), amnesic(s), or anxiolytics, the patient was informed of the treatment options, risks and possible complications. To fulfill our ethical and legal obligations, as recommended by the American Medical Association's Code of Ethics, I have informed the patient of my clinical impression; the nature and purpose of the treatment or procedure; the risks, benefits, and possible complications of the intervention; the alternatives, including doing nothing; the risk(s) and benefit(s) of the alternative treatment(s) or procedure(s); and the risk(s) and benefit(s) of doing nothing. The patient was provided information about the general risks and possible complications associated with the procedure. These may include, but are not limited to: failure to achieve desired goals, infection, bleeding, organ or nerve damage, allergic reactions, paralysis, and death. In  addition, the patient was informed of those risks and complications associated to Spine-related procedures, such as failure to decrease pain; infection (i.e.: Meningitis, epidural or intraspinal abscess); bleeding (i.e.: epidural hematoma, subarachnoid hemorrhage, or any other type of intraspinal or peri-dural bleeding); organ or  nerve damage (i.e.: Any type of peripheral nerve, nerve root, or spinal cord injury) with subsequent damage to sensory, motor, and/or autonomic systems, resulting in permanent pain, numbness, and/or weakness of one or several areas of the body; allergic reactions; (i.e.: anaphylactic reaction); and/or death. Furthermore, the patient was informed of those risks and complications associated with the medications. These include, but are not limited to: allergic reactions (i.e.: anaphylactic or anaphylactoid reaction(s)); adrenal axis suppression; blood sugar elevation that in diabetics may result in ketoacidosis or comma; water retention that in patients with history of congestive heart failure may result in shortness of breath, pulmonary edema, and decompensation with resultant heart failure; weight gain; swelling or edema; medication-induced neural toxicity; particulate matter embolism and blood vessel occlusion with resultant organ, and/or nervous system infarction; and/or aseptic necrosis of one or more joints. Finally, the patient was informed that Medicine is not an exact science; therefore, there is also the possibility of unforeseen or unpredictable risks and/or possible complications that may result in a catastrophic outcome. The patient indicated having understood very clearly. We have given the patient no guarantees and we have made no promises. Enough time was given to the patient to ask questions, all of which were answered to the patient's satisfaction. Rick Carter has indicated that he wanted to continue with the procedure. Attestation: I, the ordering provider, attest that I  have discussed with the patient the benefits, risks, side-effects, alternatives, likelihood of achieving goals, and potential problems during recovery for the procedure that I have provided informed consent. Date: 04/30/2017; Time: 9:20 AM  Pre-Procedure Preparation:  Monitoring: As per clinic protocol. Respiration, ETCO2, SpO2, BP, heart rate and rhythm monitor placed and checked for adequate function Safety Precautions: Patient was assessed for positional comfort and pressure points before starting the procedure. Time-out: I initiated and conducted the "Time-out" before starting the procedure, as per protocol. The patient was asked to participate by confirming the accuracy of the "Time Out" information. Verification of the correct person, site, and procedure were performed and confirmed by me, the nursing staff, and the patient. "Time-out" conducted as per Joint Commission's Universal Protocol (UP.01.01.01). "Time-out" Date & Time: 04/30/2017; 0941 hrs.  Description of Procedure Process:   Position: Prone with head of the table was raised to facilitate breathing. Target Area: The interlaminar space, initially targeting the lower laminar border of the superior vertebral body. Approach: Paramedial approach. Area Prepped: Entire Posterior Lumbar Region Prepping solution: ChloraPrep (2% chlorhexidine gluconate and 70% isopropyl alcohol) Safety Precautions: Aspiration looking for blood return was conducted prior to all injections. At no point did we inject any substances, as a needle was being advanced. No attempts were made at seeking any paresthesias. Safe injection practices and needle disposal techniques used. Medications properly checked for expiration dates. SDV (single dose vial) medications used. Description of the Procedure: Protocol guidelines were followed. The procedure needle was introduced through the skin, ipsilateral to the reported pain, and advanced to the target area. Bone was contacted  and the needle walked caudad, until the lamina was cleared. The epidural space was identified using "loss-of-resistance technique" with 2-3 ml of PF-NaCl (0.9% NSS), in a 5cc LOR glass syringe. Vitals:   04/30/17 0844 04/30/17 0944 04/30/17 0955  BP: (!) 162/74 (!) 177/83 (!) 182/82  Pulse: 88 (!) 59 61  Resp: _0 Temp: 99 F (37.2 C)    SpO2: 96% 100% 99%  Weight: 152 lb (68.9 kg)    Height: _1  (1.753 m)  Start Time: 0942 hrs. End Time: 0948 hrs. Materials:  Needle(s) Type: Epidural needle Gauge: 17G Length: 3.5-in Medication(s): We administered lidocaine (PF), triamcinolone acetonide, and sodium chloride flush. Please see chart orders for dosing details.  Imaging Guidance (Spinal):  Type of Imaging Technique: Fluoroscopy Guidance (Spinal) Indication(s): Assistance in needle guidance and placement for procedures requiring needle placement in or near specific anatomical locations not easily accessible without such assistance. Exposure Time: Please see nurses notes. Contrast: Before injecting any contrast, we confirmed that the patient did not have an allergy to iodine, shellfish, or radiological contrast. Once satisfactory needle placement was completed at the desired level, radiological contrast was injected. Contrast injected under live fluoroscopy. No contrast complications. See chart for type and volume of contrast used. Fluoroscopic Guidance: I was personally present during the use of fluoroscopy. "Tunnel Vision Technique" used to obtain the best possible view of the target area. Parallax error corrected before commencing the procedure. "Direction-depth-direction" technique used to introduce the needle under continuous pulsed fluoroscopy. Once target was reached, antero-posterior, oblique, and lateral fluoroscopic projection used confirm needle placement in all planes. Images permanently stored in EMR. Interpretation: I personally interpreted the imaging intraoperatively.  Adequate needle placement confirmed in multiple planes. Appropriate spread of contrast into desired area was observed. No evidence of afferent or efferent intravascular uptake. No intrathecal or subarachnoid spread observed. Permanent images saved into the patient's record.  Antibiotic Prophylaxis:  Indication(s): None identified Antibiotic given: None  Post-operative Assessment:  EBL: None Complications: No immediate post-treatment complications observed by team, or reported by patient. Note: The patient tolerated the entire procedure well. A repeat set of vitals were taken after the procedure and the patient was kept under observation following institutional policy, for this type of procedure. Post-procedural neurological assessment was performed, showing return to baseline, prior to discharge. The patient was provided with post-procedure discharge instructions, including a section on how to identify potential problems. Should any problems arise concerning this procedure, the patient was given instructions to immediately contact us, at any time, without hesitation. In any case, we plan to contact the patient by telephone for a follow-up status report regarding this interventional procedure. Comments:  No additional relevant information.  Plan of Care  Disposition: Discharge home  Discharge Date & Time: 04/30/2017; 0955 hrs.  Physician-requested Follow-up:  Return for post-procedure eval (in 2 wks), in addition, NS procedure, (2 wks), w/ MD.  Future Appointments Date Time Provider [place]  05/22/2017 9:00 AM Milinda Pointer, MD [facility] None  06/26/2017 10:30 AM Vevelyn Francois, NP [facility] None   Medications ordered for procedure: Meds ordered this encounter  Medications  . iohexol (OMNIPAQUE) 180 MG/ML injection 10 mL  . lidocaine (PF) (XYLOCAINE) 1 % injection 10 mL  . ropivacaine (PF) 5 mg/mL (0.5%) (NAROPIN) injection 1 mL  . lidocaine (PF) (XYLOCAINE) 1 % injection 5  mL  . triamcinolone acetonide (KENALOG-40) injection 40 mg  . sodium chloride flush (NS) 0.9 % injection 2 mL   Medications administered: We administered lidocaine (PF), triamcinolone acetonide, and sodium chloride flush.  See the medical record for exact dosing, route, and time of administration.  Lab-work, Procedure(s), & Referral(s) Ordered: Orders Placed This Encounter  Procedures  . Lumbar Epidural Injection  . Cervical Epidural Injection  . DG C-Arm 1-60 Min-No Report  . Informed Consent Details: Transcribe to consent form and obtain patient signature  . Provider attestation of informed consent for procedure/surgical case  . Verify informed consent  . Discharge instructions  .  Follow-up   Imaging Ordered: Results for orders placed in visit on 02/10/17  DG C-Arm 1-60 Min-No Report   Narrative Fluoroscopy was utilized by the requesting physician.  No radiographic  interpretation.    New Prescriptions   No medications on file   Primary Care Physician: [facility], [place] Location: [facility] Out[facility] Management Facility Note by: Gaspar Cola, MD Date: 04/30/2017; Time: 10:10 AM  Disclaimer:  Medicine is not an Chief Strategy Officer. The only guarantee in medicine is that nothing is guaranteed. It is important to note that the decision to proceed with this intervention was based on the information collected from the patient. The Data and conclusions were drawn from the patient's questionnaire, the interview, and the physical examination. Because the information was provided in large part by the patient, it cannot be guaranteed that it has not been purposely or unconsciously manipulated. Every effort has been made to obtain as much relevant data as possible for this evaluation. It is important to note that the conclusions that lead to this procedure are derived in large part from the available data. Always take into account that the treatment will also be dependent on availability of  resources and existing treatment guidelines, considered by other Pain Management Practitioners as being common knowledge and practice, at the time of the intervention. For Medico-Legal purposes, it is also important to point out that variation in procedural techniques and pharmacological choices are the acceptable norm. The indications, contraindications, technique, and results of the above procedure should only be interpreted and judged by a Board-Certified Interventional Pain Specialist with extensive familiarity and expertise in the same exact procedure and technique.  Instructions provided at this appointment: Patient Instructions  Please complete the Post Procedure Diary and bring it to your follow-up appointment.  Pain Management Discharge Instructions  General Discharge Instructions :  If you need to reach your doctor call: Monday-Friday 8:00 am - 4:00 pm at [phone] or toll free [phone].  After clinic hours [phone] to have operator reach doctor.  Bring all of your medication bottles to all your appointments in the pain clinic.  To cancel or reschedule your appointment with Pain Management please remember to call 24 hours in advance to avoid a fee.  Refer to the educational materials which you have been given on: General Risks, I had my Procedure. Discharge Instructions, Post Sedation.  Post Procedure Instructions:  The drugs you were given will stay in your system until tomorrow, so for the next 24 hours you should not drive, make any legal decisions or drink any alcoholic beverages.  You may eat anything you prefer, but it is better to start with liquids then soups and crackers, and gradually work up to solid foods.  Please notify your doctor immediately if you have any unusual bleeding, trouble breathing or pain that is not related to your normal pain.  Depending on the type of procedure that was done, some parts of your body may feel week and/or numb.  This  usually clears up by tonight or the next day.  Walk with the use of an assistive device or accompanied by an adult for the 24 hours.  You may use ice on the affected area for the first 24 hours.  Put ice in a Ziploc bag and cover with a towel and place against area 15 minutes on 15 minutes off.  You may switch to heat after 24 hours.Epidural Steroid Injection Patient Information  Description: The epidural space surrounds the nerves as they  exit the spinal cord.  In some patients, the nerves can be compressed and inflamed by a bulging disc or a tight spinal canal (spinal stenosis).  By injecting steroids into the epidural space, we can bring irritated nerves into direct contact with a potentially helpful medication.  These steroids act directly on the irritated nerves and can reduce swelling and inflammation which often leads to decreased pain.  Epidural steroids may be injected anywhere along the spine and from the neck to the low back depending upon the location of your pain.   After numbing the skin with local anesthetic (like Novocaine), a small needle is passed into the epidural space slowly.  You may experience a sensation of pressure while this is being done.  The entire block usually last less than 10 minutes.  Conditions which may be treated by epidural steroids:   Low back and leg pain  Neck and arm pain  Spinal stenosis  Post-laminectomy syndrome  Herpes zoster (shingles) pain  Pain from compression fractures  Preparation for the injection:  1. Do not eat any solid food or dairy products within 8 hours of your appointment.  2. You may drink clear liquids up to 3 hours before appointment.  Clear liquids include water, black coffee, juice or soda.  No milk or cream please. 3. You may take your regular medication, including pain medications, with a sip of water before your appointment  Diabetics should hold regular insulin (if taken separately) and take 1/2 normal NPH dos the  morning of the procedure.  Carry some sugar containing items with you to your appointment. 4. A driver must accompany you and be prepared to drive you home after your procedure.  5. Bring all your current medications with your. 6. An IV may be inserted and sedation may be given at the discretion of the physician.   7. A blood pressure Carter, EKG and other monitors will often be applied during the procedure.  Some patients may need to have extra oxygen administered for a short period. 8. You will be asked to provide medical information, including your allergies, prior to the procedure.  We must know immediately if you are taking blood thinners (like Coumadin/Warfarin)  Or if you are allergic to IV iodine contrast (dye). We must know if you could possible be pregnant.  Possible side-effects:  Bleeding from needle site  Infection (rare, may require surgery)  Nerve injury (rare)  Numbness & tingling (temporary)  Difficulty urinating (rare, temporary)  Spinal headache ( a headache worse with upright posture)  Light -headedness (temporary)  Pain at injection site (several days)  Decreased blood pressure (temporary)  Weakness in arm/leg (temporary)  Pressure sensation in back/neck (temporary)  Call if you experience:  Fever/chills associated with headache or increased back/neck pain.  Headache worsened by an upright position.  New onset weakness or numbness of an extremity below the injection site  Hives or difficulty breathing (go to the emergency room)  Inflammation or drainage at the infection site  Severe back/neck pain  Any new symptoms which are concerning to you  Please note:  Although the local anesthetic injected can often make your back or neck feel good for several hours after the injection, the pain will likely return.  It takes 3-7 days for steroids to work in the epidural space.  You may not notice any pain relief for at least that one week.  If effective, we  will often do a series of three injections spaced 3-6 weeks  apart to maximally decrease your pain.  After the initial series, we generally will wait several months before considering a repeat injection of the same type.  If you have any questions, please call [phone] [place] Steps to Quit Smoking Smoking tobacco can be harmful to your health and can affect almost every organ in your body. Smoking puts you, and those around you, at risk for developing many serious chronic diseases. Quitting smoking is difficult, but it is one of the best things that you can do for your health. It is never too late to quit. What are the benefits of quitting smoking? When you quit smoking, you lower your risk of developing serious diseases and conditions, such as:  Lung cancer or lung disease, such as COPD.  Heart disease.  Stroke.  Heart attack.  Infertility.  Osteoporosis and bone fractures.  Additionally, symptoms such as coughing, wheezing, and shortness of breath may get better when you quit. You may also find that you get sick less often because your body is stronger at fighting off colds and infections. If you are pregnant, quitting smoking can help to reduce your chances of having a baby of low birth weight. How do I get ready to quit? When you decide to quit smoking, create a plan to make sure that you are successful. Before you quit:  Pick a date to quit. Set a date within the next two weeks to give you time to prepare.  Write down the reasons why you are quitting. Keep this list in places where you will see it often, such as on your bathroom mirror or in your car or wallet.  Identify the people, places, things, and activities that make you want to smoke (triggers) and avoid them. Make sure to take these actions: ? Throw away all cigarettes at home, at work, and in your car. ? Throw away smoking accessories, such as Scientist, research (medical). ? Clean  your car and make sure to empty the ashtray. ? Clean your home, including curtains and carpets.  Tell your family, friends, and coworkers that you are quitting. Support from your loved ones can make quitting easier.  Talk with your health care provider about your options for quitting smoking.  Find out what treatment options are covered by your health insurance.  What strategies can I use to quit smoking? Talk with your healthcare provider about different strategies to quit smoking. Some strategies include:  Quitting smoking altogether instead of gradually lessening how much you smoke over a period of time. Research shows that quitting "cold [place]" is more successful than gradually quitting.  Attending in-person counseling to help you build problem-solving skills. You are more likely to have success in quitting if you attend several counseling sessions. Even short sessions of 10 minutes can be effective.  Finding resources and support systems that can help you to quit smoking and remain smoke-free after you quit. These resources are most helpful when you use them often. They can include: ? Online chats with a Social worker. ? Telephone quitlines. ? Careers information officer. ? Support groups or group counseling. ? Text messaging programs. ? Mobile phone applications.  Taking medicines to help you quit smoking. (If you are pregnant or breastfeeding, talk with your health care provider first.) Some medicines contain nicotine and some do not. Both types of medicines help with cravings, but the medicines that include nicotine help to relieve withdrawal symptoms. Your health care provider may recommend: ? Nicotine patches,  gum, or lozenges. ? Nicotine inhalers or sprays. ? Non-nicotine medicine that is taken by mouth.  Talk with your health care provider about combining strategies, such as taking medicines while you are also receiving in-person counseling. Using these two strategies together  makes you more likely to succeed in quitting than if you used either strategy on its own. If you are pregnant or breastfeeding, talk with your health care provider about finding counseling or other support strategies to quit smoking. Do not take medicine to help you quit smoking unless told to do so by your health care provider. What things can I do to make it easier to quit? Quitting smoking might feel overwhelming at first, but there is a lot that you can do to make it easier. Take these important actions:  Reach out to your family and friends and ask that they support and encourage you during this time. Call telephone quitlines, reach out to support groups, or work with a counselor for support.  Ask people who smoke to avoid smoking around you.  Avoid places that trigger you to smoke, such as bars, parties, or smoke-break areas at work.  Spend time around people who do not smoke.  [place] stress in your life, because stress can be a smoking trigger for some people. To lessen stress, try: ? Exercising regularly. ? Deep-breathing exercises. ? Yoga. ? Meditating. ? Performing a body scan. This involves closing your eyes, scanning your body from head to toe, and noticing which parts of your body are particularly tense. Purposefully relax the muscles in those areas.  Download or purchase mobile phone or tablet apps (applications) that can help you stick to your quit plan by providing reminders, tips, and encouragement. There are many free apps, such as QuitGuide from the [employer] Office manager for Disease Control and Prevention). You can find other support for quitting smoking (smoking cessation) through smokefree.gov and other websites.  How will I feel when I quit smoking? Within the first 24 hours of quitting smoking, you may start to feel some withdrawal symptoms. These symptoms are usually most noticeable 2-3 days after quitting, but they usually do not last beyond 2-3 weeks. Changes or symptoms that  you might experience include:  Mood swings.  Restlessness, anxiety, or irritation.  Difficulty concentrating.  Dizziness.  Strong cravings for sugary foods in addition to nicotine.  Mild weight gain.  Constipation.  Nausea.  Coughing or a sore throat.  Changes in how your medicines work in your body.  A depressed mood.  Difficulty sleeping (insomnia).  After the first 2-3 weeks of quitting, you may start to notice more positive results, such as:  Improved sense of smell and taste.  Decreased coughing and sore throat.  Slower heart rate.  Lower blood pressure.  Clearer skin.  The ability to breathe more easily.  Fewer sick days.  Quitting smoking is very challenging for most people. Do not get discouraged if you are not successful the first time. Some people need to make many attempts to quit before they achieve long-term success. Do your best to stick to your quit plan, and talk with your health care provider if you have any questions or concerns. This information is not intended to replace advice given to you by your health care provider. Make sure you discuss any questions you have with your health care provider. Document Released: 09/24/2001 Document Revised: 05/28/2016 Document Reviewed: 02/14/2015 Elsevier Interactive Patient Education  2017 [employer].  Steps to Quit Smoking Smoking tobacco can  be bad for your health. It can also affect almost every organ in your body. Smoking puts you and people around you at risk for many serious long-lasting (chronic) diseases. Quitting smoking is hard, but it is one of the best things that you can do for your health. It is never too late to quit. What are the benefits of quitting smoking? When you quit smoking, you lower your risk for getting serious diseases and conditions. They can include:  Lung cancer or lung disease.  Heart disease.  Stroke.  Heart attack.  Not being able to have children  (infertility).  Weak bones (osteoporosis) and broken bones (fractures).  If you have coughing, wheezing, and shortness of breath, those symptoms may get better when you quit. You may also get sick less often. If you are pregnant, quitting smoking can help to lower your chances of having a baby of low birth weight. What can I do to help me quit smoking? Talk with your doctor about what can help you quit smoking. Some things you can do (strategies) include:  Quitting smoking totally, instead of slowly cutting back how much you smoke over a period of time.  Going to in-person counseling. You are more likely to quit if you go to many counseling sessions.  Using resources and support systems, such as: ? Database administrator with a Social worker. ? Phone quitlines. ? Careers information officer. ? Support groups or group counseling. ? Text messaging programs. ? Mobile phone apps or applications.  Taking medicines. Some of these medicines may have nicotine in them. If you are pregnant or breastfeeding, do not take any medicines to quit smoking unless your doctor says it is okay. Talk with your doctor about counseling or other things that can help you.  Talk with your doctor about using more than one strategy at the same time, such as taking medicines while you are also going to in-person counseling. This can help make quitting easier. What things can I do to make it easier to quit? Quitting smoking might feel very hard at first, but there is a lot that you can do to make it easier. Take these steps:  Talk to your family and friends. Ask them to support and encourage you.  Call phone quitlines, reach out to support groups, or work with a Social worker.  Ask people who smoke to not smoke around you.  Avoid places that make you want (trigger) to smoke, such as: ? Bars. ? Parties. ? Smoke-break areas at work.  Spend time with people who do not smoke.  Lower the stress in your life. Stress can make you  want to smoke. Try these things to help your stress: ? Getting regular exercise. ? Deep-breathing exercises. ? Yoga. ? Meditating. ? Doing a body scan. To do this, close your eyes, focus on one area of your body at a time from head to toe, and notice which parts of your body are tense. Try to relax the muscles in those areas.  Download or buy apps on your mobile phone or tablet that can help you stick to your quit plan. There are many free apps, such as QuitGuide from the [employer] Office manager for Disease Control and Prevention). You can find more support from smokefree.gov and other websites.  This information is not intended to replace advice given to you by your health care provider. Make sure you discuss any questions you have with your health care provider. Document Released: 07/27/2009 Document Revised: 05/28/2016 Document Reviewed: 02/14/2015 Elsevier  Interactive Patient Education  [employer]. ____________________________________________________________________________________________  Preparing for your procedure (without sedation) Instructions: . Oral Intake: Do not eat or drink anything for at least 3 hours prior to your procedure. . Transportation: Unless otherwise stated by your physician, you may drive yourself after the procedure. . Blood Pressure Medicine: Take your blood pressure medicine with a sip of water the morning of the procedure. . Blood thinners:  . Diabetics on insulin: Notify the staff so that you can be scheduled 1st case in the morning. If your diabetes requires high dose insulin, take only  of your normal insulin dose the morning of the procedure and notify the staff that you have done so. . Preventing infections: Shower with an antibacterial soap the morning of your procedure.  . Build-up your immune system: Take 1000 mg of Vitamin C with every meal (3 times a day) the day prior to your procedure. [facility] Antibiotics: Inform the staff if you have a condition or reason  that requires you to take antibiotics before dental procedures. . Pregnancy: If you are pregnant, call and cancel the procedure. . Sickness: If you have a cold, fever, or any active infections, call and cancel the procedure. . Arrival: You must be in the facility at least 30 minutes prior to your scheduled procedure. . Children: Do not bring any children with you. . Dress appropriately: Bring dark clothing that you would not mind if they get stained. . Valuables: Do not bring any jewelry or valuables. Procedure appointments are reserved for interventional treatments only. [facility] No Prescription Refills. . No medication changes will be discussed during procedure appointments. . No disability issues will be discussed. ____________________________________________________________________________________________

## 2017-04-30 NOTE — Patient Instructions (Addendum)
Please complete the Post Procedure Diary and bring it to your follow-up appointment.  Pain Management Discharge Instructions  General Discharge Instructions :  If you need to reach your doctor call: Monday-Friday 8:00 am - 4:00 pm at [phone] or toll free [phone].  After clinic hours [phone] to have operator reach doctor.  Bring all of your medication bottles to all your appointments in the pain clinic.  To cancel or reschedule your appointment with Pain Management please remember to call 24 hours in advance to avoid a fee.  Refer to the educational materials which you have been given on: General Risks, I had my Procedure. Discharge Instructions, Post Sedation.  Post Procedure Instructions:  The drugs you were given will stay in your system until tomorrow, so for the next 24 hours you should not drive, make any legal decisions or drink any alcoholic beverages.  You may eat anything you prefer, but it is better to start with liquids then soups and crackers, and gradually work up to solid foods.  Please notify your doctor immediately if you have any unusual bleeding, trouble breathing or pain that is not related to your normal pain.  Depending on the type of procedure that was done, some parts of your body may feel week and/or numb.  This usually clears up by tonight or the next day.  Walk with the use of an assistive device or accompanied by an adult for the 24 hours.  You may use ice on the affected area for the first 24 hours.  Put ice in a Ziploc bag and cover with a towel and place against area 15 minutes on 15 minutes off.  You may switch to heat after 24 hours.Epidural Steroid Injection Patient Information  Description: The epidural space surrounds the nerves as they exit the spinal cord.  In some patients, the nerves can be compressed and inflamed by a bulging disc or a tight spinal canal (spinal stenosis).  By injecting steroids into the epidural space, we can  bring irritated nerves into direct contact with a potentially helpful medication.  These steroids act directly on the irritated nerves and can reduce swelling and inflammation which often leads to decreased pain.  Epidural steroids may be injected anywhere along the spine and from the neck to the low back depending upon the location of your pain.   After numbing the skin with local anesthetic (like Novocaine), a small needle is passed into the epidural space slowly.  You may experience a sensation of pressure while this is being done.  The entire block usually last less than 10 minutes.  Conditions which may be treated by epidural steroids:   Low back and leg pain  Neck and arm pain  Spinal stenosis  Post-laminectomy syndrome  Herpes zoster (shingles) pain  Pain from compression fractures  Preparation for the injection:  1. Do not eat any solid food or dairy products within 8 hours of your appointment.  2. You may drink clear liquids up to 3 hours before appointment.  Clear liquids include water, black coffee, juice or soda.  No milk or cream please. 3. You may take your regular medication, including pain medications, with a sip of water before your appointment  Diabetics should hold regular insulin (if taken separately) and take 1/2 normal NPH dos the morning of the procedure.  Carry some sugar containing items with you to your appointment. 4. A driver must accompany you and be prepared to drive you home after your procedure.  5.  Bring all your current medications with your. 6. An IV may be inserted and sedation may be given at the discretion of the physician.   7. A blood pressure cuff, EKG and other monitors will often be applied during the procedure.  Some patients may need to have extra oxygen administered for a short period. 8. You will be asked to provide medical information, including your allergies, prior to the procedure.  We must know immediately if you are taking blood thinners  (like Coumadin/Warfarin)  Or if you are allergic to IV iodine contrast (dye). We must know if you could possible be pregnant.  Possible side-effects:  Bleeding from needle site  Infection (rare, may require surgery)  Nerve injury (rare)  Numbness & tingling (temporary)  Difficulty urinating (rare, temporary)  Spinal headache ( a headache worse with upright posture)  Light -headedness (temporary)  Pain at injection site (several days)  Decreased blood pressure (temporary)  Weakness in arm/leg (temporary)  Pressure sensation in back/neck (temporary)  Call if you experience:  Fever/chills associated with headache or increased back/neck pain.  Headache worsened by an upright position.  New onset weakness or numbness of an extremity below the injection site  Hives or difficulty breathing (go to the emergency room)  Inflammation or drainage at the infection site  Severe back/neck pain  Any new symptoms which are concerning to you  Please note:  Although the local anesthetic injected can often make your back or neck feel good for several hours after the injection, the pain will likely return.  It takes 3-7 days for steroids to work in the epidural space.  You may not notice any pain relief for at least that one week.  If effective, we will often do a series of three injections spaced 3-6 weeks apart to maximally decrease your pain.  After the initial series, we generally will wait several months before considering a repeat injection of the same type.  If you have any questions, please call [phone] Uncertain Steps to Quit Smoking Smoking tobacco can be harmful to your health and can affect almost every organ in your body. Smoking puts you, and those around you, at risk for developing many serious chronic diseases. Quitting smoking is difficult, but it is one of the best things that you can do for your health. It is never too late  to quit. What are the benefits of quitting smoking? When you quit smoking, you lower your risk of developing serious diseases and conditions, such as:  Lung cancer or lung disease, such as COPD.  Heart disease.  Stroke.  Heart attack.  Infertility.  Osteoporosis and bone fractures.  Additionally, symptoms such as coughing, wheezing, and shortness of breath may get better when you quit. You may also find that you get sick less often because your body is stronger at fighting off colds and infections. If you are pregnant, quitting smoking can help to reduce your chances of having a baby of low birth weight. How do I get ready to quit? When you decide to quit smoking, create a plan to make sure that you are successful. Before you quit:  Pick a date to quit. Set a date within the next two weeks to give you time to prepare.  Write down the reasons why you are quitting. Keep this list in places where you will see it often, such as on your bathroom mirror or in your car or wallet.  Identify the people, places,  things, and activities that make you want to smoke (triggers) and avoid them. Make sure to take these actions: ? Throw away all cigarettes at home, at work, and in your car. ? Throw away smoking accessories, such as Scientist, research (medical). ? Clean your car and make sure to empty the ashtray. ? Clean your home, including curtains and carpets.  Tell your family, friends, and coworkers that you are quitting. Support from your loved ones can make quitting easier.  Talk with your health care provider about your options for quitting smoking.  Find out what treatment options are covered by your health insurance.  What strategies can I use to quit smoking? Talk with your healthcare provider about different strategies to quit smoking. Some strategies include:  Quitting smoking altogether instead of gradually lessening how much you smoke over a period of time. Research shows that quitting  "cold [place]" is more successful than gradually quitting.  Attending in-person counseling to help you build problem-solving skills. You are more likely to have success in quitting if you attend several counseling sessions. Even short sessions of 10 minutes can be effective.  Finding resources and support systems that can help you to quit smoking and remain smoke-free after you quit. These resources are most helpful when you use them often. They can include: ? Online chats with a Social worker. ? Telephone quitlines. ? Careers information officer. ? Support groups or group counseling. ? Text messaging programs. ? Mobile phone applications.  Taking medicines to help you quit smoking. (If you are pregnant or breastfeeding, talk with your health care provider first.) Some medicines contain nicotine and some do not. Both types of medicines help with cravings, but the medicines that include nicotine help to relieve withdrawal symptoms. Your health care provider may recommend: ? Nicotine patches, gum, or lozenges. ? Nicotine inhalers or sprays. ? Non-nicotine medicine that is taken by mouth.  Talk with your health care provider about combining strategies, such as taking medicines while you are also receiving in-person counseling. Using these two strategies together makes you more likely to succeed in quitting than if you used either strategy on its own. If you are pregnant or breastfeeding, talk with your health care provider about finding counseling or other support strategies to quit smoking. Do not take medicine to help you quit smoking unless told to do so by your health care provider. What things can I do to make it easier to quit? Quitting smoking might feel overwhelming at first, but there is a lot that you can do to make it easier. Take these important actions:  Reach out to your family and friends and ask that they support and encourage you during this time. Call telephone quitlines, reach out to  support groups, or work with a counselor for support.  Ask people who smoke to avoid smoking around you.  Avoid places that trigger you to smoke, such as bars, parties, or smoke-break areas at work.  Spend time around people who do not smoke.  Lessen stress in your life, because stress can be a smoking trigger for some people. To lessen stress, try: ? Exercising regularly. ? Deep-breathing exercises. ? Yoga. ? Meditating. ? Performing a body scan. This involves closing your eyes, scanning your body from head to toe, and noticing which parts of your body are particularly tense. Purposefully relax the muscles in those areas.  Download or purchase mobile phone or tablet apps (applications) that can help you stick to your quit plan by providing reminders,  tips, and encouragement. There are many free apps, such as QuitGuide from the [employer] Office manager for Disease Control and Prevention). You can find other support for quitting smoking (smoking cessation) through smokefree.gov and other websites.  How will I feel when I quit smoking? Within the first 24 hours of quitting smoking, you may start to feel some withdrawal symptoms. These symptoms are usually most noticeable 2-3 days after quitting, but they usually do not last beyond 2-3 weeks. Changes or symptoms that you might experience include:  Mood swings.  Restlessness, anxiety, or irritation.  Difficulty concentrating.  Dizziness.  Strong cravings for sugary foods in addition to nicotine.  Mild weight gain.  Constipation.  Nausea.  Coughing or a sore throat.  Changes in how your medicines work in your body.  A depressed mood.  Difficulty sleeping (insomnia).  After the first 2-3 weeks of quitting, you may start to notice more positive results, such as:  Improved sense of smell and taste.  Decreased coughing and sore throat.  Slower heart rate.  Lower blood pressure.  Clearer skin.  The ability to breathe more  easily.  Fewer sick days.  Quitting smoking is very challenging for most people. Do not get discouraged if you are not successful the first time. Some people need to make many attempts to quit before they achieve long-term success. Do your best to stick to your quit plan, and talk with your health care provider if you have any questions or concerns. This information is not intended to replace advice given to you by your health care provider. Make sure you discuss any questions you have with your health care provider. Document Released: 09/24/2001 Document Revised: 05/28/2016 Document Reviewed: 02/14/2015 Elsevier Interactive Patient Education  2017 [employer].  Steps to Quit Smoking Smoking tobacco can be bad for your health. It can also affect almost every organ in your body. Smoking puts you and people around you at risk for many serious long-lasting (chronic) diseases. Quitting smoking is hard, but it is one of the best things that you can do for your health. It is never too late to quit. What are the benefits of quitting smoking? When you quit smoking, you lower your risk for getting serious diseases and conditions. They can include:  Lung cancer or lung disease.  Heart disease.  Stroke.  Heart attack.  Not being able to have children (infertility).  Weak bones (osteoporosis) and broken bones (fractures).  If you have coughing, wheezing, and shortness of breath, those symptoms may get better when you quit. You may also get sick less often. If you are pregnant, quitting smoking can help to lower your chances of having a baby of low birth weight. What can I do to help me quit smoking? Talk with your doctor about what can help you quit smoking. Some things you can do (strategies) include:  Quitting smoking totally, instead of slowly cutting back how much you smoke over a period of time.  Going to in-person counseling. You are more likely to quit if you go to many counseling  sessions.  Using resources and support systems, such as: ? Database administrator with a Social worker. ? Phone quitlines. ? Careers information officer. ? Support groups or group counseling. ? Text messaging programs. ? Mobile phone apps or applications.  Taking medicines. Some of these medicines may have nicotine in them. If you are pregnant or breastfeeding, do not take any medicines to quit smoking unless your doctor says it is okay. Talk with your  doctor about counseling or other things that can help you.  Talk with your doctor about using more than one strategy at the same time, such as taking medicines while you are also going to in-person counseling. This can help make quitting easier. What things can I do to make it easier to quit? Quitting smoking might feel very hard at first, but there is a lot that you can do to make it easier. Take these steps:  Talk to your family and friends. Ask them to support and encourage you.  Call phone quitlines, reach out to support groups, or work with a Social worker.  Ask people who smoke to not smoke around you.  Avoid places that make you want (trigger) to smoke, such as: ? Bars. ? Parties. ? Smoke-break areas at work.  Spend time with people who do not smoke.  Lower the stress in your life. Stress can make you want to smoke. Try these things to help your stress: ? Getting regular exercise. ? Deep-breathing exercises. ? Yoga. ? Meditating. ? Doing a body scan. To do this, close your eyes, focus on one area of your body at a time from head to toe, and notice which parts of your body are tense. Try to relax the muscles in those areas.  Download or buy apps on your mobile phone or tablet that can help you stick to your quit plan. There are many free apps, such as QuitGuide from the [employer] Office manager for Disease Control and Prevention). You can find more support from smokefree.gov and other websites.  This information is not intended to replace advice given to  you by your health care provider. Make sure you discuss any questions you have with your health care provider. Document Released: 07/27/2009 Document Revised: 05/28/2016 Document Reviewed: 02/14/2015 Elsevier Interactive Patient Education  2018 [employer]. ____________________________________________________________________________________________  Preparing for your procedure (without sedation) Instructions: . Oral Intake: Do not eat or drink anything for at least 3 hours prior to your procedure. . Transportation: Unless otherwise stated by your physician, you may drive yourself after the procedure. . Blood Pressure Medicine: Take your blood pressure medicine with a sip of water the morning of the procedure. . Blood thinners:  . Diabetics on insulin: Notify the staff so that you can be scheduled 1st case in the morning. If your diabetes requires high dose insulin, take only  of your normal insulin dose the morning of the procedure and notify the staff that you have done so. . Preventing infections: Shower with an antibacterial soap the morning of your procedure.  . Build-up your immune system: Take 1000 mg of Vitamin C with every meal (3 times a day) the day prior to your procedure. [facility] Kitchen Antibiotics: Inform the staff if you have a condition or reason that requires you to take antibiotics before dental procedures. . Pregnancy: If you are pregnant, call and cancel the procedure. . Sickness: If you have a cold, fever, or any active infections, call and cancel the procedure. . Arrival: You must be in the facility at least 30 minutes prior to your scheduled procedure. . Children: Do not bring any children with you. . Dress appropriately: Bring dark clothing that you would not mind if they get stained. . Valuables: Do not bring any jewelry or valuables. Procedure appointments are reserved for interventional treatments only. Marland Kitchen No Prescription Refills. . No medication changes will be discussed  during procedure appointments. . No disability issues will be discussed. ____________________________________________________________________________________________

## 2017-04-30 NOTE — Progress Notes (Signed)
Nursing Pain Medication Assessment:  Safety precautions to be maintained throughout the outpatient stay will include: orient to surroundings, keep bed in low position, maintain call bell within reach at all times, provide assistance with transfer out of bed and ambulation.  Medication Inspection Compliance: Pill count conducted under aseptic conditions, in front of the patient. Neither the pills nor the bottle was removed from the patient's sight at any time. Once count was completed pills were immediately returned to the patient in their original bottle.  Medication: Oxycodone IR Pill/Patch Count: 28 of 120 pills remain Pill/Patch Appearance: Markings consistent with prescribed medication Bottle Appearance: Standard pharmacy container. Clearly labeled. Filled Date: 06 / 25 / 2018 Last Medication intake:  Today

## 2017-05-01 ENCOUNTER — Telehealth: Payer: Self-pay | Admitting: *Deleted

## 2017-05-01 NOTE — Telephone Encounter (Signed)
Verbalizes no questions or concerns from procedure on yesterday.

## 2017-05-22 ENCOUNTER — Ambulatory Visit: Payer: Medicare Other | Admitting: Pain Medicine

## 2017-05-22 NOTE — Progress Notes (Deleted)
Patient's Name: Rick MAZZIE Sr.  MRN: [id number]  Referring Provider: [facility], [place]  DOB: 1951/07/31  PCP: [facility], [place]  DOS: 05/22/2017  Note by: Gaspar Cola, MD  Service setting: Ambulatory outpatient  Specialty: Interventional Pain Management  Patient type: Established  Location: [facility] (AMB) Pain Management Facility  Visit type: Interventional Procedure   Primary Reason for Visit: Interventional Pain Management Treatment. CC: No chief complaint on file.  Procedure:  Anesthesia, Analgesia, Anxiolysis:  Type: Diagnostic, Inter-Laminar, Epidural Steroid Injection Region: Posterior Cervico-thoracic Region Level: C7-T1 Laterality: Left-Sided Paramedial  Type: Local Anesthesia Local Anesthetic: Lidocaine 1% Route: Infiltration (/IM) IV Access: Declined Sedation: Declined  Indication(s): Analgesia          Indications: 1. Chronic cervical radicular pain (Left)   2. Cervical spondylosis   3. Failed cervical surgery syndrome   4. Degeneration of intervertebral disc of cervical region    Pain Score: Pre-procedure:  /10 Post-procedure:  /10  Pre-op Assessment:  Mr. Spiering is a 66 y.o. (year old), male patient, seen today for interventional treatment. He  has a past surgical history that includes Tonsillectomy; Lung removal, partial; and Neck surgery. Mr. Bail has a current medication list which includes the following prescription(s): aclidinium bromide, advair diskus, albuterol, aspirin ec, calcium carb-cholecalciferol, cholecalciferol, diphenhydramine, docusate sodium, fluticasone-salmeterol, hydrochlorothiazide, hydroxyzine, ibuprofen, ipratropium-albuterol, loratadine, metformin, methocarbamol, multiple vitamins-minerals, oxycodone-acetaminophen, oxycodone-acetaminophen, oxycodone-acetaminophen, pantoprazole, pregabalin, sildenafil, sodium chloride hypertonic, trazodone, tudorza pressair, and umeclidinium bromide. His primarily concern today is the No chief complaint  on file.  Initial Vital Signs: There were no vitals taken for this visit. BMI: Estimated body mass index is 22.45 kg/m as calculated from the following:   Height as of 04/30/17: 5' 9" (1.753 m).   Weight as of 04/30/17: 152 lb (68.9 kg).  Risk Assessment: Allergies: Reviewed. He is allergic to atorvastatin and varenicline.  Allergy Precautions: None required Coagulopathies: Reviewed. None identified.  Blood-thinner therapy: None at this time Active Infection(s): Reviewed. None identified. Mr. Budhu is afebrile  Site Confirmation: Mr. Andonian was asked to confirm the procedure and laterality before marking the site Procedure checklist: Completed Consent: Before the procedure and under the influence of no sedative(s), amnesic(s), or anxiolytics, the patient was informed of the treatment options, risks and possible complications. To fulfill our ethical and legal obligations, as recommended by the American Medical Association's Code of Ethics, I have informed the patient of my clinical impression; the nature and purpose of the treatment or procedure; the risks, benefits, and possible complications of the intervention; the alternatives, including doing nothing; the risk(s) and benefit(s) of the alternative treatment(s) or procedure(s); and the risk(s) and benefit(s) of doing nothing. The patient was provided information about the general risks and possible complications associated with the procedure. These may include, but are not limited to: failure to achieve desired goals, infection, bleeding, organ or nerve damage, allergic reactions, paralysis, and death. In addition, the patient was informed of those risks and complications associated to Spine-related procedures, such as failure to decrease pain; infection (i.e.: Meningitis, epidural or intraspinal abscess); bleeding (i.e.: epidural hematoma, subarachnoid hemorrhage, or any other type of intraspinal or peri-dural bleeding); organ or nerve damage  (i.e.: Any type of peripheral nerve, nerve root, or spinal cord injury) with subsequent damage to sensory, motor, and/or autonomic systems, resulting in permanent pain, numbness, and/or weakness of one or several areas of the body; allergic reactions; (i.e.: anaphylactic reaction); and/or death. Furthermore, the patient was informed of those risks and complications associated with  the medications. These include, but are not limited to: allergic reactions (i.e.: anaphylactic or anaphylactoid reaction(s)); adrenal axis suppression; blood sugar elevation that in diabetics may result in ketoacidosis or comma; water retention that in patients with history of congestive heart failure may result in shortness of breath, pulmonary edema, and decompensation with resultant heart failure; weight gain; swelling or edema; medication-induced neural toxicity; particulate matter embolism and blood vessel occlusion with resultant organ, and/or nervous system infarction; and/or aseptic necrosis of one or more joints. Finally, the patient was informed that Medicine is not an exact science; therefore, there is also the possibility of unforeseen or unpredictable risks and/or possible complications that may result in a catastrophic outcome. The patient indicated having understood very clearly. We have given the patient no guarantees and we have made no promises. Enough time was given to the patient to ask questions, all of which were answered to the patient's satisfaction. Mr. Galindo has indicated that he wanted to continue with the procedure. Attestation: I, the ordering provider, attest that I have discussed with the patient the benefits, risks, side-effects, alternatives, likelihood of achieving goals, and potential problems during recovery for the procedure that I have provided informed consent. Date: 05/22/2017; Time: 7:59 AM  Pre-Procedure Preparation:  Monitoring: As per clinic protocol. Respiration, ETCO2, SpO2, BP, heart  rate and rhythm monitor placed and checked for adequate function Safety Precautions: Patient was assessed for positional comfort and pressure points before starting the procedure. Time-out: I initiated and conducted the "Time-out" before starting the procedure, as per protocol. The patient was asked to participate by confirming the accuracy of the "Time Out" information. Verification of the correct person, site, and procedure were performed and confirmed by me, the nursing staff, and the patient. "Time-out" conducted as per Joint Commission's Universal Protocol (UP.01.01.01). "Time-out" Date & Time: 05/22/2017;   hrs.  Description of Procedure Process:   Position: Prone with head of the table was raised to facilitate breathing. Target Area: For Epidural Steroid injections the target is the interlaminar space, initially targeting the lower border of the superior vertebral body lamina. Approach: Paramedial approach. Area Prepped: Entire PosteriorCervical Region Prepping solution: ChloraPrep (2% chlorhexidine gluconate and 70% isopropyl alcohol) Safety Precautions: Aspiration looking for blood return was conducted prior to all injections. At no point did we inject any substances, as a needle was being advanced. No attempts were made at seeking any paresthesias. Safe injection practices and needle disposal techniques used. Medications properly checked for expiration dates. SDV (single dose vial) medications used. Description of the Procedure: Protocol guidelines were followed. The procedure needle was introduced through the skin, ipsilateral to the reported pain, and advanced to the target area. Bone was contacted and the needle walked caudad, until the lamina was cleared. The epidural space was identified using "loss-of-resistance technique" with 2-3 ml of PF-NaCl (0.9% NSS), in a 5cc LOR glass syringe. There were no vitals filed for this visit.  Start Time:   hrs. End Time:   hrs. Materials:  Needle(s)  Type: Epidural needle Gauge: 17G Length: 3.5-in Medication(s): Mr. Banner had no medications administered during this visit. Please see chart orders for dosing details.  Imaging Guidance (Spinal):  Type of Imaging Technique: Fluoroscopy Guidance (Spinal) Indication(s): Assistance in needle guidance and placement for procedures requiring needle placement in or near specific anatomical locations not easily accessible without such assistance. Exposure Time: Please see nurses notes. Contrast: Before injecting any contrast, we confirmed that the patient did not have an allergy to iodine, shellfish,  or radiological contrast. Once satisfactory needle placement was completed at the desired level, radiological contrast was injected. Contrast injected under live fluoroscopy. No contrast complications. See chart for type and volume of contrast used. Fluoroscopic Guidance: I was personally present during the use of fluoroscopy. "Tunnel Vision Technique" used to obtain the best possible view of the target area. Parallax error corrected before commencing the procedure. "Direction-depth-direction" technique used to introduce the needle under continuous pulsed fluoroscopy. Once target was reached, antero-posterior, oblique, and lateral fluoroscopic projection used confirm needle placement in all planes. Images permanently stored in EMR. Interpretation: I personally interpreted the imaging intraoperatively. Adequate needle placement confirmed in multiple planes. Appropriate spread of contrast into desired area was observed. No evidence of afferent or efferent intravascular uptake. No intrathecal or subarachnoid spread observed. Permanent images saved into the patient's record.  Antibiotic Prophylaxis:  Indication(s): None identified Antibiotic given: None  Post-operative Assessment:  EBL: None Complications: No immediate post-treatment complications observed by team, or reported by patient. Note: The patient  tolerated the entire procedure well. A repeat set of vitals were taken after the procedure and the patient was kept under observation following institutional policy, for this type of procedure. Post-procedural neurological assessment was performed, showing return to baseline, prior to discharge. The patient was provided with post-procedure discharge instructions, including a section on how to identify potential problems. Should any problems arise concerning this procedure, the patient was given instructions to immediately contact us, at any time, without hesitation. In any case, we plan to contact the patient by telephone for a follow-up status report regarding this interventional procedure. Comments:  No additional relevant information.  Plan of Care  Disposition: Discharge home  Discharge Date & Time: 05/22/2017;   hrs.  Physician-requested Follow-up:  No Follow-up on file.  New Prescriptions   No medications on file   Future Appointments Date Time Provider [place]  05/22/2017 9:00 AM Milinda Pointer, MD [facility] None  06/26/2017 10:30 AM Vevelyn Francois, NP [facility] None   Imaging Orders  No imaging studies ordered today   Procedure Orders    No procedure(s) ordered today   Medications ordered for procedure: No orders of the defined types were placed in this encounter.  Medications administered: Mr. Henard had no medications administered during this visit.  See the medical record for exact dosing, route, and time of administration.  Primary Care Physician: [facility], [place] Location: [facility] Out[facility] Management Facility Note by: Gaspar Cola, MD Date: 05/22/2017; Time: 7:59 AM  Disclaimer:  Medicine is not an Chief Strategy Officer. The only guarantee in medicine is that nothing is guaranteed. It is important to note that the decision to proceed with this intervention was based on the information collected from the patient. The Data and conclusions were drawn from  the patient's questionnaire, the interview, and the physical examination. Because the information was provided in large part by the patient, it cannot be guaranteed that it has not been purposely or unconsciously manipulated. Every effort has been made to obtain as much relevant data as possible for this evaluation. It is important to note that the conclusions that lead to this procedure are derived in large part from the available data. Always take into account that the treatment will also be dependent on availability of resources and existing treatment guidelines, considered by other Pain Management Practitioners as being common knowledge and practice, at the time of the intervention. For Medico-Legal purposes, it is also important to point out that variation in  procedural techniques and pharmacological choices are the acceptable norm. The indications, contraindications, technique, and results of the above procedure should only be interpreted and judged by a Board-Certified Interventional Pain Specialist with extensive familiarity and expertise in the same exact procedure and technique.

## 2017-05-28 IMAGING — CR DG THORACIC SPINE 2V
3 series · 4 of 4 positions shown · non-contrast
Comparison: None.

CLINICAL DATA: Chronic back pain.

EXAM:
THORACIC SPINE 2 VIEWS

[t-spine ap]
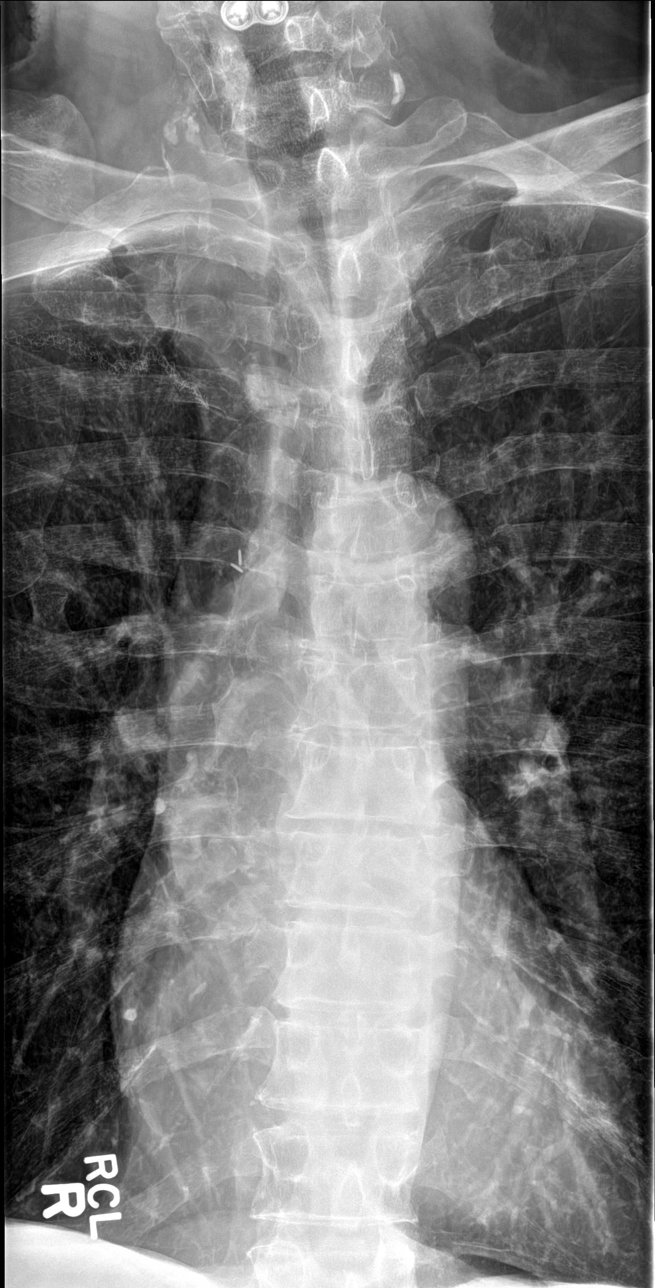

[Series 2: t-spine lat · 0.14mm/px · 2 of 2 slices shown]
[im 1/2]
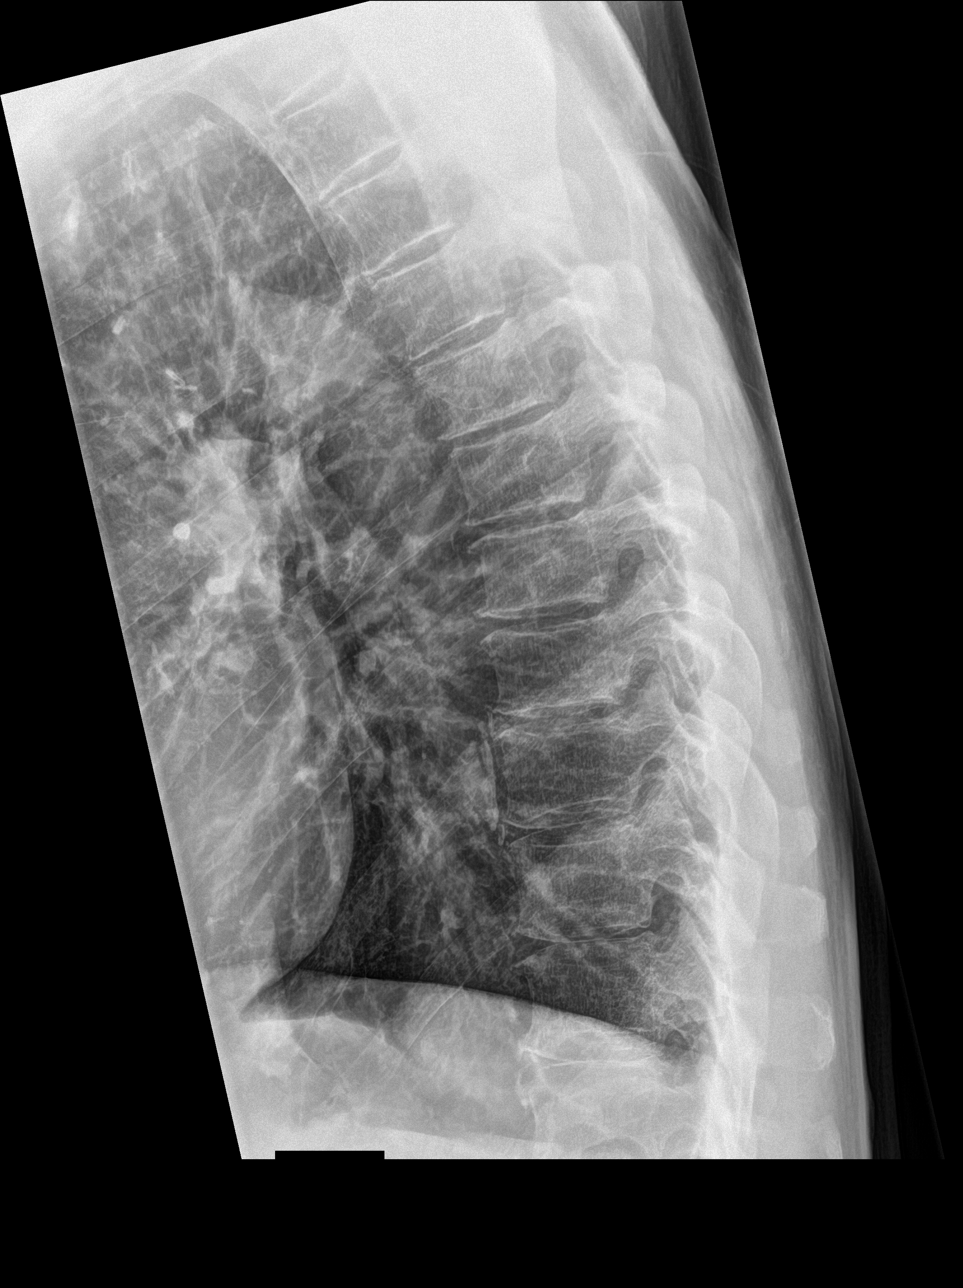
[im 2/2]
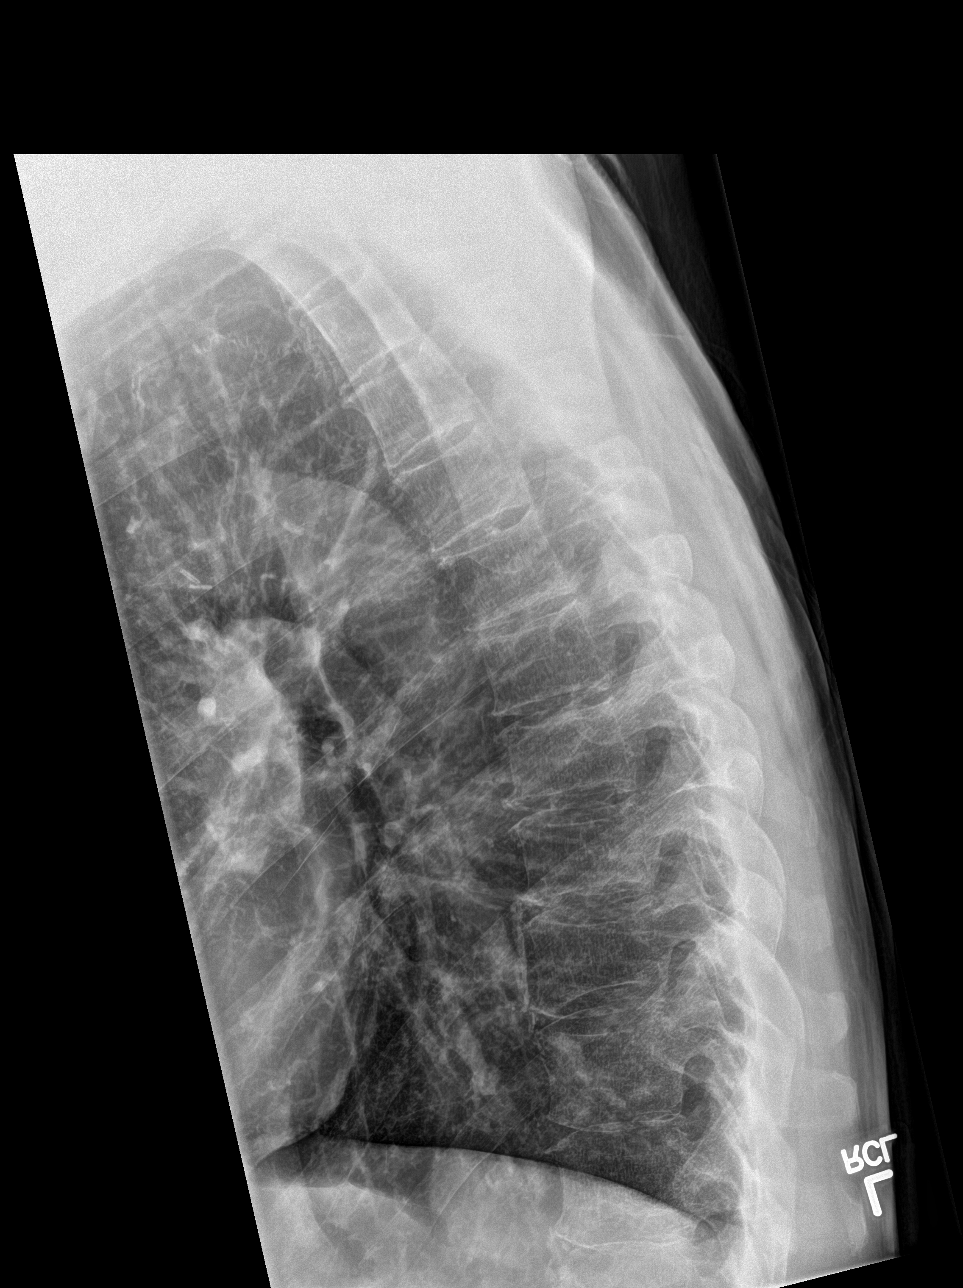

[t-spine swimmers]
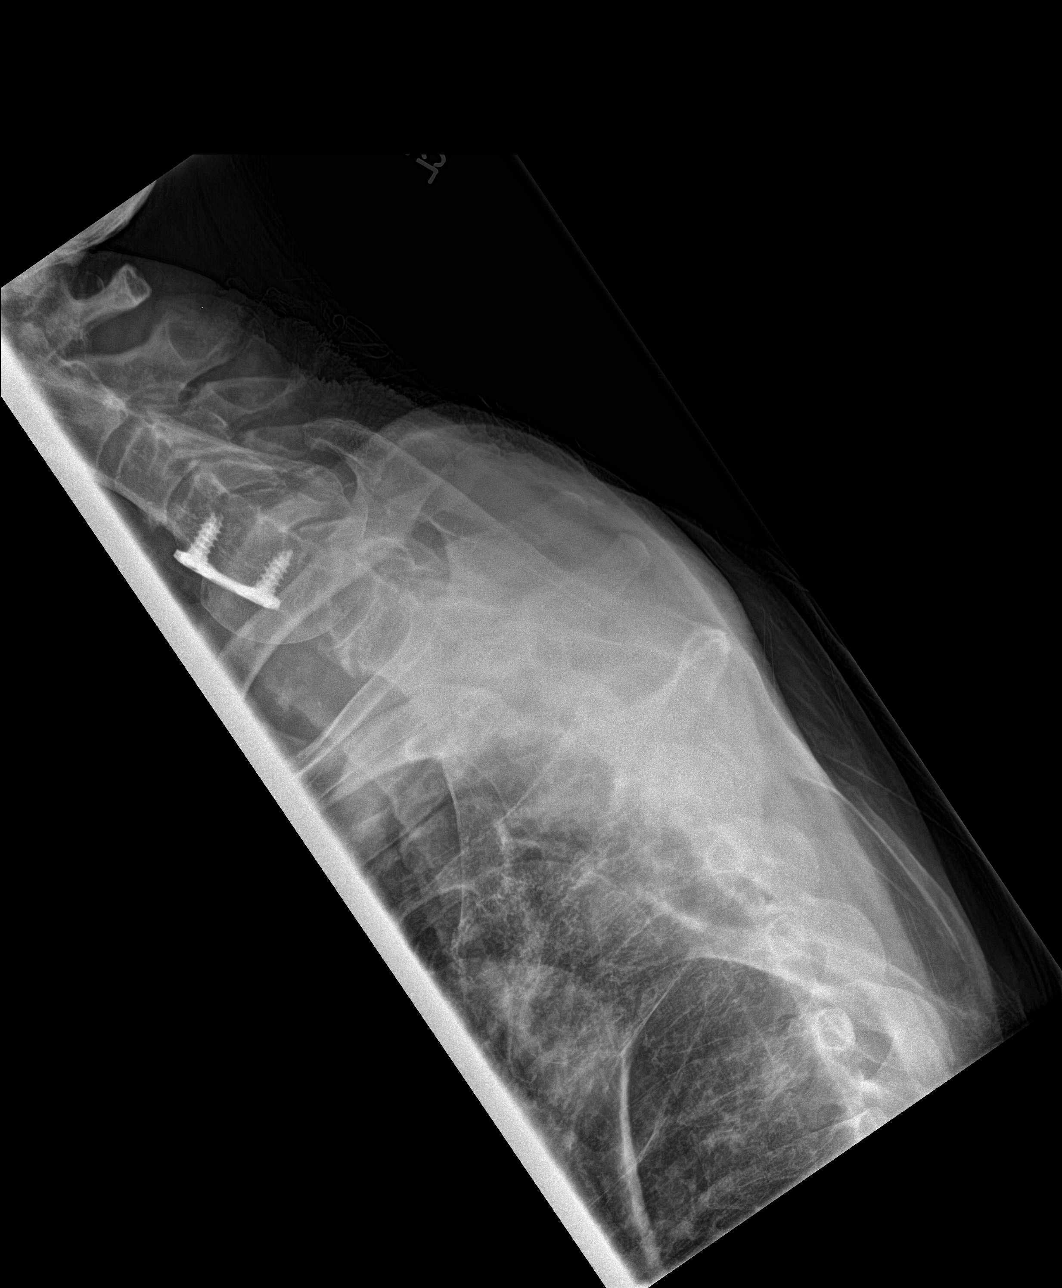

[4 of 4 positions shown; findings below may reference images not displayed]

FINDINGS: Normal alignment. Minimal for early degenerative spurring
anteriorly. No fracture or subluxation.
IMPRESSION: No acute bony abnormality.

## 2017-06-03 ENCOUNTER — Encounter: Payer: Self-pay | Admitting: Pain Medicine

## 2017-06-03 ENCOUNTER — Ambulatory Visit (HOSPITAL_BASED_OUTPATIENT_CLINIC_OR_DEPARTMENT_OTHER): Payer: Medicare Other | Admitting: Pain Medicine

## 2017-06-03 ENCOUNTER — Ambulatory Visit
Admission: RE | Admit: 2017-06-03 | Discharge: 2017-06-03 | Disposition: A | Discharge status: Home / Self Care | Payer: Medicare Other | Source: Ambulatory Visit | Attending: Pain Medicine | Admitting: Pain Medicine

## 2017-06-03 VITALS — BP 159/79 | HR 64 | Temp 98.0°F | Resp 15 | Ht 69.0 in | Wt 155.0 lb

## 2017-06-03 DIAGNOSIS — M961 Postlaminectomy syndrome, not elsewhere classified: Secondary | ICD-10-CM | POA: Diagnosis not present

## 2017-06-03 DIAGNOSIS — G8929 Other chronic pain: Secondary | ICD-10-CM | POA: Diagnosis not present

## 2017-06-03 DIAGNOSIS — M47812 Spondylosis without myelopathy or radiculopathy, cervical region: Secondary | ICD-10-CM | POA: Diagnosis present

## 2017-06-03 DIAGNOSIS — M4722 Other spondylosis with radiculopathy, cervical region: Secondary | ICD-10-CM | POA: Insufficient documentation

## 2017-06-03 DIAGNOSIS — M5412 Radiculopathy, cervical region: Secondary | ICD-10-CM

## 2017-06-03 MED ORDER — LIDOCAINE HCL 2 % IJ SOLN
10.0000 mL | Freq: Once | INTRAMUSCULAR | Status: AC
  Administered 2017-06-03: 400 mg
  Filled 2017-06-03: qty 40

## 2017-06-03 MED ORDER — DEXAMETHASONE SODIUM PHOSPHATE 10 MG/ML IJ SOLN
10.0000 mg | Freq: Once | INTRAMUSCULAR | Status: AC
  Administered 2017-06-03: 10 mg
  Filled 2017-06-03: qty 1

## 2017-06-03 MED ORDER — ROPIVACAINE HCL 2 MG/ML IJ SOLN
1.0000 mL | Freq: Once | INTRAMUSCULAR | Status: AC
  Administered 2017-06-03: 10 mL via EPIDURAL
  Filled 2017-06-03: qty 10

## 2017-06-03 MED ORDER — SODIUM CHLORIDE 0.9 % IJ SOLN
INTRAMUSCULAR | Status: AC
  Filled 2017-06-03: qty 10

## 2017-06-03 MED ORDER — IOPAMIDOL (ISOVUE-M 200) INJECTION 41%
10.0000 mL | Freq: Once | INTRAMUSCULAR | Status: AC
  Administered 2017-06-03: 10 mL via EPIDURAL
  Filled 2017-06-03: qty 10

## 2017-06-03 MED ORDER — SODIUM CHLORIDE 0.9% FLUSH
1.0000 mL | Freq: Once | INTRAVENOUS | Status: AC
  Administered 2017-06-03: 10 mL

## 2017-06-03 NOTE — Progress Notes (Signed)
Safety precautions to be maintained throughout the outpatient stay will include: orient to surroundings, keep bed in low position, maintain call bell within reach at all times, provide assistance with transfer out of bed and ambulation.  

## 2017-06-03 NOTE — Progress Notes (Signed)
Patient's Name: Rick SEPTER Sr.  MRN: [id number]  Referring Provider: [facility], [place]  DOB: 07/06/1951  PCP: [facility], [place]  DOS: 06/03/2017  Note by: Gaspar Cola, MD  Service setting: Ambulatory outpatient  Specialty: Interventional Pain Management  Patient type: Established  Location: [facility] (AMB) Pain Management Facility  Visit type: Interventional Procedure   Primary Reason for Visit: Interventional Pain Management Treatment. CC: Neck Pain  Procedure:  Anesthesia, Analgesia, Anxiolysis:  Type: Palliative, Inter-Laminar, Epidural Steroid Injection Region: Posterior Cervico-thoracic Region Level: C7-T1 Laterality: Left-Sided Paramedial  Type: Local Anesthesia Local Anesthetic: Lidocaine 1% Route: Infiltration ([place]/IM) IV Access: Declined Sedation: Declined  Indication(s): Analgesia          Indications: 1. Cervical spondylosis   2. Chronic cervical radicular pain (Left)   3. Failed cervical surgery syndrome    Pain Score: Pre-procedure: 5 /10 Post-procedure: 0-No pain/10  Pre-op Assessment:  Rick Carter is a 66 y.o. (year old), male patient, seen today for interventional treatment. He  has a past surgical history that includes Tonsillectomy; Lung removal, partial; and Neck surgery. [name] has a current medication list which includes the following prescription(s): aclidinium bromide, advair diskus, albuterol, aspirin ec, calcium carb-cholecalciferol, cholecalciferol, diphenhydramine, docusate sodium, fluticasone-salmeterol, hydrochlorothiazide, hydroxyzine, ibuprofen, ipratropium-albuterol, loratadine, metformin, methocarbamol, multiple vitamins-minerals, oxycodone-acetaminophen, pantoprazole, pregabalin, sildenafil, sodium chloride hypertonic, trazodone, tudorza pressair, umeclidinium bromide, oxycodone-acetaminophen, and oxycodone-acetaminophen. His primarily concern today is the Neck Pain  Initial Vital Signs: Blood pressure (!) 154/76, pulse 64, temperature 98 F  (36.7 C), temperature source Oral, resp. rate 18, height _0  (1.753 m), weight 155 lb (70.3 kg), SpO2 98 %. BMI: Estimated body mass index is 22.89 kg/m as calculated from the following:   Height as of this encounter: _1  (1.753 m).   Weight as of this encounter: 155 lb (70.3 kg).  Risk Assessment: Allergies: Reviewed. He is allergic to atorvastatin and varenicline.  Allergy Precautions: None required Coagulopathies: Reviewed. None identified.  Blood-thinner therapy: None at this time Active Infection(s): Reviewed. None identified. Rick Carter is afebrile  Site Confirmation: Rick Carter was asked to confirm the procedure and laterality before marking the site Procedure checklist: Completed Consent: Before the procedure and under the influence of no sedative(s), amnesic(s), or anxiolytics, the patient was informed of the treatment options, risks and possible complications. To fulfill our ethical and legal obligations, as recommended by the American Medical Association's Code of Ethics, I have informed the patient of my clinical impression; the nature and purpose of the treatment or procedure; the risks, benefits, and possible complications of the intervention; the alternatives, including doing nothing; the risk(s) and benefit(s) of the alternative treatment(s) or procedure(s); and the risk(s) and benefit(s) of doing nothing. The patient was provided information about the general risks and possible complications associated with the procedure. These may include, but are not limited to: failure to achieve desired goals, infection, bleeding, organ or nerve damage, allergic reactions, paralysis, and death. In addition, the patient was informed of those risks and complications associated to Spine-related procedures, such as failure to decrease pain; infection (i.e.: Meningitis, epidural or intraspinal abscess); bleeding (i.e.: epidural hematoma, subarachnoid hemorrhage, or any other type of intraspinal  or peri-dural bleeding); organ or nerve damage (i.e.: Any type of peripheral nerve, nerve root, or spinal cord injury) with subsequent damage to sensory, motor, and/or autonomic systems, resulting in permanent pain, numbness, and/or weakness of one or several areas of the body; allergic reactions; (i.e.: anaphylactic reaction); and/or death. Furthermore, the patient was  informed of those risks and complications associated with the medications. These include, but are not limited to: allergic reactions (i.e.: anaphylactic or anaphylactoid reaction(s)); adrenal axis suppression; blood sugar elevation that in diabetics may result in ketoacidosis or comma; water retention that in patients with history of congestive heart failure may result in shortness of breath, pulmonary edema, and decompensation with resultant heart failure; weight gain; swelling or edema; medication-induced neural toxicity; particulate matter embolism and blood vessel occlusion with resultant organ, and/or nervous system infarction; and/or aseptic necrosis of one or more joints. Finally, the patient was informed that Medicine is not an exact science; therefore, there is also the possibility of unforeseen or unpredictable risks and/or possible complications that may result in a catastrophic outcome. The patient indicated having understood very clearly. We have given the patient no guarantees and we have made no promises. Enough time was given to the patient to ask questions, all of which were answered to the patient's satisfaction. Rick Carter has indicated that he wanted to continue with the procedure. Attestation: I, the ordering provider, attest that I have discussed with the patient the benefits, risks, side-effects, alternatives, likelihood of achieving goals, and potential problems during recovery for the procedure that I have provided informed consent. Date: 06/03/2017; Time: 11:27 AM  Pre-Procedure Preparation:  Monitoring: As per clinic  protocol. Respiration, ETCO2, SpO2, BP, heart rate and rhythm monitor placed and checked for adequate function Safety Precautions: Patient was assessed for positional comfort and pressure points before starting the procedure. Time-out: I initiated and conducted the "Time-out" before starting the procedure, as per protocol. The patient was asked to participate by confirming the accuracy of the "Time Out" information. Verification of the correct person, site, and procedure were performed and confirmed by me, the nursing staff, and the patient. "Time-out" conducted as per Joint Commission's Universal Protocol (UP.01.01.01). "Time-out" Date & Time: 06/03/2017; 1222 hrs.  Description of Procedure Process:   Position: Prone with head of the table was raised to facilitate breathing. Target Area: For Epidural Steroid injections the target is the interlaminar space, initially targeting the lower border of the superior vertebral body lamina. Approach: Paramedial approach. Area Prepped: Entire PosteriorCervical Region Prepping solution: ChloraPrep (2% chlorhexidine gluconate and 70% isopropyl alcohol) Safety Precautions: Aspiration looking for blood return was conducted prior to all injections. At no point did we inject any substances, as a needle was being advanced. No attempts were made at seeking any paresthesias. Safe injection practices and needle disposal techniques used. Medications properly checked for expiration dates. SDV (single dose vial) medications used. Description of the Procedure: Protocol guidelines were followed. The procedure needle was introduced through the skin, ipsilateral to the reported pain, and advanced to the target area. Bone was contacted and the needle walked caudad, until the lamina was cleared. The epidural space was identified using "loss-of-resistance technique" with 2-3 ml of PF-NaCl (0.9% NSS), in a 5cc LOR glass syringe. Vitals:   06/03/17 1114 06/03/17 1223 06/03/17 1228   BP: (!) 154/76 (!) 138/100 (!) 159/79  Pulse: 64    Resp: _0 Temp: 98 F (36.7 C)    TempSrc: Oral    SpO2: 98% 100%   Weight: 155 lb (70.3 kg)    Height: _1  (1.753 m)      Start Time: 1223 hrs. End Time: 1228 (5.5 cm C7 t1 left) hrs. Materials:  Needle(s) Type: Epidural needle Gauge: 17G Length: 3.5-in Medication(s): We administered iopamidol, dexamethasone, ropivacaine (PF) 2 mg/mL (0.2%), sodium chloride  flush, and lidocaine. Please see chart orders for dosing details.  Imaging Guidance (Spinal):  Type of Imaging Technique: Fluoroscopy Guidance (Spinal) Indication(s): Assistance in needle guidance and placement for procedures requiring needle placement in or near specific anatomical locations not easily accessible without such assistance. Exposure Time: Please see nurses notes. Contrast: Before injecting any contrast, we confirmed that the patient did not have an allergy to iodine, shellfish, or radiological contrast. Once satisfactory needle placement was completed at the desired level, radiological contrast was injected. Contrast injected under live fluoroscopy. No contrast complications. See chart for type and volume of contrast used. Fluoroscopic Guidance: I was personally present during the use of fluoroscopy. "Tunnel Vision Technique" used to obtain the best possible view of the target area. Parallax error corrected before commencing the procedure. "Direction-depth-direction" technique used to introduce the needle under continuous pulsed fluoroscopy. Once target was reached, antero-posterior, oblique, and lateral fluoroscopic projection used confirm needle placement in all planes. Images permanently stored in EMR. Interpretation: I personally interpreted the imaging intraoperatively. Adequate needle placement confirmed in multiple planes. Appropriate spread of contrast into desired area was observed. No evidence of afferent or efferent intravascular uptake. No intrathecal  or subarachnoid spread observed. Permanent images saved into the patient's record.  Antibiotic Prophylaxis:  Indication(s): None identified Antibiotic given: None  Post-operative Assessment:  EBL: None Complications: No immediate post-treatment complications observed by team, or reported by patient. Note: The patient tolerated the entire procedure well. A repeat set of vitals were taken after the procedure and the patient was kept under observation following institutional policy, for this type of procedure. Post-procedural neurological assessment was performed, showing return to baseline, prior to discharge. The patient was provided with post-procedure discharge instructions, including a section on how to identify potential problems. Should any problems arise concerning this procedure, the patient was given instructions to immediately contact us, at any time, without hesitation. In any case, we plan to contact the patient by telephone for a follow-up status report regarding this interventional procedure. Comments:  No additional relevant information.  Plan of Care  Disposition: Discharge home  Discharge Date & Time: 06/03/2017; 1230 hrs.  Physician-requested Follow-up:  Return for Med-Mgmt by Dionisio David, NP.  New Prescriptions   No medications on file   Future Appointments Date Time Provider [place]  06/26/2017 10:30 AM Vevelyn Francois, NP [facility] None    Imaging Orders     DG C-Arm 1-60 Min-No Report  Procedure Orders     Cervical Epidural Injection  Medications ordered for procedure: Meds ordered this encounter  Medications  . iopamidol (ISOVUE-M) 41 % intrathecal injection 10 mL  . dexamethasone (DECADRON) injection 10 mg  . ropivacaine (PF) 2 mg/mL (0.2%) (NAROPIN) injection 1 mL  . sodium chloride flush (NS) 0.9 % injection 1 mL  . lidocaine (XYLOCAINE) 2 % (with pres) injection 200 mg   Medications administered: We administered iopamidol, dexamethasone,  ropivacaine (PF) 2 mg/mL (0.2%), sodium chloride flush, and lidocaine.  See the medical record for exact dosing, route, and time of administration.  Primary Care Physician: [facility] [place] Location: [facility] Note by: Gaspar Cola, MD Date: 06/03/2017; Time: 3:19 PM  Disclaimer:  Medicine is not an Chief Strategy Officer. The only guarantee in medicine is that nothing is guaranteed. It is important to note that the decision to proceed with this intervention was based on the information collected from the patient. The Data and conclusions were drawn from the patient's questionnaire, the interview,  and the physical examination. Because the information was provided in large part by the patient, it cannot be guaranteed that it has not been purposely or unconsciously manipulated. Every effort has been made to obtain as much relevant data as possible for this evaluation. It is important to note that the conclusions that lead to this procedure are derived in large part from the available data. Always take into account that the treatment will also be dependent on availability of resources and existing treatment guidelines, considered by other Pain Management Practitioners as being common knowledge and practice, at the time of the intervention. For Medico-Legal purposes, it is also important to point out that variation in procedural techniques and pharmacological choices are the acceptable norm. The indications, contraindications, technique, and results of the above procedure should only be interpreted and judged by a Board-Certified Interventional Pain Specialist with extensive familiarity and expertise in the same exact procedure and technique.

## 2017-06-03 NOTE — Patient Instructions (Addendum)
_______________________________________________________________________________________You will be getting Baclofen at your pharmaacy.  Do not take the Robaxin_____  Post-Procedure instructions Instructions:  Apply ice: Fill a plastic sandwich bag with crushed ice. Cover it with a small towel and apply to injection site. Apply for 15 minutes then remove x 15 minutes. Repeat sequence on day of procedure, until you go to bed. The purpose is to minimize swelling and discomfort after procedure.  Apply heat: Apply heat to procedure site starting the day following the procedure. The purpose is to treat any soreness and discomfort from the procedure.  Food intake: Start with clear liquids (like water) and advance to regular food, as tolerated.   Physical activities: Keep activities to a minimum for the first 8 hours after the procedure.   Driving: If you have received any sedation, you are not allowed to drive for 24 hours after your procedure.  Blood thinner: Restart your blood thinner 6 hours after your procedure. (Only for those taking blood thinners)  Insulin: As soon as you can eat, you may resume your normal dosing schedule. (Only for those taking insulin)  Infection prevention: Keep procedure site clean and dry.  Post-procedure Pain Diary: Extremely important that this be done correctly and accurately. Recorded information will be used to determine the next step in treatment.  Pain evaluated is that of treated area only. Do not include pain from an untreated area.  Complete every hour, on the hour, for the initial 8 hours. Set an alarm to help you do this part accurately.  Do not go to sleep and have it completed later. It will not be accurate.  Follow-up appointment: Keep your follow-up appointment after the procedure. Usually 2 weeks for most procedures. (6 weeks in the case of radiofrequency.) Bring you pain diary.  Expect:  From numbing medicine (AKA: Local Anesthetics): Numbness  or decrease in pain.  Onset: Full effect within 15 minutes of injected.  Duration: It will depend on the type of local anesthetic used. On the average, 1 to 8 hours.   From steroids: Decrease in swelling or inflammation. Once inflammation is improved, relief of the pain will follow.  Onset of benefits: Depends on the amount of swelling present. The more swelling, the longer it will take for the benefits to be seen. In some cases, up to 10 days.  Duration: Steroids will stay in the system x 2 weeks. Duration of benefits will depend on multiple posibilities including persistent irritating factors.  From procedure: Some discomfort is to be expected once the numbing medicine wears off. This should be minimal if ice and heat are applied as instructed. Call if:  You experience numbness and weakness that gets worse with time, as opposed to wearing off.  New onset bowel or bladder incontinence. (Spinal procedures only)  Emergency Numbers:  Durning business hours (Monday - Thursday, 8:00 AM - 4:00 PM) (Friday, 9:00 AM - 12:00 Noon): [phone]  After hours: [phone] ____________________________________________________________________________________________  Pain Management Discharge Instructions  General Discharge Instructions :  If you need to reach your doctor call: Monday-Friday 8:00 am - 4:00 pm at [phone] or toll free [phone].  After clinic hours [phone] to have operator reach doctor.  Bring all of your medication bottles to all your appointments in the pain clinic.  To cancel or reschedule your appointment with Pain Management please remember to call 24 hours in advance to avoid a fee.  Refer to the educational materials which you have been given on: General Risks, I had my  Procedure. Discharge Instructions, Post Sedation.  Post Procedure Instructions:  The drugs you were given will stay in your system until tomorrow, so for the next 24 hours you  should not drive, make any legal decisions or drink any alcoholic beverages.  You may eat anything you prefer, but it is better to start with liquids then soups and crackers, and gradually work up to solid foods.  Please notify your doctor immediately if you have any unusual bleeding, trouble breathing or pain that is not related to your normal pain.  Depending on the type of procedure that was done, some parts of your body may feel week and/or numb.  This usually clears up by tonight or the next day.  Walk with the use of an assistive device or accompanied by an adult for the 24 hours.  You may use ice on the affected area for the first 24 hours.  Put ice in a Ziploc bag and cover with a towel and place against area 15 minutes on 15 minutes off.  You may switch to heat after 24 hours. Epidural Steroid Injection An epidural steroid injection is a shot of steroid medicine and numbing medicine that is given into the space between the spinal cord and the bones in your back (epidural space). The shot helps relieve pain caused by an irritated or swollen nerve root. The amount of pain relief you get from the injection depends on what is causing the nerve to be swollen and irritated, and how long your pain lasts. You are more likely to benefit from this injection if your pain is strong and comes on suddenly rather than if you have had pain for a long time. Tell a health care provider about: Any allergies you have. All medicines you are taking, including vitamins, herbs, eye drops, creams, and over-the-counter medicines. Any problems you or family members have had with anesthetic medicines. Any blood disorders you have. Any surgeries you have had. Any medical conditions you have. Whether you are pregnant or may be pregnant. What are the risks? Generally, this is a safe procedure. However, problems may occur, including: Headache. Bleeding. Infection. Allergic reaction to medicines. Damage to your  nerves.  What happens before the procedure? Staying hydrated Follow instructions from your health care provider about hydration, which may include: Up to 2 hours before the procedure - you may continue to drink clear liquids, such as water, clear fruit juice, black coffee, and plain tea.  Eating and drinking restrictions Follow instructions from your health care provider about eating and drinking, which may include: 8 hours before the procedure - stop eating heavy meals or foods such as meat, fried foods, or fatty foods. 6 hours before the procedure - stop eating light meals or foods, such as toast or cereal. 6 hours before the procedure - stop drinking milk or drinks that contain milk. 2 hours before the procedure - stop drinking clear liquids.  Medicine You may be given medicines to lower anxiety. Ask your health care provider about: Changing or stopping your regular medicines. This is especially important if you are taking diabetes medicines or blood thinners. Taking medicines such as aspirin and ibuprofen. These medicines can thin your blood. Do not take these medicines before your procedure if your health care provider instructs you not to. General instructions Plan to have someone take you home from the hospital or clinic. What happens during the procedure? You may receive a medicine to help you relax (sedative). You will be asked to  lie on your abdomen. The injection site will be cleaned. A numbing medicine (local anesthetic) will be used to numb the injection site. A needle will be inserted through your skin into the epidural space. You may feel some discomfort when this happens. An X-ray machine will be used to make sure the needle is put as close as possible to the affected nerve. A steroid medicine and a local anesthetic will be injected into the epidural space. The needle will be removed. A bandage (dressing) will be put over the injection site. What happens after the  procedure? Your blood pressure, heart rate, breathing rate, and blood oxygen level will be monitored until the medicines you were given have worn off. Your arm or leg may feel weak or numb for a few hours. The injection site may feel sore. Do not drive for 24 hours if you received a sedative. This information is not intended to replace advice given to you by your health care provider. Make sure you discuss any questions you have with your health care provider. Document Released: 01/07/2008 Document Revised: 03/13/2016 Document Reviewed: 01/16/2016 Elsevier Interactive Patient Education  2017 [employer].

## 2017-06-04 ENCOUNTER — Telehealth: Payer: Self-pay | Admitting: *Deleted

## 2017-06-04 NOTE — Telephone Encounter (Signed)
Attempted to call for post procedure follow-up. No answer.

## 2017-06-26 ENCOUNTER — Ambulatory Visit: Payer: Medicare Other | Attending: Nurse Practitioner | Admitting: Nurse Practitioner

## 2017-06-26 ENCOUNTER — Encounter: Payer: Self-pay | Admitting: Nurse Practitioner

## 2017-06-26 VITALS — BP 163/69 | HR 74 | Temp 97.2°F | Resp 18 | Ht 69.0 in | Wt 148.0 lb

## 2017-06-26 DIAGNOSIS — M5412 Radiculopathy, cervical region: Secondary | ICD-10-CM | POA: Insufficient documentation

## 2017-06-26 DIAGNOSIS — K5900 Constipation, unspecified: Secondary | ICD-10-CM | POA: Diagnosis not present

## 2017-06-26 DIAGNOSIS — F17210 Nicotine dependence, cigarettes, uncomplicated: Secondary | ICD-10-CM | POA: Diagnosis not present

## 2017-06-26 DIAGNOSIS — I739 Peripheral vascular disease, unspecified: Secondary | ICD-10-CM | POA: Diagnosis not present

## 2017-06-26 DIAGNOSIS — E785 Hyperlipidemia, unspecified: Secondary | ICD-10-CM | POA: Insufficient documentation

## 2017-06-26 DIAGNOSIS — M791 Myalgia: Secondary | ICD-10-CM

## 2017-06-26 DIAGNOSIS — I4891 Unspecified atrial fibrillation: Secondary | ICD-10-CM | POA: Insufficient documentation

## 2017-06-26 DIAGNOSIS — G8929 Other chronic pain: Secondary | ICD-10-CM

## 2017-06-26 DIAGNOSIS — M545 Low back pain: Secondary | ICD-10-CM | POA: Insufficient documentation

## 2017-06-26 DIAGNOSIS — I10 Essential (primary) hypertension: Secondary | ICD-10-CM | POA: Insufficient documentation

## 2017-06-26 DIAGNOSIS — M542 Cervicalgia: Secondary | ICD-10-CM | POA: Diagnosis present

## 2017-06-26 DIAGNOSIS — Z79891 Long term (current) use of opiate analgesic: Secondary | ICD-10-CM | POA: Diagnosis not present

## 2017-06-26 DIAGNOSIS — M797 Fibromyalgia: Secondary | ICD-10-CM | POA: Insufficient documentation

## 2017-06-26 DIAGNOSIS — M5116 Intervertebral disc disorders with radiculopathy, lumbar region: Secondary | ICD-10-CM | POA: Insufficient documentation

## 2017-06-26 DIAGNOSIS — T402X5A Adverse effect of other opioids, initial encounter: Secondary | ICD-10-CM

## 2017-06-26 DIAGNOSIS — G894 Chronic pain syndrome: Secondary | ICD-10-CM | POA: Insufficient documentation

## 2017-06-26 DIAGNOSIS — M48061 Spinal stenosis, lumbar region without neurogenic claudication: Secondary | ICD-10-CM | POA: Diagnosis not present

## 2017-06-26 DIAGNOSIS — M4696 Unspecified inflammatory spondylopathy, lumbar region: Secondary | ICD-10-CM | POA: Diagnosis not present

## 2017-06-26 DIAGNOSIS — M47816 Spondylosis without myelopathy or radiculopathy, lumbar region: Secondary | ICD-10-CM | POA: Diagnosis not present

## 2017-06-26 DIAGNOSIS — K5903 Drug induced constipation: Secondary | ICD-10-CM | POA: Diagnosis not present

## 2017-06-26 DIAGNOSIS — M792 Neuralgia and neuritis, unspecified: Secondary | ICD-10-CM | POA: Diagnosis not present

## 2017-06-26 DIAGNOSIS — K219 Gastro-esophageal reflux disease without esophagitis: Secondary | ICD-10-CM | POA: Diagnosis not present

## 2017-06-26 DIAGNOSIS — H3530 Unspecified macular degeneration: Secondary | ICD-10-CM | POA: Diagnosis not present

## 2017-06-26 DIAGNOSIS — M150 Primary generalized (osteo)arthritis: Secondary | ICD-10-CM | POA: Diagnosis not present

## 2017-06-26 DIAGNOSIS — F319 Bipolar disorder, unspecified: Secondary | ICD-10-CM | POA: Diagnosis not present

## 2017-06-26 DIAGNOSIS — M1990 Unspecified osteoarthritis, unspecified site: Secondary | ICD-10-CM | POA: Diagnosis not present

## 2017-06-26 DIAGNOSIS — J449 Chronic obstructive pulmonary disease, unspecified: Secondary | ICD-10-CM | POA: Diagnosis not present

## 2017-06-26 DIAGNOSIS — G8912 Acute post-thoracotomy pain: Secondary | ICD-10-CM | POA: Insufficient documentation

## 2017-06-26 DIAGNOSIS — M546 Pain in thoracic spine: Secondary | ICD-10-CM | POA: Insufficient documentation

## 2017-06-26 DIAGNOSIS — M5416 Radiculopathy, lumbar region: Secondary | ICD-10-CM | POA: Diagnosis not present

## 2017-06-26 DIAGNOSIS — F419 Anxiety disorder, unspecified: Secondary | ICD-10-CM | POA: Diagnosis not present

## 2017-06-26 DIAGNOSIS — M159 Polyosteoarthritis, unspecified: Secondary | ICD-10-CM

## 2017-06-26 DIAGNOSIS — M7918 Myalgia, other site: Secondary | ICD-10-CM

## 2017-06-26 MED ORDER — PREGABALIN 150 MG PO CAPS: 150.0000 mg | ORAL_CAPSULE | Freq: Two times a day (BID) | ORAL | 0 refills | Status: DC

## 2017-06-26 MED ORDER — DOCUSATE SODIUM 100 MG PO CAPS: 100.0000 mg | ORAL_CAPSULE | Freq: Every day | ORAL | 0 refills | Status: DC

## 2017-06-26 MED ORDER — OXYCODONE-ACETAMINOPHEN 5-325 MG PO TABS: 1.0000 | ORAL_TABLET | Freq: Four times a day (QID) | ORAL | 0 refills | Status: DC | PRN

## 2017-06-26 MED ORDER — IBUPROFEN 800 MG PO TABS: 800.0000 mg | ORAL_TABLET | Freq: Three times a day (TID) | ORAL | 0 refills | Status: DC | PRN

## 2017-06-26 MED ORDER — METHOCARBAMOL 750 MG PO TABS: 750.0000 mg | ORAL_TABLET | Freq: Three times a day (TID) | ORAL | 2 refills | Status: DC | PRN

## 2017-06-26 NOTE — Patient Instructions (Addendum)
____________________________________________________________________________________________  Medication Rules  Applies to: All patients receiving prescriptions (written or electronic).  Pharmacy of record: Pharmacy where electronic prescriptions will be sent. If written prescriptions are taken to a different pharmacy, please inform the nursing staff. The pharmacy listed in the electronic medical record should be the one where you would like electronic prescriptions to be sent.  Prescription refills: Only during scheduled appointments. Applies to both, written and electronic prescriptions.  NOTE: The following applies primarily to controlled substances (Opioid* Pain Medications).   Patient's responsibilities: 1. Pain Pills: Bring all pain pills to every appointment (except for procedure appointments). 2. Pill Bottles: Bring pills in original pharmacy bottle. Always bring newest bottle. Bring bottle, even if empty. 3. Medication refills: You are responsible for knowing and keeping track of what medications you need refilled. The day before your appointment, write a list of all prescriptions that need to be refilled. Bring that list to your appointment and give it to the admitting nurse. Prescriptions will be written only during appointments. If you forget a medication, it will not be "Called in", "Faxed", or "electronically sent". You will need to get another appointment to get these prescribed. 4. Prescription Accuracy: You are responsible for carefully inspecting your prescriptions before leaving our office. Have the discharge nurse carefully go over each prescription with you, before taking them home. Make sure that your name is accurately spelled, that your address is correct. Check the name and dose of your medication to make sure it is accurate. Check the number of pills, and the written instructions to make sure they are clear and accurate. Make sure that you are given enough medication to  last until your next medication refill appointment. 5. Taking Medication: Take medication as prescribed. Never take more pills than instructed. Never take medication more frequently than prescribed. Taking less pills or less frequently is permitted and encouraged, when it comes to controlled substances (written prescriptions).  6. Inform other Doctors: Always inform, all of your healthcare providers, of all the medications you take. 7. Pain Medication from other Providers: You are not allowed to accept any additional pain medication from any other Doctor or Healthcare provider. There are two exceptions to this rule. (see below) In the event that you require additional pain medication, you are responsible for notifying us, as stated below. 8. Medication Agreement: You are responsible for carefully reading and following our Medication Agreement. This must be signed before receiving any prescriptions from our practice. Safely store a copy of your signed Agreement. Violations to the Agreement will result in no further prescriptions. (Additional copies of our Medication Agreement are available upon request.) 9. Laws, Rules, & Regulations: All patients are expected to follow all Federal and [employer], [employer], Rules, [employer]. Ignorance of the Laws does not constitute a valid excuse. The use of any illegal substances is prohibited. 10. Adopted CDC guidelines & recommendations: Target dosing levels will be at or below 60 MME/day. Use of benzodiazepines** is not recommended.  Exceptions: There are only two exceptions to the rule of not receiving pain medications from other Healthcare Providers. 1. Exception #1 (Emergencies): In the event of an emergency (i.e.: accident requiring emergency care), you are allowed to receive additional pain medication. However, you are responsible for: As soon as you are able, call our office [phone], at any time of the day or night, and leave a message stating your  name, the date and nature of the emergency, and the name and dose of the medication  prescribed. In the event that your call is answered by a member of our staff, make sure to document and save the date, time, and the name of the person that took your information.  2. Exception #2 (Planned Surgery): In the event that you are scheduled by another doctor or dentist to have any type of surgery or procedure, you are allowed (for a period no longer than 30 days), to receive additional pain medication, for the acute post-op pain. However, in this case, you are responsible for picking up a copy of our "Post-op Pain Management for Surgeons" handout, and giving it to your surgeon or dentist. This document is available at our office, and does not require an appointment to obtain it. Simply go to our office during business hours (Monday-Thursday from 8:00 AM to 4:00 PM) (Friday 8:00 AM to 12:00 Noon) or if you have a scheduled appointment with [place], prior to your surgery, and ask for it by name. In addition, you will need to provide [place] with your name, name of your surgeon, type of surgery, and date of procedure or surgery.  *Opioid medications include: morphine, codeine, oxycodone, oxymorphone, hydrocodone, hydromorphone, meperidine, tramadol, tapentadol, buprenorphine, fentanyl, methadone. **Benzodiazepine medications include: diazepam (Valium), alprazolam (Xanax), clonazepam (Klonopine), lorazepam (Ativan), clorazepate (Tranxene), chlordiazepoxide (Librium), estazolam (Prosom), oxazepam (Serax), temazepam (Restoril), triazolam (Halcion)  ____________________________________________________________________________________________  BMI interpretation table: BMI level Category Range association with higher incidence of chronic pain  <18 kg/m2 Underweight   18.5-24.9 kg/m2 Ideal body weight   25-29.9 kg/m2 Overweight Increased incidence by 20%  30-34.9 kg/m2 Obese (Class I) Increased incidence by 68%  35-39.9 kg/m2  Severe obesity (Class II) Increased incidence by 136%  >40 kg/m2 Extreme obesity (Class III) Increased incidence by 254%   BMI Readings from Last 4 Encounters:  06/26/17 21.86 kg/m  06/03/17 22.89 kg/m  04/30/17 22.45 kg/m  03/27/17 22.74 kg/m   Wt Readings from Last 4 Encounters:  06/26/17 148 lb (67.1 kg)  06/03/17 155 lb (70.3 kg)  04/30/17 152 lb (68.9 kg)  03/27/17 154 lb (69.9 kg)

## 2017-06-26 NOTE — Progress Notes (Signed)
Nursing Pain Medication Assessment:  Safety precautions to be maintained throughout the outpatient stay will include: orient to surroundings, keep bed in low position, maintain call bell within reach at all times, provide assistance with transfer out of bed and ambulation.  Medication Inspection Compliance: Pill count conducted under aseptic conditions, in front of the patient. Neither the pills nor the bottle was removed from the patient's sight at any time. Once count was completed pills were immediately returned to the patient in their original bottle.  Medication: Oxycodone IR Pill/Patch Count: 39 of 120 pills remain Pill/Patch Appearance: Markings consistent with prescribed medication Bottle Appearance: Standard pharmacy container. Clearly labeled. Filled Date: 07/ / 25 / 2018 Last Medication intake:  Today

## 2017-06-26 NOTE — Progress Notes (Signed)
Patient's Name: Rick Carter.  MRN: [id number]  Referring Provider: [place]  DOB: 1950/12/17  PCP: Travis  DOS: 06/26/2017  Note by: Vevelyn Francois NP  Service setting: Ambulatory outpatient  Specialty: Interventional Pain Management  Location: [facility] (AMB) Pain Management Facility    Patient type: Established    Primary Reason(s) for Visit: Encounter for prescription drug management & post-procedure evaluation of chronic illness with mild to moderate exacerbation(Level of risk: moderate) CC: Back Pain (low, mid and upper)  HPI  Rick Carter is a 66 y.o. year old, male patient, who comes today for a post-procedure evaluation and medication management. 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[place]); Atrial fibrillation ([place]); Bipolar affective disorder ([place]); Chronic obstructive pulmonary disease ([place]); Degeneration of intervertebral disc of cervical region; Colon, diverticulosis; Acid reflux; Essential (primary) hypertension; HLD (hyperlipidemia); Breath shortness; Compulsive tobacco user syndrome; Diverticular disease of large intestine; Post-thoracotomy pain syndrome; Encounter for therapeutic drug level monitoring; Long term current use of opiate analgesic; Long term prescription opiate use; Uncomplicated opioid dependence ([place]); Opiate use (30 MME/Day); Fibromyalgia; Chronic pain syndrome; Lumbar facet syndrome (Location of Primary Source of Pain) (Bilateral) (R>L); History of alcoholism ([place]); Chronic low back pain (Location of Primary Source of Pain) (midline); Lumbar spondylosis; Cervical spondylosis; Failed cervical surgery syndrome; Nicotine dependence; Tobacco abuse; Substance Use Disorder Risk: High; History of right-sided partial pneumonectomy; Atherosclerotic peripheral vascular disease ([place]); Musculoskeletal pain; Neurogenic pain; Neuropathic pain; Opioid-induced constipation (OIC); Osteoarthritis; NSAID induced gastritis; Thoracic spine pain; Thoracic  radicular pain (Right); Chronic lumbar radicular pain (L5/S1 dermatome) (Location of Secondary source of pain) (Right); Lumbar foraminal stenosis (L5-S1) (Right); Lumbar facet arthropathy (Bilateral) (R>L); Lumbar disc protrusion (L3-4) (Right); Chronic cervical radicular pain (Left); Tumor of parotid gland; Trigger finger, left ring finger; and Disturbance of skin sensation on his problem list. His primarily concern today is the Back Pain (low, mid and upper)  Pain Assessment: Location: Mid, Upper, Lower Back Radiating:   Onset: More than a month ago Duration: Chronic pain Quality: Nagging Severity: 4 /10 (self-reported pain score)  Note: Reported level is compatible with observation.                   Effect on ADL:   Timing: Constant Modifying factors: heating pad  Rick Carter was last seen on Visit date not found for a procedure. During today's appointment we reviewed Rick Carter's post-procedure results, as well as his outpatient medication regimen. He has pain in his neck, upper and lower back. He states that his pain wears off one hour before they are due. He states that he did have relief with his cervical ESI. He denies any concerns about his care today . He is under increased stress to his significant other having dementia. He feels like she is getting worse.   Further details on both, my assessment(s), as well as the proposed treatment plan, please see below.  Controlled Substance Pharmacotherapy Assessment REMS (Risk Evaluation and Mitigation Strategy)  Analgesic:Oxycodone/APAP 5/325 one every 6 hours (20 mg/day) MME/day:30 mg/day   Hart [place], RN  06/26/2017 10:45 AM  Sign at close encounter Nursing Pain Medication Assessment:  Safety precautions to be maintained throughout the outpatient stay will include: orient to surroundings, keep bed in low position, maintain call bell within reach at all times, provide assistance with transfer out of bed and ambulation.   Medication Inspection Compliance: Pill count conducted under aseptic conditions, in front of the patient. Neither the  pills nor the bottle was removed from the patient's sight at any time. Once count was completed pills were immediately returned to the patient in their original bottle.  Medication: Oxycodone IR Pill/Patch Count: 39 of 120 pills remain Pill/Patch Appearance: Markings consistent with prescribed medication Bottle Appearance: Standard pharmacy container. Clearly labeled. Filled Date: 07/ / 25 / 2018 Last Medication intake:  Today   Pharmacokinetics: Liberation and absorption (onset of action): WNL Distribution (time to peak effect): WNL Metabolism and excretion (duration of action): WNL         Pharmacodynamics: Desired effects: Analgesia: Rick Carter reports >50% benefit. Functional ability: Patient reports that medication allows him to accomplish basic ADLs Clinically meaningful improvement in function (CMIF): Sustained CMIF goals met Perceived effectiveness: Described as relatively effective, allowing for increase in activities of daily living (ADL) Undesirable effects: Side-effects or Adverse reactions: None reported Monitoring: [place] PMP: Online review of the past 86-monthperiod conducted. Compliant with practice rules and regulations List of all UDS test(s) done:  Lab Results  Component Value Date   TOXASSSELUR FINAL 12/31/2016   TOXASSSELUR FINAL 04/01/2016   TOXASSSELUR FINAL 12/27/2015   TOXASSSELUR FINAL 10/31/2015   TOXASSSELUR FINAL 10/02/2015   Last UDS on record: ToxAssure Select 13  Date Value Ref Range Status  12/31/2016 FINAL  Final    Comment:    ==================================================================== TOXASSURE SELECT 13 (MW) ==================================================================== Test                             Result       Flag       Units Drug Present and Declared for Prescription Verification   Oxycodone                       555          EXPECTED   ng/mg creat   Oxymorphone                    1827         EXPECTED   ng/mg creat   Noroxycodone                   1518         EXPECTED   ng/mg creat   Noroxymorphone                 633          EXPECTED   ng/mg creat    Sources of oxycodone are scheduled prescription medications.    Oxymorphone, noroxycodone, and noroxymorphone are expected    metabolites of oxycodone. Oxymorphone is also available as a    scheduled prescription medication. ==================================================================== Test                      Result    Flag   Units      Ref Range   Creatinine              51               mg/dL      >=20 ==================================================================== Declared Medications:  The flagging and interpretation on this report are based on the  following declared medications.  Unexpected results may arise from  inaccuracies in the declared medications.  **Note: The testing scope of this panel includes these medications:  Oxycodone (Oxycodone Acetaminophen)  **Note: The testing scope of this  panel does not include following  reported medications:  Acetaminophen (Oxycodone Acetaminophen)  Aclidinium  Albuterol  Albuterol (Ipratropium-Albuterol)  Aspirin  Calcium Carbonate (Calcium carbonate/Vitamin D)  Diphenhydramine  Docusate  Fluticasone  Hydrochlorothiazide  Hydroxyzine  Ibuprofen  Ipratropium (Ipratropium-Albuterol)  Loratadine  Metformin  Methocarbamol  Multivitamin  Pantoprazole  Pregabalin  Salmeterol  Sildenafil  Sodium Chloride  Trazodone  Vitamin D (Calcium carbonate/Vitamin D) ==================================================================== For clinical consultation, please call [phone]. ====================================================================    UDS interpretation: Compliant          Medication Assessment Form: Reviewed. Patient indicates being compliant with  therapy Treatment compliance: Compliant Risk Assessment Profile: Aberrant behavior: See prior evaluations. None observed or detected today Comorbid factors increasing risk of overdose: See prior notes. No additional risks detected today Risk of substance use disorder (SUD): Low     Opioid Risk Tool - 04/30/17 0851      Family History of Substance Abuse   Alcohol Positive Male   Illegal Drugs Negative   Rx Drugs Negative     Personal History of Substance Abuse   Alcohol Negative   Illegal Drugs Negative   Rx Drugs Negative     Age   Age between 10-45 years  No     History of Preadolescent Sexual Abuse   History of Preadolescent Sexual Abuse Negative or Male     Psychological Disease   Psychological Disease Negative   Bipolar Positive   Depression Negative     Total Score   Opioid Risk Tool Scoring 3   Opioid Risk Interpretation Low Risk     ORT Scoring interpretation table:  Score <3 = Low Risk for SUD  Score between 4-7 = Moderate Risk for SUD  Score >8 = High Risk for Opioid Abuse   Risk Mitigation Strategies:  Patient Counseling: Covered Patient-Prescriber Agreement (PPA): Present and active  Notification to other healthcare providers: Done  Pharmacologic Plan: No change in therapy, at this time  Post-Procedure Assessment  Visit date not found Procedure: 06/03/17 Pre-procedure pain score:  5/10 Post-procedure pain score: 0/10         Influential Factors: BMI: 21.86 kg/m Intra-procedural challenges: None observed.         Assessment challenges: None detected.              Reported side-effects: None.        Post-procedural adverse reactions or complications: None reported         Sedation: Please see nurses note. When no sedatives are used, the analgesic levels obtained are directly associated to the effectiveness of the local anesthetics. However, when sedation is provided, the level of analgesia obtained during the initial 1 hour following the  intervention, is believed to be the result of a combination of factors. These factors may include, but are not limited to: 1. The effectiveness of the local anesthetics used. 2. The effects of the analgesic(s) and/or anxiolytic(s) used. 3. The degree of discomfort experienced by the patient at the time of the procedure. 4. The patients ability and reliability in recalling and recording the events. 5. The presence and influence of possible secondary gains and/or psychosocial factors. Reported result: Relief experienced during the 1st hour after the procedure: 100 % (Ultra-Short Term Relief)            Interpretative annotation: Clinically appropriate result. Analgesia during this period is likely to be Local Anesthetic and/or IV Sedative (Analgesic/Anxiolytic) related.          Effects of local  anesthetic: The analgesic effects attained during this period are directly associated to the localized infiltration of local anesthetics and therefore cary significant diagnostic value as to the etiological location, or anatomical origin, of the pain. Expected duration of relief is directly dependent on the pharmacodynamics of the local anesthetic used. Long-acting (4-6 hours) anesthetics used.  Reported result: Relief during the next 4 to 6 hour after the procedure: 80 % (Short-Term Relief)            Interpretative annotation: Clinically appropriate result. Analgesia during this period is likely to be Local Anesthetic-related.          Long-term benefit: Defined as the period of time past the expected duration of local anesthetics (1 hour for short-acting and 4-6 hours for long-acting). With the possible exception of prolonged sympathetic blockade from the local anesthetics, benefits during this period are typically attributed to, or associated with, other factors such as analgesic sensory neuropraxia, antiinflammatory effects, or beneficial biochemical changes provided by agents other than the local  anesthetics.  Reported result: Extended relief following procedure: 30 % (Long-Term Relief)            Interpretative annotation: Clinically appropriate result. Good relief. No permanent benefit expected. Inflammation plays a part in the etiology to the pain.          Current benefits: Defined as persistent relief that continues at this point in time.   Reported results: Treated area: <50 %       Interpretative annotation: Recurrence of symptoms. No permanent benefit expected. Effective diagnostic intervention.          Interpretation: Results would suggest a successful diagnostic intervention.                  Plan:  Please see "Plan of Care" for details.  Laboratory Chemistry  Inflammation Markers (CRP: Acute Phase) (ESR: Chronic Phase) Lab Results  Component Value Date   CRP <0.8 01/06/2017   ESRSEDRATE 5 01/06/2017                 Renal Function Markers Lab Results  Component Value Date   BUN 12 01/06/2017   CREATININE 0.79 01/06/2017   GFRAA >60 01/06/2017   GFRNONAA >60 01/06/2017                 Hepatic Function Markers Lab Results  Component Value Date   AST 30 01/06/2017   ALT 24 01/06/2017   ALBUMIN 4.2 01/06/2017   ALKPHOS 53 01/06/2017                 Electrolytes Lab Results  Component Value Date   NA 142 01/06/2017   K 4.0 01/06/2017   CL 107 01/06/2017   CALCIUM 9.7 01/06/2017   MG 2.2 01/06/2017                 Neuropathy Markers Lab Results  Component Value Date   VITAMINB12 637 01/06/2017                 Bone Pathology Markers Lab Results  Component Value Date   ALKPHOS 53 01/06/2017   25OHVITD1 58 01/06/2017   25OHVITD2 <1.0 01/06/2017   25OHVITD3 58 01/06/2017   CALCIUM 9.7 01/06/2017                 Coagulation Parameters No results found for: INR, LABPROT, APTT, PLT               Cardiovascular Markers No results  found for: BNP, HGB, HCT               Note: Lab results reviewed.  Recent Diagnostic Imaging Review  Dg C-arm  1-60 Min-no Report  Result Date: 06/03/2017 Fluoroscopy was utilized by the requesting physician.  No radiographic interpretation.   Note: Imaging results reviewed.          Meds   Current Outpatient Prescriptions:  .  Aclidinium Bromide (TUDORZA PRESSAIR IN), Inhale 40 mcg into the lungs 2 (two) times daily., Disp: , Rfl:  .  ADVAIR DISKUS 250-50 MCG/DOSE AEPB, INHALE 1 PUFF 2 (TWO) TIMES A DAY., Disp: , Rfl: 11 .  albuterol (PROVENTIL) (2.5 MG/3ML) 0.083% nebulizer solution, INHALE 0.5 ML (2.5 MG TOTAL) BY NEBULIZATION EVERY SIX (6) HOURS AS NEEDED FOR WHEEZING., Disp: , Rfl: 11 .  aspirin EC 81 MG tablet, Take 81 mg by mouth daily., Disp: , Rfl:  .  Calcium Carb-Cholecalciferol 600-800 MG-UNIT TABS, TAKE 1 TABLET BY MOUTH 2 TIMES A DAY, Disp: , Rfl:  .  cholecalciferol (VITAMIN D) 1000 UNITS tablet, Take 2,000 Units by mouth 2 (two) times daily. , Disp: , Rfl:  .  diphenhydrAMINE (BENADRYL) 25 mg capsule, Take 25 mg by mouth 2 (two) times daily. , Disp: , Rfl:  .  [START ON 07/06/2017] docusate sodium (COLACE) 100 MG capsule, Take 1 capsule (100 mg total) by mouth daily., Disp: 90 capsule, Rfl: 0 .  Fluticasone-Salmeterol (ADVAIR) 500-50 MCG/DOSE AEPB, Inhale 1 puff into the lungs 2 (two) times daily., Disp: , Rfl:  .  hydrochlorothiazide (MICROZIDE) 12.5 MG capsule, TAKE ONE CAPSULE BY MOUTH FOR HIGH BLOOD PRESSURE, Disp: , Rfl: 3 .  hydrOXYzine (ATARAX/VISTARIL) 25 MG tablet, Take 25 mg by mouth as needed., Disp: , Rfl:  .  [START ON 07/06/2017] ibuprofen (ADVIL,MOTRIN) 800 MG tablet, Take 1 tablet (800 mg total) by mouth every 8 (eight) hours as needed for moderate pain., Disp: 270 tablet, Rfl: 0 .  ipratropium-albuterol (DUONEB) 0.5-2.5 (3) MG/3ML SOLN, Take 3 mLs by nebulization every 6 (six) hours as needed., Disp: , Rfl:  .  loratadine (CLARITIN) 10 MG tablet, Take 10 mg by mouth. , Disp: , Rfl:  .  metFORMIN (GLUCOPHAGE) 500 MG tablet, Take 500 mg by mouth 2 (two) times daily with a  meal., Disp: , Rfl:  .  [START ON 07/06/2017] methocarbamol (ROBAXIN) 750 MG tablet, Take 1 tablet (750 mg total) by mouth every 8 (eight) hours as needed for muscle spasms., Disp: 90 tablet, Rfl: 2 .  Multiple Vitamins-Minerals (PRESERVISION AREDS 2 PO), Take 1 tablet by mouth 2 (two) times daily., Disp: , Rfl:  .  [START ON 07/06/2017] oxyCODONE-acetaminophen (PERCOCET/ROXICET) 5-325 MG tablet, Take 1 tablet by mouth every 6 (six) hours as needed for severe pain., Disp: 120 tablet, Rfl: 0 .  pantoprazole (PROTONIX) 40 MG tablet, Take 40 mg by mouth., Disp: , Rfl:  .  [START ON 07/06/2017] pregabalin (LYRICA) 150 MG capsule, Take 1 capsule (150 mg total) by mouth 2 (two) times daily., Disp: 180 capsule, Rfl: 0 .  sildenafil (VIAGRA) 100 MG tablet, 100 mg., Disp: , Rfl:  .  sodium chloride HYPERTONIC 3 % nebulizer solution, INHALE 4 ML BY NEBULIZATION TWICE A DAY, Disp: , Rfl: 6 .  traZODone (DESYREL) 50 MG tablet, Take 50 mg by mouth at bedtime. Reported on 02/20/2016, Disp: , Rfl:  .  TUDORZA PRESSAIR 400 MCG/ACT AEPB, Inhale 1 puff into the lungs 2 (two) times daily., Disp: ,  Rfl: 11 .  umeclidinium bromide (INCRUSE ELLIPTA) 62.5 MCG/INH AEPB, Inhale into the lungs., Disp: , Rfl:  .  [START ON 08/05/2017] oxyCODONE-acetaminophen (PERCOCET/ROXICET) 5-325 MG tablet, Take 1 tablet by mouth every 6 (six) hours as needed for severe pain., Disp: 120 tablet, Rfl: 0 .  [START ON 09/04/2017] oxyCODONE-acetaminophen (PERCOCET/ROXICET) 5-325 MG tablet, Take 1 tablet by mouth every 6 (six) hours as needed for severe pain., Disp: 120 tablet, Rfl: 0  ROS  Constitutional: Denies any fever or chills Gastrointestinal: No reported hemesis, hematochezia, vomiting, or acute GI distress Musculoskeletal: Denies any acute onset joint swelling, redness, loss of ROM, or weakness Neurological: No reported episodes of acute onset apraxia, aphasia, dysarthria, agnosia, amnesia, paralysis, loss of coordination, or loss of  consciousness  Allergies  Mr. Belsito is allergic to atorvastatin and varenicline.  PFSH  Drug: Mr. Lamb  has no drug history on file. Alcohol:  reports that he does not drink alcohol. Tobacco:  reports that he has been smoking Cigarettes.  He has a 51.00 pack-year smoking history. He has never used smokeless tobacco. Medical:  has a past medical history of Anxiety; COPD (chronic obstructive pulmonary disease) ([place]); Depression; Hernia of abdominal wall; History of alcoholism ([place]) (08/08/2015); History of pneumonectomy (08/08/2015); Hypertension; Macular degeneration (08/26/2016); and Tumor cells. Surgical: Mr. Hemann  has a past surgical history that includes Tonsillectomy; Lung removal, partial; and Neck surgery. Family: family history includes Alcohol abuse in his father; Kidney disease in his mother.  Constitutional Exam  General appearance: Well nourished, well developed, and well hydrated. In no apparent acute distress Vitals:   06/26/17 1033  BP: (!) 163/69  Pulse: 74  Resp: 18  Temp: (!) 97.2 F (36.2 C)  TempSrc: Oral  SpO2: 96%  Weight: 148 lb (67.1 kg)  Height: _0  (1.753 m)   BMI Assessment: Estimated body mass index is 21.86 kg/m as calculated from the following:   Height as of this encounter: _1  (1.753 m).   Weight as of this encounter: 148 lb (67.1 kg). Psych/Mental status: Alert, oriented x 3 (person, place, & time)       Eyes: PERLA Respiratory: No evidence of acute respiratory distress  Cervical Spine Area Exam  Skin & Axial Inspection: No masses, redness, edema, swelling, or associated skin lesions Alignment: Symmetrical Functional ROM: Unrestricted ROM      Stability: No instability detected Muscle Tone/Strength: Functionally intact. No obvious neuro-muscular anomalies detected. Sensory (Neurological): Unimpaired Palpation: No palpable anomalies              Upper Extremity (UE) Exam    Side: Right upper extremity  Side: Left upper extremity   Skin & Extremity Inspection: Skin color, temperature, and hair growth are WNL. No peripheral edema or cyanosis. No masses, redness, swelling, asymmetry, or associated skin lesions. No contractures.  Skin & Extremity Inspection: Skin color, temperature, and hair growth are WNL. No peripheral edema or cyanosis. No masses, redness, swelling, asymmetry, or associated skin lesions. No contractures.  Functional ROM: Unrestricted ROM          Functional ROM: Unrestricted ROM          Muscle Tone/Strength: Functionally intact. No obvious neuro-muscular anomalies detected.  Muscle Tone/Strength: Functionally intact. No obvious neuro-muscular anomalies detected.  Sensory (Neurological): Unimpaired          Sensory (Neurological): Unimpaired          Palpation: No palpable anomalies  Palpation: No palpable anomalies              Specialized Test(s): Deferred         Specialized Test(s): Deferred          Thoracic Spine Area Exam  Skin & Axial Inspection: No masses, redness, or swelling Alignment: Symmetrical Functional ROM: Unrestricted ROM Stability: No instability detected Muscle Tone/Strength: Functionally intact. No obvious neuro-muscular anomalies detected. Sensory (Neurological): Unimpaired Muscle strength & Tone: No palpable anomalies  Lumbar Spine Area Exam  Skin & Axial Inspection: No masses, redness, or swelling Alignment: Symmetrical Functional ROM: Unrestricted ROM      Stability: No instability detected Muscle Tone/Strength: Functionally intact. No obvious neuro-muscular anomalies detected. Sensory (Neurological): Unimpaired Palpation: Complains of area being tender to palpation       Provocative Tests: Lumbar Hyperextension and rotation test: Positive bilaterally for facet joint pain. Lumbar Lateral bending test: Positive ipsilateral radicular pain, bilaterally. Positive for bilateral foraminal stenosis. Patrick's Maneuver: evaluation deferred today                     Gait & Posture Assessment  Ambulation: Unassisted Gait: Relatively normal for age and body habitus Posture: WNL   Lower Extremity Exam    Side: Right lower extremity  Side: Left lower extremity  Skin & Extremity Inspection: Skin color, temperature, and hair growth are WNL. No peripheral edema or cyanosis. No masses, redness, swelling, asymmetry, or associated skin lesions. No contractures.  Skin & Extremity Inspection: Skin color, temperature, and hair growth are WNL. No peripheral edema or cyanosis. No masses, redness, swelling, asymmetry, or associated skin lesions. No contractures.  Functional ROM: Unrestricted ROM          Functional ROM: Unrestricted ROM          Muscle Tone/Strength: Functionally intact. No obvious neuro-muscular anomalies detected.  Muscle Tone/Strength: Functionally intact. No obvious neuro-muscular anomalies detected.  Sensory (Neurological): Unimpaired  Sensory (Neurological): Unimpaired  Palpation: No palpable anomalies  Palpation: No palpable anomalies   Assessment  Primary Diagnosis & Pertinent Problem List: The primary encounter diagnosis was Chronic lumbar radicular pain (L5/S1 dermatome) (Location of Secondary source of pain) (Right). Diagnoses of Lumbar spondylosis, Lumbar facet arthropathy (Bilateral) (R>L), Primary osteoarthritis involving multiple joints, Chronic pain syndrome, Neurogenic pain, Fibromyalgia, Neuropathic pain, Musculoskeletal pain, Opioid-induced constipation (OIC), and Long term current use of opiate analgesic were also pertinent to this visit.  Status Diagnosis  Controlled Controlled Controlled 1. Chronic lumbar radicular pain (L5/S1 dermatome) (Location of Secondary source of pain) (Right)   2. Lumbar spondylosis   3. Lumbar facet arthropathy (Bilateral) (R>L)   4. Primary osteoarthritis involving multiple joints   5. Chronic pain syndrome   6. Neurogenic pain   7. Fibromyalgia   8. Neuropathic pain   9. Musculoskeletal pain    10. Opioid-induced constipation (OIC)   11. Long term current use of opiate analgesic     Problems updated and reviewed during this visit: No problems updated. Plan of Care  Pharmacotherapy (Medications Ordered): Meds ordered this encounter  Medications  . oxyCODONE-acetaminophen (PERCOCET/ROXICET) 5-325 MG tablet    Sig: Take 1 tablet by mouth every 6 (six) hours as needed for severe pain.    Dispense:  120 tablet    Refill:  0    Do not place this medication, or any other prescription from our practice, on "Automatic Refill". Patient may have prescription filled one day early if pharmacy is closed on scheduled refill date. Do  not fill until:07/06/2017 To last until: 08/05/2017    Order Specific Question:   Supervising Provider    Answer:   Milinda Pointer [phone]  . oxyCODONE-acetaminophen (PERCOCET/ROXICET) 5-325 MG tablet    Sig: Take 1 tablet by mouth every 6 (six) hours as needed for severe pain.    Dispense:  120 tablet    Refill:  0    Do not place this medication, or any other prescription from our practice, on "Automatic Refill". Patient may have prescription filled one day early if pharmacy is closed on scheduled refill date. Do not fill until: 08/05/2017 To last until: 09/04/2017    Order Specific Question:   Supervising Provider    Answer:   Milinda Pointer [phone]  . oxyCODONE-acetaminophen (PERCOCET/ROXICET) 5-325 MG tablet    Sig: Take 1 tablet by mouth every 6 (six) hours as needed for severe pain.    Dispense:  120 tablet    Refill:  0    Do not place this medication, or any other prescription from our practice, on "Automatic Refill". Patient may have prescription filled one day early if pharmacy is closed on scheduled refill date. Do not fill until: 09/04/2017 To last until:10/04/2017    Order Specific Question:   Supervising Provider    Answer:   Milinda Pointer [phone]  . pregabalin (LYRICA) 150 MG capsule    Sig: Take 1 capsule (150 mg total) by  mouth 2 (two) times daily.    Dispense:  180 capsule    Refill:  0    Do not place this medication, or any other prescription from our practice, on "Automatic Refill". Patient may have prescription filled one day early if pharmacy is closed on scheduled refill date.    Order Specific Question:   Supervising Provider    Answer:   Milinda Pointer [phone]  . methocarbamol (ROBAXIN) 750 MG tablet    Sig: Take 1 tablet (750 mg total) by mouth every 8 (eight) hours as needed for muscle spasms.    Dispense:  90 tablet    Refill:  2    Do not place this medication, or any other prescription from our practice, on "Automatic Refill". Patient may have prescription filled one day early if pharmacy is closed on scheduled refill date.    Order Specific Question:   Supervising Provider    Answer:   Milinda Pointer [phone]  . ibuprofen (ADVIL,MOTRIN) 800 MG tablet    Sig: Take 1 tablet (800 mg total) by mouth every 8 (eight) hours as needed for moderate pain.    Dispense:  270 tablet    Refill:  0    Do not place this medication, or any other prescription from our practice, on "Automatic Refill". Patient may have prescription filled one day early if pharmacy is closed on scheduled refill date.    Order Specific Question:   Supervising Provider    Answer:   Milinda Pointer [phone]  . docusate sodium (COLACE) 100 MG capsule    Sig: Take 1 capsule (100 mg total) by mouth daily.    Dispense:  90 capsule    Refill:  0    Do not place this medication, or any other prescription from our practice, on "Automatic Refill". Patient may have prescription filled one day early if pharmacy is closed on scheduled refill date.    Order Specific Question:   Supervising Provider    Answer:   Milinda Pointer [[id number]]   New Prescriptions   No medications on file  Medications administered today: Mr. Mckamie had no medications administered during this visit. Lab-work, procedure(s), and/or  referral(s): Orders Placed This Encounter  Procedures  . ToxASSURE Select 13 (MW), Urine   Imaging and/or referral(s): None  Interventional therapies: Planned, scheduled, and/or pending:   Not at this time.   Considering:   Palliative bilateral lumbar facet block Palliative left-sided cervical epiduralsteroid injection  Palliative right-sided L5-S1 lumbar epiduralsteroid injection  Diagnostic bilateral cervical facet block Palliative Right-sided T8-9 thoracic epiduralsteroid injection    Palliative PRN treatment(s):   Palliativebilateral lumbar facet block Palliative left-sided cervical epidural steroid injection  Palliative right-sided L5-S1 lumbar epiduralsteroid injection    Provider-requested follow-up: Return in about 3 months (around 09/25/2017) for MedMgmt.  Future Appointments Date Time Provider [place]  09/22/2017 10:30 AM Vevelyn Francois, NP [facility] None   Primary Care Physician: [place] Location: [facility] Note by: Vevelyn Francois NP Date: 06/26/2017; Time: 12:08 PM  Pain Score Disclaimer: We use the NRS-11 scale. This is a self-reported, subjective measurement of pain severity with only modest accuracy. It is used primarily to identify changes within a particular patient. It must be understood that outpatient pain scales are significantly less accurate that those used for research, where they can be applied under ideal controlled circumstances with minimal exposure to variables. In reality, the score is likely to be a combination of pain intensity and pain affect, where pain affect describes the degree of emotional arousal or changes in action readiness caused by the sensory experience of pain. Factors such as social and work situation, setting, emotional state, anxiety levels, expectation, and prior pain experience may influence pain perception and show large inter-individual differences that may also be  affected by time variables.  Patient instructions provided during this appointment: Patient Instructions    ____________________________________________________________________________________________  Medication Rules  Applies to: All patients receiving prescriptions (written or electronic).  Pharmacy of record: Pharmacy where electronic prescriptions will be sent. If written prescriptions are taken to a different pharmacy, please inform the nursing staff. The pharmacy listed in the electronic medical record should be the one where you would like electronic prescriptions to be sent.  Prescription refills: Only during scheduled appointments. Applies to both, written and electronic prescriptions.  NOTE: The following applies primarily to controlled substances (Opioid* Pain Medications).   Patient's responsibilities: 1. Pain Pills: Bring all pain pills to every appointment (except for procedure appointments). 2. Pill Bottles: Bring pills in original pharmacy bottle. Always bring newest bottle. Bring bottle, even if empty. 3. Medication refills: You are responsible for knowing and keeping track of what medications you need refilled. The day before your appointment, write a list of all prescriptions that need to be refilled. Bring that list to your appointment and give it to the admitting nurse. Prescriptions will be written only during appointments. If you forget a medication, it will not be "Called in", "Faxed", or "electronically sent". You will need to get another appointment to get these prescribed. 4. Prescription Accuracy: You are responsible for carefully inspecting your prescriptions before leaving our office. Have the discharge nurse carefully go over each prescription with you, before taking them home. Make sure that your name is accurately spelled, that your address is correct. Check the name and dose of your medication to make sure it is accurate. Check the number of pills, and the  written instructions to make sure they are clear and accurate. Make sure that you are given enough medication to last until  your next medication refill appointment. 5. Taking Medication: Take medication as prescribed. Never take more pills than instructed. Never take medication more frequently than prescribed. Taking less pills or less frequently is permitted and encouraged, when it comes to controlled substances (written prescriptions).  6. Inform other Doctors: Always inform, all of your healthcare providers, of all the medications you take. 7. Pain Medication from other Providers: You are not allowed to accept any additional pain medication from any other Doctor or Healthcare provider. There are two exceptions to this rule. (see below) In the event that you require additional pain medication, you are responsible for notifying us, as stated below. 8. Medication Agreement: You are responsible for carefully reading and following our Medication Agreement. This must be signed before receiving any prescriptions from our practice. Safely store a copy of your signed Agreement. Violations to the Agreement will result in no further prescriptions. (Additional copies of our Medication Agreement are available upon request.) 9. Laws, Rules, & Regulations: All patients are expected to follow all Federal and [employer], Rules, [employer]. Ignorance of the Laws does not constitute a valid excuse. The use of any illegal substances is prohibited. 10. Adopted CDC guidelines & recommendations: Target dosing levels will be at or below 60 MME/day. Use of benzodiazepines** is not recommended.  Exceptions: There are only two exceptions to the rule of not receiving pain medications from other Healthcare Providers. 1. Exception #1 (Emergencies): In the event of an emergency (i.e.: accident requiring emergency care), you are allowed to receive additional pain medication. However, you are responsible for: As soon as you  are able, call our office [phone], at any time of the day or night, and leave a message stating your name, the date and nature of the emergency, and the name and dose of the medication prescribed. In the event that your call is answered by a member of our staff, make sure to document and save the date, time, and the name of the person that took your information.  2. Exception #2 (Planned Surgery): In the event that you are scheduled by another doctor or dentist to have any type of surgery or procedure, you are allowed (for a period no longer than 30 days), to receive additional pain medication, for the acute post-op pain. However, in this case, you are responsible for picking up a copy of our "Post-op Pain Management for Surgeons" handout, and giving it to your surgeon or dentist. This document is available at our office, and does not require an appointment to obtain it. Simply go to our office during business hours (Monday-Thursday from 8:00 AM to 4:00 PM) (Friday 8:00 AM to 12:00 Noon) or if you have a scheduled appointment with [place], prior to your surgery, and ask for it by name. In addition, you will need to provide [place] with your name, name of your surgeon, type of surgery, and date of procedure or surgery.  *Opioid medications include: morphine, codeine, oxycodone, oxymorphone, hydrocodone, hydromorphone, meperidine, tramadol, tapentadol, buprenorphine, fentanyl, methadone. **Benzodiazepine medications include: diazepam (Valium), alprazolam (Xanax), clonazepam (Klonopine), lorazepam (Ativan), clorazepate (Tranxene), chlordiazepoxide (Librium), estazolam (Prosom), oxazepam (Serax), temazepam (Restoril), triazolam (Halcion)  ____________________________________________________________________________________________  BMI interpretation table: BMI level Category Range association with higher incidence of chronic pain  <18 kg/m2 Underweight   18.5-24.9 kg/m2 Ideal body weight   25-29.9 kg/m2  Overweight Increased incidence by 20%  30-34.9 kg/m2 Obese (Class I) Increased incidence by 68%  35-39.9 kg/m2 Severe obesity (Class II) Increased incidence by 136%  >  40 kg/m2 Extreme obesity (Class III) Increased incidence by 254%   BMI Readings from Last 4 Encounters:  06/26/17 21.86 kg/m  06/03/17 22.89 kg/m  04/30/17 22.45 kg/m  03/27/17 22.74 kg/m   Wt Readings from Last 4 Encounters:  06/26/17 148 lb (67.1 kg)  06/03/17 155 lb (70.3 kg)  04/30/17 152 lb (68.9 kg)  03/27/17 154 lb (69.9 kg)

## 2017-07-03 LAB — TOXASSURE SELECT 13 (MW), URINE

## 2017-09-01 ENCOUNTER — Telehealth: Payer: Self-pay | Admitting: *Deleted

## 2017-09-01 NOTE — Telephone Encounter (Signed)
Patient has a prn order for LESI.  He does not get sedation. Please get approval and get scheduled.  Thank you.

## 2017-09-16 ENCOUNTER — Other Ambulatory Visit: Payer: Self-pay

## 2017-09-16 ENCOUNTER — Ambulatory Visit
Admission: RE | Admit: 2017-09-16 | Discharge: 2017-09-16 | Disposition: A | Discharge status: Home / Self Care | Payer: Medicare Other | Source: Ambulatory Visit | Attending: Pain Medicine | Admitting: Pain Medicine

## 2017-09-16 ENCOUNTER — Ambulatory Visit (HOSPITAL_BASED_OUTPATIENT_CLINIC_OR_DEPARTMENT_OTHER): Payer: Medicare Other | Admitting: Pain Medicine

## 2017-09-16 ENCOUNTER — Encounter: Payer: Self-pay | Admitting: Pain Medicine

## 2017-09-16 VITALS — BP 180/82 | HR 63 | Temp 98.5°F | Resp 16 | Ht 69.0 in | Wt 148.0 lb

## 2017-09-16 DIAGNOSIS — M4722 Other spondylosis with radiculopathy, cervical region: Secondary | ICD-10-CM | POA: Insufficient documentation

## 2017-09-16 DIAGNOSIS — M47816 Spondylosis without myelopathy or radiculopathy, lumbar region: Secondary | ICD-10-CM

## 2017-09-16 DIAGNOSIS — M5441 Lumbago with sciatica, right side: Secondary | ICD-10-CM

## 2017-09-16 DIAGNOSIS — M545 Low back pain, unspecified: Secondary | ICD-10-CM | POA: Insufficient documentation

## 2017-09-16 DIAGNOSIS — G8929 Other chronic pain: Secondary | ICD-10-CM | POA: Diagnosis not present

## 2017-09-16 DIAGNOSIS — M961 Postlaminectomy syndrome, not elsewhere classified: Secondary | ICD-10-CM

## 2017-09-16 DIAGNOSIS — M5416 Radiculopathy, lumbar region: Secondary | ICD-10-CM

## 2017-09-16 DIAGNOSIS — M47812 Spondylosis without myelopathy or radiculopathy, cervical region: Secondary | ICD-10-CM

## 2017-09-16 DIAGNOSIS — M5412 Radiculopathy, cervical region: Secondary | ICD-10-CM

## 2017-09-16 DIAGNOSIS — M4726 Other spondylosis with radiculopathy, lumbar region: Secondary | ICD-10-CM | POA: Diagnosis not present

## 2017-09-16 MED ORDER — METHYLPREDNISOLONE ACETATE 40 MG/ML IJ SUSP: 40.0000 mg | Freq: Once | INTRAMUSCULAR | Status: DC

## 2017-09-16 MED ORDER — LIDOCAINE HCL 2 % IJ SOLN
10.0000 mL | Freq: Once | INTRAMUSCULAR | Status: AC
  Administered 2017-09-16: 400 mg
  Filled 2017-09-16: qty 20

## 2017-09-16 MED ORDER — ROPIVACAINE HCL 2 MG/ML IJ SOLN
2.0000 mL | Freq: Once | INTRAMUSCULAR | Status: AC
  Administered 2017-09-16: 10 mL via EPIDURAL
  Filled 2017-09-16: qty 10

## 2017-09-16 MED ORDER — LIDOCAINE HCL 2 % IJ SOLN: 1.0000 mL | Freq: Once | INTRAMUSCULAR | Status: DC

## 2017-09-16 MED ORDER — TRIAMCINOLONE ACETONIDE 40 MG/ML IJ SUSP
40.0000 mg | Freq: Once | INTRAMUSCULAR | Status: AC
  Administered 2017-09-16: 40 mg
  Filled 2017-09-16: qty 1

## 2017-09-16 MED ORDER — SODIUM CHLORIDE 0.9% FLUSH
2.0000 mL | Freq: Once | INTRAVENOUS | Status: AC
  Administered 2017-09-16: 10 mL

## 2017-09-16 MED ORDER — METHYLPREDNISOLONE ACETATE 40 MG/ML IJ SUSP
INTRAMUSCULAR | Status: AC
  Filled 2017-09-16: qty 1

## 2017-09-16 MED ORDER — IOPAMIDOL (ISOVUE-M 200) INJECTION 41%
10.0000 mL | Freq: Once | INTRAMUSCULAR | Status: AC
  Administered 2017-09-16: 10 mL via EPIDURAL
  Filled 2017-09-16: qty 10

## 2017-09-16 NOTE — Patient Instructions (Signed)
____________________________________________________________________________________________  Post-Procedure instructions Instructions:  Apply ice: Fill a plastic sandwich bag with crushed ice. Cover it with a small towel and apply to injection site. Apply for 15 minutes then remove x 15 minutes. Repeat sequence on day of procedure, until you go to bed. The purpose is to minimize swelling and discomfort after procedure.  Apply heat: Apply heat to procedure site starting the day following the procedure. The purpose is to treat any soreness and discomfort from the procedure.  Food intake: Start with clear liquids (like water) and advance to regular food, as tolerated.   Physical activities: Keep activities to a minimum for the first 8 hours after the procedure.   Driving: If you have received any sedation, you are not allowed to drive for 24 hours after your procedure.  Blood thinner: Restart your blood thinner 6 hours after your procedure. (Only for those taking blood thinners)  Insulin: As soon as you can eat, you may resume your normal dosing schedule. (Only for those taking insulin)  Infection prevention: Keep procedure site clean and dry.  Post-procedure Pain Diary: Extremely important that this be done correctly and accurately. Recorded information will be used to determine the next step in treatment.  Pain evaluated is that of treated area only. Do not include pain from an untreated area.  Complete every hour, on the hour, for the initial 8 hours. Set an alarm to help you do this part accurately.  Do not go to sleep and have it completed later. It will not be accurate.  Follow-up appointment: Keep your follow-up appointment after the procedure. Usually 2 weeks for most procedures. (6 weeks in the case of radiofrequency.) Bring you pain diary.  Expect:  From numbing medicine (AKA: Local Anesthetics): Numbness or decrease in pain.  Onset: Full effect within 15 minutes of  injected.  Duration: It will depend on the type of local anesthetic used. On the average, 1 to 8 hours.   From steroids: Decrease in swelling or inflammation. Once inflammation is improved, relief of the pain will follow.  Onset of benefits: Depends on the amount of swelling present. The more swelling, the longer it will take for the benefits to be seen. In some cases, up to 10 days.  Duration: Steroids will stay in the system x 2 weeks. Duration of benefits will depend on multiple posibilities including persistent irritating factors.  From procedure: Some discomfort is to be expected once the numbing medicine wears off. This should be minimal if ice and heat are applied as instructed. Call if:  You experience numbness and weakness that gets worse with time, as opposed to wearing off.  New onset bowel or bladder incontinence. (Spinal procedures only)  Emergency Numbers:  [place] business hours (Monday - Thursday, 8:00 AM - 4:00 PM) (Friday, 9:00 AM - 12:00 Noon): [phone]  After hours: [phone] ____________________________________________________________________________________________

## 2017-09-16 NOTE — Progress Notes (Signed)
Patient's Name: Rick SWINGLER Sr.  MRN: [id number]  Referring Provider: [place], [place]  DOB: Aug 04, 1951  PCP: [facility], [place]  DOS: 09/16/2017  Note by: Gaspar Cola, MD  Service setting: Ambulatory outpatient  Specialty: Interventional Pain Management  Patient type: Established  Location: [facility] (AMB) Pain Management Facility  Visit type: Interventional Procedure   Primary Reason for Visit: Interventional Pain Management Treatment. CC: Back Pain (mid)  Procedure #1:  Anesthesia, Analgesia, Anxiolysis:  Type: Therapeutic Inter-Laminar Epidural Steroid Injection Region: Lumbar Level: L4-5 Level. Laterality: Right-Sided         Type: Local Anesthesia Local Anesthetic: Lidocaine 1% Route: Infiltration ([place]/IM) IV Access: Declined Sedation: Declined  Indication(s): Analgesia           Procedure #2:    Type: Trigger Point Injection Level: L2-3 spinous process Laterality: (Midline) Position: Prone Target: L2-3 interspinous ligament Approach: Posterior Region:   Lumbar area. Primary Purpose: Diagnostic     Indications: 1. Chronic lumbar radicular pain (L5/S1 dermatome) (Location of Secondary source of pain) (Right)   2. Chronic low back pain (Location of Primary Source of Pain) (midline)   3. Lumbar spondylosis   4. Chronic cervical radicular pain (Left)   5. Cervical spondylosis   6. Failed cervical surgery syndrome   7. Pain of lumbar spine (L2-3 interspinous ligament)    Pain Score: Pre-procedure: 5 /10 Post-procedure: 3 /10  Pre-op Assessment:  Rick Carter is a 66 y.o. (year old), male patient, seen today for interventional treatment. He  has a past surgical history that includes Tonsillectomy; Lung removal, partial; and Neck surgery. Rick Carter has a current medication list which includes the following prescription(s): aclidinium bromide, advair diskus, albuterol, aspirin ec, calcium carb-cholecalciferol, cholecalciferol, diphenhydramine, docusate sodium,  fluticasone-salmeterol, hydrochlorothiazide, hydroxyzine, ibuprofen, ipratropium-albuterol, metformin, methocarbamol, multiple vitamins-minerals, oxycodone-acetaminophen, pantoprazole, pregabalin, sodium chloride hypertonic, trazodone, tudorza pressair, umeclidinium bromide, loratadine, oxycodone-acetaminophen, oxycodone-acetaminophen, and sildenafil, and the following Facility-Administered Medications: lidocaine and methylprednisolone acetate. His primarily concern today is the Back Pain (mid)  Initial Vital Signs: There were no vitals taken for this visit. BMI: Estimated body mass index is 21.86 kg/m as calculated from the following:   Height as of this encounter: _0  (1.753 m).   Weight as of this encounter: 148 lb (67.1 kg).  Risk Assessment: Allergies: Reviewed. He is allergic to atorvastatin and varenicline.  Allergy Precautions: None required Coagulopathies: Reviewed. None identified.  Blood-thinner therapy: None at this time Active Infection(s): Reviewed. None identified. Rick Carter is afebrile  Site Confirmation: Rick Carter was asked to confirm the procedure and laterality before marking the site Procedure checklist: Completed Consent: Before the procedure and under the influence of no sedative(s), amnesic(s), or anxiolytics, the patient was informed of the treatment options, risks and possible complications. To fulfill our ethical and legal obligations, as recommended by the American Medical Association's Code of Ethics, I have informed the patient of my clinical impression; the nature and purpose of the treatment or procedure; the risks, benefits, and possible complications of the intervention; the alternatives, including doing nothing; the risk(s) and benefit(s) of the alternative treatment(s) or procedure(s); and the risk(s) and benefit(s) of doing nothing. The patient was provided information about the general risks and possible complications associated with the procedure. These may  include, but are not limited to: failure to achieve desired goals, infection, bleeding, organ or nerve damage, allergic reactions, paralysis, and death. In addition, the patient was informed of those risks and complications associated to Spine-related procedures, such as failure  to decrease pain; infection (i.e.: Meningitis, epidural or intraspinal abscess); bleeding (i.e.: epidural hematoma, subarachnoid hemorrhage, or any other type of intraspinal or peri-dural bleeding); organ or nerve damage (i.e.: Any type of peripheral nerve, nerve root, or spinal cord injury) with subsequent damage to sensory, motor, and/or autonomic systems, resulting in permanent pain, numbness, and/or weakness of one or several areas of the body; allergic reactions; (i.e.: anaphylactic reaction); and/or death. Furthermore, the patient was informed of those risks and complications associated with the medications. These include, but are not limited to: allergic reactions (i.e.: anaphylactic or anaphylactoid reaction(s)); adrenal axis suppression; blood sugar elevation that in diabetics may result in ketoacidosis or comma; water retention that in patients with history of congestive heart failure may result in shortness of breath, pulmonary edema, and decompensation with resultant heart failure; weight gain; swelling or edema; medication-induced neural toxicity; particulate matter embolism and blood vessel occlusion with resultant organ, and/or nervous system infarction; and/or aseptic necrosis of one or more joints. Finally, the patient was informed that Medicine is not an exact science; therefore, there is also the possibility of unforeseen or unpredictable risks and/or possible complications that may result in a catastrophic outcome. The patient indicated having understood very clearly. We have given the patient no guarantees and we have made no promises. Enough time was given to the patient to ask questions, all of which were answered  to the patient's satisfaction. Rick Carter has indicated that he wanted to continue with the procedure. Attestation: I, the ordering provider, attest that I have discussed with the patient the benefits, risks, side-effects, alternatives, likelihood of achieving goals, and potential problems during recovery for the procedure that I have provided informed consent. Date: 09/16/2017; Time: 7:43 AM  Pre-Procedure Preparation:  Monitoring: As per clinic protocol. Respiration, ETCO2, SpO2, BP, heart rate and rhythm monitor placed and checked for adequate function Safety Precautions: Patient was assessed for positional comfort and pressure points before starting the procedure. Time-out: I initiated and conducted the "Time-out" before starting the procedure, as per protocol. The patient was asked to participate by confirming the accuracy of the "Time Out" information. Verification of the correct person, site, and procedure were performed and confirmed by me, the nursing staff, and the patient. "Time-out" conducted as per Joint Commission's Universal Protocol (UP.01.01.01). "Time-out" Date & Time: 09/16/2017; 1337 hrs.  Description of Procedure #1 Process:   Position: Prone with head of the table was raised to facilitate breathing. Target Area: The interlaminar space, initially targeting the lower laminar border of the superior vertebral body. Approach: Paramedial approach. Area Prepped: Entire Posterior Lumbar Region Prepping solution: ChloraPrep (2% chlorhexidine gluconate and 70% isopropyl alcohol) Safety Precautions: Aspiration looking for blood return was conducted prior to all injections. At no point did we inject any substances, as a needle was being advanced. No attempts were made at seeking any paresthesias. Safe injection practices and needle disposal techniques used. Medications properly checked for expiration dates. SDV (single dose vial) medications used. Description of the Procedure: Protocol  guidelines were followed. The procedure needle was introduced through the skin, ipsilateral to the reported pain, and advanced to the target area. Bone was contacted and the needle walked caudad, until the lamina was cleared. The epidural space was identified using "loss-of-resistance technique" with 2-3 ml of PF-NaCl (0.9% NSS), in a 5cc LOR glass syringe. Vitals:   09/16/17 1241 09/16/17 1338 09/16/17 1343 09/16/17 1350  BP: (!) 162/72 (!) 162/95 (!) 168/81 (!) 180/82  Pulse: 63 69 63 63  Resp: _0 Temp: 98.5 F (36.9 C)     SpO2: 98% 99% 99% 97%  Weight: 148 lb (67.1 kg)     Height: _1  (1.753 m)       Start Time: 1337 hrs. Materials:  Needle(s) Type: Epidural needle Gauge: 17G Length: 3.5-in Medication(s): We administered iopamidol, triamcinolone acetonide, ropivacaine (PF) 2 mg/mL (0.2%), sodium chloride flush, and lidocaine. Please see chart orders for dosing details.  Description of Procedure #2 Process:   Prepping solution: ChloraPrep (2% chlorhexidine gluconate and 70% isopropyl alcohol) Safety Precautions: Aspiration looking for blood return was conducted prior to all injections. At no point did we inject any substances, as a needle was being advanced. No attempts were made at seeking any paresthesias. Safe injection practices and needle disposal techniques used. Medications properly checked for expiration dates. SDV (single dose vial) medications used. Description of the Procedure: Protocol guidelines were followed. The patient was placed in position over the fluoroscopy table. The target area was identified and the area prepped in the usual manner. Skin desensitized using vapocoolant spray. Skin & deeper tissues infiltrated with local anesthetic. Appropriate amount of time allowed to pass for local anesthetics to take effect. The procedure needles were then advanced to the target area. Proper needle placement secured. Negative aspiration confirmed. Solution injected in  intermittent fashion, asking for systemic symptoms every 0.5cc of injectate. The needles were then removed and the area cleansed, making sure to leave some of the prepping solution back to take advantage of its long term bactericidal properties.  End Time: 1339 hrs. Materials:  Needle(s) Type: regular needle Gauge: 22G Length: 1.5-in  Imaging Guidance for procedure #1 (Spinal):  Type of Imaging Technique: Fluoroscopy Guidance (Spinal) Indication(s): Assistance in needle guidance and placement for procedures requiring needle placement in or near specific anatomical locations not easily accessible without such assistance. Exposure Time: Please see nurses notes. Contrast: Before injecting any contrast, we confirmed that the patient did not have an allergy to iodine, shellfish, or radiological contrast. Once satisfactory needle placement was completed at the desired level, radiological contrast was injected. Contrast injected under live fluoroscopy. No contrast complications. See chart for type and volume of contrast used. Fluoroscopic Guidance: I was personally present during the use of fluoroscopy. "Tunnel Vision Technique" used to obtain the best possible view of the target area. Parallax error corrected before commencing the procedure. "Direction-depth-direction" technique used to introduce the needle under continuous pulsed fluoroscopy. Once target was reached, antero-posterior, oblique, and lateral fluoroscopic projection used confirm needle placement in all planes. Images permanently stored in EMR. Interpretation: I personally interpreted the imaging intraoperatively. Adequate needle placement confirmed in multiple planes. Appropriate spread of contrast into desired area was observed. No evidence of afferent or efferent intravascular uptake. No intrathecal or subarachnoid spread observed. Permanent images saved into the patient's record.  Antibiotic Prophylaxis:  Indication(s): None  identified Antibiotic given: None  Post-operative Assessment:  EBL: None Complications: No immediate post-treatment complications observed by team, or reported by patient. Note: The patient tolerated the entire procedure well. A repeat set of vitals were taken after the procedure and the patient was kept under observation following institutional policy, for this type of procedure. Post-procedural neurological assessment was performed, showing return to baseline, prior to discharge. The patient was provided with post-procedure discharge instructions, including a section on how to identify potential problems. Should any problems arise concerning this procedure, the patient was given instructions to immediately contact us, at any time, without hesitation.  In any case, we plan to contact the patient by telephone for a follow-up status report regarding this interventional procedure. Comments:  No additional relevant information.  Plan of Care   Possible POC:  Return for a palliative left-sided cervical epidural steroid injection under fluoroscopic guidance, no sedation    Imaging Orders     DG C-Arm 1-60 Min-No Report  Procedure Orders     Lumbar Epidural Injection     Cervical Epidural Injection     Injection tendon or ligament  Medications ordered for procedure: Meds ordered this encounter  Medications  . iopamidol (ISOVUE-M) 41 % intrathecal injection 10 mL  . triamcinolone acetonide (KENALOG-40) injection 40 mg  . ropivacaine (PF) 2 mg/mL (0.2%) (NAROPIN) injection 2 mL  . sodium chloride flush (NS) 0.9 % injection 2 mL  . lidocaine (XYLOCAINE) 2 % (with pres) injection 200 mg  . methylPREDNISolone acetate (DEPO-MEDROL) injection 40 mg  . lidocaine (XYLOCAINE) 2 % (with pres) injection 20 mg   Medications administered: We administered iopamidol, triamcinolone acetonide, ropivacaine (PF) 2 mg/mL (0.2%), sodium chloride flush, and lidocaine.  See the medical record for exact dosing,  route, and time of administration.  This SmartLink is deprecated. Use AVSMEDLIST instead to display the medication list for a patient. Disposition: Discharge home  Discharge Date & Time: 09/16/2017; 1355 hrs.   Physician-requested Follow-up: Return in about 2 weeks (around 09/30/2017) for Procedure (no sedation): (L) CESI. Future Appointments  Date Time Provider [place]  09/22/2017 10:30 AM Vevelyn Francois, NP [facility] None  10/02/2017 12:30 PM Milinda Pointer, MD [facility] None   Primary Care Physician: [facility], [place] Location: [facility] Outpatient Pain Management Facility Note by: Gaspar Cola, MD Date: 09/16/2017; Time: 2:03 PM  Disclaimer:  Medicine is not an Chief Strategy Officer. The only guarantee in medicine is that nothing is guaranteed. It is important to note that the decision to proceed with this intervention was based on the information collected from the patient. The Data and conclusions were drawn from the patient's questionnaire, the interview, and the physical examination. Because the information was provided in large part by the patient, it cannot be guaranteed that it has not been purposely or unconsciously manipulated. Every effort has been made to obtain as much relevant data as possible for this evaluation. It is important to note that the conclusions that lead to this procedure are derived in large part from the available data. Always take into account that the treatment will also be dependent on availability of resources and existing treatment guidelines, considered by other Pain Management Practitioners as being common knowledge and practice, at the time of the intervention. For Medico-Legal purposes, it is also important to point out that variation in procedural techniques and pharmacological choices are the acceptable norm. The indications, contraindications, technique, and results of the above procedure should only be interpreted and judged by a Board-Certified  Interventional Pain Specialist with extensive familiarity and expertise in the same exact procedure and technique.

## 2017-09-22 ENCOUNTER — Ambulatory Visit: Payer: Medicare Other | Admitting: Nurse Practitioner

## 2017-09-29 ENCOUNTER — Ambulatory Visit: Payer: Medicare Other | Attending: Nurse Practitioner | Admitting: Nurse Practitioner

## 2017-09-29 ENCOUNTER — Other Ambulatory Visit: Payer: Self-pay

## 2017-09-29 ENCOUNTER — Encounter: Payer: Self-pay | Admitting: Nurse Practitioner

## 2017-09-29 VITALS — BP 149/68 | HR 73 | Temp 98.1°F | Resp 18 | Ht 69.0 in | Wt 148.0 lb

## 2017-09-29 DIAGNOSIS — F419 Anxiety disorder, unspecified: Secondary | ICD-10-CM | POA: Insufficient documentation

## 2017-09-29 DIAGNOSIS — M4807 Spinal stenosis, lumbosacral region: Secondary | ICD-10-CM | POA: Insufficient documentation

## 2017-09-29 DIAGNOSIS — F1021 Alcohol dependence, in remission: Secondary | ICD-10-CM | POA: Insufficient documentation

## 2017-09-29 DIAGNOSIS — M5441 Lumbago with sciatica, right side: Secondary | ICD-10-CM

## 2017-09-29 DIAGNOSIS — Z79899 Other long term (current) drug therapy: Secondary | ICD-10-CM | POA: Insufficient documentation

## 2017-09-29 DIAGNOSIS — G629 Polyneuropathy, unspecified: Secondary | ICD-10-CM | POA: Diagnosis not present

## 2017-09-29 DIAGNOSIS — Z7984 Long term (current) use of oral hypoglycemic drugs: Secondary | ICD-10-CM | POA: Diagnosis not present

## 2017-09-29 DIAGNOSIS — Z841 Family history of disorders of kidney and ureter: Secondary | ICD-10-CM | POA: Diagnosis not present

## 2017-09-29 DIAGNOSIS — Z811 Family history of alcohol abuse and dependence: Secondary | ICD-10-CM | POA: Insufficient documentation

## 2017-09-29 DIAGNOSIS — M7918 Myalgia, other site: Secondary | ICD-10-CM | POA: Diagnosis not present

## 2017-09-29 DIAGNOSIS — I739 Peripheral vascular disease, unspecified: Secondary | ICD-10-CM | POA: Insufficient documentation

## 2017-09-29 DIAGNOSIS — M4722 Other spondylosis with radiculopathy, cervical region: Secondary | ICD-10-CM | POA: Diagnosis not present

## 2017-09-29 DIAGNOSIS — H3530 Unspecified macular degeneration: Secondary | ICD-10-CM | POA: Insufficient documentation

## 2017-09-29 DIAGNOSIS — I10 Essential (primary) hypertension: Secondary | ICD-10-CM | POA: Insufficient documentation

## 2017-09-29 DIAGNOSIS — M792 Neuralgia and neuritis, unspecified: Secondary | ICD-10-CM

## 2017-09-29 DIAGNOSIS — F17210 Nicotine dependence, cigarettes, uncomplicated: Secondary | ICD-10-CM | POA: Diagnosis not present

## 2017-09-29 DIAGNOSIS — Z888 Allergy status to other drugs, medicaments and biological substances status: Secondary | ICD-10-CM | POA: Diagnosis not present

## 2017-09-29 DIAGNOSIS — J449 Chronic obstructive pulmonary disease, unspecified: Secondary | ICD-10-CM | POA: Diagnosis not present

## 2017-09-29 DIAGNOSIS — E785 Hyperlipidemia, unspecified: Secondary | ICD-10-CM | POA: Insufficient documentation

## 2017-09-29 DIAGNOSIS — I4891 Unspecified atrial fibrillation: Secondary | ICD-10-CM | POA: Insufficient documentation

## 2017-09-29 DIAGNOSIS — M150 Primary generalized (osteo)arthritis: Secondary | ICD-10-CM

## 2017-09-29 DIAGNOSIS — I2510 Atherosclerotic heart disease of native coronary artery without angina pectoris: Secondary | ICD-10-CM | POA: Diagnosis not present

## 2017-09-29 DIAGNOSIS — K5903 Drug induced constipation: Secondary | ICD-10-CM | POA: Insufficient documentation

## 2017-09-29 DIAGNOSIS — F319 Bipolar disorder, unspecified: Secondary | ICD-10-CM | POA: Insufficient documentation

## 2017-09-29 DIAGNOSIS — M1991 Primary osteoarthritis, unspecified site: Secondary | ICD-10-CM | POA: Insufficient documentation

## 2017-09-29 DIAGNOSIS — M5010 Cervical disc disorder with radiculopathy, unspecified cervical region: Secondary | ICD-10-CM | POA: Insufficient documentation

## 2017-09-29 DIAGNOSIS — R209 Unspecified disturbances of skin sensation: Secondary | ICD-10-CM | POA: Insufficient documentation

## 2017-09-29 DIAGNOSIS — M47812 Spondylosis without myelopathy or radiculopathy, cervical region: Secondary | ICD-10-CM

## 2017-09-29 DIAGNOSIS — M47816 Spondylosis without myelopathy or radiculopathy, lumbar region: Secondary | ICD-10-CM | POA: Insufficient documentation

## 2017-09-29 DIAGNOSIS — G894 Chronic pain syndrome: Secondary | ICD-10-CM | POA: Diagnosis not present

## 2017-09-29 DIAGNOSIS — M545 Low back pain: Secondary | ICD-10-CM | POA: Diagnosis not present

## 2017-09-29 DIAGNOSIS — M961 Postlaminectomy syndrome, not elsewhere classified: Secondary | ICD-10-CM | POA: Diagnosis not present

## 2017-09-29 DIAGNOSIS — M5412 Radiculopathy, cervical region: Secondary | ICD-10-CM

## 2017-09-29 DIAGNOSIS — M159 Polyosteoarthritis, unspecified: Secondary | ICD-10-CM

## 2017-09-29 DIAGNOSIS — M542 Cervicalgia: Secondary | ICD-10-CM | POA: Diagnosis present

## 2017-09-29 DIAGNOSIS — Z79891 Long term (current) use of opiate analgesic: Secondary | ICD-10-CM | POA: Insufficient documentation

## 2017-09-29 DIAGNOSIS — T402X5A Adverse effect of other opioids, initial encounter: Secondary | ICD-10-CM | POA: Insufficient documentation

## 2017-09-29 DIAGNOSIS — K219 Gastro-esophageal reflux disease without esophagitis: Secondary | ICD-10-CM | POA: Insufficient documentation

## 2017-09-29 DIAGNOSIS — G8929 Other chronic pain: Secondary | ICD-10-CM

## 2017-09-29 DIAGNOSIS — M48061 Spinal stenosis, lumbar region without neurogenic claudication: Secondary | ICD-10-CM | POA: Insufficient documentation

## 2017-09-29 DIAGNOSIS — Z5181 Encounter for therapeutic drug level monitoring: Secondary | ICD-10-CM | POA: Insufficient documentation

## 2017-09-29 DIAGNOSIS — Z7951 Long term (current) use of inhaled steroids: Secondary | ICD-10-CM | POA: Diagnosis not present

## 2017-09-29 DIAGNOSIS — Z902 Acquired absence of lung [part of]: Secondary | ICD-10-CM | POA: Insufficient documentation

## 2017-09-29 DIAGNOSIS — Z7982 Long term (current) use of aspirin: Secondary | ICD-10-CM | POA: Insufficient documentation

## 2017-09-29 MED ORDER — OXYCODONE-ACETAMINOPHEN 5-325 MG PO TABS: 1.0000 | ORAL_TABLET | Freq: Four times a day (QID) | ORAL | 0 refills | Status: DC | PRN

## 2017-09-29 MED ORDER — PREGABALIN 150 MG PO CAPS: 150.0000 mg | ORAL_CAPSULE | Freq: Two times a day (BID) | ORAL | 0 refills | Status: DC

## 2017-09-29 MED ORDER — METHOCARBAMOL 750 MG PO TABS: 750.0000 mg | ORAL_TABLET | Freq: Three times a day (TID) | ORAL | 2 refills | Status: DC | PRN

## 2017-09-29 MED ORDER — IBUPROFEN 800 MG PO TABS: 800.0000 mg | ORAL_TABLET | Freq: Three times a day (TID) | ORAL | 0 refills | Status: DC | PRN

## 2017-09-29 NOTE — Progress Notes (Signed)
Patient's Name: Rick RABORN Sr.  MRN: [id number]  Referring Provider: [place]  DOB: 02-20-1951  PCP: [place]  DOS: 09/29/2017  Note by: Vevelyn Francois NP  Service setting: Ambulatory outpatient  Specialty: Interventional Pain Management  Location: [facility] (AMB) Pain Management Facility    Patient type: Established    Primary Reason(s) for Visit: Encounter for prescription drug management & post-procedure evaluation of chronic illness with mild to moderate exacerbation(Level of risk: moderate) CC: Neck Pain  HPI  Rick Carter is a 66 y.o. year old, male patient, who comes today for a post-procedure evaluation and medication management. 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[place]); Atrial fibrillation ([place]); Bipolar affective disorder ([place]); Chronic obstructive pulmonary disease ([place]); Degeneration of intervertebral disc of cervical region; Colon, diverticulosis; Acid reflux; Essential (primary) hypertension; HLD (hyperlipidemia); Breath shortness; Compulsive tobacco user syndrome; Diverticular disease of large intestine; Post-thoracotomy pain syndrome; Encounter for therapeutic drug level monitoring; Long term current use of opiate analgesic; Long term prescription opiate use; Uncomplicated opioid dependence ([place]); Opiate use (30 MME/Day); Fibromyalgia; Chronic pain syndrome; Lumbar facet syndrome (Location of Primary Source of Pain) (Bilateral) (R>L); History of alcoholism ([place]); Chronic low back pain (Location of Primary Source of Pain) (midline); Lumbar spondylosis; Cervical spondylosis; Failed cervical surgery syndrome; Nicotine dependence; Tobacco abuse; Substance Use Disorder Risk: High; History of right-sided partial pneumonectomy; Atherosclerotic peripheral vascular disease ([place]); Musculoskeletal pain; Neurogenic pain; Neuropathic pain; Opioid-induced constipation (OIC); Osteoarthritis; NSAID induced gastritis; Thoracic spine pain; Thoracic radicular pain (Right);  Chronic lumbar radicular pain (L5/S1 dermatome) (Location of Secondary source of pain) (Right); Lumbar foraminal stenosis (L5-S1) (Right); Lumbar facet arthropathy (Bilateral) (R>L); Lumbar disc protrusion (L3-4) (Right); Chronic cervical radicular pain (Left); Tumor of parotid gland; Trigger finger, left ring finger; Disturbance of skin sensation; and Pain of lumbar spine (L2-3 interspinous ligament) on their problem list. His primarily concern today is the Neck Pain  Pain Assessment: Location:   Neck Radiating: left arm Onset: More than a month ago Duration: Chronic pain Quality: Nagging, Sharp Severity: 4 /10 (self-reported pain score)  Note: Reported level is compatible with observation.                          Effect on ADL:   Timing: Constant Modifying factors: heating pad  Rick Carter was last seen on 09/16/2017 for a procedure. During today's appointment we reviewed Rick Carter's post-procedure results, as well as his outpatient medication regimen. He amdits that he is having some numbness and tingling.  Further details on both, my assessment(s), as well as the proposed treatment plan, please see below.  Controlled Substance Pharmacotherapy Assessment REMS (Risk Evaluation and Mitigation Strategy)  Analgesic:Oxycodone/APAP 5/325 one every 6 hours (20 mg/day) MME/day:30 mg/day  Landis Martins, RN  09/29/2017 12:30 PM  Sign at close encounter Nursing Pain Medication Assessment:  Safety precautions to be maintained throughout the outpatient stay will include: orient to surroundings, keep bed in low position, maintain call bell within reach at all times, provide assistance with transfer out of bed and ambulation.  Medication Inspection Compliance: Pill count conducted under aseptic conditions, in front of the patient. Neither the pills nor the bottle was removed from the patient's sight at any time. Once count was completed pills were immediately returned to the patient in their  original bottle.  Medication: Oxycodone/APAP Pill/Patch Count: 21 of 120 pills remain Pill/Patch Appearance: Markings consistent with prescribed medication Bottle Appearance: Standard pharmacy container.  Clearly labeled. Filled Date: 11/22 / 2018 Last Medication intake:  Today   Pharmacokinetics: Liberation and absorption (onset of action): WNL Distribution (time to peak effect): WNL Metabolism and excretion (duration of action): WNL         Pharmacodynamics: Desired effects: Analgesia: Rick Carter reports >50% benefit. Functional ability: Patient reports that medication allows him to accomplish basic ADLs Clinically meaningful improvement in function (CMIF): Sustained CMIF goals met Perceived effectiveness: Described as relatively effective, allowing for increase in activities of daily living (ADL) Undesirable effects: Side-effects or Adverse reactions: None reported Monitoring: [place] PMP: Online review of the past 25-monthperiod conducted. Compliant with practice rules and regulations Last UDS on record: Summary  Date Value Ref Range Status  06/26/2017 FINAL  Final    Comment:    ==================================================================== TOXASSURE SELECT 13 (MW) ==================================================================== Test                             Result       Flag       Units Drug Present and Declared for Prescription Verification   Oxycodone                      678          EXPECTED   ng/mg creat   Oxymorphone                    2414         EXPECTED   ng/mg creat   Noroxycodone                   1869         EXPECTED   ng/mg creat   Noroxymorphone                 668          EXPECTED   ng/mg creat    Sources of oxycodone are scheduled prescription medications.    Oxymorphone, noroxycodone, and noroxymorphone are expected    metabolites of oxycodone. Oxymorphone is also available as a    scheduled prescription  medication. ==================================================================== Test                      Result    Flag   Units      Ref Range   Creatinine              59               mg/dL      >=20 ==================================================================== Declared Medications:  The flagging and interpretation on this report are based on the  following declared medications.  Unexpected results may arise from  inaccuracies in the declared medications.  **Note: The testing scope of this panel includes these medications:  Oxycodone (Percocet)  Oxycodone (Roxicet)  **Note: The testing scope of this panel does not include following  reported medications:  Acetaminophen (Percocet)  Acetaminophen (Roxicet)  Aclidinium  Albuterol  Albuterol (Duoneb)  Aspirin (Aspirin 81)  Calcium Carbonate (Calcium Carb)  Cholecalciferol  Diphenhydramine (Benadryl)  Docusate (Colace)  Fluticasone (Advair)  Hydrochlorothiazide (Microzide)  Hydroxyzine  Ibuprofen  Ipratropium (Duoneb)  Loratadine (Claritin)  Metformin  Methocarbamol (Robaxin)  Multivitamin (MVI)  Pantoprazole (Protonix)  Pregabalin (Lyrica)  Salmeterol (Advair)  Sildenafil (Viagra)  Sodium Chloride  Trazodone  Umeclidinium (Incruse Ellipta)  Vitamin D ==================================================================== For clinical consultation, please call [phone]. ====================================================================    UDS interpretation: Compliant          Medication Assessment Form: Reviewed. Patient indicates being compliant with therapy Treatment compliance: Compliant Risk Assessment Profile: Aberrant behavior: See prior evaluations. None observed or detected today Comorbid factors increasing risk of overdose: See prior notes. No additional risks detected today Risk of substance use disorder (SUD): Low Opioid Risk Tool - 09/16/17 1258      Family History of Substance Abuse    Alcohol  Positive Male    Illegal Drugs  Negative    Rx Drugs  Negative      Personal History of Substance Abuse   Alcohol  Positive Male or Male    Illegal Drugs  Negative    Rx Drugs  Negative      Age   Age between 79-45 years   No      History of Preadolescent Sexual Abuse   History of Preadolescent Sexual Abuse  Negative or Male      Psychological Disease   Psychological Disease  Negative    Depression  Positive      Total Score   Opioid Risk Tool Scoring  7    Opioid Risk Interpretation  Moderate Risk      ORT Scoring interpretation table:  Score <3 = Low Risk for SUD  Score between 4-7 = Moderate Risk for SUD  Score >8 = High Risk for Opioid Abuse   Risk Mitigation Strategies:  Patient Counseling: Covered Patient-Prescriber Agreement (PPA): Present and active  Notification to other healthcare providers: Done  Pharmacologic Plan: No change in therapy, at this time  Post-Procedure Assessment  09/16/2017 Procedure: Right LESI Pre-procedure pain score:  5/10 Post-procedure pain score: 3/10         Influential Factors: BMI: 21.86 kg/m Intra-procedural challenges: None observed.         Assessment challenges: None detected.              Reported side-effects: None.        Post-procedural adverse reactions or complications: None reported         Sedation: Please see nurses note. When no sedatives are used, the analgesic levels obtained are directly associated to the effectiveness of the local anesthetics. However, when sedation is provided, the level of analgesia obtained during the initial 1 hour following the intervention, is believed to be the result of a combination of factors. These factors may include, but are not limited to: 1. The effectiveness of the local anesthetics used. 2. The effects of the analgesic(s) and/or anxiolytic(s) used. 3. The degree of discomfort experienced by the patient at the time of the procedure. 4. The patients ability and  reliability in recalling and recording the events. 5. The presence and influence of possible secondary gains and/or psychosocial factors. Reported result: Relief experienced during the 1st hour after the procedure: 100 % (Ultra-Short Term Relief)            Interpretative annotation: Clinically appropriate result. Analgesia during this period is likely to be Local Anesthetic and/or IV Sedative (Analgesic/Anxiolytic) related.          Effects of local anesthetic: The analgesic effects attained during this period are directly associated to the localized infiltration of local anesthetics and therefore cary significant diagnostic value as to the etiological location, or anatomical origin, of the pain. Expected duration of relief is directly dependent on the pharmacodynamics of the local anesthetic used. Long-acting (4-6 hours) anesthetics used.  Reported result: Relief  during the next 4 to 6 hour after the procedure: 50 % (Short-Term Relief)            Interpretative annotation: Clinically appropriate result. Analgesia during this period is likely to be Local Anesthetic-related.          Long-term benefit: Defined as the period of time past the expected duration of local anesthetics (1 hour for short-acting and 4-6 hours for long-acting). With the possible exception of prolonged sympathetic blockade from the local anesthetics, benefits during this period are typically attributed to, or associated with, other factors such as analgesic sensory neuropraxia, antiinflammatory effects, or beneficial biochemical changes provided by agents other than the local anesthetics.  Reported result: Extended relief following procedure: 70 % (Long-Term Relief)            Interpretative annotation: Clinically appropriate result. Good relief. No permanent benefit expected. Inflammation plays a part in the etiology to the pain.          Current benefits: Defined as reported results that persistent at this point in time.    Analgesia: >50 %            Function: Somewhat improved ROM: Somewhat improved Interpretative annotation: Ongoing benefit.  Effective therapeutic approach.          Interpretation: Results would suggest a successful intervention.                  Plan:  Please see "Plan of Care" for details.        Laboratory Chemistry  Inflammation Markers (CRP: Acute Phase) (ESR: Chronic Phase) Lab Results  Component Value Date   CRP <0.8 01/06/2017   ESRSEDRATE 5 01/06/2017                 Rheumatology Markers No results found for: Elayne Guerin, [facility]              Renal Function Markers Lab Results  Component Value Date   BUN 12 01/06/2017   CREATININE 0.79 01/06/2017   GFRAA >60 01/06/2017   GFRNONAA >60 01/06/2017                 Hepatic Function Markers Lab Results  Component Value Date   AST 30 01/06/2017   ALT 24 01/06/2017   ALBUMIN 4.2 01/06/2017   ALKPHOS 53 01/06/2017                 Electrolytes Lab Results  Component Value Date   NA 142 01/06/2017   K 4.0 01/06/2017   CL 107 01/06/2017   CALCIUM 9.7 01/06/2017   MG 2.2 01/06/2017                 Neuropathy Markers Lab Results  Component Value Date   VITAMINB12 637 01/06/2017                 Bone Pathology Markers Lab Results  Component Value Date   25OHVITD1 58 01/06/2017   25OHVITD2 <1.0 01/06/2017   25OHVITD3 58 01/06/2017                 Coagulation Parameters No results found for: INR, LABPROT, APTT, PLT, DDIMER               Cardiovascular Markers No results found for: BNP, CKTOTAL, CKMB, TROPONINI, HGB, HCT               CA Markers No results found for: CEA, CA125, LABCA2  Note: Lab results reviewed.  Recent Diagnostic Imaging Results  DG C-Arm 1-60 Min-No Report Fluoroscopy was utilized by the requesting physician.  No radiographic  interpretation.   Complexity Note: Imaging results reviewed. Results shared with Rick Carter, using  [place]'s terms.                         Meds   Current Outpatient Medications:  .  Aclidinium Bromide (TUDORZA PRESSAIR IN), Inhale 40 mcg into the lungs 2 (two) times daily., Disp: , Rfl:  .  ADVAIR DISKUS 250-50 MCG/DOSE AEPB, INHALE 1 PUFF 2 (TWO) TIMES A DAY., Disp: , Rfl: 11 .  albuterol (PROVENTIL) (2.5 MG/3ML) 0.083% nebulizer solution, INHALE 0.5 ML (2.5 MG TOTAL) BY NEBULIZATION EVERY SIX (6) HOURS AS NEEDED FOR WHEEZING., Disp: , Rfl: 11 .  aspirin EC 81 MG tablet, Take 81 mg by mouth daily., Disp: , Rfl:  .  Calcium Carb-Cholecalciferol 600-800 MG-UNIT TABS, TAKE 1 TABLET BY MOUTH 2 TIMES A DAY, Disp: , Rfl:  .  cholecalciferol (VITAMIN D) 1000 UNITS tablet, Take 2,000 Units by mouth 2 (two) times daily. , Disp: , Rfl:  .  diphenhydrAMINE (BENADRYL) 25 mg capsule, Take 25 mg by mouth 2 (two) times daily. , Disp: , Rfl:  .  docusate sodium (COLACE) 100 MG capsule, Take 1 capsule (100 mg total) by mouth daily., Disp: 90 capsule, Rfl: 0 .  Fluticasone-Salmeterol (ADVAIR) 500-50 MCG/DOSE AEPB, Inhale 1 puff into the lungs 2 (two) times daily., Disp: , Rfl:  .  hydrochlorothiazide (MICROZIDE) 12.5 MG capsule, TAKE ONE CAPSULE BY MOUTH FOR HIGH BLOOD PRESSURE, Disp: , Rfl: 3 .  hydrOXYzine (ATARAX/VISTARIL) 25 MG tablet, Take 25 mg by mouth as needed., Disp: , Rfl:  .  [START ON 10/04/2017] ibuprofen (ADVIL,MOTRIN) 800 MG tablet, Take 1 tablet (800 mg total) by mouth every 8 (eight) hours as needed for moderate pain., Disp: 270 tablet, Rfl: 0 .  ipratropium-albuterol (DUONEB) 0.5-2.5 (3) MG/3ML SOLN, Take 3 mLs by nebulization every 6 (six) hours as needed., Disp: , Rfl:  .  loratadine (CLARITIN) 10 MG tablet, Take 10 mg by mouth. , Disp: , Rfl:  .  metFORMIN (GLUCOPHAGE) 500 MG tablet, Take 500 mg by mouth 2 (two) times daily with a meal., Disp: , Rfl:  .  [START ON 10/04/2017] methocarbamol (ROBAXIN) 750 MG tablet, Take 1 tablet (750 mg total) by mouth every 8 (eight) hours as needed for  muscle spasms., Disp: 90 tablet, Rfl: 2 .  Multiple Vitamins-Minerals (PRESERVISION AREDS 2 PO), Take 1 tablet by mouth 2 (two) times daily., Disp: , Rfl:  .  [START ON 12/03/2017] oxyCODONE-acetaminophen (PERCOCET/ROXICET) 5-325 MG tablet, Take 1 tablet by mouth every 6 (six) hours as needed for severe pain., Disp: 120 tablet, Rfl: 0 .  pantoprazole (PROTONIX) 40 MG tablet, Take 40 mg by mouth., Disp: , Rfl:  .  [START ON 10/04/2017] pregabalin (LYRICA) 150 MG capsule, Take 1 capsule (150 mg total) by mouth 2 (two) times daily., Disp: 180 capsule, Rfl: 0 .  sildenafil (VIAGRA) 100 MG tablet, 100 mg., Disp: , Rfl:  .  sodium chloride HYPERTONIC 3 % nebulizer solution, INHALE 4 ML BY NEBULIZATION TWICE A DAY, Disp: , Rfl: 6 .  traZODone (DESYREL) 50 MG tablet, Take 50 mg by mouth at bedtime. Reported on 02/20/2016, Disp: , Rfl:  .  TUDORZA PRESSAIR 400 MCG/ACT AEPB, Inhale 1 puff into the lungs 2 (two) times daily., Disp: , Rfl:  11 .  umeclidinium bromide (INCRUSE ELLIPTA) 62.5 MCG/INH AEPB, Inhale into the lungs., Disp: , Rfl:  .  [START ON 11/03/2017] oxyCODONE-acetaminophen (PERCOCET/ROXICET) 5-325 MG tablet, Take 1 tablet by mouth every 6 (six) hours as needed for severe pain., Disp: 120 tablet, Rfl: 0 .  [START ON 10/04/2017] oxyCODONE-acetaminophen (PERCOCET/ROXICET) 5-325 MG tablet, Take 1 tablet by mouth every 6 (six) hours as needed for severe pain., Disp: 120 tablet, Rfl: 0  ROS  Constitutional: Denies any fever or chills Gastrointestinal: No reported hemesis, hematochezia, vomiting, or acute GI distress Musculoskeletal: Denies any acute onset joint swelling, redness, loss of ROM, or weakness Neurological: No reported episodes of acute onset apraxia, aphasia, dysarthria, agnosia, amnesia, paralysis, loss of coordination, or loss of consciousness  Allergies  Rick Carter is allergic to atorvastatin and varenicline.  PFSH  Drug: Rick Carter  has no drug history on file. Alcohol:  reports that  he does not drink alcohol. Tobacco:  reports that he has been smoking cigarettes.  He has a 51.00 pack-year smoking history. he has never used smokeless tobacco. Medical:  has a past medical history of Anxiety, COPD (chronic obstructive pulmonary disease) ([place]), Depression, Hernia of abdominal wall, History of alcoholism ([place]) (08/08/2015), History of pneumonectomy (08/08/2015), Hypertension, Macular degeneration (08/26/2016), and Tumor cells. Surgical: Mr. Appenzeller  has a past surgical history that includes Tonsillectomy; Lung removal, partial; and Neck surgery. Family: family history includes Alcohol abuse in his father; Kidney disease in his mother.  Constitutional Exam  General appearance: Well nourished, well developed, and well hydrated. In no apparent acute distress Vitals:   09/29/17 1221  BP: (!) 149/68  Pulse: 73  Resp: 18  Temp: 98.1 F (36.7 C)  TempSrc: Oral  SpO2: 98%  Weight: 148 lb (67.1 kg)  Height: 5' 9" (1.753 m)   BMI Assessment: Estimated body mass index is 21.86 kg/m as calculated from the following:   Height as of this encounter: 5' 9" (1.753 m).   Weight as of this encounter: 148 lb (67.1 kg). Psych/Mental status: Alert, oriented x 3 (person, place, & time)       Eyes: PERLA Respiratory: No evidence of acute respiratory distress  Cervical Spine Area Exam  Skin & Axial Inspection: No masses, redness, edema, swelling, or associated skin lesions Alignment: Symmetrical Functional ROM: Unrestricted ROM      Stability: No instability detected Muscle Tone/Strength: Functionally intact. No obvious neuro-muscular anomalies detected. Sensory (Neurological): Unimpaired Palpation: Complains of area being tender to palpation              Upper Extremity (UE) Exam    Side: Right upper extremity  Side: Left upper extremity  Skin & Extremity Inspection: Skin color, temperature, and hair growth are WNL. No peripheral edema or cyanosis. No masses, redness, swelling,  asymmetry, or associated skin lesions. No contractures.  Skin & Extremity Inspection: Skin color, temperature, and hair growth are WNL. No peripheral edema or cyanosis. No masses, redness, swelling, asymmetry, or associated skin lesions. No contractures.  Functional ROM: Unrestricted ROM          Functional ROM: Unrestricted ROM          Muscle Tone/Strength: Functionally intact. No obvious neuro-muscular anomalies detected.  Muscle Tone/Strength: Functionally intact. No obvious neuro-muscular anomalies detected.  Sensory (Neurological): Unimpaired          Sensory (Neurological): Unimpaired          Palpation: No palpable anomalies  Palpation: No palpable anomalies              Specialized Test(s): Deferred         Specialized Test(s): Deferred          Thoracic Spine Area Exam  Skin & Axial Inspection: No masses, redness, or swelling Alignment: Symmetrical Functional ROM: Unrestricted ROM Stability: No instability detected Muscle Tone/Strength: Functionally intact. No obvious neuro-muscular anomalies detected. Sensory (Neurological): Unimpaired Muscle strength & Tone: No palpable anomalies  Lumbar Spine Area Exam  Skin & Axial Inspection: No masses, redness, or swelling Alignment: Symmetrical Functional ROM: Unrestricted ROM      Stability: No instability detected Muscle Tone/Strength: Functionally intact. No obvious neuro-muscular anomalies detected. Sensory (Neurological): Unimpaired Palpation: No palpable anomalies       Provocative Tests: Lumbar Hyperextension and rotation test: evaluation deferred today       Lumbar Lateral bending test: evaluation deferred today       Carter's Maneuver: evaluation deferred today                    Gait & Posture Assessment  Ambulation: Unassisted Gait: Relatively normal for age and body habitus Posture: WNL   Lower Extremity Exam    Side: Right lower extremity  Side: Left lower extremity  Skin & Extremity Inspection: Skin  color, temperature, and hair growth are WNL. No peripheral edema or cyanosis. No masses, redness, swelling, asymmetry, or associated skin lesions. No contractures.  Skin & Extremity Inspection: Skin color, temperature, and hair growth are WNL. No peripheral edema or cyanosis. No masses, redness, swelling, asymmetry, or associated skin lesions. No contractures.  Functional ROM: Unrestricted ROM          Functional ROM: Unrestricted ROM          Muscle Tone/Strength: Functionally intact. No obvious neuro-muscular anomalies detected.  Muscle Tone/Strength: Functionally intact. No obvious neuro-muscular anomalies detected.  Sensory (Neurological): Unimpaired  Sensory (Neurological): Unimpaired  Palpation: No palpable anomalies  Palpation: No palpable anomalies   Assessment  Primary Diagnosis & Pertinent Problem List: The primary encounter diagnosis was Chronic low back pain (Location of Primary Source of Pain) (midline). Diagnoses of Chronic cervical radicular pain (Left), Cervical spondylosis, Neurogenic pain, Chronic pain syndrome, Neuropathic pain, Musculoskeletal pain, Primary osteoarthritis involving multiple joints, and Opioid-induced constipation (OIC) were also pertinent to this visit.  Status Diagnosis  Controlled Worsening Worsening 1. Chronic low back pain (Location of Primary Source of Pain) (midline)   2. Chronic cervical radicular pain (Left)   3. Cervical spondylosis   4. Neurogenic pain   5. Chronic pain syndrome   6. Neuropathic pain   7. Musculoskeletal pain   8. Primary osteoarthritis involving multiple joints   9. Opioid-induced constipation (OIC)     Problems updated and reviewed during this visit: No problems updated. Plan of Care  Pharmacotherapy (Medications Ordered): Meds ordered this encounter  Medications  . methocarbamol (ROBAXIN) 750 MG tablet    Sig: Take 1 tablet (750 mg total) by mouth every 8 (eight) hours as needed for muscle spasms.    Dispense:  90  tablet    Refill:  2    Do not place this medication, or any other prescription from our practice, on "Automatic Refill". Patient may have prescription filled one day early if pharmacy is closed on scheduled refill date.    Order Specific Question:   Supervising Provider    Answer:   Milinda Pointer [phone]  . ibuprofen (ADVIL,MOTRIN) 800 MG tablet  Sig: Take 1 tablet (800 mg total) by mouth every 8 (eight) hours as needed for moderate pain.    Dispense:  270 tablet    Refill:  0    Do not place this medication, or any other prescription from our practice, on "Automatic Refill". Patient may have prescription filled one day early if pharmacy is closed on scheduled refill date.    Order Specific Question:   Supervising Provider    Answer:   Milinda Pointer [phone]  . oxyCODONE-acetaminophen (PERCOCET/ROXICET) 5-325 MG tablet    Sig: Take 1 tablet by mouth every 6 (six) hours as needed for severe pain.    Dispense:  120 tablet    Refill:  0    Do not place this medication, or any other prescription from our practice, on "Automatic Refill". Patient may have prescription filled one day early if pharmacy is closed on scheduled refill date. Do not fill until: 12/03/2017 To last until:01/02/2018    Order Specific Question:   Supervising Provider    Answer:   Milinda Pointer [phone]  . oxyCODONE-acetaminophen (PERCOCET/ROXICET) 5-325 MG tablet    Sig: Take 1 tablet by mouth every 6 (six) hours as needed for severe pain.    Dispense:  120 tablet    Refill:  0    Do not place this medication, or any other prescription from our practice, on "Automatic Refill". Patient may have prescription filled one day early if pharmacy is closed on scheduled refill date. Do not fill until:11/03/2017 To last until: 12/03/2017    Order Specific Question:   Supervising Provider    Answer:   Milinda Pointer [phone]  . oxyCODONE-acetaminophen (PERCOCET/ROXICET) 5-325 MG tablet    Sig: Take 1 tablet by  mouth every 6 (six) hours as needed for severe pain.    Dispense:  120 tablet    Refill:  0    Do not place this medication, or any other prescription from our practice, on "Automatic Refill". Patient may have prescription filled one day early if pharmacy is closed on scheduled refill date. Do not fill until: 10/04/2017 To last until: 11/03/2017    Order Specific Question:   Supervising Provider    Answer:   Milinda Pointer [phone]  . pregabalin (LYRICA) 150 MG capsule    Sig: Take 1 capsule (150 mg total) by mouth 2 (two) times daily.    Dispense:  180 capsule    Refill:  0    Do not place this medication, or any other prescription from our practice, on "Automatic Refill". Patient may have prescription filled one day early if pharmacy is closed on scheduled refill date.    Order Specific Question:   Supervising Provider    Answer:   Milinda Pointer [[id number]]  This SmartLink is deprecated. Use AVSMEDLIST instead to display the medication list for a patient. Medications administered today: Rick Carter Sr. had no medications administered during this visit. Lab-work, procedure(s), and/or referral(s): No orders of the defined types were placed in this encounter.  Imaging and/or referral(s): None  Interventional therapies: Planned, scheduled, and/or pending:  L-CESI with sedation .   Considering:  Palliative bilateral lumbar facet block Palliative left-sided cervical epiduralsteroid injection  Palliative right-sided L5-S1 lumbar epiduralsteroid injection  Diagnostic bilateral cervical facet block Palliative Right-sided T8-9 thoracic epiduralsteroid injection    Palliative PRN treatment(s):  Palliativebilateral lumbar facet block Palliative left-sided cervical epidural steroid injection  Palliative right-sided L5-S1 lumbar epiduralsteroid injection       Provider-requested follow-up:  Return in about 3 months (around 12/28/2017) for MedMgmt with Me  Dionisio David), .  Future Appointments  Date Time Provider [place]  10/21/2017 10:15 AM Milinda Pointer, MD [facility] None  12/24/2017 11:30 AM Vevelyn Francois, NP [facility] None   Primary Care Physician: [facility] [place] Location: [facility] Facility Note by: Vevelyn Francois NP Date: 09/29/2017; Time: 8:29 AM  Pain Score Disclaimer: We use the NRS-11 scale. This is a self-reported, subjective measurement of pain severity with only modest accuracy. It is used primarily to identify changes within a particular patient. It must be understood that outpatient pain scales are significantly less accurate that those used for research, where they can be applied under ideal controlled circumstances with minimal exposure to variables. In reality, the score is likely to be a combination of pain intensity and pain affect, where pain affect describes the degree of emotional arousal or changes in action readiness caused by the sensory experience of pain. Factors such as social and work situation, setting, emotional state, anxiety levels, expectation, and prior pain experience may influence pain perception and show large inter-individual differences that may also be affected by time variables.  Patient instructions provided during this appointment: Patient Instructions   ____________________________________________________________________________________________  Medication Rules  Applies to: All patients receiving prescriptions (written or electronic).  Pharmacy of record: Pharmacy where electronic prescriptions will be sent. If written prescriptions are taken to a different pharmacy, please inform the nursing staff. The pharmacy listed in the electronic medical record should be the one where you would like electronic prescriptions to be sent.  Prescription refills: Only during scheduled appointments. Applies to both, written and electronic prescriptions.  NOTE: The  following applies primarily to controlled substances (Opioid* Pain Medications).   Patient's responsibilities: 1. Pain Pills: Bring all pain pills to every appointment (except for procedure appointments). 2. Pill Bottles: Bring pills in original pharmacy bottle. Always bring newest bottle. Bring bottle, even if empty. 3. Medication refills: You are responsible for knowing and keeping track of what medications you need refilled. The day before your appointment, write a list of all prescriptions that need to be refilled. Bring that list to your appointment and give it to the admitting nurse. Prescriptions will be written only during appointments. If you forget a medication, it will not be "Called in", "Faxed", or "electronically sent". You will need to get another appointment to get these prescribed. 4. Prescription Accuracy: You are responsible for carefully inspecting your prescriptions before leaving our office. Have the discharge nurse carefully go over each prescription with you, before taking them home. Make sure that your name is accurately spelled, that your address is correct. Check the name and dose of your medication to make sure it is accurate. Check the number of pills, and the written instructions to make sure they are clear and accurate. Make sure that you are given enough medication to last until your next medication refill appointment. 5. Taking Medication: Take medication as prescribed. Never take more pills than instructed. Never take medication more frequently than prescribed. Taking less pills or less frequently is permitted and encouraged, when it comes to controlled substances (written prescriptions).  6. Inform other Doctors: Always inform, all of your healthcare providers, of all the medications you take. 7. Pain Medication from other Providers: You are not allowed to accept any additional pain medication from any other Doctor or Healthcare provider. There are two exceptions to this  rule. (see below) In the event that you  require additional pain medication, you are responsible for notifying us, as stated below. 8. Medication Agreement: You are responsible for carefully reading and following our Medication Agreement. This must be signed before receiving any prescriptions from our practice. Safely store a copy of your signed Agreement. Violations to the Agreement will result in no further prescriptions. (Additional copies of our Medication Agreement are available upon request.) 9. Laws, Rules, & Regulations: All patients are expected to follow all Federal and [employer], [employer], Rules, [employer]. Ignorance of the Laws does not constitute a valid excuse. The use of any illegal substances is prohibited. 10. Adopted CDC guidelines & recommendations: Target dosing levels will be at or below 60 MME/day. Use of benzodiazepines** is not recommended.  Exceptions: There are only two exceptions to the rule of not receiving pain medications from other Healthcare Providers. 1. Exception #1 (Emergencies): In the event of an emergency (i.e.: accident requiring emergency care), you are allowed to receive additional pain medication. However, you are responsible for: As soon as you are able, call our office [phone], at any time of the day or night, and leave a message stating your name, the date and nature of the emergency, and the name and dose of the medication prescribed. In the event that your call is answered by a member of our staff, make sure to document and save the date, time, and the name of the person that took your information.  2. Exception #2 (Planned Surgery): In the event that you are scheduled by another doctor or dentist to have any type of surgery or procedure, you are allowed (for a period no longer than 30 days), to receive additional pain medication, for the acute post-op pain. However, in this case, you are responsible for picking up a copy of our "Post-op Pain  Management for Surgeons" handout, and giving it to your surgeon or dentist. This document is available at our office, and does not require an appointment to obtain it. Simply go to our office during business hours (Monday-Thursday from 8:00 AM to 4:00 PM) (Friday 8:00 AM to 12:00 Noon) or if you have a scheduled appointment with [place], prior to your surgery, and ask for it by name. In addition, you will need to provide [place] with your name, name of your surgeon, type of surgery, and date of procedure or surgery.  *Opioid medications include: morphine, codeine, oxycodone, oxymorphone, hydrocodone, hydromorphone, meperidine, tramadol, tapentadol, buprenorphine, fentanyl, methadone. **Benzodiazepine medications include: diazepam (Valium), alprazolam (Xanax), clonazepam (Klonopine), lorazepam (Ativan), clorazepate (Tranxene), chlordiazepoxide (Librium), estazolam (Prosom), oxazepam (Serax), temazepam (Restoril), triazolam (Halcion)  ____________________________________________________________________________________________   ____________________________________________________________________________________________  Medication Rules  Applies to: All patients receiving prescriptions (written or electronic).  Pharmacy of record: Pharmacy where electronic prescriptions will be sent. If written prescriptions are taken to a different pharmacy, please inform the nursing staff. The pharmacy listed in the electronic medical record should be the one where you would like electronic prescriptions to be sent.  Prescription refills: Only during scheduled appointments. Applies to both, written and electronic prescriptions.  NOTE: The following applies primarily to controlled substances (Opioid* Pain Medications).   Patient's responsibilities: 11. Pain Pills: Bring all pain pills to every appointment (except for procedure appointments). 12. Pill Bottles: Bring pills in original pharmacy bottle. Always bring newest  bottle. Bring bottle, even if empty. 13. Medication refills: You are responsible for knowing and keeping track of what medications you need refilled. The day before your appointment, write a list of all prescriptions that need  to be refilled. Bring that list to your appointment and give it to the admitting nurse. Prescriptions will be written only during appointments. If you forget a medication, it will not be "Called in", "Faxed", or "electronically sent". You will need to get another appointment to get these prescribed. 14. Prescription Accuracy: You are responsible for carefully inspecting your prescriptions before leaving our office. Have the discharge nurse carefully go over each prescription with you, before taking them home. Make sure that your name is accurately spelled, that your address is correct. Check the name and dose of your medication to make sure it is accurate. Check the number of pills, and the written instructions to make sure they are clear and accurate. Make sure that you are given enough medication to last until your next medication refill appointment. 15. Taking Medication: Take medication as prescribed. Never take more pills than instructed. Never take medication more frequently than prescribed. Taking less pills or less frequently is permitted and encouraged, when it comes to controlled substances (written prescriptions).  16. Inform other Doctors: Always inform, all of your healthcare providers, of all the medications you take. 17. Pain Medication from other Providers: You are not allowed to accept any additional pain medication from any other Doctor or Healthcare provider. There are two exceptions to this rule. (see below) In the event that you require additional pain medication, you are responsible for notifying us, as stated below. 18. Medication Agreement: You are responsible for carefully reading and following our Medication Agreement. This must be signed before receiving any  prescriptions from our practice. Safely store a copy of your signed Agreement. Violations to the Agreement will result in no further prescriptions. (Additional copies of our Medication Agreement are available upon request.) 19. Laws, Rules, & Regulations: All patients are expected to follow all Federal and [employer], [employer], Rules, [employer]. Ignorance of the Laws does not constitute a valid excuse. The use of any illegal substances is prohibited. 20. Adopted CDC guidelines & recommendations: Target dosing levels will be at or below 60 MME/day. Use of benzodiazepines** is not recommended.  Exceptions: There are only two exceptions to the rule of not receiving pain medications from other Healthcare Providers. 3. Exception #1 (Emergencies): In the event of an emergency (i.e.: accident requiring emergency care), you are allowed to receive additional pain medication. However, you are responsible for: As soon as you are able, call our office [phone], at any time of the day or night, and leave a message stating your name, the date and nature of the emergency, and the name and dose of the medication prescribed. In the event that your call is answered by a member of our staff, make sure to document and save the date, time, and the name of the person that took your information.  4. Exception #2 (Planned Surgery): In the event that you are scheduled by another doctor or dentist to have any type of surgery or procedure, you are allowed (for a period no longer than 30 days), to receive additional pain medication, for the acute post-op pain. However, in this case, you are responsible for picking up a copy of our "Post-op Pain Management for Surgeons" handout, and giving it to your surgeon or dentist. This document is available at our office, and does not require an appointment to obtain it. Simply go to our office during business hours (Monday-Thursday from 8:00 AM to 4:00 PM) (Friday 8:00 AM to 12:00 Noon)  or if you have a scheduled  appointment with [place], prior to your surgery, and ask for it by name. In addition, you will need to provide [place] with your name, name of your surgeon, type of surgery, and date of procedure or surgery.  *Opioid medications include: morphine, codeine, oxycodone, oxymorphone, hydrocodone, hydromorphone, meperidine, tramadol, tapentadol, buprenorphine, fentanyl, methadone. **Benzodiazepine medications include: diazepam (Valium), alprazolam (Xanax), clonazepam (Klonopine), lorazepam (Ativan), clorazepate (Tranxene), chlordiazepoxide (Librium), estazolam (Prosom), oxazepam (Serax), temazepam (Restoril), triazolam (Halcion)  ____________________________________________________________________________________________

## 2017-09-29 NOTE — Progress Notes (Signed)
Nursing Pain Medication Assessment:  Safety precautions to be maintained throughout the outpatient stay will include: orient to surroundings, keep bed in low position, maintain call bell within reach at all times, provide assistance with transfer out of bed and ambulation.  Medication Inspection Compliance: Pill count conducted under aseptic conditions, in front of the patient. Neither the pills nor the bottle was removed from the patient's sight at any time. Once count was completed pills were immediately returned to the patient in their original bottle.  Medication: Oxycodone/APAP Pill/Patch Count: 21 of 120 pills remain Pill/Patch Appearance: Markings consistent with prescribed medication Bottle Appearance: Standard pharmacy container. Clearly labeled. Filled Date: 11/22 / 2018 Last Medication intake:  Today

## 2017-09-29 NOTE — Patient Instructions (Signed)
____________________________________________________________________________________________  Medication Rules  Applies to: All patients receiving prescriptions (written or electronic).  Pharmacy of record: Pharmacy where electronic prescriptions will be sent. If written prescriptions are taken to a different pharmacy, please inform the nursing staff. The pharmacy listed in the electronic medical record should be the one where you would like electronic prescriptions to be sent.  Prescription refills: Only during scheduled appointments. Applies to both, written and electronic prescriptions.  NOTE: The following applies primarily to controlled substances (Opioid* Pain Medications).   Patient's responsibilities: 1. Pain Pills: Bring all pain pills to every appointment (except for procedure appointments). 2. Pill Bottles: Bring pills in original pharmacy bottle. Always bring newest bottle. Bring bottle, even if empty. 3. Medication refills: You are responsible for knowing and keeping track of what medications you need refilled. The day before your appointment, write a list of all prescriptions that need to be refilled. Bring that list to your appointment and give it to the admitting nurse. Prescriptions will be written only during appointments. If you forget a medication, it will not be "Called in", "Faxed", or "electronically sent". You will need to get another appointment to get these prescribed. 4. Prescription Accuracy: You are responsible for carefully inspecting your prescriptions before leaving our office. Have the discharge nurse carefully go over each prescription with you, before taking them home. Make sure that your name is accurately spelled, that your address is correct. Check the name and dose of your medication to make sure it is accurate. Check the number of pills, and the written instructions to make sure they are clear and accurate. Make sure that you are given enough medication to  last until your next medication refill appointment. 5. Taking Medication: Take medication as prescribed. Never take more pills than instructed. Never take medication more frequently than prescribed. Taking less pills or less frequently is permitted and encouraged, when it comes to controlled substances (written prescriptions).  6. Inform other Doctors: Always inform, all of your healthcare providers, of all the medications you take. 7. Pain Medication from other Providers: You are not allowed to accept any additional pain medication from any other Doctor or Healthcare provider. There are two exceptions to this rule. (see below) In the event that you require additional pain medication, you are responsible for notifying us, as stated below. 8. Medication Agreement: You are responsible for carefully reading and following our Medication Agreement. This must be signed before receiving any prescriptions from our practice. Safely store a copy of your signed Agreement. Violations to the Agreement will result in no further prescriptions. (Additional copies of our Medication Agreement are available upon request.) 9. Laws, Rules, & Regulations: All patients are expected to follow all Federal and [employer], [employer], Rules, [employer]. Ignorance of the Laws does not constitute a valid excuse. The use of any illegal substances is prohibited. 10. Adopted CDC guidelines & recommendations: Target dosing levels will be at or below 60 MME/day. Use of benzodiazepines** is not recommended.  Exceptions: There are only two exceptions to the rule of not receiving pain medications from other Healthcare Providers. 1. Exception #1 (Emergencies): In the event of an emergency (i.e.: accident requiring emergency care), you are allowed to receive additional pain medication. However, you are responsible for: As soon as you are able, call our office [phone], at any time of the day or night, and leave a message stating your  name, the date and nature of the emergency, and the name and dose of the medication  prescribed. In the event that your call is answered by a member of our staff, make sure to document and save the date, time, and the name of the person that took your information.  2. Exception #2 (Planned Surgery): In the event that you are scheduled by another doctor or dentist to have any type of surgery or procedure, you are allowed (for a period no longer than 30 days), to receive additional pain medication, for the acute post-op pain. However, in this case, you are responsible for picking up a copy of our "Post-op Pain Management for Surgeons" handout, and giving it to your surgeon or dentist. This document is available at our office, and does not require an appointment to obtain it. Simply go to our office during business hours (Monday-Thursday from 8:00 AM to 4:00 PM) (Friday 8:00 AM to 12:00 Noon) or if you have a scheduled appointment with [place], prior to your surgery, and ask for it by name. In addition, you will need to provide [place] with your name, name of your surgeon, type of surgery, and date of procedure or surgery.  *Opioid medications include: morphine, codeine, oxycodone, oxymorphone, hydrocodone, hydromorphone, meperidine, tramadol, tapentadol, buprenorphine, fentanyl, methadone. **Benzodiazepine medications include: diazepam (Valium), alprazolam (Xanax), clonazepam (Klonopine), lorazepam (Ativan), clorazepate (Tranxene), chlordiazepoxide (Librium), estazolam (Prosom), oxazepam (Serax), temazepam (Restoril), triazolam (Halcion)  ____________________________________________________________________________________________   ____________________________________________________________________________________________  Medication Rules  Applies to: All patients receiving prescriptions (written or electronic).  Pharmacy of record: Pharmacy where electronic prescriptions will be sent. If written prescriptions  are taken to a different pharmacy, please inform the nursing staff. The pharmacy listed in the electronic medical record should be the one where you would like electronic prescriptions to be sent.  Prescription refills: Only during scheduled appointments. Applies to both, written and electronic prescriptions.  NOTE: The following applies primarily to controlled substances (Opioid* Pain Medications).   Patient's responsibilities: 11. Pain Pills: Bring all pain pills to every appointment (except for procedure appointments). 12. Pill Bottles: Bring pills in original pharmacy bottle. Always bring newest bottle. Bring bottle, even if empty. 13. Medication refills: You are responsible for knowing and keeping track of what medications you need refilled. The day before your appointment, write a list of all prescriptions that need to be refilled. Bring that list to your appointment and give it to the admitting nurse. Prescriptions will be written only during appointments. If you forget a medication, it will not be "Called in", "Faxed", or "electronically sent". You will need to get another appointment to get these prescribed. 14. Prescription Accuracy: You are responsible for carefully inspecting your prescriptions before leaving our office. Have the discharge nurse carefully go over each prescription with you, before taking them home. Make sure that your name is accurately spelled, that your address is correct. Check the name and dose of your medication to make sure it is accurate. Check the number of pills, and the written instructions to make sure they are clear and accurate. Make sure that you are given enough medication to last until your next medication refill appointment. 15. Taking Medication: Take medication as prescribed. Never take more pills than instructed. Never take medication more frequently than prescribed. Taking less pills or less frequently is permitted and encouraged, when it comes to  controlled substances (written prescriptions).  16. Inform other Doctors: Always inform, all of your healthcare providers, of all the medications you take. 17. Pain Medication from other Providers: You are not allowed to accept any additional pain medication from any other  Doctor or Healthcare provider. There are two exceptions to this rule. (see below) In the event that you require additional pain medication, you are responsible for notifying us, as stated below. 18. Medication Agreement: You are responsible for carefully reading and following our Medication Agreement. This must be signed before receiving any prescriptions from our practice. Safely store a copy of your signed Agreement. Violations to the Agreement will result in no further prescriptions. (Additional copies of our Medication Agreement are available upon request.) 19. Laws, Rules, & Regulations: All patients are expected to follow all Federal and [employer], [employer], Rules, [employer]. Ignorance of the Laws does not constitute a valid excuse. The use of any illegal substances is prohibited. 20. Adopted CDC guidelines & recommendations: Target dosing levels will be at or below 60 MME/day. Use of benzodiazepines** is not recommended.  Exceptions: There are only two exceptions to the rule of not receiving pain medications from other Healthcare Providers. 3. Exception #1 (Emergencies): In the event of an emergency (i.e.: accident requiring emergency care), you are allowed to receive additional pain medication. However, you are responsible for: As soon as you are able, call our office [phone], at any time of the day or night, and leave a message stating your name, the date and nature of the emergency, and the name and dose of the medication prescribed. In the event that your call is answered by a member of our staff, make sure to document and save the date, time, and the name of the person that took your information.  4. Exception #2  (Planned Surgery): In the event that you are scheduled by another doctor or dentist to have any type of surgery or procedure, you are allowed (for a period no longer than 30 days), to receive additional pain medication, for the acute post-op pain. However, in this case, you are responsible for picking up a copy of our "Post-op Pain Management for Surgeons" handout, and giving it to your surgeon or dentist. This document is available at our office, and does not require an appointment to obtain it. Simply go to our office during business hours (Monday-Thursday from 8:00 AM to 4:00 PM) (Friday 8:00 AM to 12:00 Noon) or if you have a scheduled appointment with [place], prior to your surgery, and ask for it by name. In addition, you will need to provide [place] with your name, name of your surgeon, type of surgery, and date of procedure or surgery.  *Opioid medications include: morphine, codeine, oxycodone, oxymorphone, hydrocodone, hydromorphone, meperidine, tramadol, tapentadol, buprenorphine, fentanyl, methadone. **Benzodiazepine medications include: diazepam (Valium), alprazolam (Xanax), clonazepam (Klonopine), lorazepam (Ativan), clorazepate (Tranxene), chlordiazepoxide (Librium), estazolam (Prosom), oxazepam (Serax), temazepam (Restoril), triazolam (Halcion)  ____________________________________________________________________________________________

## 2017-10-02 ENCOUNTER — Ambulatory Visit: Payer: Medicare Other | Admitting: Pain Medicine

## 2017-10-21 ENCOUNTER — Ambulatory Visit: Payer: Medicare Other | Admitting: Pain Medicine

## 2017-10-21 NOTE — Progress Notes (Deleted)
Patient's Name: Rick CHARTERS Sr.  MRN: [id number]  Referring Provider: [facility], [place]  DOB: [birth year]  PCP: [facility], [place]  DOS: 10/21/2017  Note by: Gaspar Cola, MD  Service setting: Ambulatory outpatient  Specialty: Interventional Pain Management  Patient type: Established  Location: [facility] (AMB) Pain Management Facility  Visit type: Interventional Procedure   Primary Reason for Visit: Interventional Pain Management Treatment. CC: No chief complaint on file.  Procedure:  Anesthesia, Analgesia, Anxiolysis:  Type: Diagnostic, Inter-Laminar, Epidural Steroid Injection Region: Posterior Cervico-thoracic Region Level: C7-T1 Laterality: Left-Sided Paramedial  Type: Local Anesthesia Local Anesthetic: Lidocaine 1% Route: Infiltration ([place]/IM) IV Access: Declined Sedation: Declined  Indication(s): Analgesia           Indications: 1. Chronic cervical radicular pain (Left)   2. Failed cervical surgery syndrome   3. Cervical spondylosis    Pain Score: Pre-procedure:  /10 Post-procedure:  /10  Pre-op Assessment:  Rick Carter is a 67 y.o. (year old), male patient, seen today for interventional treatment. He  has a past surgical history that includes Tonsillectomy; Lung removal, partial; and Neck surgery. Rick Carter has a current medication list which includes the following prescription(s): aclidinium bromide, advair diskus, albuterol, aspirin ec, calcium carb-cholecalciferol, cholecalciferol, diphenhydramine, docusate sodium, fluticasone-salmeterol, hydrochlorothiazide, hydroxyzine, ibuprofen, ipratropium-albuterol, loratadine, metformin, methocarbamol, multiple vitamins-minerals, oxycodone-acetaminophen, oxycodone-acetaminophen, oxycodone-acetaminophen, pantoprazole, pregabalin, sildenafil, sodium chloride hypertonic, trazodone, tudorza pressair, and umeclidinium bromide. His primarily concern today is the No chief complaint on file.  Initial Vital Signs: There were no vitals taken  for this visit. BMI: Estimated body mass index is 21.86 kg/m as calculated from the following:   Height as of 09/29/17: _0  (1.753 m).   Weight as of 09/29/17: 148 lb (67.1 kg).  Risk Assessment: Allergies: Reviewed. He is allergic to atorvastatin and varenicline.  Allergy Precautions: None required Coagulopathies: Reviewed. None identified.  Blood-thinner therapy: None at this time Active Infection(s): Reviewed. None identified. Rick Carter is afebrile  Site Confirmation: Rick Carter was asked to confirm the procedure and laterality before marking the site Procedure checklist: Completed Consent: Before the procedure and under the influence of no sedative(s), amnesic(s), or anxiolytics, the patient was informed of the treatment options, risks and possible complications. To fulfill our ethical and legal obligations, as recommended by the American Medical Association's Code of Ethics, I have informed the patient of my clinical impression; the nature and purpose of the treatment or procedure; the risks, benefits, and possible complications of the intervention; the alternatives, including doing nothing; the risk(s) and benefit(s) of the alternative treatment(s) or procedure(s); and the risk(s) and benefit(s) of doing nothing. The patient was provided information about the general risks and possible complications associated with the procedure. These may include, but are not limited to: failure to achieve desired goals, infection, bleeding, organ or nerve damage, allergic reactions, paralysis, and death. In addition, the patient was informed of those risks and complications associated to Spine-related procedures, such as failure to decrease pain; infection (i.e.: Meningitis, epidural or intraspinal abscess); bleeding (i.e.: epidural hematoma, subarachnoid hemorrhage, or any other type of intraspinal or peri-dural bleeding); organ or nerve damage (i.e.: Any type of peripheral nerve, nerve root, or spinal  cord injury) with subsequent damage to sensory, motor, and/or autonomic systems, resulting in permanent pain, numbness, and/or weakness of one or several areas of the body; allergic reactions; (i.e.: anaphylactic reaction); and/or death. Furthermore, the patient was informed of those risks and complications associated with the medications. These include, but are not limited to:  allergic reactions (i.e.: anaphylactic or anaphylactoid reaction(s)); adrenal axis suppression; blood sugar elevation that in diabetics may result in ketoacidosis or comma; water retention that in patients with history of congestive heart failure may result in shortness of breath, pulmonary edema, and decompensation with resultant heart failure; weight gain; swelling or edema; medication-induced neural toxicity; particulate matter embolism and blood vessel occlusion with resultant organ, and/or nervous system infarction; and/or aseptic necrosis of one or more joints. Finally, the patient was informed that Medicine is not an exact science; therefore, there is also the possibility of unforeseen or unpredictable risks and/or possible complications that may result in a catastrophic outcome. The patient indicated having understood very clearly. We have given the patient no guarantees and we have made no promises. Enough time was given to the patient to ask questions, all of which were answered to the patient's satisfaction. Rick Carter has indicated that he wanted to continue with the procedure. Attestation: I, the ordering provider, attest that I have discussed with the patient the benefits, risks, side-effects, alternatives, likelihood of achieving goals, and potential problems during recovery for the procedure that I have provided informed consent. Date: 10/21/2017; Time: 8:16 AM  Pre-Procedure Preparation:  Monitoring: As per clinic protocol. Respiration, ETCO2, SpO2, BP, heart rate and rhythm monitor placed and checked for adequate  function Safety Precautions: Patient was assessed for positional comfort and pressure points before starting the procedure. Time-out: I initiated and conducted the "Time-out" before starting the procedure, as per protocol. The patient was asked to participate by confirming the accuracy of the "Time Out" information. Verification of the correct person, site, and procedure were performed and confirmed by me, the nursing staff, and the patient. "Time-out" conducted as per Joint Commission's Universal Protocol (UP.01.01.01). "Time-out" Date & Time: 10/21/2017;   hrs.  Description of Procedure Process:   Position: Prone with head of the table was raised to facilitate breathing. Target Area: For Epidural Steroid injections the target is the interlaminar space, initially targeting the lower border of the superior vertebral body lamina. Approach: Paramedial approach. Area Prepped: Entire PosteriorCervical Region Prepping solution: ChloraPrep (2% chlorhexidine gluconate and 70% isopropyl alcohol) Safety Precautions: Aspiration looking for blood return was conducted prior to all injections. At no point did we inject any substances, as a needle was being advanced. No attempts were made at seeking any paresthesias. Safe injection practices and needle disposal techniques used. Medications properly checked for expiration dates. SDV (single dose vial) medications used. Description of the Procedure: Protocol guidelines were followed. The procedure needle was introduced through the skin, ipsilateral to the reported pain, and advanced to the target area. Bone was contacted and the needle walked caudad, until the lamina was cleared. The epidural space was identified using "loss-of-resistance technique" with 2-3 ml of PF-NaCl (0.9% NSS), in a 5cc LOR glass syringe. There were no vitals filed for this visit.  Start Time:   hrs. End Time:   hrs. Materials:  Needle(s) Type: Epidural needle Gauge: 17G Length:  3.5-in Medication(s): Sr. Pat Patrick Sr. had no medications administered during this visit. Please see chart orders for dosing details.  Imaging Guidance (Spinal):  Type of Imaging Technique: Fluoroscopy Guidance (Spinal) Indication(s): Assistance in needle guidance and placement for procedures requiring needle placement in or near specific anatomical locations not easily accessible without such assistance. Exposure Time: Please see nurses notes. Contrast: Before injecting any contrast, we confirmed that the patient did not have an allergy to iodine, shellfish, or radiological contrast. Once satisfactory needle  placement was completed at the desired level, radiological contrast was injected. Contrast injected under live fluoroscopy. No contrast complications. See chart for type and volume of contrast used. Fluoroscopic Guidance: I was personally present during the use of fluoroscopy. "Tunnel Vision Technique" used to obtain the best possible view of the target area. Parallax error corrected before commencing the procedure. "Direction-depth-direction" technique used to introduce the needle under continuous pulsed fluoroscopy. Once target was reached, antero-posterior, oblique, and lateral fluoroscopic projection used confirm needle placement in all planes. Images permanently stored in EMR. Interpretation: I personally interpreted the imaging intraoperatively. Adequate needle placement confirmed in multiple planes. Appropriate spread of contrast into desired area was observed. No evidence of afferent or efferent intravascular uptake. No intrathecal or subarachnoid spread observed. Permanent images saved into the patient's record.  Antibiotic Prophylaxis:  Indication(s): None identified Antibiotic given: None  Post-operative Assessment:  EBL: None Complications: No immediate post-treatment complications observed by team, or reported by patient. Note: The patient tolerated the entire procedure  well. A repeat set of vitals were taken after the procedure and the patient was kept under observation following institutional policy, for this type of procedure. Post-procedural neurological assessment was performed, showing return to baseline, prior to discharge. The patient was provided with post-procedure discharge instructions, including a section on how to identify potential problems. Should any problems arise concerning this procedure, the patient was given instructions to immediately contact us, at any time, without hesitation. In any case, we plan to contact the patient by telephone for a follow-up status report regarding this interventional procedure. Comments:  No additional relevant information.  Plan of Care   Imaging Orders  No imaging studies ordered today   Procedure Orders    No procedure(s) ordered today    Medications ordered for procedure: No orders of the defined types were placed in this encounter.  Medications administered: Sr. Pat Patrick Sr. had no medications administered during this visit.  See the medical record for exact dosing, route, and time of administration.  This SmartLink is deprecated. Use AVSMEDLIST instead to display the medication list for a patient. Disposition: Discharge home  Discharge Date & Time: 10/21/2017;   hrs.   Physician-requested Follow-up: No Follow-up on file. Future Appointments  Date Time Provider [place]  12/24/2017 11:30 AM Vevelyn Francois, NP [facility] None   Primary Care Physician: [facility], [place] Location: [facility] Outpatient Pain Management Facility Note by: Gaspar Cola, MD Date: 10/21/2017; Time: 8:16 AM  Disclaimer:  Medicine is not an Chief Strategy Officer. The only guarantee in medicine is that nothing is guaranteed. It is important to note that the decision to proceed with this intervention was based on the information collected from the patient. The Data and conclusions were drawn from the patient's  questionnaire, the interview, and the physical examination. Because the information was provided in large part by the patient, it cannot be guaranteed that it has not been purposely or unconsciously manipulated. Every effort has been made to obtain as much relevant data as possible for this evaluation. It is important to note that the conclusions that lead to this procedure are derived in large part from the available data. Always take into account that the treatment will also be dependent on availability of resources and existing treatment guidelines, considered by other Pain Management Practitioners as being common knowledge and practice, at the time of the intervention. For Medico-Legal purposes, it is also important to point out that variation in procedural techniques and pharmacological choices  are the acceptable norm. The indications, contraindications, technique, and results of the above procedure should only be interpreted and judged by a Board-Certified Interventional Pain Specialist with extensive familiarity and expertise in the same exact procedure and technique.

## 2017-11-25 ENCOUNTER — Encounter: Payer: Self-pay | Admitting: Pain Medicine

## 2017-11-25 ENCOUNTER — Ambulatory Visit
Admission: RE | Admit: 2017-11-25 | Discharge: 2017-11-25 | Disposition: A | Discharge status: Home / Self Care | Payer: Medicare Other | Source: Ambulatory Visit | Attending: Pain Medicine | Admitting: Pain Medicine

## 2017-11-25 ENCOUNTER — Ambulatory Visit (HOSPITAL_BASED_OUTPATIENT_CLINIC_OR_DEPARTMENT_OTHER): Payer: Medicare Other | Admitting: Pain Medicine

## 2017-11-25 ENCOUNTER — Other Ambulatory Visit: Payer: Self-pay

## 2017-11-25 VITALS — BP 199/93 | HR 68 | Temp 98.6°F | Resp 16 | Ht 69.0 in | Wt 148.0 lb

## 2017-11-25 DIAGNOSIS — M79602 Pain in left arm: Secondary | ICD-10-CM | POA: Insufficient documentation

## 2017-11-25 DIAGNOSIS — M5030 Other cervical disc degeneration, unspecified cervical region: Secondary | ICD-10-CM | POA: Diagnosis not present

## 2017-11-25 DIAGNOSIS — M961 Postlaminectomy syndrome, not elsewhere classified: Secondary | ICD-10-CM | POA: Insufficient documentation

## 2017-11-25 DIAGNOSIS — M5412 Radiculopathy, cervical region: Secondary | ICD-10-CM | POA: Diagnosis present

## 2017-11-25 DIAGNOSIS — M47812 Spondylosis without myelopathy or radiculopathy, cervical region: Secondary | ICD-10-CM

## 2017-11-25 DIAGNOSIS — M79604 Pain in right leg: Secondary | ICD-10-CM

## 2017-11-25 DIAGNOSIS — G8929 Other chronic pain: Secondary | ICD-10-CM | POA: Diagnosis not present

## 2017-11-25 DIAGNOSIS — M5136 Other intervertebral disc degeneration, lumbar region: Secondary | ICD-10-CM | POA: Insufficient documentation

## 2017-11-25 MED ORDER — SODIUM CHLORIDE 0.9 % IJ SOLN
INTRAMUSCULAR | Status: AC
  Filled 2017-11-25: qty 10

## 2017-11-25 MED ORDER — ROPIVACAINE HCL 2 MG/ML IJ SOLN
1.0000 mL | Freq: Once | INTRAMUSCULAR | Status: AC
  Administered 2017-11-25: 10 mL via EPIDURAL

## 2017-11-25 MED ORDER — LIDOCAINE HCL 2 % IJ SOLN
INTRAMUSCULAR | Status: AC
  Filled 2017-11-25: qty 20

## 2017-11-25 MED ORDER — LIDOCAINE HCL 2 % IJ SOLN
10.0000 mL | Freq: Once | INTRAMUSCULAR | Status: AC
  Administered 2017-11-25: 400 mg

## 2017-11-25 MED ORDER — DEXAMETHASONE SODIUM PHOSPHATE 10 MG/ML IJ SOLN
INTRAMUSCULAR | Status: AC
  Filled 2017-11-25: qty 1

## 2017-11-25 MED ORDER — DEXAMETHASONE SODIUM PHOSPHATE 10 MG/ML IJ SOLN
10.0000 mg | Freq: Once | INTRAMUSCULAR | Status: AC
  Administered 2017-11-25: 10 mg

## 2017-11-25 MED ORDER — IOPAMIDOL (ISOVUE-M 200) INJECTION 41%
10.0000 mL | Freq: Once | INTRAMUSCULAR | Status: AC
  Administered 2017-11-25: 10 mL via EPIDURAL
  Filled 2017-11-25: qty 10

## 2017-11-25 MED ORDER — SODIUM CHLORIDE 0.9% FLUSH
1.0000 mL | Freq: Once | INTRAVENOUS | Status: AC
  Administered 2017-11-25: 10 mL

## 2017-11-25 MED ORDER — ROPIVACAINE HCL 2 MG/ML IJ SOLN
INTRAMUSCULAR | Status: AC
  Filled 2017-11-25: qty 10

## 2017-11-25 NOTE — Patient Instructions (Addendum)
____________________________________________________________________________________________  Post-Procedure instructions Instructions:  Apply ice: Fill a plastic sandwich bag with crushed ice. Cover it with a small towel and apply to injection site. Apply for 15 minutes then remove x 15 minutes. Repeat sequence on day of procedure, until you go to bed. The purpose is to minimize swelling and discomfort after procedure.  Apply heat: Apply heat to procedure site starting the day following the procedure. The purpose is to treat any soreness and discomfort from the procedure.  Food intake: Start with clear liquids (like water) and advance to regular food, as tolerated.   Physical activities: Keep activities to a minimum for the first 8 hours after the procedure.   Driving: If you have received any sedation, you are not allowed to drive for 24 hours after your procedure.  Blood thinner: Restart your blood thinner 6 hours after your procedure. (Only for those taking blood thinners)  Insulin: As soon as you can eat, you may resume your normal dosing schedule. (Only for those taking insulin)  Infection prevention: Keep procedure site clean and dry.  Post-procedure Pain Diary: Extremely important that this be done correctly and accurately. Recorded information will be used to determine the next step in treatment.  Pain evaluated is that of treated area only. Do not include pain from an untreated area.  Complete every hour, on the hour, for the initial 8 hours. Set an alarm to help you do this part accurately.  Do not go to sleep and have it completed later. It will not be accurate.  Follow-up appointment: Keep your follow-up appointment after the procedure. Usually 2 weeks for most procedures. (6 weeks in the case of radiofrequency.) Bring you pain diary.  Expect:  From numbing medicine (AKA: Local Anesthetics): Numbness or decrease in pain.  Onset: Full effect within 15 minutes of  injected.  Duration: It will depend on the type of local anesthetic used. On the average, 1 to 8 hours.   From steroids: Decrease in swelling or inflammation. Once inflammation is improved, relief of the pain will follow.  Onset of benefits: Depends on the amount of swelling present. The more swelling, the longer it will take for the benefits to be seen. In some cases, up to 10 days.  Duration: Steroids will stay in the system x 2 weeks. Duration of benefits will depend on multiple posibilities including persistent irritating factors.  From procedure: Some discomfort is to be expected once the numbing medicine wears off. This should be minimal if ice and heat are applied as instructed. Call if:  You experience numbness and weakness that gets worse with time, as opposed to wearing off.  New onset bowel or bladder incontinence. (Spinal procedures only)  Emergency Numbers:  Durning business hours (Monday - Thursday, 8:00 AM - 4:00 PM) (Friday, 9:00 AM - 12:00 Noon): [phone]  After hours: [phone] ____________________________________________________________________________________________   ____________________________________________________________________________________________  Post-Procedure instructions Instructions:  Apply ice: Fill a plastic sandwich bag with crushed ice. Cover it with a small towel and apply to injection site. Apply for 15 minutes then remove x 15 minutes. Repeat sequence on day of procedure, until you go to bed. The purpose is to minimize swelling and discomfort after procedure.  Apply heat: Apply heat to procedure site starting the day following the procedure. The purpose is to treat any soreness and discomfort from the procedure.  Food intake: Start with clear liquids (like water) and advance to regular food, as tolerated.   Physical activities: Keep activities to  a minimum for the first 8 hours after the procedure.   Driving: If you have  received any sedation, you are not allowed to drive for 24 hours after your procedure.  Blood thinner: Restart your blood thinner 6 hours after your procedure. (Only for those taking blood thinners)  Insulin: As soon as you can eat, you may resume your normal dosing schedule. (Only for those taking insulin)  Infection prevention: Keep procedure site clean and dry.  Post-procedure Pain Diary: Extremely important that this be done correctly and accurately. Recorded information will be used to determine the next step in treatment.  Pain evaluated is that of treated area only. Do not include pain from an untreated area.  Complete every hour, on the hour, for the initial 8 hours. Set an alarm to help you do this part accurately.  Do not go to sleep and have it completed later. It will not be accurate.  Follow-up appointment: Keep your follow-up appointment after the procedure. Usually 2 weeks for most procedures. (6 weeks in the case of radiofrequency.) Bring you pain diary.  Expect:  From numbing medicine (AKA: Local Anesthetics): Numbness or decrease in pain.  Onset: Full effect within 15 minutes of injected.  Duration: It will depend on the type of local anesthetic used. On the average, 1 to 8 hours.   From steroids: Decrease in swelling or inflammation. Once inflammation is improved, relief of the pain will follow.  Onset of benefits: Depends on the amount of swelling present. The more swelling, the longer it will take for the benefits to be seen. In some cases, up to 10 days.  Duration: Steroids will stay in the system x 2 weeks. Duration of benefits will depend on multiple posibilities including persistent irritating factors.  From procedure: Some discomfort is to be expected once the numbing medicine wears off. This should be minimal if ice and heat are applied as instructed. Call if:  You experience numbness and weakness that gets worse with time, as opposed to wearing  off.  New onset bowel or bladder incontinence. (Spinal procedures only)  Emergency Numbers:  [place] hours (Monday - Thursday, 8:00 AM - 4:00 PM) (Friday, 9:00 AM - 12:00 Noon): [phone]  After hours: [phone] ____________________________________________________________________________________________   Epidural Steroid Injection Patient Information  Description: The epidural space surrounds the nerves as they exit the spinal cord.  In some patients, the nerves can be compressed and inflamed by a bulging disc or a tight spinal canal (spinal stenosis).  By injecting steroids into the epidural space, we can bring irritated nerves into direct contact with a potentially helpful medication.  These steroids act directly on the irritated nerves and can reduce swelling and inflammation which often leads to decreased pain.  Epidural steroids may be injected anywhere along the spine and from the neck to the low back depending upon the location of your pain.   After numbing the skin with local anesthetic (like Novocaine), a small needle is passed into the epidural space slowly.  You may experience a sensation of pressure while this is being done.  The entire block usually last less than 10 minutes.  Conditions which may be treated by epidural steroids:   Low back and leg pain  Neck and arm pain  Spinal stenosis  Post-laminectomy syndrome  Herpes zoster (shingles) pain  Pain from compression fractures  Preparation for the injection:  1. Do not eat any solid food or dairy products within 8 hours of your appointment.  2. You may drink clear liquids up to 3 hours before appointment.  Clear liquids include water, black coffee, juice or soda.  No milk or cream please. 3. You may take your regular medication, including pain medications, with a sip of water before your appointment  Diabetics should hold regular insulin (if taken separately) and take 1/2 normal NPH dos the  morning of the procedure.  Carry some sugar containing items with you to your appointment. 4. A driver must accompany you and be prepared to drive you home after your procedure.  5. Bring all your current medications with your. 6. An IV may be inserted and sedation may be given at the discretion of the physician.   7. A blood pressure cuff, EKG and other monitors will often be applied during the procedure.  Some patients may need to have extra oxygen administered for a short period. 8. You will be asked to provide medical information, including your allergies, prior to the procedure.  We must know immediately if you are taking blood thinners (like Coumadin/Warfarin)  Or if you are allergic to IV iodine contrast (dye). We must know if you could possible be pregnant.  Possible side-effects:  Bleeding from needle site  Infection (rare, may require surgery)  Nerve injury (rare)  Numbness & tingling (temporary)  Difficulty urinating (rare, temporary)  Spinal headache ( a headache worse with upright posture)  Light -headedness (temporary)  Pain at injection site (several days)  Decreased blood pressure (temporary)  Weakness in arm/leg (temporary)  Pressure sensation in back/neck (temporary)  Call if you experience:  Fever/chills associated with headache or increased back/neck pain.  Headache worsened by an upright position.  New onset weakness or numbness of an extremity below the injection site  Hives or difficulty breathing (go to the emergency room)  Inflammation or drainage at the infection site  Severe back/neck pain  Any new symptoms which are concerning to you  Please note:  Although the local anesthetic injected can often make your back or neck feel good for several hours after the injection, the pain will likely return.  It takes 3-7 days for steroids to work in the epidural space.  You may not notice any pain relief for at least that one week.  If effective, we  will often do a series of three injections spaced 3-6 weeks apart to maximally decrease your pain.  After the initial series, we generally will wait several months before considering a repeat injection of the same type.  If you have any questions, please call [phone] [facility]

## 2017-11-25 NOTE — Progress Notes (Signed)
Patient's Name: Rick ACY Sr.  MRN: [id number]  Referring Provider: [facility], [place]  DOB: 09/05/1951  PCP: [facility], [place]  DOS: 11/25/2017  Note by: Gaspar Cola, MD  Service setting: Ambulatory outpatient  Specialty: Interventional Pain Management  Patient type: Established  Location: [facility] (AMB) Pain Management Facility  Visit type: Interventional Procedure   Primary Reason for Visit: Interventional Pain Management Treatment. CC: Neck Pain  Procedure:       Anesthesia, Analgesia, Anxiolysis:  Type: Palliative, Inter-Laminar, Epidural Steroid Injection #5  Region: Posterior Cervico-thoracic Region Level: C7-T1 Laterality: Left-Sided Paramedial  Type: Local Anesthesia Local Anesthetic: Lidocaine 1% Route: Infiltration ([place]/IM) IV Access: Declined Sedation: Declined  Indication(s): Analgesia           Indications: 1. Chronic cervical radicular pain (Left)   2. Chronic upper extremity pain (Left)   3. Cervical spondylosis   4. DDD (degenerative disc disease), cervical   5. Failed cervical surgery syndrome    Pain Score: Pre-procedure: 5 /10 Post-procedure: 2 /10  Pre-op Assessment:  Rick Carter is a 67 y.o. (year old), male patient, seen today for interventional treatment. He  has a past surgical history that includes Tonsillectomy; Lung removal, partial; and Neck surgery. Rick Carter has a current medication list which includes the following prescription(s): aclidinium bromide, advair diskus, albuterol, aspirin ec, calcium carb-cholecalciferol, cholecalciferol, diphenhydramine, fluticasone-salmeterol, hydrochlorothiazide, hydroxyzine, ibuprofen, ipratropium-albuterol, loratadine, metformin, methocarbamol, multiple vitamins-minerals, oxycodone-acetaminophen, oxycodone-acetaminophen, pantoprazole, pregabalin, sildenafil, sodium chloride hypertonic, trazodone, tudorza pressair, umeclidinium bromide, docusate sodium, and oxycodone-acetaminophen. His primarily concern today is  the Neck Pain  Initial Vital Signs:  Pulse Rate: 68 Temp: 98.6 F (37 C) Resp: 18 BP: (!) 177/81 SpO2: 96 %  BMI: Estimated body mass index is 21.86 kg/m as calculated from the following:   Height as of this encounter: _0  (1.753 m).   Weight as of this encounter: 148 lb (67.1 kg).  Risk Assessment: Allergies: Reviewed. He is allergic to atorvastatin and varenicline.  Allergy Precautions: None required Coagulopathies: Reviewed. None identified.  Blood-thinner therapy: None at this time Active Infection(s): Reviewed. None identified. Rick Carter is afebrile  Site Confirmation: Rick Carter was asked to confirm the procedure and laterality before marking the site Procedure checklist: Completed Consent: Before the procedure and under the influence of no sedative(s), amnesic(s), or anxiolytics, the patient was informed of the treatment options, risks and possible complications. To fulfill our ethical and legal obligations, as recommended by the American Medical Association's Code of Ethics, I have informed the patient of my clinical impression; the nature and purpose of the treatment or procedure; the risks, benefits, and possible complications of the intervention; the alternatives, including doing nothing; the risk(s) and benefit(s) of the alternative treatment(s) or procedure(s); and the risk(s) and benefit(s) of doing nothing. The patient was provided information about the general risks and possible complications associated with the procedure. These may include, but are not limited to: failure to achieve desired goals, infection, bleeding, organ or nerve damage, allergic reactions, paralysis, and death. In addition, the patient was informed of those risks and complications associated to Spine-related procedures, such as failure to decrease pain; infection (i.e.: Meningitis, epidural or intraspinal abscess); bleeding (i.e.: epidural hematoma, subarachnoid hemorrhage, or any other type of  intraspinal or peri-dural bleeding); organ or nerve damage (i.e.: Any type of peripheral nerve, nerve root, or spinal cord injury) with subsequent damage to sensory, motor, and/or autonomic systems, resulting in permanent pain, numbness, and/or weakness of one or several areas of the  body; allergic reactions; (i.e.: anaphylactic reaction); and/or death. Furthermore, the patient was informed of those risks and complications associated with the medications. These include, but are not limited to: allergic reactions (i.e.: anaphylactic or anaphylactoid reaction(s)); adrenal axis suppression; blood sugar elevation that in diabetics may result in ketoacidosis or comma; water retention that in patients with history of congestive heart failure may result in shortness of breath, pulmonary edema, and decompensation with resultant heart failure; weight gain; swelling or edema; medication-induced neural toxicity; particulate matter embolism and blood vessel occlusion with resultant organ, and/or nervous system infarction; and/or aseptic necrosis of one or more joints. Finally, the patient was informed that Medicine is not an exact science; therefore, there is also the possibility of unforeseen or unpredictable risks and/or possible complications that may result in a catastrophic outcome. The patient indicated having understood very clearly. We have given the patient no guarantees and we have made no promises. Enough time was given to the patient to ask questions, all of which were answered to the patient's satisfaction. Rick Carter has indicated that he wanted to continue with the procedure. Attestation: I, the ordering provider, attest that I have discussed with the patient the benefits, risks, side-effects, alternatives, likelihood of achieving goals, and potential problems during recovery for the procedure that I have provided informed consent. Date:  Time: 11/25/2017 11:51 AM  Pre-Procedure Preparation:  Monitoring:  As per clinic protocol. Respiration, ETCO2, SpO2, BP, heart rate and rhythm monitor placed and checked for adequate function Safety Precautions: Patient was assessed for positional comfort and pressure points before starting the procedure. Time-out: I initiated and conducted the "Time-out" before starting the procedure, as per protocol. The patient was asked to participate by confirming the accuracy of the "Time Out" information. Verification of the correct person, site, and procedure were performed and confirmed by me, the nursing staff, and the patient. "Time-out" conducted as per Joint Commission's Universal Protocol (UP.01.01.01). "Time-out" Date & Time: 11/25/2017; 1303 hrs.  Description of Procedure Process:   Position: Prone with head of the table was raised to facilitate breathing. Target Area: For Epidural Steroid injections the target is the interlaminar space, initially targeting the lower border of the superior vertebral body lamina. Approach: Paramedial approach. Area Prepped: Entire PosteriorCervical Region Prepping solution: ChloraPrep (2% chlorhexidine gluconate and 70% isopropyl alcohol) Safety Precautions: Aspiration looking for blood return was conducted prior to all injections. At no point did we inject any substances, as a needle was being advanced. No attempts were made at seeking any paresthesias. Safe injection practices and needle disposal techniques used. Medications properly checked for expiration dates. SDV (single dose vial) medications used. Description of the Procedure: Protocol guidelines were followed. The procedure needle was introduced through the skin, ipsilateral to the reported pain, and advanced to the target area. Bone was contacted and the needle walked caudad, until the lamina was cleared. The epidural space was identified using "loss-of-resistance technique" with 2-3 ml of PF-NaCl (0.9% NSS), in a 5cc LOR glass syringe. Vitals:   11/25/17 1203 11/25/17 1300  11/25/17 1306 11/25/17 1309  BP: (!) 177/81 (!) 176/80 (!) 183/78 (!) 199/93  Pulse: 68     Resp: _0 Temp: 98.6 F (37 C)     TempSrc: Oral     SpO2: 96% 98% 96% 97%  Weight: 148 lb (67.1 kg)     Height: _1  (1.753 m)       Start Time: 1303 hrs. End Time: 1309 hrs. Materials:  Needle(s)  Type: Epidural needle Gauge: 17G Length: 3.5-in Medication(s): Please see orders for medications and dosing details.  Imaging Guidance (Spinal):  Type of Imaging Technique: Fluoroscopy Guidance (Spinal) Indication(s): Assistance in needle guidance and placement for procedures requiring needle placement in or near specific anatomical locations not easily accessible without such assistance. Exposure Time: Please see nurses notes. Contrast: Before injecting any contrast, we confirmed that the patient did not have an allergy to iodine, shellfish, or radiological contrast. Once satisfactory needle placement was completed at the desired level, radiological contrast was injected. Contrast injected under live fluoroscopy. No contrast complications. See chart for type and volume of contrast used. Fluoroscopic Guidance: I was personally present during the use of fluoroscopy. "Tunnel Vision Technique" used to obtain the best possible view of the target area. Parallax error corrected before commencing the procedure. "Direction-depth-direction" technique used to introduce the needle under continuous pulsed fluoroscopy. Once target was reached, antero-posterior, oblique, and lateral fluoroscopic projection used confirm needle placement in all planes. Images permanently stored in EMR. Interpretation: I personally interpreted the imaging intraoperatively. Adequate needle placement confirmed in multiple planes. Appropriate spread of contrast into desired area was observed. No evidence of afferent or efferent intravascular uptake. No intrathecal or subarachnoid spread observed. Permanent images saved into the  patient's record.  Antibiotic Prophylaxis:   Anti-infectives (From admission, onward)   None     Indication(s): None identified  Post-operative Assessment:  Post-procedure Vital Signs:  Pulse Rate: 68 Temp: 98.6 F (37 C) Resp: 16 BP: (!) 199/93 SpO2: 97 %  EBL: None  Complications: No immediate post-treatment complications observed by team, or reported by patient.  Note: The patient tolerated the entire procedure well. A repeat set of vitals were taken after the procedure and the patient was kept under observation following institutional policy, for this type of procedure. Post-procedural neurological assessment was performed, showing return to baseline, prior to discharge. The patient was provided with post-procedure discharge instructions, including a section on how to identify potential problems. Should any problems arise concerning this procedure, the patient was given instructions to immediately contact us, at any time, without hesitation. In any case, we plan to contact the patient by telephone for a follow-up status report regarding this interventional procedure.  Comments:  No additional relevant information.  Plan of Care   Imaging Orders     DG C-Arm 1-60 Min-No Report  Procedure Orders     Cervical Epidural Injection     Cervical Epidural Injection  Medications ordered for procedure: Meds ordered this encounter  Medications  . iopamidol (ISOVUE-M) 41 % intrathecal injection 10 mL    Must be Myelogram-compatible. If not available, you may substitute with a water-soluble, non-ionic, hypoallergenic, myelogram-compatible radiological contrast medium.  Marland Kitchen lidocaine (XYLOCAINE) 2 % (with pres) injection 200 mg  . sodium chloride flush (NS) 0.9 % injection 1 mL  . ropivacaine (PF) 2 mg/mL (0.2%) (NAROPIN) injection 1 mL  . dexamethasone (DECADRON) injection 10 mg   Medications administered: We administered iopamidol, lidocaine, sodium chloride flush, ropivacaine  (PF) 2 mg/mL (0.2%), and dexamethasone.  See the medical record for exact dosing, route, and time of administration.  New Prescriptions   No medications on file   Disposition: Discharge home  Discharge Date & Time: 11/25/2017; 1317 hrs.   Physician-requested Follow-up: Return for post-procedure eval (2 wks), w/ Dr. Dossie Arbour.  Future Appointments  Date Time Provider [place]  12/10/2017  1:15 PM Milinda Pointer, MD [facility] None  12/24/2017 11:30 AM Vevelyn Francois, NP  [facility] None   Primary Care Physician: [facility], [place] Location: [facility] Outpatient Pain Management Facility Note by: Gaspar Cola, MD Date: 11/25/2017; Time: 1:26 PM  Disclaimer:  Medicine is not an Chief Strategy Officer. The only guarantee in medicine is that nothing is guaranteed. It is important to note that the decision to proceed with this intervention was based on the information collected from the patient. The Data and conclusions were drawn from the patient's questionnaire, the interview, and the physical examination. Because the information was provided in large part by the patient, it cannot be guaranteed that it has not been purposely or unconsciously manipulated. Every effort has been made to obtain as much relevant data as possible for this evaluation. It is important to note that the conclusions that lead to this procedure are derived in large part from the available data. Always take into account that the treatment will also be dependent on availability of resources and existing treatment guidelines, considered by other Pain Management Practitioners as being common knowledge and practice, at the time of the intervention. For Medico-Legal purposes, it is also important to point out that variation in procedural techniques and pharmacological choices are the acceptable norm. The indications, contraindications, technique, and results of the above procedure should only be interpreted and judged by a  Board-Certified Interventional Pain Specialist with extensive familiarity and expertise in the same exact procedure and technique.

## 2017-11-26 ENCOUNTER — Telehealth: Payer: Self-pay

## 2017-11-26 NOTE — Telephone Encounter (Signed)
Patient states that he is a little sore and will be using a heating pad soon. Instructed to call if needed.

## 2017-12-10 ENCOUNTER — Ambulatory Visit: Payer: Medicare Other | Admitting: Pain Medicine

## 2017-12-24 ENCOUNTER — Encounter: Payer: Self-pay | Admitting: Nurse Practitioner

## 2017-12-24 ENCOUNTER — Ambulatory Visit: Payer: Medicare Other | Attending: Nurse Practitioner | Admitting: Nurse Practitioner

## 2017-12-24 VITALS — BP 179/77 | HR 71 | Temp 97.9°F | Resp 16 | Ht 69.0 in | Wt 148.0 lb

## 2017-12-24 DIAGNOSIS — Z760 Encounter for issue of repeat prescription: Secondary | ICD-10-CM | POA: Diagnosis not present

## 2017-12-24 DIAGNOSIS — M47812 Spondylosis without myelopathy or radiculopathy, cervical region: Secondary | ICD-10-CM | POA: Diagnosis not present

## 2017-12-24 DIAGNOSIS — Z79899 Other long term (current) drug therapy: Secondary | ICD-10-CM | POA: Diagnosis not present

## 2017-12-24 DIAGNOSIS — M5116 Intervertebral disc disorders with radiculopathy, lumbar region: Secondary | ICD-10-CM | POA: Insufficient documentation

## 2017-12-24 DIAGNOSIS — T402X5A Adverse effect of other opioids, initial encounter: Secondary | ICD-10-CM

## 2017-12-24 DIAGNOSIS — E785 Hyperlipidemia, unspecified: Secondary | ICD-10-CM | POA: Insufficient documentation

## 2017-12-24 DIAGNOSIS — G894 Chronic pain syndrome: Secondary | ICD-10-CM | POA: Diagnosis not present

## 2017-12-24 DIAGNOSIS — M47816 Spondylosis without myelopathy or radiculopathy, lumbar region: Secondary | ICD-10-CM | POA: Diagnosis not present

## 2017-12-24 DIAGNOSIS — M7918 Myalgia, other site: Secondary | ICD-10-CM

## 2017-12-24 DIAGNOSIS — K5903 Drug induced constipation: Secondary | ICD-10-CM

## 2017-12-24 DIAGNOSIS — M479 Spondylosis, unspecified: Secondary | ICD-10-CM | POA: Diagnosis not present

## 2017-12-24 DIAGNOSIS — M150 Primary generalized (osteo)arthritis: Secondary | ICD-10-CM | POA: Diagnosis not present

## 2017-12-24 DIAGNOSIS — M792 Neuralgia and neuritis, unspecified: Secondary | ICD-10-CM

## 2017-12-24 DIAGNOSIS — M159 Polyosteoarthritis, unspecified: Secondary | ICD-10-CM

## 2017-12-24 DIAGNOSIS — M5412 Radiculopathy, cervical region: Secondary | ICD-10-CM | POA: Diagnosis not present

## 2017-12-24 DIAGNOSIS — M48061 Spinal stenosis, lumbar region without neurogenic claudication: Secondary | ICD-10-CM | POA: Diagnosis not present

## 2017-12-24 DIAGNOSIS — I739 Peripheral vascular disease, unspecified: Secondary | ICD-10-CM | POA: Insufficient documentation

## 2017-12-24 MED ORDER — OXYCODONE-ACETAMINOPHEN 5-325 MG PO TABS: 1.0000 | ORAL_TABLET | Freq: Four times a day (QID) | ORAL | 0 refills | Status: DC | PRN

## 2017-12-24 MED ORDER — METHOCARBAMOL 750 MG PO TABS: 750.0000 mg | ORAL_TABLET | Freq: Three times a day (TID) | ORAL | 2 refills | Status: DC | PRN

## 2017-12-24 MED ORDER — DOCUSATE SODIUM 100 MG PO CAPS: 100.0000 mg | ORAL_CAPSULE | Freq: Every day | ORAL | 0 refills | Status: DC

## 2017-12-24 MED ORDER — IBUPROFEN 800 MG PO TABS: 800.0000 mg | ORAL_TABLET | Freq: Three times a day (TID) | ORAL | 0 refills | Status: DC | PRN

## 2017-12-24 MED ORDER — PREGABALIN 150 MG PO CAPS: 150.0000 mg | ORAL_CAPSULE | Freq: Two times a day (BID) | ORAL | 0 refills | Status: DC

## 2017-12-24 NOTE — Progress Notes (Signed)
Nursing Pain Medication Assessment:  Safety precautions to be maintained throughout the outpatient stay will include: orient to surroundings, keep bed in low position, maintain call bell within reach at all times, provide assistance with transfer out of bed and ambulation.  Medication Inspection Compliance: Pill count conducted under aseptic conditions, in front of the patient. Neither the pills nor the bottle was removed from the patient's sight at any time. Once count was completed pills were immediately returned to the patient in their original bottle.  Medication: Oxycodone/APAP Pill/Patch Count: 36 of 120 pills remain Pill/Patch Appearance: Markings consistent with prescribed medication Bottle Appearance: Standard pharmacy container. Clearly labeled. Filled Date: 02 / 20 / 2019 Last Medication intake:  Today

## 2017-12-24 NOTE — Progress Notes (Signed)
Patient's Name: Rick GINSBERG Sr.  MRN: [id number]  Referring Provider: Coward  DOB: 27-Apr-1951  PCP: [facility] [place]  DOS: 12/24/2017  Note by: Vevelyn Francois NP  Service setting: Ambulatory outpatient  Specialty: Interventional Pain Management  Location: [facility] (AMB) Pain Management Facility    Patient type: Established    Primary Reason(s) for Visit: Encounter for prescription drug management & post-procedure evaluation of chronic illness with mild to moderate exacerbation(Level of risk: moderate) CC: Neck Pain (mid ) and Back Pain (mid to lower)  HPI  Rick Carter is a 67 y.o. year old, male patient, who comes today for a post-procedure evaluation and medication management. 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[place]); Atrial fibrillation ([place]); Bipolar affective disorder ([place]); Chronic obstructive pulmonary disease (HCC); DDD (degenerative disc disease), cervical; Colon, diverticulosis; Acid reflux; Essential (primary) hypertension; HLD (hyperlipidemia); Breath shortness; Compulsive tobacco user syndrome; Diverticular disease of large intestine; Post-thoracotomy pain syndrome; Encounter for therapeutic drug level monitoring; Long term current use of opiate analgesic; Long term prescription opiate use; Uncomplicated opioid dependence ([place]); Opiate use (30 MME/Day); Fibromyalgia; Chronic pain syndrome; Lumbar facet syndrome (Bilateral) (R>L); History of alcoholism ([place]); Chronic low back pain (Primary Area of Pain) (midline); Lumbar spondylosis; Cervical spondylosis; Failed cervical surgery syndrome; Nicotine dependence; Tobacco abuse; Substance Use Disorder Risk: High; History of right-sided partial pneumonectomy; Atherosclerotic peripheral vascular disease ([place]); Musculoskeletal pain; Neurogenic pain; Neuropathic pain; Opioid-induced constipation (OIC); Osteoarthritis; NSAID induced gastritis; Thoracic spine pain; Thoracic radicular pain (Right); Chronic lumbar radicular  pain (L5/S1 dermatome) (Right); Lumbar foraminal stenosis (L5-S1) (Right); Lumbar facet arthropathy (Bilateral) (R>L); Lumbar disc protrusion (L3-4) (Right); Chronic cervical radicular pain (Left); Tumor of parotid gland; Trigger finger, left ring finger; Disturbance of skin sensation; Pain of lumbar spine (L2-3 interspinous ligament); Chronic lower extremity pain (Secondary Area of Pain) (Right); DDD (degenerative disc disease), lumbar; Failed back surgical syndrome; and Chronic upper extremity pain (Left) on their problem list. His primarily concern today is the Neck Pain (mid ) and Back Pain (mid to lower)  Pain Assessment: Location: Left, Right Neck Radiating: neck pain goes down the left arm.  and back pain goes down the right leg  Onset: More than a month ago Duration: Chronic pain Quality: Discomfort, Pressure, Other (Comment), Constant(weakness in grip on the left arm) Severity: 5 /10 (self-reported pain score)  Note: Reported level is compatible with observation.                          Effect on ADL: decreased stamina.  unable to do much of anything.  affects how he is able to perform ADL's Timing: Constant Modifying factors: heating pad and medications   Rick Carter was last seen on 09/29/2017 for a procedure. During today's appointment we reviewed Rick Carter's post-procedure results, as well as his outpatient medication regimen. He admits that his pain is stable. He states that his last interventional procedure was not effective but he feels this was related to waiting to long before seeking treatment.  Further details on both, my assessment(s), as well as the proposed treatment plan, please see below.  Controlled Substance Pharmacotherapy Assessment REMS (Risk Evaluation and Mitigation Strategy)  Analgesic:Oxycodone/APAP 5/325 one every 6 hours (20 mg/day) MME/day:30 mg/day     Janett Billow, RN  12/24/2017 11:48 AM  Sign at close encounter Nursing Pain Medication  Assessment:  Safety precautions to be maintained throughout the outpatient stay will include: orient to surroundings,  keep bed in low position, maintain call bell within reach at all times, provide assistance with transfer out of bed and ambulation.  Medication Inspection Compliance: Pill count conducted under aseptic conditions, in front of the patient. Neither the pills nor the bottle was removed from the patient's sight at any time. Once count was completed pills were immediately returned to the patient in their original bottle.  Medication: Oxycodone/APAP Pill/Patch Count: 36 of 120 pills remain Pill/Patch Appearance: Markings consistent with prescribed medication Bottle Appearance: Standard pharmacy container. Clearly labeled. Filled Date: 02 / 20 / 2019 Last Medication intake:  Today   Pharmacokinetics: Liberation and absorption (onset of action): WNL Distribution (time to peak effect): WNL Metabolism and excretion (duration of action): WNL         Pharmacodynamics: Desired effects: Analgesia: Rick Carter reports >50% benefit. Functional ability: Patient reports that medication allows him to accomplish basic ADLs Clinically meaningful improvement in function (CMIF): Sustained CMIF goals met Perceived effectiveness: Described as relatively effective, allowing for increase in activities of daily living (ADL) Undesirable effects: Side-effects or Adverse reactions: None reported Monitoring: [place] PMP: Online review of the past 47-monthperiod conducted. Compliant with practice rules and regulations Last UDS on record: Summary  Date Value Ref Range Status  06/26/2017 FINAL  Final    Comment:    ==================================================================== TOXASSURE SELECT 13 (MW) ==================================================================== Test                             Result       Flag       Units Drug Present and Declared for Prescription Verification   Oxycodone                       678          EXPECTED   ng/mg creat   Oxymorphone                    2414         EXPECTED   ng/mg creat   Noroxycodone                   1869         EXPECTED   ng/mg creat   Noroxymorphone                 668          EXPECTED   ng/mg creat    Sources of oxycodone are scheduled prescription medications.    Oxymorphone, noroxycodone, and noroxymorphone are expected    metabolites of oxycodone. Oxymorphone is also available as a    scheduled prescription medication. ==================================================================== Test                      Result    Flag   Units      Ref Range   Creatinine              59               mg/dL      >=20 ==================================================================== Declared Medications:  The flagging and interpretation on this report are based on the  following declared medications.  Unexpected results may arise from  inaccuracies in the declared medications.  **Note: The testing scope of this panel includes these medications:  Oxycodone (Percocet)  Oxycodone (Roxicet)  **Note: The testing scope of this panel does  not include following  reported medications:  Acetaminophen (Percocet)  Acetaminophen (Roxicet)  Aclidinium  Albuterol  Albuterol (Duoneb)  Aspirin (Aspirin 81)  Calcium Carbonate (Calcium Carb)  Cholecalciferol  Diphenhydramine (Benadryl)  Docusate (Colace)  Fluticasone (Advair)  Hydrochlorothiazide (Microzide)  Hydroxyzine  Ibuprofen  Ipratropium (Duoneb)  Loratadine (Claritin)  Metformin  Methocarbamol (Robaxin)  Multivitamin (MVI)  Pantoprazole (Protonix)  Pregabalin (Lyrica)  Salmeterol (Advair)  Sildenafil (Viagra)  Sodium Chloride  Trazodone  Umeclidinium (Incruse Ellipta)  Vitamin D ==================================================================== For clinical consultation, please call [phone]. ====================================================================     UDS interpretation: Compliant          Medication Assessment Form: Reviewed. Patient indicates being compliant with therapy Treatment compliance: Compliant Risk Assessment Profile: Aberrant behavior: See prior evaluations. None observed or detected today Comorbid factors increasing risk of overdose: See prior notes. No additional risks detected today Risk of substance use disorder (SUD): Low Opioid Risk Tool - 12/24/17 1148      Family History of Substance Abuse   Alcohol  Positive Male    Illegal Drugs  Negative    Rx Drugs  Negative      Personal History of Substance Abuse   Alcohol  Positive Male or Male recovering alcoholic sober x 15 years   recovering alcoholic sober x 15 years   Illegal Drugs  Negative    Rx Drugs  Negative      Psychological Disease   Psychological Disease  Negative    Depression  Negative      Total Score   Opioid Risk Tool Scoring  6    Opioid Risk Interpretation  Moderate Risk      ORT Scoring interpretation table:  Score <3 = Low Risk for SUD  Score between 4-7 = Moderate Risk for SUD  Score >8 = High Risk for Opioid Abuse   Risk Mitigation Strategies:  Patient Counseling: Covered Patient-Prescriber Agreement (PPA): Present and active  Notification to other healthcare providers: Done  Pharmacologic Plan: No change in therapy, at this time.             Post-Procedure Assessment  11/25/2017 Procedure: LCESI Pre-procedure pain score:  5/10 Post-procedure pain score: 2/10         Influential Factors: BMI: 21.86 kg/m Intra-procedural challenges: None observed.         Assessment challenges: None detected.              Reported side-effects: None.        Post-procedural adverse reactions or complications: None reported         Sedation: Please see nurses note. When no sedatives are used, the analgesic levels obtained are directly associated to the effectiveness of the local anesthetics. However, when sedation is provided, the level of  analgesia obtained during the initial 1 hour following the intervention, is believed to be the result of a combination of factors. These factors may include, but are not limited to: 1. The effectiveness of the local anesthetics used. 2. The effects of the analgesic(s) and/or anxiolytic(s) used. 3. The degree of discomfort experienced by the patient at the time of the procedure. 4. The patients ability and reliability in recalling and recording the events. 5. The presence and influence of possible secondary gains and/or psychosocial factors. Reported result: Relief experienced during the 1st hour after the procedure: 80 % (Ultra-Short Term Relief)            Interpretative annotation: Clinically appropriate result. Analgesia during this period is likely to be  Local Anesthetic and/or IV Sedative (Analgesic/Anxiolytic) related.          Effects of local anesthetic: The analgesic effects attained during this period are directly associated to the localized infiltration of local anesthetics and therefore cary significant diagnostic value as to the etiological location, or anatomical origin, of the pain. Expected duration of relief is directly dependent on the pharmacodynamics of the local anesthetic used. Long-acting (4-6 hours) anesthetics used.  Reported result: Relief during the next 4 to 6 hour after the procedure: 70 % (Short-Term Relief)            Interpretative annotation: Clinically appropriate result. Analgesia during this period is likely to be Local Anesthetic-related.          Long-term benefit: Defined as the period of time past the expected duration of local anesthetics (1 hour for short-acting and 4-6 hours for long-acting). With the possible exception of prolonged sympathetic blockade from the local anesthetics, benefits during this period are typically attributed to, or associated with, other factors such as analgesic sensory neuropraxia, antiinflammatory effects, or beneficial biochemical  changes provided by agents other than the local anesthetics.  Reported result: Extended relief following procedure: 0 % (Long-Term Relief)            Interpretative annotation: Clinically appropriate result. Good relief. No permanent benefit expected. Inflammation plays a part in the etiology to the pain.          Current benefits: Defined as reported results that persistent at this point in time.   Analgesia: 0-25 %            Function: Back to baseline ROM: Back to baseline Interpretative annotation: Recurrence of symptoms. No permanent benefit expected. Results would suggest further treatment needed.          Interpretation: Results would suggest failure of therapy in achieving desired goal(s). for therapeutic reasons          Plan:  Set up procedure as a PRN palliative treatment option for this patient.                Laboratory Chemistry  Inflammation Markers (CRP: Acute Phase) (ESR: Chronic Phase) Lab Results  Component Value Date   CRP <0.8 01/06/2017   ESRSEDRATE 5 01/06/2017                         Rheumatology Markers No results found for: Elayne Guerin, [facility]              Renal Function Markers Lab Results  Component Value Date   BUN 12 01/06/2017   CREATININE 0.79 01/06/2017   GFRAA >60 01/06/2017   GFRNONAA >60 01/06/2017                 Hepatic Function Markers Lab Results  Component Value Date   AST 30 01/06/2017   ALT 24 01/06/2017   ALBUMIN 4.2 01/06/2017   ALKPHOS 53 01/06/2017                 Electrolytes Lab Results  Component Value Date   NA 142 01/06/2017   K 4.0 01/06/2017   CL 107 01/06/2017   CALCIUM 9.7 01/06/2017   MG 2.2 01/06/2017                        Neuropathy Markers Lab Results  Component Value Date   VITAMINB12 637 01/06/2017  Bone Pathology Markers Lab Results  Component Value Date   25OHVITD1 58 01/06/2017   25OHVITD2 <1.0 01/06/2017   25OHVITD3 58 01/06/2017                          Coagulation Parameters No results found for: INR, LABPROT, APTT, PLT, DDIMER               Cardiovascular Markers No results found for: BNP, CKTOTAL, CKMB, TROPONINI, HGB, HCT               CA Markers No results found for: CEA, CA125, LABCA2               Note: Lab results reviewed.  Recent Diagnostic Imaging Results  DG C-Arm 1-60 Min-No Report Fluoroscopy was utilized by the requesting physician.  No radiographic  interpretation.   Complexity Note: Imaging results reviewed. Results shared with Rick Carter, using Layman's terms.                         Meds   Current Outpatient Medications:  .  Aclidinium Bromide (TUDORZA PRESSAIR IN), Inhale 40 mcg into the lungs 2 (two) times daily., Disp: , Rfl:  .  ADVAIR DISKUS 250-50 MCG/DOSE AEPB, INHALE 1 PUFF 2 (TWO) TIMES A DAY., Disp: , Rfl: 11 .  albuterol (PROVENTIL) (2.5 MG/3ML) 0.083% nebulizer solution, INHALE 0.5 ML (2.5 MG TOTAL) BY NEBULIZATION EVERY SIX (6) HOURS AS NEEDED FOR WHEEZING., Disp: , Rfl: 11 .  aspirin EC 81 MG tablet, Take 81 mg by mouth daily., Disp: , Rfl:  .  Calcium Carb-Cholecalciferol 600-800 MG-UNIT TABS, TAKE 1 TABLET BY MOUTH 2 TIMES A DAY, Disp: , Rfl:  .  cholecalciferol (VITAMIN D) 1000 UNITS tablet, Take 2,000 Units by mouth 2 (two) times daily. , Disp: , Rfl:  .  diphenhydrAMINE (BENADRYL) 25 mg capsule, Take 25 mg by mouth 2 (two) times daily. , Disp: , Rfl:  .  Fluticasone-Salmeterol (ADVAIR) 500-50 MCG/DOSE AEPB, Inhale 1 puff into the lungs 2 (two) times daily., Disp: , Rfl:  .  hydrochlorothiazide (MICROZIDE) 12.5 MG capsule, TAKE ONE CAPSULE BY MOUTH FOR HIGH BLOOD PRESSURE, Disp: , Rfl: 3 .  hydrOXYzine (ATARAX/VISTARIL) 25 MG tablet, Take 25 mg by mouth as needed., Disp: , Rfl:  .  [START ON 01/02/2018] ibuprofen (ADVIL,MOTRIN) 800 MG tablet, Take 1 tablet (800 mg total) by mouth every 8 (eight) hours as needed for moderate pain., Disp: 270 tablet, Rfl: 0 .  ipratropium-albuterol  (DUONEB) 0.5-2.5 (3) MG/3ML SOLN, Take 3 mLs by nebulization every 6 (six) hours as needed., Disp: , Rfl:  .  loratadine (CLARITIN) 10 MG tablet, Take 10 mg by mouth. , Disp: , Rfl:  .  metFORMIN (GLUCOPHAGE) 500 MG tablet, Take 500 mg by mouth 2 (two) times daily with a meal., Disp: , Rfl:  .  [START ON 01/02/2018] methocarbamol (ROBAXIN) 750 MG tablet, Take 1 tablet (750 mg total) by mouth every 8 (eight) hours as needed for muscle spasms., Disp: 90 tablet, Rfl: 2 .  Multiple Vitamins-Minerals (PRESERVISION AREDS 2 PO), Take 1 tablet by mouth 2 (two) times daily., Disp: , Rfl:  .  [START ON 02/01/2018] oxyCODONE-acetaminophen (PERCOCET/ROXICET) 5-325 MG tablet, Take 1 tablet by mouth every 6 (six) hours as needed for severe pain., Disp: 120 tablet, Rfl: 0 .  pantoprazole (PROTONIX) 40 MG tablet, Take 40 mg by mouth., Disp: , Rfl:  .  [  START ON 01/02/2018] pregabalin (LYRICA) 150 MG capsule, Take 1 capsule (150 mg total) by mouth 2 (two) times daily., Disp: 180 capsule, Rfl: 0 .  sildenafil (VIAGRA) 100 MG tablet, 100 mg., Disp: , Rfl:  .  sodium chloride HYPERTONIC 3 % nebulizer solution, INHALE 4 ML BY NEBULIZATION TWICE A DAY, Disp: , Rfl: 6 .  traZODone (DESYREL) 50 MG tablet, Take 50 mg by mouth at bedtime. Reported on 02/20/2016, Disp: , Rfl:  .  TUDORZA PRESSAIR 400 MCG/ACT AEPB, Inhale 1 puff into the lungs 2 (two) times daily., Disp: , Rfl: 11 .  umeclidinium bromide (INCRUSE ELLIPTA) 62.5 MCG/INH AEPB, Inhale into the lungs., Disp: , Rfl:  .  [START ON 01/02/2018] docusate sodium (COLACE) 100 MG capsule, Take 1 capsule (100 mg total) by mouth daily., Disp: 90 capsule, Rfl: 0 .  [START ON 03/03/2018] oxyCODONE-acetaminophen (PERCOCET/ROXICET) 5-325 MG tablet, Take 1 tablet by mouth every 6 (six) hours as needed for severe pain., Disp: 120 tablet, Rfl: 0 .  [START ON 01/02/2018] oxyCODONE-acetaminophen (PERCOCET/ROXICET) 5-325 MG tablet, Take 1 tablet by mouth every 6 (six) hours as needed for severe  pain., Disp: 120 tablet, Rfl: 0  ROS  Constitutional: Denies any fever or chills Gastrointestinal: No reported hemesis, hematochezia, vomiting, or acute GI distress Musculoskeletal: Denies any acute onset joint swelling, redness, loss of ROM, or weakness Neurological: No reported episodes of acute onset apraxia, aphasia, dysarthria, agnosia, amnesia, paralysis, loss of coordination, or loss of consciousness  Allergies  Rick Carter is allergic to atorvastatin and varenicline.  PFSH  Drug: Rick Carter  has no drug history on file. Alcohol:  reports that he does not drink alcohol. Tobacco:  reports that he has been smoking cigarettes.  He has a 51.00 pack-year smoking history. he has never used smokeless tobacco. Medical:  has a past medical history of Anxiety, COPD (chronic obstructive pulmonary disease) ([place]), Depression, Hernia of abdominal wall, History of alcoholism ([place]) (08/08/2015), History of pneumonectomy (08/08/2015), Hypertension, Macular degeneration (08/26/2016), and Tumor cells. Surgical: Rick Carter  has a past surgical history that includes Tonsillectomy; Lung removal, partial; and Neck surgery. Family: family history includes Alcohol abuse in his father; Kidney disease in his mother.  Constitutional Exam  General appearance: Well nourished, well developed, and well hydrated. In no apparent acute distress Vitals:   12/24/17 1139  BP: (!) 179/77  Pulse: 71  Resp: 16  Temp: 97.9 F (36.6 C)  TempSrc: Oral  SpO2: 96%  Weight: 148 lb (67.1 kg)  Height: _0  (1.753 m)   BMI Assessment: Estimated body mass index is 21.86 kg/m as calculated from the following:   Height as of this encounter: _1  (1.753 m).   Weight as of this encounter: 148 lb (67.1 kg). Psych/Mental status: Alert, oriented x 3 (person, place, & time)       Eyes: PERLA Respiratory: No evidence of acute respiratory distress  Cervical Spine Area Exam  Skin & Axial Inspection: No masses, redness, edema,  swelling, or associated skin lesions Alignment: Symmetrical Functional ROM: Unrestricted ROM      Stability: No instability detected Muscle Tone/Strength: Functionally intact. No obvious neuro-muscular anomalies detected. Sensory (Neurological): Unimpaired Palpation: Complains of area being tender to palpation              Upper Extremity (UE) Exam    Side: Right upper extremity  Side: Left upper extremity  Skin & Extremity Inspection: Skin color, temperature, and hair growth are WNL. No peripheral edema or cyanosis.  No masses, redness, swelling, asymmetry, or associated skin lesions. No contractures.  Skin & Extremity Inspection: Skin color, temperature, and hair growth are WNL. No peripheral edema or cyanosis. No masses, redness, swelling, asymmetry, or associated skin lesions. No contractures.  Functional ROM: Unrestricted ROM          Functional ROM: Unrestricted ROM          Muscle Tone/Strength: Functionally intact. No obvious neuro-muscular anomalies detected.  Muscle Tone/Strength: Functionally intact. No obvious neuro-muscular anomalies detected.  Sensory (Neurological): Unimpaired          Sensory (Neurological): Unimpaired          Palpation: No palpable anomalies              Palpation: No palpable anomalies              Specialized Test(s): Deferred         Specialized Test(s): Deferred          Thoracic Spine Area Exam  Skin & Axial Inspection: No masses, redness, or swelling Alignment: Symmetrical Functional ROM: Unrestricted ROM Stability: No instability detected Muscle Tone/Strength: Functionally intact. No obvious neuro-muscular anomalies detected. Sensory (Neurological): Unimpaired Muscle strength & Tone: No palpable anomalies  Lumbar Spine Area Exam  Skin & Axial Inspection: No masses, redness, or swelling Alignment: Symmetrical Functional ROM: Unrestricted ROM      Stability: No instability detected Muscle Tone/Strength: Functionally intact. No obvious  neuro-muscular anomalies detected. Sensory (Neurological): Unimpaired Palpation: Complains of area being tender to palpation       Provocative Tests: Lumbar Hyperextension and rotation test: evaluation deferred today       Lumbar Lateral bending test: evaluation deferred today       Carter's Maneuver: evaluation deferred today                    Gait & Posture Assessment  Ambulation: Unassisted Gait: Relatively normal for age and body habitus Posture: WNL   Lower Extremity Exam    Side: Right lower extremity  Side: Left lower extremity  Skin & Extremity Inspection: Skin color, temperature, and hair growth are WNL. No peripheral edema or cyanosis. No masses, redness, swelling, asymmetry, or associated skin lesions. No contractures.  Skin & Extremity Inspection: Skin color, temperature, and hair growth are WNL. No peripheral edema or cyanosis. No masses, redness, swelling, asymmetry, or associated skin lesions. No contractures.  Functional ROM: Unrestricted ROM          Functional ROM: Unrestricted ROM          Muscle Tone/Strength: Functionally intact. No obvious neuro-muscular anomalies detected.  Muscle Tone/Strength: Functionally intact. No obvious neuro-muscular anomalies detected.  Sensory (Neurological): Unimpaired  Sensory (Neurological): Unimpaired  Palpation: No palpable anomalies  Palpation: No palpable anomalies   Assessment  Primary Diagnosis & Pertinent Problem List: The primary encounter diagnosis was Cervical spondylosis. Diagnoses of Lumbar spondylosis, Lumbar facet syndrome (Bilateral) (R>L), Primary osteoarthritis involving multiple joints, Chronic pain syndrome, Neurogenic pain, Musculoskeletal pain, Neuropathic pain, and Opioid-induced constipation (OIC) were also pertinent to this visit.  Status Diagnosis  Persistent Persistent Persistent 1. Cervical spondylosis   2. Lumbar spondylosis   3. Lumbar facet syndrome (Bilateral) (R>L)   4. Primary osteoarthritis  involving multiple joints   5. Chronic pain syndrome   6. Neurogenic pain   7. Musculoskeletal pain   8. Neuropathic pain   9. Opioid-induced constipation (OIC)     Problems updated and reviewed during this visit: No problems updated.  Plan of Care  Pharmacotherapy (Medications Ordered): Meds ordered this encounter  Medications  . oxyCODONE-acetaminophen (PERCOCET/ROXICET) 5-325 MG tablet    Sig: Take 1 tablet by mouth every 6 (six) hours as needed for severe pain.    Dispense:  120 tablet    Refill:  0    Do not place this medication, or any other prescription from our practice, on "Automatic Refill". Patient may have prescription filled one day early if pharmacy is closed on scheduled refill date. Do not fill until:03/03/2018 To last until: 04/02/2018    Order Specific Question:   Supervising Provider    Answer:   Milinda Pointer [phone]  . oxyCODONE-acetaminophen (PERCOCET/ROXICET) 5-325 MG tablet    Sig: Take 1 tablet by mouth every 6 (six) hours as needed for severe pain.    Dispense:  120 tablet    Refill:  0    Do not place this medication, or any other prescription from our practice, on "Automatic Refill". Patient may have prescription filled one day early if pharmacy is closed on scheduled refill date. Do not fill until: 02/01/2018 To last until:03/03/2018    Order Specific Question:   Supervising Provider    Answer:   Milinda Pointer [phone]  . oxyCODONE-acetaminophen (PERCOCET/ROXICET) 5-325 MG tablet    Sig: Take 1 tablet by mouth every 6 (six) hours as needed for severe pain.    Dispense:  120 tablet    Refill:  0    Do not place this medication, or any other prescription from our practice, on "Automatic Refill". Patient may have prescription filled one day early if pharmacy is closed on scheduled refill date. Do not fill until: 01/02/2018 To last until:02/01/2018    Order Specific Question:   Supervising Provider    Answer:   Milinda Pointer [phone]  .  methocarbamol (ROBAXIN) 750 MG tablet    Sig: Take 1 tablet (750 mg total) by mouth every 8 (eight) hours as needed for muscle spasms.    Dispense:  90 tablet    Refill:  2    Do not place this medication, or any other prescription from our practice, on "Automatic Refill". Patient may have prescription filled one day early if pharmacy is closed on scheduled refill date.    Order Specific Question:   Supervising Provider    Answer:   Milinda Pointer [phone]  . pregabalin (LYRICA) 150 MG capsule    Sig: Take 1 capsule (150 mg total) by mouth 2 (two) times daily.    Dispense:  180 capsule    Refill:  0    Do not place this medication, or any other prescription from our practice, on "Automatic Refill". Patient may have prescription filled one day early if pharmacy is closed on scheduled refill date.    Order Specific Question:   Supervising Provider    Answer:   Milinda Pointer [phone]  . ibuprofen (ADVIL,MOTRIN) 800 MG tablet    Sig: Take 1 tablet (800 mg total) by mouth every 8 (eight) hours as needed for moderate pain.    Dispense:  270 tablet    Refill:  0    Do not place this medication, or any other prescription from our practice, on "Automatic Refill". Patient may have prescription filled one day early if pharmacy is closed on scheduled refill date.    Order Specific Question:   Supervising Provider    Answer:   Milinda Pointer [phone]  . docusate sodium (COLACE) 100 MG capsule    Sig:  Take 1 capsule (100 mg total) by mouth daily.    Dispense:  90 capsule    Refill:  0    Do not place this medication, or any other prescription from our practice, on "Automatic Refill". Patient may have prescription filled one day early if pharmacy is closed on scheduled refill date.    Order Specific Question:   Supervising Provider    Answer:   Milinda Pointer [[id number]]   New Prescriptions   No medications on file   Medications administered today: Rick Carter Sr. had no  medications administered during this visit. Lab-work, procedure(s), and/or referral(s): No orders of the defined types were placed in this encounter.  Imaging and/or referral(s): None  Interventional therapies: Planned, scheduled, and/or pending:  Not at time   Considering:  Palliative bilateral lumbar facet block Palliative left-sided cervical epiduralsteroid injection  Palliative right-sided L5-S1 lumbar epiduralsteroid injection  Diagnostic bilateral cervical facet block Palliative Right-sided T8-9 thoracic epiduralsteroid injection    Palliative PRN treatment(s):  Palliativebilateral lumbar facet block Palliative left-sided cervical epidural steroid injection  Palliative right-sided L5-S1 lumbar epiduralsteroid injection       Provider-requested follow-up: Return in about 3 months (around 03/26/2018) for MedMgmt with Me Dionisio David).  Future Appointments  Date Time Provider [place]  03/26/2018 11:15 AM Vevelyn Francois, NP [facility] None   Primary Care Physician: [facility] [place] Location: [facility] Facility Note by: Vevelyn Francois NP Date: 12/24/2017; Time: 3:31 PM  Pain Score Disclaimer: We use the NRS-11 scale. This is a self-reported, subjective measurement of pain severity with only modest accuracy. It is used primarily to identify changes within a particular patient. It must be understood that outpatient pain scales are significantly less accurate that those used for research, where they can be applied under ideal controlled circumstances with minimal exposure to variables. In reality, the score is likely to be a combination of pain intensity and pain affect, where pain affect describes the degree of emotional arousal or changes in action readiness caused by the sensory experience of pain. Factors such as social and work situation, setting, emotional state, anxiety levels, expectation, and prior pain experience may  influence pain perception and show large inter-individual differences that may also be affected by time variables.  Patient instructions provided during this appointment: Patient Instructions  ____________________________________________________________________________________________  Medication Rules  Applies to: All patients receiving prescriptions (written or electronic).  Pharmacy of record: Pharmacy where electronic prescriptions will be sent. If written prescriptions are taken to a different pharmacy, please inform the nursing staff. The pharmacy listed in the electronic medical record should be the one where you would like electronic prescriptions to be sent.  Prescription refills: Only during scheduled appointments. Applies to both, written and electronic prescriptions.  NOTE: The following applies primarily to controlled substances (Opioid* Pain Medications).   Patient's responsibilities: 1. Pain Pills: Bring all pain pills to every appointment (except for procedure appointments). 2. Pill Bottles: Bring pills in original pharmacy bottle. Always bring newest bottle. Bring bottle, even if empty. 3. Medication refills: You are responsible for knowing and keeping track of what medications you need refilled. The day before your appointment, write a list of all prescriptions that need to be refilled. Bring that list to your appointment and give it to the admitting nurse. Prescriptions will be written only during appointments. If you forget a medication, it will not be "Called in", "Faxed", or "electronically sent". You will need to get another appointment to  get these prescribed. 4. Prescription Accuracy: You are responsible for carefully inspecting your prescriptions before leaving our office. Have the discharge nurse carefully go over each prescription with you, before taking them home. Make sure that your name is accurately spelled, that your address is correct. Check the name and dose of  your medication to make sure it is accurate. Check the number of pills, and the written instructions to make sure they are clear and accurate. Make sure that you are given enough medication to last until your next medication refill appointment. 5. Taking Medication: Take medication as prescribed. Never take more pills than instructed. Never take medication more frequently than prescribed. Taking less pills or less frequently is permitted and encouraged, when it comes to controlled substances (written prescriptions).  6. Inform other Doctors: Always inform, all of your healthcare providers, of all the medications you take. 7. Pain Medication from other Providers: You are not allowed to accept any additional pain medication from any other Doctor or Healthcare provider. There are two exceptions to this rule. (see below) In the event that you require additional pain medication, you are responsible for notifying us, as stated below. 8. Medication Agreement: You are responsible for carefully reading and following our Medication Agreement. This must be signed before receiving any prescriptions from our practice. Safely store a copy of your signed Agreement. Violations to the Agreement will result in no further prescriptions. (Additional copies of our Medication Agreement are available upon request.) 9. Laws, Rules, & Regulations: All patients are expected to follow all Federal and [employer], Rules, [employer]. Ignorance of the Laws does not constitute a valid excuse. The use of any illegal substances is prohibited. 10. Adopted CDC guidelines & recommendations: Target dosing levels will be at or below 60 MME/day. Use of benzodiazepines** is not recommended.  Exceptions: There are only two exceptions to the rule of not receiving pain medications from other Healthcare Providers. 1. Exception #1 (Emergencies): In the event of an emergency (i.e.: accident requiring emergency care), you are allowed to  receive additional pain medication. However, you are responsible for: As soon as you are able, call our office [phone], at any time of the day or night, and leave a message stating your name, the date and nature of the emergency, and the name and dose of the medication prescribed. In the event that your call is answered by a member of our staff, make sure to document and save the date, time, and the name of the person that took your information.  2. Exception #2 (Planned Surgery): In the event that you are scheduled by another doctor or dentist to have any type of surgery or procedure, you are allowed (for a period no longer than 30 days), to receive additional pain medication, for the acute post-op pain. However, in this case, you are responsible for picking up a copy of our "Post-op Pain Management for Surgeons" handout, and giving it to your surgeon or dentist. This document is available at our office, and does not require an appointment to obtain it. Simply go to our office during business hours (Monday-Thursday from 8:00 AM to 4:00 PM) (Friday 8:00 AM to 12:00 Noon) or if you have a scheduled appointment with [place], prior to your surgery, and ask for it by name. In addition, you will need to provide [place] with your name, name of your surgeon, type of surgery, and date of procedure or surgery.  *Opioid medications include: morphine, codeine, oxycodone, oxymorphone, hydrocodone,  hydromorphone, meperidine, tramadol, tapentadol, buprenorphine, fentanyl, methadone. **Benzodiazepine medications include: diazepam (Valium), alprazolam (Xanax), clonazepam (Klonopine), lorazepam (Ativan), clorazepate (Tranxene), chlordiazepoxide (Librium), estazolam (Prosom), oxazepam (Serax), temazepam (Restoril), triazolam (Halcion) (Last updated: 12/11/2017) ____________________________________________________________________________________________  Prescription given oxycodone x3 and lyrica

## 2017-12-24 NOTE — Patient Instructions (Addendum)
____________________________________________________________________________________________  Medication Rules  Applies to: All patients receiving prescriptions (written or electronic).  Pharmacy of record: Pharmacy where electronic prescriptions will be sent. If written prescriptions are taken to a different pharmacy, please inform the nursing staff. The pharmacy listed in the electronic medical record should be the one where you would like electronic prescriptions to be sent.  Prescription refills: Only during scheduled appointments. Applies to both, written and electronic prescriptions.  NOTE: The following applies primarily to controlled substances (Opioid* Pain Medications).   Patient's responsibilities: 1. Pain Pills: Bring all pain pills to every appointment (except for procedure appointments). 2. Pill Bottles: Bring pills in original pharmacy bottle. Always bring newest bottle. Bring bottle, even if empty. 3. Medication refills: You are responsible for knowing and keeping track of what medications you need refilled. The day before your appointment, write a list of all prescriptions that need to be refilled. Bring that list to your appointment and give it to the admitting nurse. Prescriptions will be written only during appointments. If you forget a medication, it will not be "Called in", "Faxed", or "electronically sent". You will need to get another appointment to get these prescribed. 4. Prescription Accuracy: You are responsible for carefully inspecting your prescriptions before leaving our office. Have the discharge nurse carefully go over each prescription with you, before taking them home. Make sure that your name is accurately spelled, that your address is correct. Check the name and dose of your medication to make sure it is accurate. Check the number of pills, and the written instructions to make sure they are clear and accurate. Make sure that you are given enough medication to last  until your next medication refill appointment. 5. Taking Medication: Take medication as prescribed. Never take more pills than instructed. Never take medication more frequently than prescribed. Taking less pills or less frequently is permitted and encouraged, when it comes to controlled substances (written prescriptions).  6. Inform other Doctors: Always inform, all of your healthcare providers, of all the medications you take. 7. Pain Medication from other Providers: You are not allowed to accept any additional pain medication from any other Doctor or Healthcare provider. There are two exceptions to this rule. (see below) In the event that you require additional pain medication, you are responsible for notifying us, as stated below. 8. Medication Agreement: You are responsible for carefully reading and following our Medication Agreement. This must be signed before receiving any prescriptions from our practice. Safely store a copy of your signed Agreement. Violations to the Agreement will result in no further prescriptions. (Additional copies of our Medication Agreement are available upon request.) 9. Laws, Rules, & Regulations: All patients are expected to follow all Federal and State Laws, Statutes, Rules, & Regulations. Ignorance of the Laws does not constitute a valid excuse. The use of any illegal substances is prohibited. 10. Adopted CDC guidelines & recommendations: Target dosing levels will be at or below 60 MME/day. Use of benzodiazepines** is not recommended.  Exceptions: There are only two exceptions to the rule of not receiving pain medications from other Healthcare Providers. 1. Exception #1 (Emergencies): In the event of an emergency (i.e.: accident requiring emergency care), you are allowed to receive additional pain medication. However, you are responsible for: As soon as you are able, call our office [phone], at any time of the day or night, and leave a message stating your name, the  date and nature of the emergency, and the name and dose of the medication   prescribed. In the event that your call is answered by a member of our staff, make sure to document and save the date, time, and the name of the person that took your information.  2. Exception #2 (Planned Surgery): In the event that you are scheduled by another doctor or dentist to have any type of surgery or procedure, you are allowed (for a period no longer than 30 days), to receive additional pain medication, for the acute post-op pain. However, in this case, you are responsible for picking up a copy of our "Post-op Pain Management for Surgeons" handout, and giving it to your surgeon or dentist. This document is available at our office, and does not require an appointment to obtain it. Simply go to our office during business hours (Monday-Thursday from 8:00 AM to 4:00 PM) (Friday 8:00 AM to 12:00 Noon) or if you have a scheduled appointment with [place], prior to your surgery, and ask for it by name. In addition, you will need to provide [place] with your name, name of your surgeon, type of surgery, and date of procedure or surgery.  *Opioid medications include: morphine, codeine, oxycodone, oxymorphone, hydrocodone, hydromorphone, meperidine, tramadol, tapentadol, buprenorphine, fentanyl, methadone. **Benzodiazepine medications include: diazepam (Valium), alprazolam (Xanax), clonazepam (Klonopine), lorazepam (Ativan), clorazepate (Tranxene), chlordiazepoxide (Librium), estazolam (Prosom), oxazepam (Serax), temazepam (Restoril), triazolam (Halcion) (Last updated: 12/11/2017) ____________________________________________________________________________________________  Prescription given oxycodone x3 and lyrica

## 2018-02-17 DIAGNOSIS — D110 Benign neoplasm of parotid gland: Secondary | ICD-10-CM | POA: Insufficient documentation

## 2018-03-05 ENCOUNTER — Other Ambulatory Visit: Payer: Self-pay

## 2018-03-05 ENCOUNTER — Ambulatory Visit (HOSPITAL_BASED_OUTPATIENT_CLINIC_OR_DEPARTMENT_OTHER): Payer: Medicare Other | Admitting: Pain Medicine

## 2018-03-05 ENCOUNTER — Encounter: Payer: Self-pay | Admitting: Pain Medicine

## 2018-03-05 ENCOUNTER — Ambulatory Visit
Admission: RE | Admit: 2018-03-05 | Discharge: 2018-03-05 | Disposition: A | Discharge status: Home / Self Care | Payer: Medicare Other | Source: Ambulatory Visit | Attending: Pain Medicine | Admitting: Pain Medicine

## 2018-03-05 VITALS — BP 179/92 | HR 60 | Temp 98.1°F | Resp 15 | Ht 69.0 in | Wt 148.0 lb

## 2018-03-05 DIAGNOSIS — M48061 Spinal stenosis, lumbar region without neurogenic claudication: Secondary | ICD-10-CM

## 2018-03-05 DIAGNOSIS — M9983 Other biomechanical lesions of lumbar region: Secondary | ICD-10-CM | POA: Diagnosis not present

## 2018-03-05 DIAGNOSIS — G8929 Other chronic pain: Secondary | ICD-10-CM

## 2018-03-05 DIAGNOSIS — M5126 Other intervertebral disc displacement, lumbar region: Secondary | ICD-10-CM

## 2018-03-05 DIAGNOSIS — M5441 Lumbago with sciatica, right side: Secondary | ICD-10-CM

## 2018-03-05 DIAGNOSIS — M5116 Intervertebral disc disorders with radiculopathy, lumbar region: Secondary | ICD-10-CM | POA: Diagnosis not present

## 2018-03-05 DIAGNOSIS — M5136 Other intervertebral disc degeneration, lumbar region: Secondary | ICD-10-CM

## 2018-03-05 DIAGNOSIS — M5416 Radiculopathy, lumbar region: Secondary | ICD-10-CM

## 2018-03-05 MED ORDER — LIDOCAINE HCL 2 % IJ SOLN
20.0000 mL | Freq: Once | INTRAMUSCULAR | Status: AC
  Administered 2018-03-05: 400 mg
  Filled 2018-03-05: qty 40

## 2018-03-05 MED ORDER — ROPIVACAINE HCL 2 MG/ML IJ SOLN
2.0000 mL | Freq: Once | INTRAMUSCULAR | Status: AC
  Administered 2018-03-05: 2 mL via EPIDURAL

## 2018-03-05 MED ORDER — SODIUM CHLORIDE 0.9% FLUSH
2.0000 mL | Freq: Once | INTRAVENOUS | Status: AC
  Administered 2018-03-05: 2 mL

## 2018-03-05 MED ORDER — TRIAMCINOLONE ACETONIDE 40 MG/ML IJ SUSP
40.0000 mg | Freq: Once | INTRAMUSCULAR | Status: AC
  Administered 2018-03-05: 40 mg
  Filled 2018-03-05: qty 1

## 2018-03-05 MED ORDER — IOPAMIDOL (ISOVUE-M 200) INJECTION 41%
10.0000 mL | Freq: Once | INTRAMUSCULAR | Status: AC
  Administered 2018-03-05: 10 mL via EPIDURAL
  Filled 2018-03-05: qty 10

## 2018-03-05 NOTE — Patient Instructions (Addendum)
____________________________________________________________________________________________  Post-Procedure Discharge Instructions  Instructions:  Apply ice: Fill a plastic sandwich bag with crushed ice. Cover it with a small towel and apply to injection site. Apply for 15 minutes then remove x 15 minutes. Repeat sequence on day of procedure, until you go to bed. The purpose is to minimize swelling and discomfort after procedure.  Apply heat: Apply heat to procedure site starting the day following the procedure. The purpose is to treat any soreness and discomfort from the procedure.  Food intake: Start with clear liquids (like water) and advance to regular food, as tolerated.   Physical activities: Keep activities to a minimum for the first 8 hours after the procedure.   Driving: If you have received any sedation, you are not allowed to drive for 24 hours after your procedure.  Blood thinner: Restart your blood thinner 6 hours after your procedure. (Only for those taking blood thinners)  Insulin: As soon as you can eat, you may resume your normal dosing schedule. (Only for those taking insulin)  Infection prevention: Keep procedure site clean and dry.  Post-procedure Pain Diary: Extremely important that this be done correctly and accurately. Recorded information will be used to determine the next step in treatment.  Pain evaluated is that of treated area only. Do not include pain from an untreated area.  Complete every hour, on the hour, for the initial 8 hours. Set an alarm to help you do this part accurately.  Do not go to sleep and have it completed later. It will not be accurate.  Follow-up appointment: Keep your follow-up appointment after the procedure. Usually 2 weeks for most procedures. (6 weeks in the case of radiofrequency.) Bring you pain diary.   Expect:  From numbing medicine (AKA: Local Anesthetics): Numbness or decrease in pain.  Onset: Full effect within 15  minutes of injected.  Duration: It will depend on the type of local anesthetic used. On the average, 1 to 8 hours.   From steroids: Decrease in swelling or inflammation. Once inflammation is improved, relief of the pain will follow.  Onset of benefits: Depends on the amount of swelling present. The more swelling, the longer it will take for the benefits to be seen. In some cases, up to 10 days.  Duration: Steroids will stay in the system x 2 weeks. Duration of benefits will depend on multiple posibilities including persistent irritating factors.  From procedure: Some discomfort is to be expected once the numbing medicine wears off. This should be minimal if ice and heat are applied as instructed.  Call if:  You experience numbness and weakness that gets worse with time, as opposed to wearing off.  New onset bowel or bladder incontinence. (This applies to Spinal procedures only)  Emergency Numbers:  Durning business hours (Monday - Thursday, 8:00 AM - 4:00 PM) (Friday, 9:00 AM - 12:00 Noon): [phone]  After hours: [phone] ____________________________________________________________________________________________   Pain Management Discharge Instructions  General Discharge Instructions :  If you need to reach your doctor call: Monday-Friday 8:00 am - 4:00 pm at [phone] or toll free [phone].  After clinic hours [phone] to have operator reach doctor.  Bring all of your medication bottles to all your appointments in the pain clinic.  To cancel or reschedule your appointment with Pain Management please remember to call 24 hours in advance to avoid a fee.  Refer to the educational materials which you have been given on: General Risks, I had my Procedure. Discharge Instructions, Post Sedation.  Post Procedure Instructions:  The drugs you were given will stay in your system until tomorrow, so for the next 24 hours you should not drive, make any legal  decisions or drink any alcoholic beverages.  You may eat anything you prefer, but it is better to start with liquids then soups and crackers, and gradually work up to solid foods.  Please notify your doctor immediately if you have any unusual bleeding, trouble breathing or pain that is not related to your normal pain.  Depending on the type of procedure that was done, some parts of your body may feel week and/or numb.  This usually clears up by tonight or the next day.  Walk with the use of an assistive device or accompanied by an adult for the 24 hours.  You may use ice on the affected area for the first 24 hours.  Put ice in a Ziploc bag and cover with a towel and place against area 15 minutes on 15 minutes off.  You may switch to heat after 24 hours.

## 2018-03-05 NOTE — Progress Notes (Signed)
Patient's Name: Rick LAUFER Sr.  MRN: [id number]  Referring Provider: [facility]  DOB: 1951/08/27  PCP: [facility] [place]  DOS: 03/05/2018  Note by: Gaspar Cola, MD  Service setting: Ambulatory outpatient  Specialty: Interventional Pain Management  Patient type: Established  Location: [facility] (AMB) Pain Management Facility  Visit type: Interventional Procedure   Primary Reason for Visit: Interventional Pain Management Treatment. CC: Back Pain (low)  Procedure:       Anesthesia, Analgesia, Anxiolysis:  Type: Palliative Inter-Laminar Epidural Steroid Injection #1  Region: Lumbar Level: L4-5 Level. Laterality: Right Paramedial  Type: Local Anesthesia Indication(s): Analgesia         Route: Infiltration ([facility]/IM) IV Access: Declined Sedation: Declined  Local Anesthetic: Lidocaine 1-2%   Indications: 1. DDD (degenerative disc disease), lumbar   2. Chronic lumbar radicular pain (L5/S1 dermatome) (Right)   3. 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 Lumbar foraminal stenosis (L5-S1) (Right)   5. Chronic low back pain (Primary Area of Pain) (midline)    Pain Score: Pre-procedure: 5 /10 Post-procedure: 1 /10  Pre-op Assessment:  Rick Carter is a 67 y.o. (year old), male patient, seen today for interventional treatment. He  has a past surgical history that includes Tonsillectomy; Lung removal, partial; and Neck surgery. Rick Carter has a current medication list which includes the following prescription(s): aclidinium bromide, advair diskus, albuterol, aspirin ec, calcium carb-cholecalciferol, cholecalciferol, diphenhydramine, docusate sodium, fluticasone-salmeterol, hydrochlorothiazide, hydroxyzine, ibuprofen, ipratropium-albuterol, loratadine, metformin, methocarbamol, multiple vitamins-minerals, oxycodone-acetaminophen, pantoprazole, pregabalin, sildenafil, sodium chloride hypertonic, trazodone, tudorza pressair, oxycodone-acetaminophen, and oxycodone-acetaminophen. His  primarily concern today is the Back Pain (low)  Initial Vital Signs:  Pulse/HCG Rate: 68  Temp: 98.1 F (36.7 C) Resp: 16 BP: (!) 178/80 SpO2: 98 %  BMI: Estimated body mass index is 21.86 kg/m as calculated from the following:   Height as of this encounter: _0  (1.753 m).   Weight as of this encounter: 148 lb (67.1 kg).  Risk Assessment: Allergies: Reviewed. He is allergic to atorvastatin and varenicline.  Allergy Precautions: None required Coagulopathies: Reviewed. None identified.  Blood-thinner therapy: None at this time Active Infection(s): Reviewed. None identified. Rick Carter is afebrile  Site Confirmation: Rick Carter was asked to confirm the procedure and laterality before marking the site Procedure checklist: Completed Consent: Before the procedure and under the influence of no sedative(s), amnesic(s), or anxiolytics, the patient was informed of the treatment options, risks and possible complications. To fulfill our ethical and legal obligations, as recommended by the American Medical Association's Code of Ethics, I have informed the patient of my clinical impression; the nature and purpose of the treatment or procedure; the risks, benefits, and possible complications of the intervention; the alternatives, including doing nothing; the risk(s) and benefit(s) of the alternative treatment(s) or procedure(s); and the risk(s) and benefit(s) of doing nothing. The patient was provided information about the general risks and possible complications associated with the procedure. These may include, but are not limited to: failure to achieve desired goals, infection, bleeding, organ or nerve damage, allergic reactions, paralysis, and death. In addition, the patient was informed of those risks and complications associated to Spine-related procedures, such as failure to decrease pain; infection (i.e.: Meningitis, epidural or intraspinal abscess); bleeding (i.e.: epidural hematoma,  subarachnoid hemorrhage, or any other type of intraspinal or peri-dural bleeding); organ or nerve damage (i.e.: Any type of peripheral nerve, nerve root, or spinal cord injury) with subsequent damage to sensory, motor, and/or autonomic systems, resulting in permanent pain,  numbness, and/or weakness of one or several areas of the body; allergic reactions; (i.e.: anaphylactic reaction); and/or death. Furthermore, the patient was informed of those risks and complications associated with the medications. These include, but are not limited to: allergic reactions (i.e.: anaphylactic or anaphylactoid reaction(s)); adrenal axis suppression; blood sugar elevation that in diabetics may result in ketoacidosis or comma; water retention that in patients with history of congestive heart failure may result in shortness of breath, pulmonary edema, and decompensation with resultant heart failure; weight gain; swelling or edema; medication-induced neural toxicity; particulate matter embolism and blood vessel occlusion with resultant organ, and/or nervous system infarction; and/or aseptic necrosis of one or more joints. Finally, the patient was informed that Medicine is not an exact science; therefore, there is also the possibility of unforeseen or unpredictable risks and/or possible complications that may result in a catastrophic outcome. The patient indicated having understood very clearly. We have given the patient no guarantees and we have made no promises. Enough time was given to the patient to ask questions, all of which were answered to the patient's satisfaction. Rick Carter has indicated that he wanted to continue with the procedure. Attestation: I, the ordering provider, attest that I have discussed with the patient the benefits, risks, side-effects, alternatives, likelihood of achieving goals, and potential problems during recovery for the procedure that I have provided informed consent. Date  Time: 03/05/2018  1:29  PM  Pre-Procedure Preparation:  Monitoring: As per clinic protocol. Respiration, ETCO2, SpO2, BP, heart rate and rhythm monitor placed and checked for adequate function Safety Precautions: Patient was assessed for positional comfort and pressure points before starting the procedure. Time-out: I initiated and conducted the "Time-out" before starting the procedure, as per protocol. The patient was asked to participate by confirming the accuracy of the "Time Out" information. Verification of the correct person, site, and procedure were performed and confirmed by me, the nursing staff, and the patient. "Time-out" conducted as per Joint Commission's Universal Protocol (UP.01.01.01). Time: 1440  Description of Procedure:       Position: Prone with head of the table was raised to facilitate breathing. Target Area: The interlaminar space, initially targeting the lower laminar border of the superior vertebral body. Approach: Paramedial approach. Area Prepped: Entire Posterior Lumbar Region Prepping solution: ChloraPrep (2% chlorhexidine gluconate and 70% isopropyl alcohol) Safety Precautions: Aspiration looking for blood return was conducted prior to all injections. At no point did we inject any substances, as a needle was being advanced. No attempts were made at seeking any paresthesias. Safe injection practices and needle disposal techniques used. Medications properly checked for expiration dates. SDV (single dose vial) medications used. Description of the Procedure: Protocol guidelines were followed. The procedure needle was introduced through the skin, ipsilateral to the reported pain, and advanced to the target area. Bone was contacted and the needle walked caudad, until the lamina was cleared. The epidural space was identified using "loss-of-resistance technique" with 2-3 ml of PF-NaCl (0.9% NSS), in a 5cc LOR glass syringe. Vitals:   03/05/18 1330 03/05/18 1435 03/05/18 1445 03/05/18 1456  BP: (!)  178/80 (!) 177/101 (!) 178/91 (!) 179/92  Pulse: 68 64 (!) 59 60  Resp: _0 Temp: 98.1 F (36.7 C)     SpO2: 98% 97% 99% 99%  Weight:      Height:        Start Time: 1440 hrs. End Time: 1447 hrs. Materials:  Needle(s) Type: Epidural needle Gauge: 17G Length: 3.5-in Medication(s):  Please see orders for medications and dosing details.  Imaging Guidance (Spinal):  Type of Imaging Technique: Fluoroscopy Guidance (Spinal) Indication(s): Assistance in needle guidance and placement for procedures requiring needle placement in or near specific anatomical locations not easily accessible without such assistance. Exposure Time: Please see nurses notes. Contrast: Before injecting any contrast, we confirmed that the patient did not have an allergy to iodine, shellfish, or radiological contrast. Once satisfactory needle placement was completed at the desired level, radiological contrast was injected. Contrast injected under live fluoroscopy. No contrast complications. See chart for type and volume of contrast used. Fluoroscopic Guidance: I was personally present during the use of fluoroscopy. "Tunnel Vision Technique" used to obtain the best possible view of the target area. Parallax error corrected before commencing the procedure. "Direction-depth-direction" technique used to introduce the needle under continuous pulsed fluoroscopy. Once target was reached, antero-posterior, oblique, and lateral fluoroscopic projection used confirm needle placement in all planes. Images permanently stored in EMR. Interpretation: I personally interpreted the imaging intraoperatively. Adequate needle placement confirmed in multiple planes. Appropriate spread of contrast into desired area was observed. No evidence of afferent or efferent intravascular uptake. No intrathecal or subarachnoid spread observed. Permanent images saved into the patient's record.  Antibiotic Prophylaxis:   Anti-infectives (From  admission, onward)   None     Indication(s): None identified  Post-operative Assessment:  Post-procedure Vital Signs:  Pulse/HCG Rate: 60  Temp: 98.1 F (36.7 C) Resp: 15 BP: (!) 179/92 SpO2: 99 %  EBL: None  Complications: No immediate post-treatment complications observed by team, or reported by patient.  Note: The patient tolerated the entire procedure well. A repeat set of vitals were taken after the procedure and the patient was kept under observation following institutional policy, for this type of procedure. Post-procedural neurological assessment was performed, showing return to baseline, prior to discharge. The patient was provided with post-procedure discharge instructions, including a section on how to identify potential problems. Should any problems arise concerning this procedure, the patient was given instructions to immediately contact us, at any time, without hesitation. In any case, we plan to contact the patient by telephone for a follow-up status report regarding this interventional procedure.  Comments:  No additional relevant information.  Plan of Care   Imaging Orders     DG C-Arm 1-60 Min-No Report  Procedure Orders     Lumbar Epidural Injection     Lumbar Epidural Injection  Medications ordered for procedure: Meds ordered this encounter  Medications  . iopamidol (ISOVUE-M) 41 % intrathecal injection 10 mL    Must be Myelogram-compatible. If not available, you may substitute with a water-soluble, non-ionic, hypoallergenic, myelogram-compatible radiological contrast medium.  Marland Kitchen lidocaine (XYLOCAINE) 2 % (with pres) injection 400 mg  . sodium chloride flush (NS) 0.9 % injection 2 mL  . ropivacaine (PF) 2 mg/mL (0.2%) (NAROPIN) injection 2 mL  . triamcinolone acetonide (KENALOG-40) injection 40 mg   Medications administered: We administered iopamidol, lidocaine, sodium chloride flush, ropivacaine (PF) 2 mg/mL (0.2%), and triamcinolone acetonide.  See the  medical record for exact dosing, route, and time of administration.  New Prescriptions   No medications on file   Disposition: Discharge home  Discharge Date & Time: 03/05/2018; 1451 hrs.   Physician-requested Follow-up: Return for post-procedure eval (2 wks), w/ Dr. Dossie Arbour.  Future Appointments  Date Time Provider [place]  03/26/2018 11:15 AM Vevelyn Francois, NP [facility] None  04/06/2018  1:15 PM Milinda Pointer, MD [facility] None   Primary  Care Physician: [facility] [place] Location: [facility] Outpatient Pain Management Facility Note by: Gaspar Cola, MD Date: 03/05/2018; Time: 3:00 PM  Disclaimer:  Medicine is not an Chief Strategy Officer. The only guarantee in medicine is that nothing is guaranteed. It is important to note that the decision to proceed with this intervention was based on the information collected from the patient. The Data and conclusions were drawn from the patient's questionnaire, the interview, and the physical examination. Because the information was provided in large part by the patient, it cannot be guaranteed that it has not been purposely or unconsciously manipulated. Every effort has been made to obtain as much relevant data as possible for this evaluation. It is important to note that the conclusions that lead to this procedure are derived in large part from the available data. Always take into account that the treatment will also be dependent on availability of resources and existing treatment guidelines, considered by other Pain Management Practitioners as being common knowledge and practice, at the time of the intervention. For Medico-Legal purposes, it is also important to point out that variation in procedural techniques and pharmacological choices are the acceptable norm. The indications, contraindications, technique, and results of the above procedure should only be interpreted and judged by a Board-Certified Interventional Pain Specialist with extensive  familiarity and expertise in the same exact procedure and technique.

## 2018-03-06 ENCOUNTER — Telehealth: Payer: Self-pay

## 2018-03-06 NOTE — Telephone Encounter (Signed)
Post procedure phone call.  Patient states she is doing well.

## 2018-03-26 ENCOUNTER — Ambulatory Visit: Payer: Medicare Other | Attending: Nurse Practitioner | Admitting: Nurse Practitioner

## 2018-03-26 ENCOUNTER — Encounter: Payer: Self-pay | Admitting: Nurse Practitioner

## 2018-03-26 ENCOUNTER — Other Ambulatory Visit: Payer: Self-pay

## 2018-03-26 VITALS — BP 169/81 | HR 90 | Temp 97.9°F | Ht 69.0 in | Wt 146.0 lb

## 2018-03-26 DIAGNOSIS — Z79891 Long term (current) use of opiate analgesic: Secondary | ICD-10-CM

## 2018-03-26 DIAGNOSIS — I70203 Unspecified atherosclerosis of native arteries of extremities, bilateral legs: Secondary | ICD-10-CM | POA: Insufficient documentation

## 2018-03-26 DIAGNOSIS — M47816 Spondylosis without myelopathy or radiculopathy, lumbar region: Secondary | ICD-10-CM

## 2018-03-26 DIAGNOSIS — E785 Hyperlipidemia, unspecified: Secondary | ICD-10-CM | POA: Insufficient documentation

## 2018-03-26 DIAGNOSIS — M797 Fibromyalgia: Secondary | ICD-10-CM

## 2018-03-26 DIAGNOSIS — M5116 Intervertebral disc disorders with radiculopathy, lumbar region: Secondary | ICD-10-CM | POA: Insufficient documentation

## 2018-03-26 DIAGNOSIS — G8929 Other chronic pain: Secondary | ICD-10-CM | POA: Diagnosis present

## 2018-03-26 DIAGNOSIS — J449 Chronic obstructive pulmonary disease, unspecified: Secondary | ICD-10-CM | POA: Diagnosis not present

## 2018-03-26 DIAGNOSIS — M7918 Myalgia, other site: Secondary | ICD-10-CM | POA: Diagnosis not present

## 2018-03-26 DIAGNOSIS — M47812 Spondylosis without myelopathy or radiculopathy, cervical region: Secondary | ICD-10-CM | POA: Diagnosis not present

## 2018-03-26 DIAGNOSIS — M65342 Trigger finger, left ring finger: Secondary | ICD-10-CM | POA: Diagnosis not present

## 2018-03-26 DIAGNOSIS — M4726 Other spondylosis with radiculopathy, lumbar region: Secondary | ICD-10-CM | POA: Insufficient documentation

## 2018-03-26 DIAGNOSIS — G8912 Acute post-thoracotomy pain: Secondary | ICD-10-CM | POA: Insufficient documentation

## 2018-03-26 DIAGNOSIS — M545 Low back pain: Secondary | ICD-10-CM | POA: Diagnosis present

## 2018-03-26 DIAGNOSIS — M792 Neuralgia and neuritis, unspecified: Secondary | ICD-10-CM | POA: Diagnosis not present

## 2018-03-26 DIAGNOSIS — I10 Essential (primary) hypertension: Secondary | ICD-10-CM | POA: Diagnosis not present

## 2018-03-26 DIAGNOSIS — G894 Chronic pain syndrome: Secondary | ICD-10-CM | POA: Diagnosis not present

## 2018-03-26 DIAGNOSIS — F319 Bipolar disorder, unspecified: Secondary | ICD-10-CM | POA: Diagnosis not present

## 2018-03-26 DIAGNOSIS — K219 Gastro-esophageal reflux disease without esophagitis: Secondary | ICD-10-CM | POA: Diagnosis not present

## 2018-03-26 DIAGNOSIS — M5030 Other cervical disc degeneration, unspecified cervical region: Secondary | ICD-10-CM | POA: Insufficient documentation

## 2018-03-26 DIAGNOSIS — M79602 Pain in left arm: Secondary | ICD-10-CM | POA: Insufficient documentation

## 2018-03-26 MED ORDER — METHOCARBAMOL 750 MG PO TABS: 750.0000 mg | ORAL_TABLET | Freq: Three times a day (TID) | ORAL | 2 refills | Status: DC | PRN

## 2018-03-26 MED ORDER — PREGABALIN 150 MG PO CAPS: 150.0000 mg | ORAL_CAPSULE | Freq: Two times a day (BID) | ORAL | 0 refills | Status: DC

## 2018-03-26 MED ORDER — OXYCODONE-ACETAMINOPHEN 5-325 MG PO TABS: 1.0000 | ORAL_TABLET | Freq: Four times a day (QID) | ORAL | 0 refills | Status: DC | PRN

## 2018-03-26 NOTE — Patient Instructions (Addendum)
__You have been given prescription for Percocet and Lyrica in clinic today. Robaxon 733m sent to your pharmacy. __________________________________________________________________________________________  Medication Rules  Applies to: All patients receiving prescriptions (written or electronic).  Pharmacy of record: Pharmacy where electronic prescriptions will be sent. If written prescriptions are taken to a different pharmacy, please inform the nursing staff. The pharmacy listed in the electronic medical record should be the one where you would like electronic prescriptions to be sent.  Prescription refills: Only during scheduled appointments. Applies to both, written and electronic prescriptions.  NOTE: The following applies primarily to controlled substances (Opioid* Pain Medications).   Patient's responsibilities: 1. Pain Pills: Bring all pain pills to every appointment (except for procedure appointments). 2. Pill Bottles: Bring pills in original pharmacy bottle. Always bring newest bottle. Bring bottle, even if empty. 3. Medication refills: You are responsible for knowing and keeping track of what medications you need refilled. The day before your appointment, write a list of all prescriptions that need to be refilled. Bring that list to your appointment and give it to the admitting nurse. Prescriptions will be written only during appointments. If you forget a medication, it will not be "Called in", "Faxed", or "electronically sent". You will need to get another appointment to get these prescribed. 4. Prescription Accuracy: You are responsible for carefully inspecting your prescriptions before leaving our office. Have the discharge nurse carefully go over each prescription with you, before taking them home. Make sure that your name is accurately spelled, that your address is correct. Check the name and dose of your medication to make sure it is accurate. Check the number of pills, and the  written instructions to make sure they are clear and accurate. Make sure that you are given enough medication to last until your next medication refill appointment. 5. Taking Medication: Take medication as prescribed. Never take more pills than instructed. Never take medication more frequently than prescribed. Taking less pills or less frequently is permitted and encouraged, when it comes to controlled substances (written prescriptions).  6. Inform other Doctors: Always inform, all of your healthcare providers, of all the medications you take. 7. Pain Medication from other Providers: You are not allowed to accept any additional pain medication from any other Doctor or Healthcare provider. There are two exceptions to this rule. (see below) In the event that you require additional pain medication, you are responsible for notifying [place] as stated below. 8. Medication Agreement: You are responsible for carefully reading and following our Medication Agreement. This must be signed before receiving any prescriptions from our practice. Safely store a copy of your signed Agreement. Violations to the Agreement will result in no further prescriptions. (Additional copies of our Medication Agreement are available upon request.) 9. Laws, Rules, & Regulations: All patients are expected to follow all Federal and [employer] Rules, &[facility] Ignorance of the Laws does not constitute a valid excuse. The use of any illegal substances is prohibited. 10. Adopted CDC guidelines & recommendations: Target dosing levels will be at or below 60 MME/day. Use of benzodiazepines** is not recommended.  Exceptions: There are only two exceptions to the rule of not receiving pain medications from other Healthcare Providers. 1. Exception #1 (Emergencies): In the event of an emergency (i.e.: accident requiring emergency care), you are allowed to receive additional pain medication. However, you are responsible for: As soon as you  are able, call our office [phone], at any time of the day or night, and leave a message  stating your name, the date and nature of the emergency, and the name and dose of the medication prescribed. In the event that your call is answered by a member of our staff, make sure to document and save the date, time, and the name of the person that took your information.  2. Exception #2 (Planned Surgery): In the event that you are scheduled by another doctor or dentist to have any type of surgery or procedure, you are allowed (for a period no longer than 30 days), to receive additional pain medication, for the acute post-op pain. However, in this case, you are responsible for picking up a copy of our "Post-op Pain Management for Surgeons" handout, and giving it to your surgeon or dentist. This document is available at our office, and does not require an appointment to obtain it. Simply go to our office during business hours (Monday-Thursday from 8:00 AM to 4:00 PM) (Friday 8:00 AM to 12:00 Noon) or if you have a scheduled appointment with [place], prior to your surgery, and ask for it by name. In addition, you will need to provide [place] with your name, name of your surgeon, type of surgery, and date of procedure or surgery.  *Opioid medications include: morphine, codeine, oxycodone, oxymorphone, hydrocodone, hydromorphone, meperidine, tramadol, tapentadol, buprenorphine, fentanyl, methadone. **Benzodiazepine medications include: diazepam (Valium), alprazolam (Xanax), clonazepam (Klonopine), lorazepam (Ativan), clorazepate (Tranxene), chlordiazepoxide (Librium), estazolam (Prosom), oxazepam (Serax), temazepam (Restoril), triazolam (Halcion) (Last updated: 12/11/2017) ____________________________________________________________________________________________

## 2018-03-26 NOTE — Progress Notes (Signed)
Patient's Name: Rick RAKESTRAW Sr.  MRN: [id number]  Referring Provider: [place] [place]  DOB: 03-09-1951  PCP: [facility]  DOS: 03/26/2018  Note by: Vevelyn Francois NP  Service setting: Ambulatory outpatient  Specialty: Interventional Pain Management  Location: [facility] (AMB) Pain Management Facility    Patient type: Established    Primary Reason(s) for Visit: Encounter for prescription drug management. (Level of risk: moderate)  CC: Back Pain (low)  HPI  Rick Carter is a 67 y.o. year old, male patient, who comes today for a medication management evaluation. 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[place]); Atrial fibrillation ([place]); Bipolar affective disorder ([place]); Chronic obstructive pulmonary disease (HCC); DDD (degenerative disc disease), cervical; Colon, diverticulosis; Acid reflux; Essential (primary) hypertension; HLD (hyperlipidemia); Breath shortness; Compulsive tobacco user syndrome; Diverticular disease of large intestine; Post-thoracotomy pain syndrome; Encounter for therapeutic drug level monitoring; Long term current use of opiate analgesic; Long term prescription opiate use; Uncomplicated opioid dependence ([place]); Opiate use (30 MME/Day); Fibromyalgia; Chronic pain syndrome; Lumbar facet syndrome (Bilateral) (R>L); History of alcoholism ([place]); Chronic low back pain (Primary Area of Pain) (midline); Lumbar spondylosis; Cervical spondylosis; Failed cervical surgery syndrome; Nicotine dependence; Tobacco abuse; Substance Use Disorder Risk: High; History of right-sided partial pneumonectomy; Atherosclerotic peripheral vascular disease ([place]); Musculoskeletal pain; Neurogenic pain; Neuropathic pain; Opioid-induced constipation (OIC); Osteoarthritis; NSAID induced gastritis; Thoracic spine pain; Thoracic radicular pain (Right); Chronic lumbar radicular pain (L5/S1 dermatome) (Right); Lumbar foraminal stenosis (L5-S1) (Right); Lumbar facet arthropathy (Bilateral) (R>L); Lumbar disc  protrusion (L3-4) (Right); Chronic cervical radicular pain (Left); Tumor of parotid gland; Trigger finger, left ring finger; Disturbance of skin sensation; Pain of lumbar spine (L2-3 interspinous ligament); Chronic lower extremity pain (Secondary Area of Pain) (Right); DDD (degenerative disc disease), lumbar; Failed back surgical syndrome; Chronic upper extremity pain (Left); and Pleomorphic adenoma of parotid gland on their problem list. His primarily concern today is the Back Pain (low)  Pain Assessment: Location: Lower Back Radiating: radiates down right leg to top of foot Onset: More than a month ago Duration: Chronic pain Quality: Nagging, Sharp Severity: 4 /10 (subjective, self-reported pain score)  Note: Reported level is compatible with observation.                          Timing: Constant Modifying factors: lying down BP: (!) 169/81  HR: 90  Rick Carter was last scheduled for an appointment on 12/24/2017 for medication management. During today's appointment we reviewed Rick Carter chronic pain status, as well as his outpatient medication regimen. He admits that he continues to have a lot of pain in his guitar finger. He states that it draws up on him. He has not been able to play. He is under increased stress with having to care for his girlfriend. He admits that he has not help and she has dementia but does not want in one in the home to assist.   The patient  has no drug history on file. His body mass index is 21.56 kg/m.  Further details on both, my assessment(s), as well as the proposed treatment plan, please see below.  Controlled Substance Pharmacotherapy Assessment REMS (Risk Evaluation and Mitigation Strategy)  Analgesic:Oxycodone/APAP 5/325 one every 6 hours (20 mg/day) MME/day:30 mg/day     Dewayne Shorter, RN  03/26/2018 11:19 AM  Signed Nursing Pain Medication Assessment:  Safety precautions to be maintained throughout the outpatient stay will include: orient to  surroundings, keep bed in  low position, maintain call bell within reach at all times, provide assistance with transfer out of bed and ambulation.  Medication Inspection Compliance: Pill count conducted under aseptic conditions, in front of the patient. Neither the pills nor the bottle was removed from the patient's sight at any time. Once count was completed pills were immediately returned to the patient in their original bottle.  Medication: Hydrocodone/APAP Pill/Patch Count: 30 of 120 pills remain Pill/Patch Appearance: Markings consistent with prescribed medication Bottle Appearance: Standard pharmacy container. Clearly labeled. Filled Date05 / 22/ 2019 Last Medication intake:  Today   Pharmacokinetics: Liberation and absorption (onset of action): WNL Distribution (time to peak effect): WNL Metabolism and excretion (duration of action): WNL         Pharmacodynamics: Desired effects: Analgesia: Rick Carter reports >50% benefit. Functional ability: Patient reports that medication allows him to accomplish basic ADLs Clinically meaningful improvement in function (CMIF): Sustained CMIF goals met Perceived effectiveness: Described as relatively effective, allowing for increase in activities of daily living (ADL) Undesirable effects: Side-effects or Adverse reactions: None reported Monitoring: [place] PMP: Online review of the past 16-monthperiod conducted. Compliant with practice rules and regulations Last UDS on record: Summary  Date Value Ref Range Status  06/26/2017 FINAL  Final    Comment:    ==================================================================== TOXASSURE SELECT 13 (MW) ==================================================================== Test                             Result       Flag       Units Drug Present and Declared for Prescription Verification   Oxycodone                      678          EXPECTED   ng/mg creat   Oxymorphone                    2414          EXPECTED   ng/mg creat   Noroxycodone                   1869         EXPECTED   ng/mg creat   Noroxymorphone                 668          EXPECTED   ng/mg creat    Sources of oxycodone are scheduled prescription medications.    Oxymorphone, noroxycodone, and noroxymorphone are expected    metabolites of oxycodone. Oxymorphone is also available as a    scheduled prescription medication. ==================================================================== Test                      Result    Flag   Units      Ref Range   Creatinine              59               mg/dL      >=20 ==================================================================== Declared Medications:  The flagging and interpretation on this report are based on the  following declared medications.  Unexpected results may arise from  inaccuracies in the declared medications.  **Note: The testing scope of this panel includes these medications:  Oxycodone (Percocet)  Oxycodone (Roxicet)  **Note: The testing scope of this panel does not include following  reported  medications:  Acetaminophen (Percocet)  Acetaminophen (Roxicet)  Aclidinium  Albuterol  Albuterol (Duoneb)  Aspirin (Aspirin 81)  Calcium Carbonate (Calcium Carb)  Cholecalciferol  Diphenhydramine (Benadryl)  Docusate (Colace)  Fluticasone (Advair)  Hydrochlorothiazide (Microzide)  Hydroxyzine  Ibuprofen  Ipratropium (Duoneb)  Loratadine (Claritin)  Metformin  Methocarbamol (Robaxin)  Multivitamin (MVI)  Pantoprazole (Protonix)  Pregabalin (Lyrica)  Salmeterol (Advair)  Sildenafil (Viagra)  Sodium Chloride  Trazodone  Umeclidinium (Incruse Ellipta)  Vitamin D ==================================================================== For clinical consultation, please call [phone]. ====================================================================    UDS interpretation: Compliant          Medication Assessment Form: Reviewed. Patient indicates  being compliant with therapy Treatment compliance: Compliant Risk Assessment Profile: Aberrant behavior: See prior evaluations. None observed or detected today Comorbid factors increasing risk of overdose: See prior notes. No additional risks detected today Risk of substance use disorder (SUD): Low Opioid Risk Tool - 03/05/18 1335      Family History of Substance Abuse   Alcohol  Positive Male    Illegal Drugs  Negative    Rx Drugs  Negative      Personal History of Substance Abuse   Alcohol  Positive Male or Male    Illegal Drugs  Negative    Rx Drugs  Negative      Age   Age between 25-45 years   No      History of Preadolescent Sexual Abuse   History of Preadolescent Sexual Abuse  Negative or Male      Psychological Disease   Psychological Disease  Positive    Schizophrenia  Positive    Depression  Positive      Total Score   Opioid Risk Tool Scoring  9    Opioid Risk Interpretation  High Risk      ORT Scoring interpretation table:  Score <3 = Low Risk for SUD  Score between 4-7 = Moderate Risk for SUD  Score >8 = High Risk for Opioid Abuse   Risk Mitigation Strategies:  Patient Counseling: Covered Patient-Prescriber Agreement (PPA): Present and active  Notification to other healthcare providers: Done  Pharmacologic Plan: No change in therapy, at this time.            Post-Procedure Assessment  03/05/2018 Procedure: Palliative Inter-Laminar Epidural Steroid Injection #1  Pre-procedure pain score:  5/10 Post-procedure pain score: 1/10         Influential Factors: BMI: 21.56 kg/m Intra-procedural challenges: None observed.         Assessment challenges: None detected.              Reported side-effects: None.        Post-procedural adverse reactions or complications: None reported         Sedation: Please see nurses note. When no sedatives are used, the analgesic levels obtained are directly associated to the effectiveness of the local anesthetics. However,  when sedation is provided, the level of analgesia obtained during the initial 1 hour following the intervention, is believed to be the result of a combination of factors. These factors may include, but are not limited to: 1. The effectiveness of the local anesthetics used. 2. The effects of the analgesic(s) and/or anxiolytic(s) used. 3. The degree of discomfort experienced by the patient at the time of the procedure. 4. The patients ability and reliability in recalling and recording the events. 5. The presence and influence of possible secondary gains and/or psychosocial factors. Reported result: Relief experienced during the 1st hour after the procedure:  100 % (Ultra-Short Term Relief)            Interpretative annotation: Clinically appropriate result. Analgesia during this period is likely to be Local Anesthetic and/or IV Sedative (Analgesic/Anxiolytic) related.          Effects of local anesthetic: The analgesic effects attained during this period are directly associated to the localized infiltration of local anesthetics and therefore cary significant diagnostic value as to the etiological location, or anatomical origin, of the pain. Expected duration of relief is directly dependent on the pharmacodynamics of the local anesthetic used. Long-acting (4-6 hours) anesthetics used.  Reported result: Relief during the next 4 to 6 hour after the procedure: 100 % (Short-Term Relief)            Interpretative annotation: Clinically appropriate result. Analgesia during this period is likely to be Local Anesthetic-related.          Long-term benefit: Defined as the period of time past the expected duration of local anesthetics (1 hour for short-acting and 4-6 hours for long-acting). With the possible exception of prolonged sympathetic blockade from the local anesthetics, benefits during this period are typically attributed to, or associated with, other factors such as analgesic sensory neuropraxia,  antiinflammatory effects, or beneficial biochemical changes provided by agents other than the local anesthetics.  Reported result: Extended relief following procedure: 80 %(lasted 10 days) (Long-Term Relief)            Interpretative annotation: Clinically appropriate result. Good relief. No permanent benefit expected. Inflammation plays a part in the etiology to the pain.          Current benefits: Defined as reported results that persistent at this point in time.   Analgesia: 75-100 %            Function: Mr. Shaul reports improvement in function ROM: Mr. Carrigan reports improvement in ROM Interpretative annotation: Ongoing benefit.    Effective therapeutic approach.          Interpretation: Results would suggest this therapy to be effective in the management of Mr. Perz's condition.                  Plan:  Please see "Plan of Care" for details.                Laboratory Chemistry  Inflammation Markers (CRP: Acute Phase) (ESR: Chronic Phase) Lab Results  Component Value Date   CRP <0.8 01/06/2017   ESRSEDRATE 5 01/06/2017                         Rheumatology Markers No results found for: RF, ANA, LABURIC, URICUR, LYMEIGGIGMAB, LYMEABIGMQN, HLAB27                      Renal Function Markers Lab Results  Component Value Date   BUN 12 01/06/2017   CREATININE 0.79 01/06/2017   GFRAA >60 01/06/2017   GFRNONAA >60 01/06/2017                             Hepatic Function Markers Lab Results  Component Value Date   AST 30 01/06/2017   ALT 24 01/06/2017   ALBUMIN 4.2 01/06/2017   ALKPHOS 53 01/06/2017                        Electrolytes Lab Results  Component Value Date  NA 142 01/06/2017   K 4.0 01/06/2017   CL 107 01/06/2017   CALCIUM 9.7 01/06/2017   MG 2.2 01/06/2017                        Neuropathy Markers Lab Results  Component Value Date   VITAMINB12 637 01/06/2017                        Bone Pathology Markers Lab Results  Component Value Date    25OHVITD1 58 01/06/2017   25OHVITD2 <1.0 01/06/2017   25OHVITD3 58 01/06/2017                         Coagulation Parameters No results found for: INR, LABPROT, APTT, PLT, DDIMER                      Cardiovascular Markers No results found for: BNP, CKTOTAL, CKMB, TROPONINI, HGB, HCT                       CA Markers No results found for: CEA, CA125, LABCA2                      Note: Lab results reviewed.  Recent Diagnostic Imaging Results  DG C-Arm 1-60 Min-No Report Fluoroscopy was utilized by the requesting physician.  No radiographic  interpretation.   Complexity Note: Imaging results reviewed. Results shared with Mr. Tidwell, using Layman's terms.                         Meds   Current Outpatient Medications:  .  Aclidinium Bromide (TUDORZA PRESSAIR IN), Inhale 40 mcg into the lungs 2 (two) times daily., Disp: , Rfl:  .  ADVAIR DISKUS 250-50 MCG/DOSE AEPB, INHALE 1 PUFF 2 (TWO) TIMES A DAY., Disp: , Rfl: 11 .  albuterol (PROVENTIL) (2.5 MG/3ML) 0.083% nebulizer solution, INHALE 0.5 ML (2.5 MG TOTAL) BY NEBULIZATION EVERY SIX (6) HOURS AS NEEDED FOR WHEEZING., Disp: , Rfl: 11 .  aspirin EC 81 MG tablet, Take 81 mg by mouth daily., Disp: , Rfl:  .  Calcium Carb-Cholecalciferol 600-800 MG-UNIT TABS, TAKE 1 TABLET BY MOUTH 2 TIMES A DAY, Disp: , Rfl:  .  cholecalciferol (VITAMIN D) 1000 UNITS tablet, Take 2,000 Units by mouth 2 (two) times daily. , Disp: , Rfl:  .  diphenhydrAMINE (BENADRYL) 25 mg capsule, Take 25 mg by mouth 2 (two) times daily. , Disp: , Rfl:  .  docusate sodium (COLACE) 100 MG capsule, Take 1 capsule (100 mg total) by mouth daily., Disp: 90 capsule, Rfl: 0 .  Fluticasone-Salmeterol (ADVAIR) 500-50 MCG/DOSE AEPB, Inhale 1 puff into the lungs 2 (two) times daily., Disp: , Rfl:  .  hydrochlorothiazide (MICROZIDE) 12.5 MG capsule, TAKE ONE CAPSULE BY MOUTH FOR HIGH BLOOD PRESSURE, Disp: , Rfl: 3 .  hydrOXYzine (ATARAX/VISTARIL) 25 MG tablet, Take 25 mg by mouth as  needed., Disp: , Rfl:  .  ibuprofen (ADVIL,MOTRIN) 800 MG tablet, Take 1 tablet (800 mg total) by mouth every 8 (eight) hours as needed for moderate pain., Disp: 270 tablet, Rfl: 0 .  ipratropium-albuterol (DUONEB) 0.5-2.5 (3) MG/3ML SOLN, Take 3 mLs by nebulization every 6 (six) hours as needed., Disp: , Rfl:  .  loratadine (CLARITIN) 10 MG tablet, Take 10 mg by mouth. , Disp: , Rfl:  .  metFORMIN (GLUCOPHAGE) 500 MG tablet, Take 500 mg by mouth 2 (two) times daily with a meal., Disp: , Rfl:  .  metFORMIN (GLUCOPHAGE-XR) 500 MG 24 hr tablet, TAKE 1 TABLET BY MOUTH EVERY DAY FOR DIABETES, Disp: , Rfl: 3 .  [START ON 04/03/2018] methocarbamol (ROBAXIN) 750 MG tablet, Take 1 tablet (750 mg total) by mouth every 8 (eight) hours as needed for muscle spasms., Disp: 90 tablet, Rfl: 2 .  Multiple Vitamins-Minerals (PRESERVISION AREDS 2 PO), Take 1 tablet by mouth 2 (two) times daily., Disp: , Rfl:  .  [START ON 05/03/2018] oxyCODONE-acetaminophen (PERCOCET/ROXICET) 5-325 MG tablet, Take 1 tablet by mouth every 6 (six) hours as needed for severe pain., Disp: 120 tablet, Rfl: 0 .  pantoprazole (PROTONIX) 40 MG tablet, Take 40 mg by mouth., Disp: , Rfl:  .  pregabalin (LYRICA) 150 MG capsule, Take 1 capsule (150 mg total) by mouth 2 (two) times daily., Disp: 180 capsule, Rfl: 0 .  sildenafil (VIAGRA) 100 MG tablet, 100 mg., Disp: , Rfl:  .  sodium chloride HYPERTONIC 3 % nebulizer solution, INHALE 4 ML BY NEBULIZATION TWICE A DAY, Disp: , Rfl: 6 .  traZODone (DESYREL) 50 MG tablet, Take 50 mg by mouth at bedtime. Reported on 02/20/2016, Disp: , Rfl:  .  TUDORZA PRESSAIR 400 MCG/ACT AEPB, Inhale 1 puff into the lungs 2 (two) times daily., Disp: , Rfl: 11 .  VENTOLIN HFA 108 (90 Base) MCG/ACT inhaler, INHALE 2 PUFFS EVERY FOUR (4) HOURS AS NEEDED FOR WHEEZING, Disp: , Rfl: 11 .  [START ON 06/02/2018] oxyCODONE-acetaminophen (PERCOCET/ROXICET) 5-325 MG tablet, Take 1 tablet by mouth every 6 (six) hours as needed for  severe pain., Disp: 120 tablet, Rfl: 0 .  [START ON 04/03/2018] oxyCODONE-acetaminophen (PERCOCET/ROXICET) 5-325 MG tablet, Take 1 tablet by mouth every 6 (six) hours as needed for severe pain., Disp: 120 tablet, Rfl: 0  ROS  Constitutional: Denies any fever or chills Gastrointestinal: No reported hemesis, hematochezia, vomiting, or acute GI distress Musculoskeletal: Denies any acute onset joint swelling, redness, loss of ROM, or weakness Neurological: No reported episodes of acute onset apraxia, aphasia, dysarthria, agnosia, amnesia, paralysis, loss of coordination, or loss of consciousness  Allergies  Mr. Handler is allergic to atorvastatin and varenicline.  PFSH  Drug: Mr. Menges  has no drug history on file. Alcohol:  reports that he does not drink alcohol. Tobacco:  reports that he has been smoking cigarettes.  He has a 51.00 pack-year smoking history. He has never used smokeless tobacco. Medical:  has a past medical history of Anxiety, COPD (chronic obstructive pulmonary disease) ([place]), Depression, Hernia of abdominal wall, History of alcoholism ([place]) (08/08/2015), History of pneumonectomy (08/08/2015), Hypertension, Macular degeneration (08/26/2016), and Tumor cells. Surgical: Mr. Bail  has a past surgical history that includes Tonsillectomy; Lung removal, partial; and Neck surgery. Family: family history includes Alcohol abuse in his father; Kidney disease in his mother.  Constitutional Exam  General appearance: Well nourished, well developed, and well hydrated. In no apparent acute distress Vitals:   03/26/18 1112  BP: (!) 169/81  Pulse: 90  Temp: 97.9 F (36.6 C)  SpO2: 96%  Weight: 146 lb (66.2 kg)  Height: _0  (1.753 m)  Psych/Mental status: Alert, oriented x 3 (person, place, & time)       Eyes: PERLA Respiratory: wheezing  Cervical Spine Area Exam  Skin & Axial Inspection: No masses, redness, edema, swelling, or associated skin lesions Alignment:  Symmetrical Functional ROM: Unrestricted ROM  Stability: No instability detected Muscle Tone/Strength: Functionally intact. No obvious neuro-muscular anomalies detected. Sensory (Neurological): Unimpaired Palpation: No palpable anomalies              Upper Extremity (UE) Exam    Side: Right upper extremity  Side: Left upper extremity  Skin & Extremity Inspection: Skin color, temperature, and hair growth are WNL. No peripheral edema or cyanosis. No masses, redness, swelling, asymmetry, or associated skin lesions. No contractures.  Skin & Extremity Inspection: Skin color, temperature, and hair growth are WNL. No peripheral edema or cyanosis. No masses, redness, swelling, asymmetry, or associated skin lesions. No contractures.  Functional ROM: Unrestricted ROM          Functional ROM: Adequate ROM for hand  Muscle Tone/Strength: Functionally intact. No obvious neuro-muscular anomalies detected.  Muscle Tone/Strength: Functionally intact. No obvious neuro-muscular anomalies detected.  Sensory (Neurological): Unimpaired          Sensory (Neurological): Unimpaired          Palpation: No palpable anomalies              Palpation: No palpable anomalies              Provocative Test(s):  Phalen's test: deferred Tinel's test: deferred Apley's scratch test (touch opposite shoulder):  Action 1 (Across chest): deferred Action 2 (Overhead): deferred Action 3 (LB reach): deferred   Provocative Test(s):  Phalen's test: deferred Tinel's test: deferred Apley's scratch test (touch opposite shoulder):  Action 1 (Across chest): deferred Action 2 (Overhead): deferred Action 3 (LB reach): deferred    Thoracic Spine Area Exam  Skin & Axial Inspection: No masses, redness, or swelling Alignment: Symmetrical Functional ROM: Unrestricted ROM Stability: No instability detected Muscle Tone/Strength: Functionally intact. No obvious neuro-muscular anomalies detected. Sensory (Neurological):  Unimpaired Muscle strength & Tone: No palpable anomalies  Lumbar Spine Area Exam  Skin & Axial Inspection: No masses, redness, or swelling Alignment: Symmetrical Functional ROM: Unrestricted ROM       Stability: No instability detected Muscle Tone/Strength: Functionally intact. No obvious neuro-muscular anomalies detected. Sensory (Neurological): Unimpaired Palpation: No palpable anomalies       Provocative Tests: Lumbar Hyperextension/rotation test: deferred today       Lumbar quadrant test (Kemp's test): deferred today       Lumbar Lateral bending test: deferred today       Patrick's Maneuver: deferred today                   FABER test: deferred today       Thigh-thrust test: deferred today       S-I compression test: deferred today       S-I distraction test: deferred today        Gait & Posture Assessment  Ambulation: Unassisted Gait: Relatively normal for age and body habitus Posture: WNL   Lower Extremity Exam    Side: Right lower extremity  Side: Left lower extremity  Stability: No instability observed          Stability: No instability observed          Skin & Extremity Inspection: Skin color, temperature, and hair growth are WNL. No peripheral edema or cyanosis. No masses, redness, swelling, asymmetry, or associated skin lesions. No contractures.  Skin & Extremity Inspection: Skin color, temperature, and hair growth are WNL. No peripheral edema or cyanosis. No masses, redness, swelling, asymmetry, or associated skin lesions. No contractures.  Functional ROM: Unrestricted ROM  Functional ROM: Unrestricted ROM                  Muscle Tone/Strength: Functionally intact. No obvious neuro-muscular anomalies detected.  Muscle Tone/Strength: Functionally intact. No obvious neuro-muscular anomalies detected.  Sensory (Neurological): Unimpaired  Sensory (Neurological): Unimpaired  Palpation: No palpable anomalies  Palpation: No palpable anomalies   Assessment   Primary Diagnosis & Pertinent Problem List: The primary encounter diagnosis was Lumbar spondylosis. Diagnoses of Fibromyalgia, Trigger finger, left ring finger, Musculoskeletal pain, Chronic pain syndrome, Neurogenic pain, Neuropathic pain, and Long term prescription opiate use were also pertinent to this visit.  Status Diagnosis  Controlled Controlled Worsening 1. Lumbar spondylosis   2. Fibromyalgia   3. Trigger finger, left ring finger   4. Musculoskeletal pain   5. Chronic pain syndrome   6. Neurogenic pain   7. Neuropathic pain   8. Long term prescription opiate use     Problems updated and reviewed during this visit: Problem  Pleomorphic Adenoma of Parotid Gland   Added automatically from request for surgery [id number]    Plan of Care  Pharmacotherapy (Medications Ordered): Meds ordered this encounter  Medications  . oxyCODONE-acetaminophen (PERCOCET/ROXICET) 5-325 MG tablet    Sig: Take 1 tablet by mouth every 6 (six) hours as needed for severe pain.    Dispense:  120 tablet    Refill:  0    Do not place this medication, or any other prescription from our practice, on "Automatic Refill". Patient may have prescription filled one day early if pharmacy is closed on scheduled refill date. Do not fill until: 06/02/2018 To last until:07/02/2018    Order Specific Question:   Supervising Provider    Answer:   Milinda Pointer [phone]  . oxyCODONE-acetaminophen (PERCOCET/ROXICET) 5-325 MG tablet    Sig: Take 1 tablet by mouth every 6 (six) hours as needed for severe pain.    Dispense:  120 tablet    Refill:  0    Do not place this medication, or any other prescription from our practice, on "Automatic Refill". Patient may have prescription filled one day early if pharmacy is closed on scheduled refill date. Do not fill until:05/03/2018 To last until: 06/02/2018    Order Specific Question:   Supervising Provider    Answer:   Milinda Pointer [phone]  . oxyCODONE-acetaminophen  (PERCOCET/ROXICET) 5-325 MG tablet    Sig: Take 1 tablet by mouth every 6 (six) hours as needed for severe pain.    Dispense:  120 tablet    Refill:  0    Do not place this medication, or any other prescription from our practice, on "Automatic Refill". Patient may have prescription filled one day early if pharmacy is closed on scheduled refill date. Do not fill until: 04/03/2018 To last until:05/03/2018    Order Specific Question:   Supervising Provider    Answer:   Milinda Pointer [phone]  . methocarbamol (ROBAXIN) 750 MG tablet    Sig: Take 1 tablet (750 mg total) by mouth every 8 (eight) hours as needed for muscle spasms.    Dispense:  90 tablet    Refill:  2    Do not place this medication, or any other prescription from our practice, on "Automatic Refill". Patient may have prescription filled one day early if pharmacy is closed on scheduled refill date.    Order Specific Question:   Supervising Provider    Answer:   Milinda Pointer [phone]  . pregabalin (LYRICA) 150 MG capsule  Sig: Take 1 capsule (150 mg total) by mouth 2 (two) times daily.    Dispense:  180 capsule    Refill:  0    Do not place this medication, or any other prescription from our practice, on "Automatic Refill". Patient may have prescription filled one day early if pharmacy is closed on scheduled refill date.    Order Specific Question:   Supervising Provider    Answer:   Milinda Pointer [[id number]]   New Prescriptions   No medications on file   Medications administered today: Sr. Pat Patrick Sr. had no medications administered during this visit. Lab-work, procedure(s), and/or referral(s): Orders Placed This Encounter  Procedures  . Misc procedure  . ToxASSURE Select 13 (MW), Urine   Imaging and/or referral(s): None  Interventional therapies: Planned, scheduled, and/or pending:  Left trigger finger digit 3    Considering:  Palliative bilateral lumbar facet block Palliative left-sided  cervical epiduralsteroid injection  Palliative right-sided L5-S1 lumbar epiduralsteroid injection  Diagnostic bilateral cervical facet block Palliative Right-sided T8-9 thoracic epiduralsteroid injection    Palliative PRN treatment(s):  Palliativebilateral lumbar facet block Palliative left-sided cervical epidural steroid injection  Palliative right-sided L5-S1 lumbar epiduralsteroid injection      Provider-requested follow-up: Return in about 3 months (around 06/26/2018) for MedMgmt with Me Dionisio David), Procedure(NS), w/ Dr. Dossie Arbour, (ASAP).  Future Appointments  Date Time Provider [place]  04/06/2018  1:15 PM Milinda Pointer, MD [facility] None   Primary Care Physician: [place] Location: [facility] Facility Note by: Vevelyn Francois NP Date: 03/26/2018; Time: 4:17 PM  Pain Score Disclaimer: We use the NRS-11 scale. This is a self-reported, subjective measurement of pain severity with only modest accuracy. It is used primarily to identify changes within a particular patient. It must be understood that outpatient pain scales are significantly less accurate that those used for research, where they can be applied under ideal controlled circumstances with minimal exposure to variables. In reality, the score is likely to be a combination of pain intensity and pain affect, where pain affect describes the degree of emotional arousal or changes in action readiness caused by the sensory experience of pain. Factors such as social and work situation, setting, emotional state, anxiety levels, expectation, and prior pain experience may influence pain perception and show large inter-individual differences that may also be affected by time variables.  Patient instructions provided during this appointment: Patient Instructions  __You have been given prescription for Percocet and Lyrica in clinic today. Robaxon 760m sent to your pharmacy.  __________________________________________________________________________________________  Medication Rules  Applies to: All patients receiving prescriptions (written or electronic).  Pharmacy of record: Pharmacy where electronic prescriptions will be sent. If written prescriptions are taken to a different pharmacy, please inform the nursing staff. The pharmacy listed in the electronic medical record should be the one where you would like electronic prescriptions to be sent.  Prescription refills: Only during scheduled appointments. Applies to both, written and electronic prescriptions.  NOTE: The following applies primarily to controlled substances (Opioid* Pain Medications).   Patient's responsibilities: 1. Pain Pills: Bring all pain pills to every appointment (except for procedure appointments). 2. Pill Bottles: Bring pills in original pharmacy bottle. Always bring newest bottle. Bring bottle, even if empty. 3. Medication refills: You are responsible for knowing and keeping track of what medications you need refilled. The day before your appointment, write a list of all prescriptions that need to be refilled. Bring that list to your appointment and  give it to the admitting nurse. Prescriptions will be written only during appointments. If you forget a medication, it will not be "Called in", "Faxed", or "electronically sent". You will need to get another appointment to get these prescribed. 4. Prescription Accuracy: You are responsible for carefully inspecting your prescriptions before leaving our office. Have the discharge nurse carefully go over each prescription with you, before taking them home. Make sure that your name is accurately spelled, that your address is correct. Check the name and dose of your medication to make sure it is accurate. Check the number of pills, and the written instructions to make sure they are clear and accurate. Make sure that you are given enough medication to last  until your next medication refill appointment. 5. Taking Medication: Take medication as prescribed. Never take more pills than instructed. Never take medication more frequently than prescribed. Taking less pills or less frequently is permitted and encouraged, when it comes to controlled substances (written prescriptions).  6. Inform other Doctors: Always inform, all of your healthcare providers, of all the medications you take. 7. Pain Medication from other Providers: You are not allowed to accept any additional pain medication from any other Doctor or Healthcare provider. There are two exceptions to this rule. (see below) In the event that you require additional pain medication, you are responsible for notifying us, as stated below. 8. Medication Agreement: You are responsible for carefully reading and following our Medication Agreement. This must be signed before receiving any prescriptions from our practice. Safely store a copy of your signed Agreement. Violations to the Agreement will result in no further prescriptions. (Additional copies of our Medication Agreement are available upon request.) 9. Laws, Rules, & Regulations: All patients are expected to follow all Federal and [employer], Rules, [employer]. Ignorance of the Laws does not constitute a valid excuse. The use of any illegal substances is prohibited. 10. Adopted CDC guidelines & recommendations: Target dosing levels will be at or below 60 MME/day. Use of benzodiazepines** is not recommended.  Exceptions: There are only two exceptions to the rule of not receiving pain medications from other Healthcare Providers. 1. Exception #1 (Emergencies): In the event of an emergency (i.e.: accident requiring emergency care), you are allowed to receive additional pain medication. However, you are responsible for: As soon as you are able, call our office [phone], at any time of the day or night, and leave a message stating your name, the  date and nature of the emergency, and the name and dose of the medication prescribed. In the event that your call is answered by a member of our staff, make sure to document and save the date, time, and the name of the person that took your information.  2. Exception #2 (Planned Surgery): In the event that you are scheduled by another doctor or dentist to have any type of surgery or procedure, you are allowed (for a period no longer than 30 days), to receive additional pain medication, for the acute post-op pain. However, in this case, you are responsible for picking up a copy of our "Post-op Pain Management for Surgeons" handout, and giving it to your surgeon or dentist. This document is available at our office, and does not require an appointment to obtain it. Simply go to our office during business hours (Monday-Thursday from 8:00 AM to 4:00 PM) (Friday 8:00 AM to 12:00 Noon) or if you have a scheduled appointment with [place], prior to your surgery, and ask for  it by name. In addition, you will need to provide [place] with your name, name of your surgeon, type of surgery, and date of procedure or surgery.  *Opioid medications include: morphine, codeine, oxycodone, oxymorphone, hydrocodone, hydromorphone, meperidine, tramadol, tapentadol, buprenorphine, fentanyl, methadone. **Benzodiazepine medications include: diazepam (Valium), alprazolam (Xanax), clonazepam (Klonopine), lorazepam (Ativan), clorazepate (Tranxene), chlordiazepoxide (Librium), estazolam (Prosom), oxazepam (Serax), temazepam (Restoril), triazolam (Halcion) (Last updated: 12/11/2017) ____________________________________________________________________________________________

## 2018-03-26 NOTE — Progress Notes (Signed)
Nursing Pain Medication Assessment:  Safety precautions to be maintained throughout the outpatient stay will include: orient to surroundings, keep bed in low position, maintain call bell within reach at all times, provide assistance with transfer out of bed and ambulation.  Medication Inspection Compliance: Pill count conducted under aseptic conditions, in front of the patient. Neither the pills nor the bottle was removed from the patient's sight at any time. Once count was completed pills were immediately returned to the patient in their original bottle.  Medication: Hydrocodone/APAP Pill/Patch Count: 30 of 120 pills remain Pill/Patch Appearance: Markings consistent with prescribed medication Bottle Appearance: Standard pharmacy container. Clearly labeled. Filled Date05 / 22/ 2019 Last Medication intake:  Today

## 2018-03-31 LAB — TOXASSURE SELECT 13 (MW), URINE

## 2018-04-06 ENCOUNTER — Encounter: Payer: Self-pay | Admitting: Pain Medicine

## 2018-04-06 ENCOUNTER — Ambulatory Visit: Payer: Medicare Other | Attending: Pain Medicine | Admitting: Pain Medicine

## 2018-04-06 ENCOUNTER — Other Ambulatory Visit: Payer: Self-pay

## 2018-04-06 ENCOUNTER — Other Ambulatory Visit: Payer: Self-pay | Admitting: Pain Medicine

## 2018-04-06 VITALS — BP 149/68 | HR 76 | Temp 98.2°F | Resp 20 | Ht 69.0 in | Wt 148.0 lb

## 2018-04-06 DIAGNOSIS — M79602 Pain in left arm: Secondary | ICD-10-CM | POA: Diagnosis not present

## 2018-04-06 DIAGNOSIS — M65332 Trigger finger, left middle finger: Secondary | ICD-10-CM

## 2018-04-06 DIAGNOSIS — Z7984 Long term (current) use of oral hypoglycemic drugs: Secondary | ICD-10-CM | POA: Insufficient documentation

## 2018-04-06 DIAGNOSIS — M545 Low back pain: Secondary | ICD-10-CM | POA: Insufficient documentation

## 2018-04-06 DIAGNOSIS — F319 Bipolar disorder, unspecified: Secondary | ICD-10-CM | POA: Diagnosis not present

## 2018-04-06 DIAGNOSIS — I739 Peripheral vascular disease, unspecified: Secondary | ICD-10-CM | POA: Diagnosis not present

## 2018-04-06 DIAGNOSIS — Z789 Other specified health status: Secondary | ICD-10-CM | POA: Insufficient documentation

## 2018-04-06 DIAGNOSIS — G894 Chronic pain syndrome: Secondary | ICD-10-CM | POA: Diagnosis not present

## 2018-04-06 DIAGNOSIS — K5903 Drug induced constipation: Secondary | ICD-10-CM | POA: Diagnosis not present

## 2018-04-06 DIAGNOSIS — I4891 Unspecified atrial fibrillation: Secondary | ICD-10-CM | POA: Insufficient documentation

## 2018-04-06 DIAGNOSIS — M5441 Lumbago with sciatica, right side: Secondary | ICD-10-CM | POA: Diagnosis not present

## 2018-04-06 DIAGNOSIS — J449 Chronic obstructive pulmonary disease, unspecified: Secondary | ICD-10-CM | POA: Diagnosis not present

## 2018-04-06 DIAGNOSIS — M79604 Pain in right leg: Secondary | ICD-10-CM | POA: Diagnosis not present

## 2018-04-06 DIAGNOSIS — I10 Essential (primary) hypertension: Secondary | ICD-10-CM | POA: Insufficient documentation

## 2018-04-06 DIAGNOSIS — Z7982 Long term (current) use of aspirin: Secondary | ICD-10-CM | POA: Insufficient documentation

## 2018-04-06 DIAGNOSIS — D110 Benign neoplasm of parotid gland: Secondary | ICD-10-CM | POA: Insufficient documentation

## 2018-04-06 DIAGNOSIS — M899 Disorder of bone, unspecified: Secondary | ICD-10-CM | POA: Insufficient documentation

## 2018-04-06 DIAGNOSIS — M48061 Spinal stenosis, lumbar region without neurogenic claudication: Secondary | ICD-10-CM | POA: Diagnosis not present

## 2018-04-06 DIAGNOSIS — Z79891 Long term (current) use of opiate analgesic: Secondary | ICD-10-CM | POA: Insufficient documentation

## 2018-04-06 DIAGNOSIS — Z79899 Other long term (current) drug therapy: Secondary | ICD-10-CM

## 2018-04-06 DIAGNOSIS — M546 Pain in thoracic spine: Secondary | ICD-10-CM | POA: Diagnosis present

## 2018-04-06 DIAGNOSIS — M5116 Intervertebral disc disorders with radiculopathy, lumbar region: Secondary | ICD-10-CM | POA: Insufficient documentation

## 2018-04-06 DIAGNOSIS — M5414 Radiculopathy, thoracic region: Secondary | ICD-10-CM

## 2018-04-06 DIAGNOSIS — F17210 Nicotine dependence, cigarettes, uncomplicated: Secondary | ICD-10-CM | POA: Diagnosis not present

## 2018-04-06 DIAGNOSIS — G8929 Other chronic pain: Secondary | ICD-10-CM

## 2018-04-06 DIAGNOSIS — E785 Hyperlipidemia, unspecified: Secondary | ICD-10-CM | POA: Insufficient documentation

## 2018-04-06 DIAGNOSIS — Z7951 Long term (current) use of inhaled steroids: Secondary | ICD-10-CM | POA: Diagnosis not present

## 2018-04-06 DIAGNOSIS — M5114 Intervertebral disc disorders with radiculopathy, thoracic region: Secondary | ICD-10-CM | POA: Diagnosis not present

## 2018-04-06 DIAGNOSIS — K219 Gastro-esophageal reflux disease without esophagitis: Secondary | ICD-10-CM | POA: Insufficient documentation

## 2018-04-06 DIAGNOSIS — M5030 Other cervical disc degeneration, unspecified cervical region: Secondary | ICD-10-CM | POA: Diagnosis not present

## 2018-04-06 DIAGNOSIS — M5134 Other intervertebral disc degeneration, thoracic region: Secondary | ICD-10-CM | POA: Insufficient documentation

## 2018-04-06 DIAGNOSIS — K5730 Diverticulosis of large intestine without perforation or abscess without bleeding: Secondary | ICD-10-CM | POA: Diagnosis not present

## 2018-04-06 DIAGNOSIS — H3530 Unspecified macular degeneration: Secondary | ICD-10-CM | POA: Insufficient documentation

## 2018-04-06 DIAGNOSIS — F419 Anxiety disorder, unspecified: Secondary | ICD-10-CM | POA: Insufficient documentation

## 2018-04-06 DIAGNOSIS — M4726 Other spondylosis with radiculopathy, lumbar region: Secondary | ICD-10-CM | POA: Insufficient documentation

## 2018-04-06 MED ORDER — ROPIVACAINE HCL 2 MG/ML IJ SOLN
INTRAMUSCULAR | Status: AC
  Filled 2018-04-06: qty 10

## 2018-04-06 MED ORDER — TRIAMCINOLONE ACETONIDE 40 MG/ML IJ SUSP
INTRAMUSCULAR | Status: AC
  Filled 2018-04-06: qty 1

## 2018-04-06 MED ORDER — TRIAMCINOLONE ACETONIDE 40 MG/ML IJ SUSP
40.0000 mg | Freq: Once | INTRAMUSCULAR | Status: AC
  Administered 2018-04-06: 40 mg

## 2018-04-06 MED ORDER — LIDOCAINE HCL (PF) 2 % IJ SOLN
INTRAMUSCULAR | Status: AC
  Filled 2018-04-06: qty 10

## 2018-04-06 MED ORDER — ROPIVACAINE HCL 2 MG/ML IJ SOLN
2.0000 mL | Freq: Once | INTRAMUSCULAR | Status: AC
  Administered 2018-04-06: 10 mL

## 2018-04-06 MED ORDER — LIDOCAINE HCL 2 % IJ SOLN
5.0000 mL | Freq: Once | INTRAMUSCULAR | Status: AC
  Administered 2018-04-06: 200 mg

## 2018-04-06 NOTE — Addendum Note (Signed)
Addended by: Dewayne Shorter on: 04/06/2018 11:39 AM   Modules accepted: Orders

## 2018-04-06 NOTE — Patient Instructions (Signed)
____________________________________________________________________________________________  Post-Procedure Discharge Instructions  Instructions:  Apply ice: Fill a plastic sandwich bag with crushed ice. Cover it with a small towel and apply to injection site. Apply for 15 minutes then remove x 15 minutes. Repeat sequence on day of procedure, until you go to bed. The purpose is to minimize swelling and discomfort after procedure.  Apply heat: Apply heat to procedure site starting the day following the procedure. The purpose is to treat any soreness and discomfort from the procedure.  Food intake: Start with clear liquids (like water) and advance to regular food, as tolerated.   Physical activities: Keep activities to a minimum for the first 8 hours after the procedure.   Driving: If you have received any sedation, you are not allowed to drive for 24 hours after your procedure.  Blood thinner: Restart your blood thinner 6 hours after your procedure. (Only for those taking blood thinners)  Insulin: As soon as you can eat, you may resume your normal dosing schedule. (Only for those taking insulin)  Infection prevention: Keep procedure site clean and dry.  Post-procedure Pain Diary: Extremely important that this be done correctly and accurately. Recorded information will be used to determine the next step in treatment.  Pain evaluated is that of treated area only. Do not include pain from an untreated area.  Complete every hour, on the hour, for the initial 8 hours. Set an alarm to help you do this part accurately.  Do not go to sleep and have it completed later. It will not be accurate.  Follow-up appointment: Keep your follow-up appointment after the procedure. Usually 2 weeks for most procedures. (6 weeks in the case of radiofrequency.) Bring you pain diary.   Expect:  From numbing medicine (AKA: Local Anesthetics): Numbness or decrease in pain.  Onset: Full effect within 15  minutes of injected.  Duration: It will depend on the type of local anesthetic used. On the average, 1 to 8 hours.   From steroids: Decrease in swelling or inflammation. Once inflammation is improved, relief of the pain will follow.  Onset of benefits: Depends on the amount of swelling present. The more swelling, the longer it will take for the benefits to be seen. In some cases, up to 10 days.  Duration: Steroids will stay in the system x 2 weeks. Duration of benefits will depend on multiple posibilities including persistent irritating factors.  From procedure: Some discomfort is to be expected once the numbing medicine wears off. This should be minimal if ice and heat are applied as instructed.  Call if:  You experience numbness and weakness that gets worse with time, as opposed to wearing off.  New onset bowel or bladder incontinence. (This applies to Spinal procedures only)  Emergency Numbers:  Durning business hours (Monday - Thursday, 8:00 AM - 4:00 PM) (Friday, 9:00 AM - 12:00 Noon): [phone]  After hours: [phone] ____________________________________________________________________________________________   ____________________________________________________________________________________________  Preparing for your procedure (without sedation)  Instructions: . Oral Intake: Do not eat or drink anything for at least 3 hours prior to your procedure. . Transportation: Unless otherwise stated by your physician, you may drive yourself after the procedure. . Blood Pressure Medicine: Take your blood pressure medicine with a sip of water the morning of the procedure. . Blood thinners:  . Diabetics on insulin: Notify the staff so that you can be scheduled 1st case in the morning. If your diabetes requires high dose insulin, take only  of your normal insulin dose  the morning of the procedure and notify the staff that you have done so. . Preventing infections: Shower  with an antibacterial soap the morning of your procedure.  . Build-up your immune system: Take 1000 mg of Vitamin C with every meal (3 times a day) the day prior to your procedure. [place] Antibiotics: Inform the staff if you have a condition or reason that requires you to take antibiotics before dental procedures. . Pregnancy: If you are pregnant, call and cancel the procedure. . Sickness: If you have a cold, fever, or any active infections, call and cancel the procedure. . Arrival: You must be in the facility at least 30 minutes prior to your scheduled procedure. . Children: Do not bring any children with you. . Dress appropriately: Bring dark clothing that you would not mind if they get stained. . Valuables: Do not bring any jewelry or valuables.  Procedure appointments are reserved for interventional treatments only. [place] No Prescription Refills. . No medication changes will be discussed during procedure appointments. . No disability issues will be discussed.  Remember:  Regular Business hours are:  Monday to Thursday 8:00 AM to 4:00 PM  Provider's Schedule: Milinda Pointer, MD:  Procedure days: Tuesday and Thursday 7:30 AM to 4:00 PM  Gillis Santa, MD:  Procedure days: Monday and Wednesday 7:30 AM to 4:00 PM ____________________________________________________________________________________________

## 2018-04-06 NOTE — Progress Notes (Signed)
Patient's Name: Rick FITZNER Sr.  MRN: [id number]  Referring Provider: [facility], [place]  DOB: 1951-05-05  PCP: [facility], [place]  DOS: 04/06/2018  Note by: Gaspar Cola, MD  Service setting: Ambulatory outpatient  Specialty: Interventional Pain Management  Patient type: Established  Location: [facility] (AMB) Pain Management Facility  Visit type: Interventional Procedure   Primary Reason for Visit: Interventional Pain Management Treatment. CC: Back Pain (mid and low)  Procedure:          Anesthesia, Analgesia, Anxiolysis:  Type: Trigger Finger Ligament/Tendon sheath (20550) Injection. Purpose: Therapeutic Digit: No:3(Middle) Finger Laterality: Left-hand Position: Sitting Target Area: Flexor Digitorum Tendon sheath nodule Region: A-1(proximal) pulley of the metacarpal area Approach: Percutaneous  Type: Local Anesthesia Indication(s): Analgesia         Local Anesthetic: Lidocaine 1-2% Route: Infiltration ([place]/IM) IV Access: Declined Sedation: Declined    Indications: 1. Trigger finger, left middle finger    Pain Score: Pre-procedure: 4 /10 Post-procedure: 0-No pain/10  HPI  Rick Carter is a 67 y.o. year old, male patient, who comes today for a post-procedure evaluation. 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[place]); Atrial fibrillation ([place]); Bipolar affective disorder ([place]); Chronic obstructive pulmonary disease (HCC); DDD (degenerative disc disease), cervical; Colon, diverticulosis; Acid reflux; Essential (primary) hypertension; HLD (hyperlipidemia); Breath shortness; Compulsive tobacco user syndrome; Diverticular disease of large intestine; Post-thoracotomy pain syndrome; Pharmacologic therapy; Long term current use of opiate analgesic; Long term prescription opiate use; Uncomplicated opioid dependence ([place]); Opiate use (30 MME/Day); Fibromyalgia; Chronic pain syndrome; Lumbar facet syndrome (Bilateral) (R>L); History of alcoholism ([place]); Chronic low back pain  (Primary Area of Pain) (midline); Lumbar spondylosis; Cervical spondylosis; Failed cervical surgery syndrome; Nicotine dependence; Tobacco abuse; Substance Use Disorder Risk: High; History of right-sided partial pneumonectomy; Atherosclerotic peripheral vascular disease ([place]); Musculoskeletal pain; Neurogenic pain; Neuropathic pain; Opioid-induced constipation (OIC); Osteoarthritis; NSAID induced gastritis; Thoracic spine pain; Thoracic radicular pain (Right); Chronic lumbar radicular pain (L5/S1 dermatome) (Right); Lumbar foraminal stenosis (L5-S1) (Right); Lumbar facet arthropathy (Bilateral) (R>L); Lumbar disc protrusion (L3-4) (Right); Chronic cervical radicular pain (Left); Tumor of parotid gland; Trigger finger (middle finger) (Left); Disturbance of skin sensation; Pain of lumbar spine (L2-3 interspinous ligament); Chronic lower extremity pain (Secondary Area of Pain) (Right); DDD (degenerative disc disease), lumbar; Failed back surgical syndrome; Chronic upper extremity pain (Left); Pleomorphic adenoma of parotid gland; Disorder of skeletal system; Problems influencing health status; and DDD (degenerative disc disease), thoracic on their problem list. His primarily concern today is the Back Pain (mid and low)  Pain Assessment: Location: Lower Back Radiating: radiates down right leg on the side to top of foot Onset: More than a month ago Duration: Chronic pain Quality: Nagging, Sharp Severity: 4 /10 (subjective, self-reported pain score)  Note: Reported level is compatible with observation.                               Effect on ADL: affects activities Timing: Constant Modifying factors: lying down BP: (!) 149/68  HR: 76  Rick Carter comes in today for evaluation of his upper back pain and left hand, middle finger, trigger finger pain. The last time that we injected his trigger finger was approximately 8 months ago with excellent relief until recently. He wants [place] to go ahead and injected  again, as soon as possible.  Laboratory Chemistry  Inflammation Markers (CRP: Acute Phase) (ESR: Chronic Phase) Lab Results  Component Value Date  CRP <0.8 01/06/2017   ESRSEDRATE 5 01/06/2017                         Renal Markers Lab Results  Component Value Date   BUN 12 01/06/2017   CREATININE 0.79 01/06/2017   GFRAA >60 01/06/2017   GFRNONAA >60 01/06/2017                             Hepatic Markers Lab Results  Component Value Date   AST 30 01/06/2017   ALT 24 01/06/2017   ALBUMIN 4.2 01/06/2017                        Note: Lab results reviewed.  Recent Diagnostic Imaging Results  DG C-Arm 1-60 Min-No Report Fluoroscopy was utilized by the requesting physician.  No radiographic  interpretation.   Complexity Note: I personally reviewed the fluoroscopic imaging of the procedure.                        Meds   Current Outpatient Medications:  .  Aclidinium Bromide (TUDORZA PRESSAIR IN), Inhale 40 mcg into the lungs 2 (two) times daily., Disp: , Rfl:  .  ADVAIR DISKUS 250-50 MCG/DOSE AEPB, INHALE 1 PUFF 2 (TWO) TIMES A DAY., Disp: , Rfl: 11 .  albuterol (PROVENTIL) (2.5 MG/3ML) 0.083% nebulizer solution, INHALE 0.5 ML (2.5 MG TOTAL) BY NEBULIZATION EVERY SIX (6) HOURS AS NEEDED FOR WHEEZING., Disp: , Rfl: 11 .  aspirin EC 81 MG tablet, Take 81 mg by mouth daily., Disp: , Rfl:  .  Calcium Carb-Cholecalciferol 600-800 MG-UNIT TABS, TAKE 1 TABLET BY MOUTH 2 TIMES A DAY, Disp: , Rfl:  .  cholecalciferol (VITAMIN D) 1000 UNITS tablet, Take 2,000 Units by mouth 2 (two) times daily. , Disp: , Rfl:  .  diphenhydrAMINE (BENADRYL) 25 mg capsule, Take 25 mg by mouth 2 (two) times daily. , Disp: , Rfl:  .  Fluticasone-Salmeterol (ADVAIR) 500-50 MCG/DOSE AEPB, Inhale 1 puff into the lungs 2 (two) times daily., Disp: , Rfl:  .  hydrochlorothiazide (MICROZIDE) 12.5 MG capsule, TAKE ONE CAPSULE BY MOUTH FOR HIGH BLOOD PRESSURE, Disp: , Rfl: 3 .  hydrOXYzine (ATARAX/VISTARIL) 25 MG  tablet, Take 25 mg by mouth as needed., Disp: , Rfl:  .  ipratropium-albuterol (DUONEB) 0.5-2.5 (3) MG/3ML SOLN, Take 3 mLs by nebulization every 6 (six) hours as needed., Disp: , Rfl:  .  loratadine (CLARITIN) 10 MG tablet, Take 10 mg by mouth. , Disp: , Rfl:  .  metFORMIN (GLUCOPHAGE) 500 MG tablet, Take 500 mg by mouth 2 (two) times daily with a meal., Disp: , Rfl:  .  metFORMIN (GLUCOPHAGE-XR) 500 MG 24 hr tablet, TAKE 1 TABLET BY MOUTH EVERY DAY FOR DIABETES, Disp: , Rfl: 3 .  methocarbamol (ROBAXIN) 750 MG tablet, Take 1 tablet (750 mg total) by mouth every 8 (eight) hours as needed for muscle spasms., Disp: 90 tablet, Rfl: 2 .  Multiple Vitamins-Minerals (PRESERVISION AREDS 2 PO), Take 1 tablet by mouth 2 (two) times daily., Disp: , Rfl:  .  [START ON 06/02/2018] oxyCODONE-acetaminophen (PERCOCET/ROXICET) 5-325 MG tablet, Take 1 tablet by mouth every 6 (six) hours as needed for severe pain., Disp: 120 tablet, Rfl: 0 .  [START ON 05/03/2018] oxyCODONE-acetaminophen (PERCOCET/ROXICET) 5-325 MG tablet, Take 1 tablet by mouth every 6 (six) hours as needed for severe  pain., Disp: 120 tablet, Rfl: 0 .  oxyCODONE-acetaminophen (PERCOCET/ROXICET) 5-325 MG tablet, Take 1 tablet by mouth every 6 (six) hours as needed for severe pain., Disp: 120 tablet, Rfl: 0 .  pantoprazole (PROTONIX) 40 MG tablet, Take 40 mg by mouth., Disp: , Rfl:  .  pregabalin (LYRICA) 150 MG capsule, Take 1 capsule (150 mg total) by mouth 2 (two) times daily., Disp: 180 capsule, Rfl: 0 .  sildenafil (VIAGRA) 100 MG tablet, 100 mg., Disp: , Rfl:  .  sodium chloride HYPERTONIC 3 % nebulizer solution, INHALE 4 ML BY NEBULIZATION TWICE A DAY, Disp: , Rfl: 6 .  traZODone (DESYREL) 50 MG tablet, Take 50 mg by mouth at bedtime. Reported on 02/20/2016, Disp: , Rfl:  .  TUDORZA PRESSAIR 400 MCG/ACT AEPB, Inhale 1 puff into the lungs 2 (two) times daily., Disp: , Rfl: 11 .  VENTOLIN HFA 108 (90 Base) MCG/ACT inhaler, INHALE 2 PUFFS EVERY FOUR  (4) HOURS AS NEEDED FOR WHEEZING, Disp: , Rfl: 11 .  docusate sodium (COLACE) 100 MG capsule, Take 1 capsule (100 mg total) by mouth daily., Disp: 90 capsule, Rfl: 0 .  ibuprofen (ADVIL,MOTRIN) 800 MG tablet, Take 1 tablet (800 mg total) by mouth every 8 (eight) hours as needed for moderate pain., Disp: 270 tablet, Rfl: 0  ROS  Constitutional: Denies any fever or chills Gastrointestinal: No reported hemesis, hematochezia, vomiting, or acute GI distress Musculoskeletal: Denies any acute onset joint swelling, redness, loss of ROM, or weakness Neurological: No reported episodes of acute onset apraxia, aphasia, dysarthria, agnosia, amnesia, paralysis, loss of coordination, or loss of consciousness  Allergies  Rick Carter is allergic to atorvastatin and varenicline.  PFSH  Drug: Rick Carter  has no drug history on file. Alcohol:  reports that he does not drink alcohol. Tobacco:  reports that he has been smoking cigarettes.  He has a 51.00 pack-year smoking history. He has never used smokeless tobacco. Medical:  has a past medical history of Anxiety, COPD (chronic obstructive pulmonary disease) ([place]), Depression, Hernia of abdominal wall, History of alcoholism ([place]) (08/08/2015), History of pneumonectomy (08/08/2015), Hypertension, Macular degeneration (08/26/2016), and Tumor cells. Surgical: Rick Carter  has a past surgical history that includes Tonsillectomy; Lung removal, partial; and Neck surgery. Family: family history includes Alcohol abuse in his father; Kidney disease in his mother.  Constitutional Exam  General appearance: Well nourished, well developed, and well hydrated. In no apparent acute distress Vitals:   04/06/18 1007 04/06/18 1044  BP: (!) 161/66 (!) 149/68  Pulse: 71 76  Resp: 20 20  Temp: 98.2 F (36.8 C)   SpO2: 97% 96%  Weight: 148 lb (67.1 kg)   Height: _0  (1.753 m)    BMI Assessment: Estimated body mass index is 21.86 kg/m as calculated from the following:    Height as of this encounter: _1  (1.753 m).   Weight as of this encounter: 148 lb (67.1 kg).  BMI interpretation table: BMI level Category Range association with higher incidence of chronic pain  <18 kg/m2 Underweight   18.5-24.9 kg/m2 Ideal body weight   25-29.9 kg/m2 Overweight Increased incidence by 20%  30-34.9 kg/m2 Obese (Class I) Increased incidence by 68%  35-39.9 kg/m2 Severe obesity (Class II) Increased incidence by 136%  >40 kg/m2 Extreme obesity (Class III) Increased incidence by 254%   Patient's current BMI Ideal Body weight  Body mass index is 21.86 kg/m. Ideal body weight: 70.7 kg (155 lb 13.8 oz)   BMI Readings from Last  4 Encounters:  04/06/18 21.86 kg/m  03/26/18 21.56 kg/m  03/05/18 21.86 kg/m  12/24/17 21.86 kg/m   Wt Readings from Last 4 Encounters:  04/06/18 148 lb (67.1 kg)  03/26/18 146 lb (66.2 kg)  03/05/18 148 lb (67.1 kg)  12/24/17 148 lb (67.1 kg)  Psych/Mental status: Alert, oriented x 3 (person, place, & time)       Eyes: PERLA Respiratory: No evidence of acute respiratory distress  Cervical Spine Area Exam  Skin & Axial Inspection: No masses, redness, edema, swelling, or associated skin lesions Alignment: Symmetrical Functional ROM: Unrestricted ROM      Stability: No instability detected Muscle Tone/Strength: Functionally intact. No obvious neuro-muscular anomalies detected. Sensory (Neurological): Unimpaired Palpation: No palpable anomalies              Upper Extremity (UE) Exam    Side: Right upper extremity  Side: Left upper extremity  Skin & Extremity Inspection: Skin color, temperature, and hair growth are WNL. No peripheral edema or cyanosis. No masses, redness, swelling, asymmetry, or associated skin lesions. No contractures.  Skin & Extremity Inspection: Tender left hand trigger finger nodule at the A1 pulley.  Functional ROM: Unrestricted ROM          Functional ROM: Decreased ROM For the middle finger due to the palpable  nodule in the flexor tendon  Muscle Tone/Strength: Functionally intact. No obvious neuro-muscular anomalies detected.  Muscle Tone/Strength: Functionally intact. No obvious neuro-muscular anomalies detected.  Sensory (Neurological): Unimpaired          Sensory (Neurological): Movement-associated pain affecting the hand  Palpation: No palpable anomalies              Palpation: (+) Tenderness in the flexor portion of the middle finger secondary to the cyst/nodule in the flexor tendon.        Provocative Test(s):  Phalen's test: deferred Tinel's test: deferred Apley's scratch test (touch opposite shoulder):  Action 1 (Across chest): deferred Action 2 (Overhead): deferred Action 3 (LB reach): deferred   Provocative Test(s):  Phalen's test: deferred Tinel's test: deferred Apley's scratch test (touch opposite shoulder):  Action 1 (Across chest): deferred Action 2 (Overhead): deferred Action 3 (LB reach): deferred    Thoracic Spine Area Exam  Skin & Axial Inspection: No masses, redness, or swelling Alignment: Symmetrical Functional ROM: Diminished ROM Stability: No instability detected Muscle Tone/Strength: Functionally intact. No obvious neuro-muscular anomalies detected. Sensory (Neurological): Movement associated pain Muscle strength & Tone: Complains of area being tender to palpation  Lumbar Spine Area Exam  Skin & Axial Inspection: No masses, redness, or swelling Alignment: Symmetrical Functional ROM: Improved after treatment       Stability: No instability detected Muscle Tone/Strength: Functionally intact. No obvious neuro-muscular anomalies detected. Sensory (Neurological): Unimpaired Palpation: No palpable anomalies       Provocative Tests: Lumbar Hyperextension/rotation test: deferred today       Lumbar quadrant test (Kemp's test): deferred today       Lumbar Lateral bending test: deferred today       Patrick's Maneuver: deferred today                   FABER test:  deferred today       Thigh-thrust test: deferred today       S-I compression test: deferred today       S-I distraction test: deferred today        Gait & Posture Assessment  Ambulation: Unassisted Gait: Relatively normal for age and  body habitus Posture: WNL   Lower Extremity Exam    Side: Right lower extremity  Side: Left lower extremity  Stability: No instability observed          Stability: No instability observed          Skin & Extremity Inspection: Skin color, temperature, and hair growth are WNL. No peripheral edema or cyanosis. No masses, redness, swelling, asymmetry, or associated skin lesions. No contractures.  Skin & Extremity Inspection: Skin color, temperature, and hair growth are WNL. No peripheral edema or cyanosis. No masses, redness, swelling, asymmetry, or associated skin lesions. No contractures.  Functional ROM: Unrestricted ROM                  Functional ROM: Unrestricted ROM                  Muscle Tone/Strength: Functionally intact. No obvious neuro-muscular anomalies detected.  Muscle Tone/Strength: Functionally intact. No obvious neuro-muscular anomalies detected.  Sensory (Neurological): Unimpaired  Sensory (Neurological): Unimpaired  Palpation: No palpable anomalies  Palpation: No palpable anomalies   Assessment  Primary Diagnosis & Pertinent Problem List: The primary encounter diagnosis was Trigger finger, left middle finger. Diagnoses of Chronic low back pain (Primary Area of Pain) (midline), Chronic lower extremity pain (Secondary Area of Pain) (Right), Chronic upper extremity pain (Left), DDD (degenerative disc disease), thoracic, Thoracic spine pain, Thoracic radicular pain (Right), Chronic pain syndrome, Pharmacologic therapy, Disorder of skeletal system, and Problems influencing health status were also pertinent to this visit.  Status Diagnosis  Recurring Improved Resolved 1. Trigger finger, left middle finger   2. Chronic low back pain (Primary Area  of Pain) (midline)   3. Chronic lower extremity pain (Secondary Area of Pain) (Right)   4. Chronic upper extremity pain (Left)   5. DDD (degenerative disc disease), thoracic   6. Thoracic spine pain   7. Thoracic radicular pain (Right)   8. Chronic pain syndrome   9. Pharmacologic therapy   10. Disorder of skeletal system   11. Problems influencing health status     Problems updated and reviewed during this visit: Problem  Ddd (Degenerative Disc Disease), Thoracic  Trigger finger (middle finger) (Left)  Disorder of Skeletal System  Problems Influencing Health Status  Pharmacologic Therapy   Plan of Care  Pharmacotherapy (Medications Ordered): Meds ordered this encounter  Medications  . triamcinolone acetonide (KENALOG-40) injection 40 mg  . ropivacaine (PF) 2 mg/mL (0.2%) (NAROPIN) injection 2 mL  . lidocaine (XYLOCAINE) 2 % (with pres) injection 100 mg   Medications administered today: We administered triamcinolone acetonide, ropivacaine (PF) 2 mg/mL (0.2%), and lidocaine.   Procedure Orders     Injection tendon or ligament     Injection tendon or ligament     Thoracic Epidural Injection     Thoracic Epidural Injection Lab Orders  No laboratory test(s) ordered today   Imaging Orders  No imaging studies ordered today   Referral Orders  No referral(s) requested today    Interventional management options: Planned, scheduled, and/or pending:   Therapeutic left hand, ring finger, trigger finger injection (Today) Schedule palliative right-sided T8-9 thoracic ESI    Considering:   Palliative/therapeuticbilateral lumbar facet block Palliative/therapeutic left-sided cervical ESI  Palliative/therapeutic right-sided T8-9 thoracic ESI  Palliative/therapeutic right-sided L5-S1 TFESI  Diagnostic right-sided L3-4 TFESI  Palliative/therapeutic right-sided L4-5 interlaminar ESI  Palliative/therapeutic left hand, ring finger, trigger finger injection    Palliative PRN  treatment(s):   Palliativebilateral lumbar facet block Palliative left-sided  cervical ESI  Palliative right-sided T8-9 thoracic ESI  Palliative right-sided L5-S1 TFESI  Palliative right-sided L4-5 interlaminar ESI  Palliative left hand, ring finger, trigger finger injection    Pre-op Assessment:  Rick Carter is a 67 y.o. (year old), male patient, seen today for interventional treatment. He  has a past surgical history that includes Tonsillectomy; Lung removal, partial; and Neck surgery. Rick Carter has a current medication list which includes the following prescription(s): aclidinium bromide, advair diskus, albuterol, aspirin ec, calcium carb-cholecalciferol, cholecalciferol, diphenhydramine, fluticasone-salmeterol, hydrochlorothiazide, hydroxyzine, ipratropium-albuterol, loratadine, metformin, metformin, methocarbamol, multiple vitamins-minerals, oxycodone-acetaminophen, oxycodone-acetaminophen, oxycodone-acetaminophen, pantoprazole, pregabalin, sildenafil, sodium chloride hypertonic, trazodone, tudorza pressair, ventolin hfa, docusate sodium, and ibuprofen. His primarily concern today is the Back Pain (mid and low)  Initial Vital Signs:  Pulse/HCG Rate: 71  Temp: 98.2 F (36.8 C) Resp: 20 BP: (!) 161/66 SpO2: 97 %  BMI: Estimated body mass index is 21.86 kg/m as calculated from the following:   Height as of this encounter: _0  (1.753 m).   Weight as of this encounter: 148 lb (67.1 kg).  Risk Assessment: Allergies: Reviewed. He is allergic to atorvastatin and varenicline.  Allergy Precautions: None required Coagulopathies: Reviewed. None identified.  Blood-thinner therapy: None at this time Active Infection(s): Reviewed. None identified. Rick Carter is afebrile  Site Confirmation: Rick Carter was asked to confirm the procedure and laterality before marking the site Procedure checklist: Completed Consent: Before the procedure and under the influence of no sedative(s), amnesic(s), or  anxiolytics, the patient was informed of the treatment options, risks and possible complications. To fulfill our ethical and legal obligations, as recommended by the American Medical Association's Code of Ethics, I have informed the patient of my clinical impression; the nature and purpose of the treatment or procedure; the risks, benefits, and possible complications of the intervention; the alternatives, including doing nothing; the risk(s) and benefit(s) of the alternative treatment(s) or procedure(s); and the risk(s) and benefit(s) of doing nothing. The patient was provided information about the general risks and possible complications associated with the procedure. These may include, but are not limited to: failure to achieve desired goals, infection, bleeding, organ or nerve damage, allergic reactions, paralysis, and death. In addition, the patient was informed of those risks and complications associated to the procedure, such as failure to decrease pain; infection; bleeding; organ or nerve damage with subsequent damage to sensory, motor, and/or autonomic systems, resulting in permanent pain, numbness, and/or weakness of one or several areas of the body; allergic reactions; (i.e.: anaphylactic reaction); and/or death. Furthermore, the patient was informed of those risks and complications associated with the medications. These include, but are not limited to: allergic reactions (i.e.: anaphylactic or anaphylactoid reaction(s)); adrenal axis suppression; blood sugar elevation that in diabetics may result in ketoacidosis or comma; water retention that in patients with history of congestive heart failure may result in shortness of breath, pulmonary edema, and decompensation with resultant heart failure; weight gain; swelling or edema; medication-induced neural toxicity; particulate matter embolism and blood vessel occlusion with resultant organ, and/or nervous system infarction; and/or aseptic necrosis of one or  more joints. Finally, the patient was informed that Medicine is not an exact science; therefore, there is also the possibility of unforeseen or unpredictable risks and/or possible complications that may result in a catastrophic outcome. The patient indicated having understood very clearly. We have given the patient no guarantees and we have made no promises. Enough time was given to the patient to ask questions, all of which were  answered to the patient's satisfaction. Rick Carter has indicated that he wanted to continue with the procedure. Attestation: I, the ordering provider, attest that I have discussed with the patient the benefits, risks, side-effects, alternatives, likelihood of achieving goals, and potential problems during recovery for the procedure that I have provided informed consent. Date  Time: 04/06/2018 10:09 AM  Pre-Procedure Preparation:  Monitoring: As per clinic protocol. Respiration, ETCO2, SpO2, BP, heart rate and rhythm monitor placed and checked for adequate function Safety Precautions: Patient was assessed for positional comfort and pressure points before starting the procedure. Time-out: I initiated and conducted the "Time-out" before starting the procedure, as per protocol. The patient was asked to participate by confirming the accuracy of the "Time Out" information. Verification of the correct person, site, and procedure were performed and confirmed by me, the nursing staff, and the patient. "Time-out" conducted as per Joint Commission's Universal Protocol (UP.01.01.01). Time: 1040  Description of Procedure:          Area Prepped: Entire palmar and dorsal aspect of hand, up to forearm area. Prepping solution: ChloraPrep (2% chlorhexidine gluconate and 70% isopropyl alcohol) Safety Precautions: Aspiration looking for blood return was conducted prior to all injections. At no point did we inject any substances, as a needle was being advanced. No attempts were made at seeking any  paresthesias. Safe injection practices and needle disposal techniques used. Medications properly checked for expiration dates. SDV (single dose vial) medications used. Description of the Procedure: Protocol guidelines were followed. The patient was placed in position. The target area was identified and prepped in the usual manner. Skin & deeper tissues infiltrated with local anesthetic. Appropriate time provided for local anesthetics to take effect. The procedure needle was slowly advanced to target area. Proper needle placement secured. Negative aspiration confirmed. Solution injected in intermittent fashion, asking for systemic symptoms every 0.5cc. Needle(s) removed and area cleaned, making sure to leave some prepping solution back to take advantage of its long term bactericidal properties.  Vitals:   04/06/18 1007 04/06/18 1044  BP: (!) 161/66 (!) 149/68  Pulse: 71 76  Resp: 20 20  Temp: 98.2 F (36.8 C)   SpO2: 97% 96%  Weight: 148 lb (67.1 kg)   Height: _0  (1.753 m)     Start Time: 1040 hrs. End Time: 1042 hrs. Materials:  Needle(s) Type: Regular needle Gauge: 25G Length: 1.5-in Medication(s): Please see orders for medications and dosing details.    Imaging Guidance:          Type of Imaging Technique: None used Indication(s): N/A Exposure Time: No patient exposure Contrast: None used. Fluoroscopic Guidance: N/A Ultrasound Guidance: N/A Interpretation: N/A  Post-operative Assessment:  Post-procedure Vital Signs:  Pulse/HCG Rate: 76  Temp: 98.2 F (36.8 C) Resp: 20 BP: (!) 149/68 SpO2: 96 %  EBL: None  Complications: No immediate post-treatment complications observed by team, or reported by patient.  Note: The patient tolerated the entire procedure well. A repeat set of vitals were taken after the procedure and the patient was kept under observation following institutional policy, for this type of procedure. Post-procedural neurological assessment was performed,  showing return to baseline, prior to discharge. The patient was provided with post-procedure discharge instructions, including a section on how to identify potential problems. Should any problems arise concerning this procedure, the patient was given instructions to immediately contact us, at any time, without hesitation. In any case, we plan to contact the patient by telephone for a follow-up status report regarding this  interventional procedure.  Comments:  No additional relevant information.  Plan of Care   Imaging Orders  No imaging studies ordered today    Procedure Orders     Injection tendon or ligament     Injection tendon or ligament     Thoracic Epidural Injection     Thoracic Epidural Injection  Medications ordered for procedure: Meds ordered this encounter  Medications  . triamcinolone acetonide (KENALOG-40) injection 40 mg  . ropivacaine (PF) 2 mg/mL (0.2%) (NAROPIN) injection 2 mL  . lidocaine (XYLOCAINE) 2 % (with pres) injection 100 mg   Medications administered: We administered triamcinolone acetonide, ropivacaine (PF) 2 mg/mL (0.2%), and lidocaine.  See the medical record for exact dosing, route, and time of administration.  New Prescriptions   No medications on file   Disposition: Discharge home  Discharge Date & Time: 04/06/2018; 1045 hrs.   Physician-requested Follow-up: Return in about 2 weeks (around 04/20/2018) for Procedure (no sedation): (R) T8-9 TESI.  Future Appointments  Date Time Provider [place]  04/06/2018  1:15 PM Milinda Pointer, MD [facility] None  04/23/2018  7:45 AM Milinda Pointer, MD [facility] None   Primary Care Physician: [facility] [place] Location: [facility] Outpatient Pain Management Facility Note by: Gaspar Cola, MD Date: 04/06/2018; Time: 11:01 AM  Disclaimer:  Medicine is not an Chief Strategy Officer. The only guarantee in medicine is that nothing is guaranteed. It is important to note that the decision to proceed with  this intervention was based on the information collected from the patient. The Data and conclusions were drawn from the patient's questionnaire, the interview, and the physical examination. Because the information was provided in large part by the patient, it cannot be guaranteed that it has not been purposely or unconsciously manipulated. Every effort has been made to obtain as much relevant data as possible for this evaluation. It is important to note that the conclusions that lead to this procedure are derived in large part from the available data. Always take into account that the treatment will also be dependent on availability of resources and existing treatment guidelines, considered by other Pain Management Practitioners as being common knowledge and practice, at the time of the intervention. For Medico-Legal purposes, it is also important to point out that variation in procedural techniques and pharmacological choices are the acceptable norm. The indications, contraindications, technique, and results of the above procedure should only be interpreted and judged by a Board-Certified Interventional Pain Specialist with extensive familiarity and expertise in the same exact procedure and technique.

## 2018-04-10 LAB — VITAMIN B12: Vitamin B-12: 395 pg/mL (ref 232–1245)

## 2018-04-10 LAB — COMP. METABOLIC PANEL (12)
ALK PHOS: 64 IU/L (ref 39–117)
AST: 18 IU/L (ref 0–40)
Albumin/Globulin Ratio: 2.1 (ref 1.2–2.2)
Albumin: 4.6 g/dL (ref 3.6–4.8)
BUN/Creatinine Ratio: 11 (ref 10–24)
BUN: 9 mg/dL (ref 8–27)
Bilirubin Total: 0.3 mg/dL (ref 0.0–1.2)
Calcium: 9.8 mg/dL (ref 8.6–10.2)
Chloride: 102 mmol/L (ref 96–106)
Creatinine, Ser: 0.84 mg/dL (ref 0.76–1.27)
GFR calc Af Amer: 104 mL/min/{1.73_m2} (ref >59)
GFR, EST NON AFRICAN AMERICAN: 90 mL/min/{1.73_m2} (ref >59)
GLOBULIN, TOTAL: 2.2 g/dL (ref 1.5–4.5)
GLUCOSE: 121 mg/dL — AB (ref 65–99)
POTASSIUM: 4.2 mmol/L (ref 3.5–5.2)
Sodium: 144 mmol/L (ref 134–144)
Total Protein: 6.8 g/dL (ref 6.0–8.5)

## 2018-04-10 LAB — 25-HYDROXYVITAMIN D LCMS D2+D3: 25-HYDROXY, VITAMIN D-3: 76 ng/mL

## 2018-04-10 LAB — MAGNESIUM: MAGNESIUM: 2.1 mg/dL (ref 1.6–2.3)

## 2018-04-10 LAB — SEDIMENTATION RATE: Sed Rate: 4 mm/hr (ref 0–30)

## 2018-04-10 LAB — 25-HYDROXY VITAMIN D LCMS D2+D3: 25-Hydroxy, Vitamin D: 76 ng/mL

## 2018-04-10 LAB — C-REACTIVE PROTEIN: CRP: 2 mg/L (ref 0–10)

## 2018-04-23 ENCOUNTER — Ambulatory Visit: Payer: Medicare Other | Attending: Pain Medicine | Admitting: Pain Medicine

## 2018-04-23 NOTE — Progress Notes (Deleted)
Cancelled appointment

## 2018-05-05 ENCOUNTER — Telehealth: Payer: Self-pay

## 2018-05-05 NOTE — Telephone Encounter (Signed)
Rick Carter called and wanted to inform Dr Dossie Arbour that he's been having lung problems and will reschedule his procedures when he's better.

## 2018-06-18 ENCOUNTER — Ambulatory Visit: Payer: Medicare Other | Admitting: Pain Medicine

## 2018-06-18 NOTE — Progress Notes (Deleted)
Patient's Name: Rick SALZWEDEL Sr.  MRN: [id number]  Referring Provider: [facility], [place]  DOB: 1951/04/30  PCP: [facility], [place]  DOS: 06/18/2018  Note by: Gaspar Cola, MD  Service setting: Ambulatory outpatient  Specialty: Interventional Pain Management  Patient type: Established  Location: [facility] (AMB) Pain Management Facility  Visit type: Interventional Procedure   Primary Reason for Visit: Interventional Pain Management Treatment. CC: No chief complaint on file.  Procedure:          Anesthesia, Analgesia, Anxiolysis:  Type: Palliative Inter-Laminar Thoracic Epidural Block          Region: Posterior Thoracolumbar Level: T8-9 Laterality: Right Paraspinal  Type: Local Anesthesia Indication(s): Analgesia         Route: Infiltration ([place]/IM) IV Access: Declined Sedation: Declined  Local Anesthetic: Lidocaine 1-2%   Indications: 1. DDD (degenerative disc disease), thoracic   2. Post-thoracotomy pain syndrome   3. Thoracic radicular pain (Right)   4. Thoracic spine pain    Pain Score: Pre-procedure:  /10 Post-procedure:  /10  Pre-op Assessment:  Rick Carter is a 67 y.o. (year old), male patient, seen today for interventional treatment. He  has a past surgical history that includes Tonsillectomy; Lung removal, partial; and Neck surgery. Rick Carter has a current medication list which includes the following prescription(s): aclidinium bromide, advair diskus, albuterol, aspirin ec, calcium carb-cholecalciferol, cholecalciferol, diphenhydramine, docusate sodium, fluticasone-salmeterol, hydrochlorothiazide, hydroxyzine, ibuprofen, ipratropium-albuterol, loratadine, metformin, metformin, methocarbamol, multiple vitamins-minerals, oxycodone-acetaminophen, oxycodone-acetaminophen, oxycodone-acetaminophen, pantoprazole, pregabalin, sildenafil, sodium chloride hypertonic, trazodone, tudorza pressair, and ventolin hfa. His primarily concern today is the No chief complaint on file.  Initial  Vital Signs:  Pulse/HCG Rate:    Temp:   Resp:   BP:   SpO2:    BMI: Estimated body mass index is 21.86 kg/m as calculated from the following:   Height as of 04/06/18: _0  (1.753 m).   Weight as of 04/06/18: 148 lb (67.1 kg).  Risk Assessment: Allergies: Reviewed. He is allergic to atorvastatin and varenicline.  Allergy Precautions: None required Coagulopathies: Reviewed. None identified.  Blood-thinner therapy: None at this time Active Infection(s): Reviewed. None identified. Rick Carter is afebrile  Site Confirmation: Rick Carter was asked to confirm the procedure and laterality before marking the site Procedure checklist: Completed Consent: Before the procedure and under the influence of no sedative(s), amnesic(s), or anxiolytics, the patient was informed of the treatment options, risks and possible complications. To fulfill our ethical and legal obligations, as recommended by the American Medical Association's Code of Ethics, I have informed the patient of my clinical impression; the nature and purpose of the treatment or procedure; the risks, benefits, and possible complications of the intervention; the alternatives, including doing nothing; the risk(s) and benefit(s) of the alternative treatment(s) or procedure(s); and the risk(s) and benefit(s) of doing nothing. The patient was provided information about the general risks and possible complications associated with the procedure. These may include, but are not limited to: failure to achieve desired goals, infection, bleeding, organ or nerve damage, allergic reactions, paralysis, and death. In addition, the patient was informed of those risks and complications associated to Spine-related procedures, such as failure to decrease pain; infection (i.e.: Meningitis, epidural or intraspinal abscess); bleeding (i.e.: epidural hematoma, subarachnoid hemorrhage, or any other type of intraspinal or peri-dural bleeding); organ or nerve damage (i.e.:  Any type of peripheral nerve, nerve root, or spinal cord injury) with subsequent damage to sensory, motor, and/or autonomic systems, resulting in permanent pain, numbness, and/or weakness of  one or several areas of the body; allergic reactions; (i.e.: anaphylactic reaction); and/or death. Furthermore, the patient was informed of those risks and complications associated with the medications. These include, but are not limited to: allergic reactions (i.e.: anaphylactic or anaphylactoid reaction(s)); adrenal axis suppression; blood sugar elevation that in diabetics may result in ketoacidosis or comma; water retention that in patients with history of congestive heart failure may result in shortness of breath, pulmonary edema, and decompensation with resultant heart failure; weight gain; swelling or edema; medication-induced neural toxicity; particulate matter embolism and blood vessel occlusion with resultant organ, and/or nervous system infarction; and/or aseptic necrosis of one or more joints. Finally, the patient was informed that Medicine is not an exact science; therefore, there is also the possibility of unforeseen or unpredictable risks and/or possible complications that may result in a catastrophic outcome. The patient indicated having understood very clearly. We have given the patient no guarantees and we have made no promises. Enough time was given to the patient to ask questions, all of which were answered to the patient's satisfaction. Rick Carter has indicated that he wanted to continue with the procedure. Attestation: I, the ordering provider, attest that I have discussed with the patient the benefits, risks, side-effects, alternatives, likelihood of achieving goals, and potential problems during recovery for the procedure that I have provided informed consent. Date  Time: {CHL [facility]-PAIN TIME CHOICES:[id number]}  Pre-Procedure Preparation:  Monitoring: As per clinic protocol. Respiration, ETCO2, SpO2,  BP, heart rate and rhythm monitor placed and checked for adequate function Safety Precautions: Patient was assessed for positional comfort and pressure points before starting the procedure. Time-out: I initiated and conducted the "Time-out" before starting the procedure, as per protocol. The patient was asked to participate by confirming the accuracy of the "Time Out" information. Verification of the correct person, site, and procedure were performed and confirmed by me, the nursing staff, and the patient. "Time-out" conducted as per Joint Commission's Universal Protocol (UP.01.01.01). Time:    Description of Procedure:          Position: Prone Target Area: For Epidural Steroid injection(s), the target area is the  interlaminar space, initially targeting the lower border of the superior vertebral body lamina. Approach: Interlaminar approach. Area Prepped: Entire Posterior Thoracolumbar Region Prepping solution: ChloraPrep (2% chlorhexidine gluconate and 70% isopropyl alcohol) Safety Precautions: Aspiration looking for blood return was conducted prior to all injections. At no point did we inject any substances, as a needle was being advanced. No attempts were made at seeking any paresthesias. Safe injection practices and needle disposal techniques used. Medications properly checked for expiration dates. SDV (single dose vial) medications used. Description of the Procedure: Protocol guidelines were followed. The patient was placed in position over the fluoroscopy table. The target area was identified and the area prepped in the usual manner. Skin & deeper tissues infiltrated with local anesthetic. Appropriate amount of time allowed to pass for local anesthetics to take effect. The procedure needles were then advanced to the target area. The inferior aspect of the superior lamina was contacted and the needle walked caudad, until the lamina was cleared. The epidural space was identified using  "loss-of-resistance technique" with 0.9% PF-NSS (2-30m), in a low friction 10cc LOR glass syringe. Proper needle placement was secured. Negative aspiration confirmed. Solution injected in intermittent fashion, asking for systemic symptoms every 0.5 cc of injectate. The needles were then removed and the area cleansed, making sure to leave some of the prepping solution behind to take advantage  of its long term bactericidal properties. There were no vitals filed for this visit.  Start Time:   hrs. End Time:   hrs. Materials:  Needle(s) Type: Epidural needle Gauge: 17G Length: 3.5-in Medication(s): Please see orders for medications and dosing details.  Imaging Guidance (Spinal):          Type of Imaging Technique: Fluoroscopy Guidance (Spinal) Indication(s): Assistance in needle guidance and placement for procedures requiring needle placement in or near specific anatomical locations not easily accessible without such assistance. Exposure Time: Please see nurses notes. Contrast: Before injecting any contrast, we confirmed that the patient did not have an allergy to iodine, shellfish, or radiological contrast. Once satisfactory needle placement was completed at the desired level, radiological contrast was injected. Contrast injected under live fluoroscopy. No contrast complications. See chart for type and volume of contrast used. Fluoroscopic Guidance: I was personally present during the use of fluoroscopy. "Tunnel Vision Technique" used to obtain the best possible view of the target area. Parallax error corrected before commencing the procedure. "Direction-depth-direction" technique used to introduce the needle under continuous pulsed fluoroscopy. Once target was reached, antero-posterior, oblique, and lateral fluoroscopic projection used confirm needle placement in all planes. Images permanently stored in EMR. Interpretation: I personally interpreted the imaging intraoperatively. Adequate needle  placement confirmed in multiple planes. Appropriate spread of contrast into desired area was observed. No evidence of afferent or efferent intravascular uptake. No intrathecal or subarachnoid spread observed. Permanent images saved into the patient's record.  Antibiotic Prophylaxis:   Anti-infectives (From admission, onward)   None     Indication(s): None identified  Post-operative Assessment:  Post-procedure Vital Signs:  Pulse/HCG Rate:    Temp:   Resp:   BP:   SpO2:    EBL: None  Complications: No immediate post-treatment complications observed by team, or reported by patient.  Note: The patient tolerated the entire procedure well. A repeat set of vitals were taken after the procedure and the patient was kept under observation following institutional policy, for this type of procedure. Post-procedural neurological assessment was performed, showing return to baseline, prior to discharge. The patient was provided with post-procedure discharge instructions, including a section on how to identify potential problems. Should any problems arise concerning this procedure, the patient was given instructions to immediately contact us, at any time, without hesitation. In any case, we plan to contact the patient by telephone for a follow-up status report regarding this interventional procedure.  Comments:  No additional relevant information.  Plan of Care   Imaging Orders  No imaging studies ordered today   Procedure Orders    No procedure(s) ordered today    Medications ordered for procedure: No orders of the defined types were placed in this encounter.  Medications administered: Sr. Pat Patrick Sr. had no medications administered during this visit.  See the medical record for exact dosing, route, and time of administration.  New Prescriptions   No medications on file   Disposition: Discharge home  Discharge Date & Time: 06/18/2018;   hrs.   Physician-requested Follow-up:  No follow-ups on file.  Future Appointments  Date Time Provider [place]  06/18/2018 10:15 AM Milinda Pointer, MD [facility] None   Primary Care Physician: [facility] [place] Location: [facility] Outpatient Pain Management Facility Note by: Gaspar Cola, MD Date: 06/18/2018; Time: 7:15 AM  Disclaimer:  Medicine is not an Chief Strategy Officer. The only guarantee in medicine is that nothing is guaranteed. It is important to note that the decision  to proceed with this intervention was based on the information collected from the patient. The Data and conclusions were drawn from the patient's questionnaire, the interview, and the physical examination. Because the information was provided in large part by the patient, it cannot be guaranteed that it has not been purposely or unconsciously manipulated. Every effort has been made to obtain as much relevant data as possible for this evaluation. It is important to note that the conclusions that lead to this procedure are derived in large part from the available data. Always take into account that the treatment will also be dependent on availability of resources and existing treatment guidelines, considered by other Pain Management Practitioners as being common knowledge and practice, at the time of the intervention. For Medico-Legal purposes, it is also important to point out that variation in procedural techniques and pharmacological choices are the acceptable norm. The indications, contraindications, technique, and results of the above procedure should only be interpreted and judged by a Board-Certified Interventional Pain Specialist with extensive familiarity and expertise in the same exact procedure and technique.

## 2018-06-23 ENCOUNTER — Other Ambulatory Visit: Payer: Self-pay

## 2018-06-23 ENCOUNTER — Ambulatory Visit (HOSPITAL_BASED_OUTPATIENT_CLINIC_OR_DEPARTMENT_OTHER): Payer: Medicare Other | Admitting: Pain Medicine

## 2018-06-23 ENCOUNTER — Ambulatory Visit
Admission: RE | Admit: 2018-06-23 | Discharge: 2018-06-23 | Disposition: A | Discharge status: Home / Self Care | Payer: Medicare Other | Source: Ambulatory Visit | Attending: Pain Medicine | Admitting: Pain Medicine

## 2018-06-23 ENCOUNTER — Encounter: Payer: Self-pay | Admitting: Pain Medicine

## 2018-06-23 VITALS — BP 179/88 | HR 64 | Temp 97.9°F | Resp 17 | Ht 69.0 in | Wt 147.0 lb

## 2018-06-23 DIAGNOSIS — M5134 Other intervertebral disc degeneration, thoracic region: Secondary | ICD-10-CM | POA: Diagnosis not present

## 2018-06-23 DIAGNOSIS — M546 Pain in thoracic spine: Secondary | ICD-10-CM

## 2018-06-23 DIAGNOSIS — M5414 Radiculopathy, thoracic region: Secondary | ICD-10-CM

## 2018-06-23 DIAGNOSIS — M5114 Intervertebral disc disorders with radiculopathy, thoracic region: Secondary | ICD-10-CM | POA: Insufficient documentation

## 2018-06-23 DIAGNOSIS — G8912 Acute post-thoracotomy pain: Secondary | ICD-10-CM | POA: Diagnosis present

## 2018-06-23 DIAGNOSIS — G894 Chronic pain syndrome: Secondary | ICD-10-CM

## 2018-06-23 MED ORDER — OXYCODONE-ACETAMINOPHEN 5-325 MG PO TABS: 1.0000 | ORAL_TABLET | Freq: Four times a day (QID) | ORAL | 0 refills | Status: DC | PRN

## 2018-06-23 MED ORDER — DEXAMETHASONE SODIUM PHOSPHATE 10 MG/ML IJ SOLN
10.0000 mg | Freq: Once | INTRAMUSCULAR | Status: AC
  Administered 2018-06-23: 10 mg
  Filled 2018-06-23: qty 1

## 2018-06-23 MED ORDER — ROPIVACAINE HCL 2 MG/ML IJ SOLN
2.0000 mL | Freq: Once | INTRAMUSCULAR | Status: AC
  Administered 2018-06-23: 2 mL via EPIDURAL
  Filled 2018-06-23: qty 10

## 2018-06-23 MED ORDER — SODIUM CHLORIDE 0.9 % IJ SOLN
INTRAMUSCULAR | Status: AC
  Filled 2018-06-23: qty 10

## 2018-06-23 MED ORDER — LIDOCAINE HCL 2 % IJ SOLN
20.0000 mL | Freq: Once | INTRAMUSCULAR | Status: AC
  Administered 2018-06-23: 400 mg
  Filled 2018-06-23: qty 40

## 2018-06-23 MED ORDER — IOPAMIDOL (ISOVUE-M 200) INJECTION 41%
10.0000 mL | Freq: Once | INTRAMUSCULAR | Status: AC
  Administered 2018-06-23: 10 mL via EPIDURAL
  Filled 2018-06-23: qty 10

## 2018-06-23 MED ORDER — SODIUM CHLORIDE 0.9% FLUSH
2.0000 mL | Freq: Once | INTRAVENOUS | Status: AC
  Administered 2018-06-23: 2 mL

## 2018-06-23 NOTE — Progress Notes (Signed)
Patient's Name: Rick GHOLSON Sr.  MRN: [id number]  Referring Provider: [facility], [place]  DOB: January 09, 1951  PCP: [facility], [place]  DOS: 06/23/2018  Note by: Gaspar Cola, MD  Service setting: Ambulatory outpatient  Specialty: Interventional Pain Management  Patient type: Established  Location: [facility] (AMB) Pain Management Facility  Visit type: Interventional Procedure   Primary Reason for Visit: Interventional Pain Management Treatment. CC: Back Pain (middle)  Procedure:          Anesthesia, Analgesia, Anxiolysis:  Type: Palliative Inter-Laminar Thoracic Epidural Block  #4  Region: Posterior Thoracolumbar Level: T8-9 Laterality: Midline       Type: Local Anesthesia Indication(s): Analgesia         Route: Infiltration ([place]/IM) IV Access: Declined Sedation: Declined  Local Anesthetic: Lidocaine 1-2%   Indications: 1. DDD (degenerative disc disease), thoracic   2. Post-thoracotomy pain syndrome   3. Thoracic radicular pain (Right)   4. Thoracic spine pain    Pain Score: Pre-procedure: 5 /10 Post-procedure: 0-No pain/10  Pre-op Assessment:  Rick Carter is a 67 y.o. (year old), male patient, seen today for interventional treatment. He  has a past surgical history that includes Tonsillectomy; Lung removal, partial; and Neck surgery. Mr. Heinlen has a current medication list which includes the following prescription(s): aclidinium bromide, advair diskus, albuterol, aspirin ec, calcium carb-cholecalciferol, cholecalciferol, diphenhydramine, fluticasone-salmeterol, hydrochlorothiazide, hydroxyzine, ipratropium-albuterol, loratadine, metformin, metformin, methocarbamol, multiple vitamins-minerals, oxycodone-acetaminophen, pantoprazole, pregabalin, sildenafil, sodium chloride hypertonic, trazodone, tudorza pressair, ventolin hfa, docusate sodium, ibuprofen, oxycodone-acetaminophen, and oxycodone-acetaminophen. His primarily concern today is the Back Pain (middle)  Initial Vital Signs:   Pulse/HCG Rate: 64ECG Heart Rate: 75 Temp: 97.9 F (36.6 C) Resp: 18 BP: (!) 163/86 SpO2: 99 %  BMI: Estimated body mass index is 21.71 kg/m as calculated from the following:   Height as of this encounter: _0  (1.753 m).   Weight as of this encounter: 147 lb (66.7 kg).  Risk Assessment: Allergies: Reviewed. He is allergic to atorvastatin and varenicline.  Allergy Precautions: None required Coagulopathies: Reviewed. None identified.  Blood-thinner therapy: None at this time Active Infection(s): Reviewed. None identified. Rick Carter is afebrile  Site Confirmation: Rick Carter was asked to confirm the procedure and laterality before marking the site Procedure checklist: Completed Consent: Before the procedure and under the influence of no sedative(s), amnesic(s), or anxiolytics, the patient was informed of the treatment options, risks and possible complications. To fulfill our ethical and legal obligations, as recommended by the American Medical Association's Code of Ethics, I have informed the patient of my clinical impression; the nature and purpose of the treatment or procedure; the risks, benefits, and possible complications of the intervention; the alternatives, including doing nothing; the risk(s) and benefit(s) of the alternative treatment(s) or procedure(s); and the risk(s) and benefit(s) of doing nothing. The patient was provided information about the general risks and possible complications associated with the procedure. These may include, but are not limited to: failure to achieve desired goals, infection, bleeding, organ or nerve damage, allergic reactions, paralysis, and death. In addition, the patient was informed of those risks and complications associated to Spine-related procedures, such as failure to decrease pain; infection (i.e.: Meningitis, epidural or intraspinal abscess); bleeding (i.e.: epidural hematoma, subarachnoid hemorrhage, or any other type of intraspinal or  peri-dural bleeding); organ or nerve damage (i.e.: Any type of peripheral nerve, nerve root, or spinal cord injury) with subsequent damage to sensory, motor, and/or autonomic systems, resulting in permanent pain, numbness, and/or weakness of one or  several areas of the body; allergic reactions; (i.e.: anaphylactic reaction); and/or death. Furthermore, the patient was informed of those risks and complications associated with the medications. These include, but are not limited to: allergic reactions (i.e.: anaphylactic or anaphylactoid reaction(s)); adrenal axis suppression; blood sugar elevation that in diabetics may result in ketoacidosis or comma; water retention that in patients with history of congestive heart failure may result in shortness of breath, pulmonary edema, and decompensation with resultant heart failure; weight gain; swelling or edema; medication-induced neural toxicity; particulate matter embolism and blood vessel occlusion with resultant organ, and/or nervous system infarction; and/or aseptic necrosis of one or more joints. Finally, the patient was informed that Medicine is not an exact science; therefore, there is also the possibility of unforeseen or unpredictable risks and/or possible complications that may result in a catastrophic outcome. The patient indicated having understood very clearly. We have given the patient no guarantees and we have made no promises. Enough time was given to the patient to ask questions, all of which were answered to the patient's satisfaction. Rick Carter has indicated that he wanted to continue with the procedure. Attestation: I, the ordering provider, attest that I have discussed with the patient the benefits, risks, side-effects, alternatives, likelihood of achieving goals, and potential problems during recovery for the procedure that I have provided informed consent. Date  Time: 06/23/2018 10:12 AM  Pre-Procedure Preparation:  Monitoring: As per clinic  protocol. Respiration, ETCO2, SpO2, BP, heart rate and rhythm monitor placed and checked for adequate function Safety Precautions: Patient was assessed for positional comfort and pressure points before starting the procedure. Time-out: I initiated and conducted the "Time-out" before starting the procedure, as per protocol. The patient was asked to participate by confirming the accuracy of the "Time Out" information. Verification of the correct person, site, and procedure were performed and confirmed by me, the nursing staff, and the patient. "Time-out" conducted as per Joint Commission's Universal Protocol (UP.01.01.01). Time: 1052  Description of Procedure:          Position: Prone Target Area: For Epidural Steroid injection(s), the target area is the  interlaminar space, initially targeting the lower border of the superior vertebral body lamina. Approach: Interlaminar approach. Area Prepped: Entire Posterior Thoracolumbar Region Prepping solution: ChloraPrep (2% chlorhexidine gluconate and 70% isopropyl alcohol) Safety Precautions: Aspiration looking for blood return was conducted prior to all injections. At no point did we inject any substances, as a needle was being advanced. No attempts were made at seeking any paresthesias. Safe injection practices and needle disposal techniques used. Medications properly checked for expiration dates. SDV (single dose vial) medications used. Description of the Procedure: Protocol guidelines were followed. The patient was placed in position over the fluoroscopy table. The target area was identified and the area prepped in the usual manner. Skin & deeper tissues infiltrated with local anesthetic. Appropriate amount of time allowed to pass for local anesthetics to take effect. The procedure needles were then advanced to the target area. The inferior aspect of the superior lamina was contacted and the needle walked caudad, until the lamina was cleared. The epidural  space was identified using "loss-of-resistance technique" with 0.9% PF-NSS (2-30m), in a low friction 10cc LOR glass syringe. Proper needle placement was secured. Negative aspiration confirmed. Solution injected in intermittent fashion, asking for systemic symptoms every 0.5 cc of injectate. The needles were then removed and the area cleansed, making sure to leave some of the prepping solution behind to take advantage of its long term  bactericidal properties. Vitals:   06/23/18 1045 06/23/18 1049 06/23/18 1054 06/23/18 1059  BP: (!) 184/79 (!) 186/81 (!) 179/86 (!) 179/88  Pulse:      Resp: _0 Temp:      TempSrc:      SpO2: 99% 99% 98% 98%  Weight:      Height:        Start Time: 1052 hrs. End Time: 1058 hrs. Materials:  Needle(s) Type: Epidural needle Gauge: 17G Length: 3.5-in Medication(s): Please see orders for medications and dosing details.  Imaging Guidance (Spinal):          Type of Imaging Technique: Fluoroscopy Guidance (Spinal) Indication(s): Assistance in needle guidance and placement for procedures requiring needle placement in or near specific anatomical locations not easily accessible without such assistance. Exposure Time: Please see nurses notes. Contrast: Before injecting any contrast, we confirmed that the patient did not have an allergy to iodine, shellfish, or radiological contrast. Once satisfactory needle placement was completed at the desired level, radiological contrast was injected. Contrast injected under live fluoroscopy. No contrast complications. See chart for type and volume of contrast used. Fluoroscopic Guidance: I was personally present during the use of fluoroscopy. "Tunnel Vision Technique" used to obtain the best possible view of the target area. Parallax error corrected before commencing the procedure. "Direction-depth-direction" technique used to introduce the needle under continuous pulsed fluoroscopy. Once target was reached,  antero-posterior, oblique, and lateral fluoroscopic projection used confirm needle placement in all planes. Images permanently stored in EMR. Interpretation: I personally interpreted the imaging intraoperatively. Adequate needle placement confirmed in multiple planes. Appropriate spread of contrast into desired area was observed. No evidence of afferent or efferent intravascular uptake. No intrathecal or subarachnoid spread observed. Permanent images saved into the patient's record.  Antibiotic Prophylaxis:   Anti-infectives (From admission, onward)   None     Indication(s): None identified  Post-operative Assessment:  Post-procedure Vital Signs:  Pulse/HCG Rate: 6477 Temp: 97.9 F (36.6 C) Resp: 17 BP: (!) 179/88 SpO2: 98 %  EBL: None  Complications: No immediate post-treatment complications observed by team, or reported by patient.  Note: The patient tolerated the entire procedure well. A repeat set of vitals were taken after the procedure and the patient was kept under observation following institutional policy, for this type of procedure. Post-procedural neurological assessment was performed, showing return to baseline, prior to discharge. The patient was provided with post-procedure discharge instructions, including a section on how to identify potential problems. Should any problems arise concerning this procedure, the patient was given instructions to immediately contact us, at any time, without hesitation. In any case, we plan to contact the patient by telephone for a follow-up status report regarding this interventional procedure.  Comments:  No additional relevant information.  Plan of Care    Imaging Orders     DG C-Arm 1-60 Min-No Report  Procedure Orders     Thoracic Epidural Injection  Medications ordered for procedure: Meds ordered this encounter  Medications  . iopamidol (ISOVUE-M) 41 % intrathecal injection 10 mL    Must be Myelogram-compatible. If not  available, you may substitute with a water-soluble, non-ionic, hypoallergenic, myelogram-compatible radiological contrast medium.  Marland Kitchen lidocaine (XYLOCAINE) 2 % (with pres) injection 400 mg  . sodium chloride flush (NS) 0.9 % injection 2 mL  . ropivacaine (PF) 2 mg/mL (0.2%) (NAROPIN) injection 2 mL  . dexamethasone (DECADRON) injection 10 mg  . oxyCODONE-acetaminophen (PERCOCET/ROXICET) 5-325 MG tablet    Sig: Take  1 tablet by mouth every 6 (six) hours as needed for severe pain.    Dispense:  120 tablet    Refill:  0    Do not place this medication, or any other prescription from our practice, on "Automatic Refill". Patient may have prescription filled one day early if pharmacy is closed on scheduled refill date. Do not fill until: 08/01/18 To last until: 08/31/18  . oxyCODONE-acetaminophen (PERCOCET/ROXICET) 5-325 MG tablet    Sig: Take 1 tablet by mouth every 6 (six) hours as needed for severe pain.    Dispense:  120 tablet    Refill:  0    Do not place this medication, or any other prescription from our practice, on "Automatic Refill". Patient may have prescription filled one day early if pharmacy is closed on scheduled refill date. Do not fill until: 07/02/18 To last until: 08/01/18  . oxyCODONE-acetaminophen (PERCOCET/ROXICET) 5-325 MG tablet    Sig: Take 1 tablet by mouth every 6 (six) hours as needed for severe pain.    Dispense:  120 tablet    Refill:  0    Do not place this medication, or any other prescription from our practice, on "Automatic Refill". Patient may have prescription filled one day early if pharmacy is closed on scheduled refill date. Do not fill until: 08/31/18 To last until: 09/30/18   Medications administered: We administered iopamidol, lidocaine, sodium chloride flush, ropivacaine (PF) 2 mg/mL (0.2%), and dexamethasone.  See the medical record for exact dosing, route, and time of administration.  New Prescriptions   No medications on file   Disposition:  Discharge home  Discharge Date & Time: 06/23/2018; 1105 hrs.   Physician-requested Follow-up: Return for post-procedure eval (2 wks), w/ Dionisio David, NP.  Future Appointments  Date Time Provider [place]  07/07/2018 11:15 AM Vevelyn Francois, NP [facility] None   Primary Care Physician: [facility], [place] Location: [facility] Management Facility Note by: Gaspar Cola, MD Date: 06/23/2018; Time: 11:06 AM  Disclaimer:  Medicine is not an exact science. The only guarantee in medicine is that nothing is guaranteed. It is important to note that the decision to proceed with this intervention was based on the information collected from the patient. The Data and conclusions were drawn from the patient's questionnaire, the interview, and the physical examination. Because the information was provided in large part by the patient, it cannot be guaranteed that it has not been purposely or unconsciously manipulated. Every effort has been made to obtain as much relevant data as possible for this evaluation. It is important to note that the conclusions that lead to this procedure are derived in large part from the available data. Always take into account that the treatment will also be dependent on availability of resources and existing treatment guidelines, considered by other Pain Management Practitioners as being common knowledge and practice, at the time of the intervention. For Medico-Legal purposes, it is also important to point out that variation in procedural techniques and pharmacological choices are the acceptable norm. The indications, contraindications, technique, and results of the above procedure should only be interpreted and judged by a Board-Certified Interventional Pain Specialist with extensive familiarity and expertise in the same exact procedure and technique.

## 2018-06-23 NOTE — Patient Instructions (Signed)

## 2018-06-24 ENCOUNTER — Telehealth: Payer: Self-pay

## 2018-06-24 NOTE — Telephone Encounter (Signed)
Called patient. Wife answered phone, Mr Byard sleeping. She states that he is dong fine. Instructed to call if needed.

## 2018-07-07 ENCOUNTER — Encounter: Payer: Self-pay | Admitting: Nurse Practitioner

## 2018-07-07 ENCOUNTER — Other Ambulatory Visit: Payer: Self-pay

## 2018-07-07 ENCOUNTER — Ambulatory Visit: Payer: Medicare Other | Attending: Nurse Practitioner | Admitting: Nurse Practitioner

## 2018-07-07 VITALS — BP 145/67 | HR 73 | Temp 98.0°F | Resp 16 | Ht 69.0 in | Wt 131.0 lb

## 2018-07-07 DIAGNOSIS — G8929 Other chronic pain: Secondary | ICD-10-CM

## 2018-07-07 DIAGNOSIS — J449 Chronic obstructive pulmonary disease, unspecified: Secondary | ICD-10-CM | POA: Diagnosis not present

## 2018-07-07 DIAGNOSIS — I10 Essential (primary) hypertension: Secondary | ICD-10-CM | POA: Insufficient documentation

## 2018-07-07 DIAGNOSIS — M7918 Myalgia, other site: Secondary | ICD-10-CM | POA: Diagnosis not present

## 2018-07-07 DIAGNOSIS — Z79891 Long term (current) use of opiate analgesic: Secondary | ICD-10-CM

## 2018-07-07 DIAGNOSIS — M47816 Spondylosis without myelopathy or radiculopathy, lumbar region: Secondary | ICD-10-CM | POA: Diagnosis not present

## 2018-07-07 DIAGNOSIS — M48061 Spinal stenosis, lumbar region without neurogenic claudication: Secondary | ICD-10-CM | POA: Diagnosis not present

## 2018-07-07 DIAGNOSIS — Z5181 Encounter for therapeutic drug level monitoring: Secondary | ICD-10-CM | POA: Diagnosis present

## 2018-07-07 DIAGNOSIS — K5900 Constipation, unspecified: Secondary | ICD-10-CM | POA: Insufficient documentation

## 2018-07-07 DIAGNOSIS — M5116 Intervertebral disc disorders with radiculopathy, lumbar region: Secondary | ICD-10-CM | POA: Insufficient documentation

## 2018-07-07 DIAGNOSIS — Z7982 Long term (current) use of aspirin: Secondary | ICD-10-CM | POA: Diagnosis not present

## 2018-07-07 DIAGNOSIS — M792 Neuralgia and neuritis, unspecified: Secondary | ICD-10-CM

## 2018-07-07 DIAGNOSIS — Z79899 Other long term (current) drug therapy: Secondary | ICD-10-CM | POA: Insufficient documentation

## 2018-07-07 DIAGNOSIS — M4726 Other spondylosis with radiculopathy, lumbar region: Secondary | ICD-10-CM | POA: Insufficient documentation

## 2018-07-07 DIAGNOSIS — M65332 Trigger finger, left middle finger: Secondary | ICD-10-CM | POA: Insufficient documentation

## 2018-07-07 DIAGNOSIS — M5414 Radiculopathy, thoracic region: Secondary | ICD-10-CM

## 2018-07-07 DIAGNOSIS — G894 Chronic pain syndrome: Secondary | ICD-10-CM | POA: Insufficient documentation

## 2018-07-07 DIAGNOSIS — M5412 Radiculopathy, cervical region: Secondary | ICD-10-CM

## 2018-07-07 DIAGNOSIS — F17210 Nicotine dependence, cigarettes, uncomplicated: Secondary | ICD-10-CM | POA: Insufficient documentation

## 2018-07-07 DIAGNOSIS — E785 Hyperlipidemia, unspecified: Secondary | ICD-10-CM | POA: Insufficient documentation

## 2018-07-07 MED ORDER — PREGABALIN 150 MG PO CAPS: 150.0000 mg | ORAL_CAPSULE | Freq: Two times a day (BID) | ORAL | 0 refills | Status: DC

## 2018-07-07 NOTE — Patient Instructions (Signed)
____________________________________________________________________________________________  Medication Rules  Applies to: All patients receiving prescriptions (written or electronic).  Pharmacy of record: Pharmacy where electronic prescriptions will be sent. If written prescriptions are taken to a different pharmacy, please inform the nursing staff. The pharmacy listed in the electronic medical record should be the one where you would like electronic prescriptions to be sent.  Prescription refills: Only during scheduled appointments. Applies to both, written and electronic prescriptions.  NOTE: The following applies primarily to controlled substances (Opioid* Pain Medications).   Patient's responsibilities: 1. Pain Pills: Bring all pain pills to every appointment (except for procedure appointments). 2. Pill Bottles: Bring pills in original pharmacy bottle. Always bring newest bottle. Bring bottle, even if empty. 3. Medication refills: You are responsible for knowing and keeping track of what medications you need refilled. The day before your appointment, write a list of all prescriptions that need to be refilled. Bring that list to your appointment and give it to the admitting nurse. Prescriptions will be written only during appointments. If you forget a medication, it will not be "Called in", "Faxed", or "electronically sent". You will need to get another appointment to get these prescribed. 4. Prescription Accuracy: You are responsible for carefully inspecting your prescriptions before leaving our office. Have the discharge nurse carefully go over each prescription with you, before taking them home. Make sure that your name is accurately spelled, that your address is correct. Check the name and dose of your medication to make sure it is accurate. Check the number of pills, and the written instructions to make sure they are clear and accurate. Make sure that you are given enough medication to last  until your next medication refill appointment. 5. Taking Medication: Take medication as prescribed. Never take more pills than instructed. Never take medication more frequently than prescribed. Taking less pills or less frequently is permitted and encouraged, when it comes to controlled substances (written prescriptions).  6. Inform other Doctors: Always inform, all of your healthcare providers, of all the medications you take. 7. Pain Medication from other Providers: You are not allowed to accept any additional pain medication from any other Doctor or Healthcare provider. There are two exceptions to this rule. (see below) In the event that you require additional pain medication, you are responsible for notifying us, as stated below. 8. Medication Agreement: You are responsible for carefully reading and following our Medication Agreement. This must be signed before receiving any prescriptions from our practice. Safely store a copy of your signed Agreement. Violations to the Agreement will result in no further prescriptions. (Additional copies of our Medication Agreement are available upon request.) 9. Laws, Rules, & Regulations: All patients are expected to follow all Federal and State Laws, Statutes, Rules, & Regulations. Ignorance of the Laws does not constitute a valid excuse. The use of any illegal substances is prohibited. 10. Adopted CDC guidelines & recommendations: Target dosing levels will be at or below 60 MME/day. Use of benzodiazepines** is not recommended.  Exceptions: There are only two exceptions to the rule of not receiving pain medications from other Healthcare Providers. 1. Exception #1 (Emergencies): In the event of an emergency (i.e.: accident requiring emergency care), you are allowed to receive additional pain medication. However, you are responsible for: As soon as you are able, call our office [phone], at any time of the day or night, and leave a message stating your name, the  date and nature of the emergency, and the name and dose of the medication   prescribed. In the event that your call is answered by a member of our staff, make sure to document and save the date, time, and the name of the person that took your information.  2. Exception #2 (Planned Surgery): In the event that you are scheduled by another doctor or dentist to have any type of surgery or procedure, you are allowed (for a period no longer than 30 days), to receive additional pain medication, for the acute post-op pain. However, in this case, you are responsible for picking up a copy of our "Post-op Pain Management for Surgeons" handout, and giving it to your surgeon or dentist. This document is available at our office, and does not require an appointment to obtain it. Simply go to our office during business hours (Monday-Thursday from 8:00 AM to 4:00 PM) (Friday 8:00 AM to 12:00 Noon) or if you have a scheduled appointment with us, prior to your surgery, and ask for it by name. In addition, you will need to provide us with your name, name of your surgeon, type of surgery, and date of procedure or surgery.  *Opioid medications include: morphine, codeine, oxycodone, oxymorphone, hydrocodone, hydromorphone, meperidine, tramadol, tapentadol, buprenorphine, fentanyl, methadone. **Benzodiazepine medications include: diazepam (Valium), alprazolam (Xanax), clonazepam (Klonopine), lorazepam (Ativan), clorazepate (Tranxene), chlordiazepoxide (Librium), estazolam (Prosom), oxazepam (Serax), temazepam (Restoril), triazolam (Halcion) (Last updated: 12/11/2017) ____________________________________________________________________________________________    

## 2018-07-07 NOTE — Progress Notes (Signed)
Patient's Name: Rick CUDD Sr.  MRN: [id number]  Referring Provider: Gila  DOB: 11-30-1950  PCP: [facility] [place]  DOS: 07/07/2018  Note by: Vevelyn Francois NP  Service setting: Ambulatory outpatient  Specialty: Interventional Pain Management  Location: [facility] (AMB) Pain Management Facility    Patient type: Established    Primary Reason(s) for Visit: Encounter for prescription drug management & post-procedure evaluation of chronic illness with mild to moderate exacerbation(Level of risk: moderate) CC: Back Pain (lower) and Neck Pain  HPI  Rick Carter is a 67 y.o. year old, male patient, who comes today for a post-procedure evaluation and medication management. 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[place]); Atrial fibrillation ([place]); Bipolar affective disorder ([place]); COPD exacerbation ([place]); DDD (degenerative disc disease), cervical; Colon, diverticulosis; Acid reflux; Essential (primary) hypertension; HLD (hyperlipidemia); Breath shortness; Compulsive tobacco user syndrome; Diverticular disease of large intestine; Post-thoracotomy pain syndrome; Pharmacologic therapy; Long term current use of opiate analgesic; Long term prescription opiate use; Uncomplicated opioid dependence ([place]); Opiate use (30 MME/Day); Fibromyalgia; Chronic pain syndrome; Lumbar facet syndrome (Bilateral) (R>L); History of alcoholism ([place]); Chronic low back pain (Primary Area of Pain) (midline); Lumbar spondylosis; Cervical spondylosis; Failed cervical surgery syndrome; Nicotine dependence; Tobacco abuse; Substance Use Disorder Risk: High; History of right-sided partial pneumonectomy; Atherosclerotic peripheral vascular disease ([place]); Musculoskeletal pain; Neurogenic pain; Neuropathic pain; Opioid-induced constipation (OIC); Osteoarthritis; NSAID induced gastritis; Thoracic spine pain; Thoracic radicular pain (Right); Chronic lumbar radicular pain (L5/S1 dermatome) (Right); Lumbar foraminal stenosis  (L5-S1) (Right); Lumbar facet arthropathy (Bilateral) (R>L); Lumbar disc protrusion (L3-4) (Right); Chronic cervical radicular pain (Left); Tumor of parotid gland; Trigger finger (middle finger) (Left); Disturbance of skin sensation; Pain of lumbar spine (L2-3 interspinous ligament); Chronic lower extremity pain (Secondary Area of Pain) (Right); DDD (degenerative disc disease), lumbar; Failed back surgical syndrome; Chronic upper extremity pain (Left); Pleomorphic adenoma of parotid gland; Disorder of skeletal system; Problems influencing health status; and DDD (degenerative disc disease), thoracic on their problem list. His primarily concern today is the Back Pain (lower) and Neck Pain  Pain Assessment: Location: Lower Back Radiating: hip/buttocks down leg to foot, effects toes Onset: More than a month ago Duration: Chronic pain Quality: Aching, Constant, Discomfort, Sharp, Throbbing Severity: 3 /10 (subjective, self-reported pain score)  Note: Reported level is compatible with observation.                          Effect on ADL: prolonged walking, standing Timing: Constant Modifying factors: rest, lying down BP: (!) 145/67  HR: 73  Rick Carter was last seen on 06/24/2018 for a procedure. During today's appointment we reviewed Mr. Karlen's post-procedure results, as well as his outpatient medication regimen.  He has been in the hospital recently. He admits that he had a panic attack and was not able to breath. He admits that he has had some medication changes. He was started on Zoloft but feels like he had a reaction. He is going to change to citalopram and Clonazepam. He admits that he is under increased stress at home.  He admits that he is going to follow with his primary care provider along with psychiatry.  Further details on both, my assessment(s), as well as the proposed treatment plan, please see below.  Post-Procedure Assessment  06/23/2018 Procedure: palliative right-sided T8-9 thoracic  ESI  Pre-procedure pain score:  5/10 Post-procedure pain score: 0/10         Influential Factors: BMI:  19.35 kg/m Intra-procedural challenges: None observed.         Assessment challenges: None detected.              Reported side-effects: None.        Post-procedural adverse reactions or complications: None reported         Sedation: Please see nurses note. When no sedatives are used, the analgesic levels obtained are directly associated to the effectiveness of the local anesthetics. However, when sedation is provided, the level of analgesia obtained during the initial 1 hour following the intervention, is believed to be the result of a combination of factors. These factors may include, but are not limited to: 1. The effectiveness of the local anesthetics used. 2. The effects of the analgesic(s) and/or anxiolytic(s) used. 3. The degree of discomfort experienced by the patient at the time of the procedure. 4. The patients ability and reliability in recalling and recording the events. 5. The presence and influence of possible secondary gains and/or psychosocial factors. Reported result: Relief experienced during the 1st hour after the procedure: 100 % (Ultra-Short Term Relief)            Interpretative annotation: Clinically appropriate result. Analgesia during this period is likely to be Local Anesthetic and/or IV Sedative (Analgesic/Anxiolytic) related.          Effects of local anesthetic: The analgesic effects attained during this period are directly associated to the localized infiltration of local anesthetics and therefore cary significant diagnostic value as to the etiological location, or anatomical origin, of the pain. Expected duration of relief is directly dependent on the pharmacodynamics of the local anesthetic used. Long-acting (4-6 hours) anesthetics used.  Reported result: Relief during the next 4 to 6 hour after the procedure: 100 % (Short-Term Relief)            Interpretative  annotation: Clinically appropriate result. Analgesia during this period is likely to be Local Anesthetic-related.          Long-term benefit: Defined as the period of time past the expected duration of local anesthetics (1 hour for short-acting and 4-6 hours for long-acting). With the possible exception of prolonged sympathetic blockade from the local anesthetics, benefits during this period are typically attributed to, or associated with, other factors such as analgesic sensory neuropraxia, antiinflammatory effects, or beneficial biochemical changes provided by agents other than the local anesthetics.  Reported result: Extended relief following procedure: 80 % (Long-Term Relief)            Interpretative annotation: Clinically possible results. Good relief. No permanent benefit expected. Inflammation plays a part in the etiology to the pain.          Current benefits: Defined as reported results that persistent at this point in time.   Analgesia: >75 %            Function: Somewhat improved ROM: Somewhat improved Interpretative annotation: Ongoing benefit.              He is very pleased  Interpretation: Results would suggest a successful therapeutic intervention.                  Plan:  Please see "Plan of Care" for details.                Laboratory Chemistry  Inflammation Markers (CRP: Acute Phase) (ESR: Chronic Phase) Lab Results  Component Value Date   CRP 2 04/06/2018   ESRSEDRATE 4 04/06/2018  Rheumatology Markers No results found for: RF, ANA, LABURIC, URICUR, LYMEIGGIGMAB, LYMEABIGMQN, HLAB27                      Renal Function Markers Lab Results  Component Value Date   BUN 9 04/06/2018   CREATININE 0.84 04/06/2018   BCR 11 04/06/2018   GFRAA 104 04/06/2018   GFRNONAA 90 04/06/2018                             Hepatic Function Markers Lab Results  Component Value Date   AST 18 04/06/2018   ALT 24 01/06/2017   ALBUMIN 4.6 04/06/2018    ALKPHOS 64 04/06/2018                        Electrolytes Lab Results  Component Value Date   NA 144 04/06/2018   K 4.2 04/06/2018   CL 102 04/06/2018   CALCIUM 9.8 04/06/2018   MG 2.1 04/06/2018                        Neuropathy Markers Lab Results  Component Value Date   WVPXTGGY69 485 04/06/2018                        CNS Tests No results found for: COLORCSF, APPEARCSF, RBCCOUNTCSF, WBCCSF, POLYSCSF, LYMPHSCSF, EOSCSF, PROTEINCSF, GLUCCSF, JCVIRUS, CSFOLI, IGGCSF                      Bone Pathology Markers Lab Results  Component Value Date   25OHVITD1 76 04/06/2018   25OHVITD2 <1.0 04/06/2018   25OHVITD3 76 04/06/2018                         Coagulation Parameters No results found for: INR, LABPROT, APTT, PLT, DDIMER                      Cardiovascular Markers No results found for: BNP, CKTOTAL, CKMB, TROPONINI, HGB, HCT                       CA Markers No results found for: CEA, CA125, LABCA2                      Note: Lab results reviewed.  Recent Diagnostic Imaging Results  DG C-Arm 1-60 Min-No Report Fluoroscopy was utilized by the requesting physician.  No radiographic  interpretation.   Complexity Note: Imaging results reviewed. Results shared with Mr. Hosking, using Layman's terms.                         Meds   Current Outpatient Medications:  .  Aclidinium Bromide (TUDORZA PRESSAIR IN), Inhale 40 mcg into the lungs 2 (two) times daily., Disp: , Rfl:  .  ADVAIR DISKUS 250-50 MCG/DOSE AEPB, INHALE 1 PUFF 2 (TWO) TIMES A DAY., Disp: , Rfl: 11 .  albuterol (PROVENTIL) (2.5 MG/3ML) 0.083% nebulizer solution, INHALE 0.5 ML (2.5 MG TOTAL) BY NEBULIZATION EVERY SIX (6) HOURS AS NEEDED FOR WHEEZING., Disp: , Rfl: 11 .  aspirin EC 81 MG tablet, Take 81 mg by mouth daily., Disp: , Rfl:  .  Calcium Carb-Cholecalciferol 600-800 MG-UNIT TABS, TAKE 1 TABLET BY MOUTH 2 TIMES A DAY, Disp: ,  Rfl:  .  cholecalciferol (VITAMIN D) 1000 UNITS tablet, Take 2,000 Units by  mouth 2 (two) times daily. , Disp: , Rfl:  .  clonazePAM (KLONOPIN) 0.5 MG tablet, , Disp: , Rfl:  .  diphenhydrAMINE (BENADRYL) 25 mg capsule, Take 25 mg by mouth 2 (two) times daily. , Disp: , Rfl:  .  escitalopram (LEXAPRO) 10 MG tablet, , Disp: , Rfl:  .  Fluticasone-Salmeterol (ADVAIR) 500-50 MCG/DOSE AEPB, Inhale 1 puff into the lungs 2 (two) times daily., Disp: , Rfl:  .  hydrochlorothiazide (MICROZIDE) 12.5 MG capsule, TAKE ONE CAPSULE BY MOUTH FOR HIGH BLOOD PRESSURE, Disp: , Rfl: 3 .  ipratropium-albuterol (DUONEB) 0.5-2.5 (3) MG/3ML SOLN, Take 3 mLs by nebulization every 6 (six) hours as needed., Disp: , Rfl:  .  loratadine (CLARITIN) 10 MG tablet, Take 10 mg by mouth. , Disp: , Rfl:  .  Multiple Vitamins-Minerals (PRESERVISION AREDS 2 PO), Take 1 tablet by mouth 2 (two) times daily., Disp: , Rfl:  .  [START ON 08/01/2018] oxyCODONE-acetaminophen (PERCOCET/ROXICET) 5-325 MG tablet, Take 1 tablet by mouth every 6 (six) hours as needed for severe pain., Disp: 120 tablet, Rfl: 0 .  oxyCODONE-acetaminophen (PERCOCET/ROXICET) 5-325 MG tablet, Take 1 tablet by mouth every 6 (six) hours as needed for severe pain., Disp: 120 tablet, Rfl: 0 .  [START ON 08/31/2018] oxyCODONE-acetaminophen (PERCOCET/ROXICET) 5-325 MG tablet, Take 1 tablet by mouth every 6 (six) hours as needed for severe pain., Disp: 120 tablet, Rfl: 0 .  pantoprazole (PROTONIX) 40 MG tablet, Take 40 mg by mouth., Disp: , Rfl:  .  sildenafil (VIAGRA) 100 MG tablet, 100 mg., Disp: , Rfl:  .  sodium chloride HYPERTONIC 3 % nebulizer solution, INHALE 4 ML BY NEBULIZATION TWICE A DAY, Disp: , Rfl: 6 .  traZODone (DESYREL) 50 MG tablet, Take 50 mg by mouth at bedtime. Reported on 02/20/2016, Disp: , Rfl:  .  TUDORZA PRESSAIR 400 MCG/ACT AEPB, Inhale 1 puff into the lungs 2 (two) times daily., Disp: , Rfl: 11 .  VENTOLIN HFA 108 (90 Base) MCG/ACT inhaler, INHALE 2 PUFFS EVERY FOUR (4) HOURS AS NEEDED FOR WHEEZING, Disp: , Rfl: 11 .   docusate sodium (COLACE) 100 MG capsule, Take 1 capsule (100 mg total) by mouth daily., Disp: 90 capsule, Rfl: 0 .  ibuprofen (ADVIL,MOTRIN) 800 MG tablet, Take 1 tablet (800 mg total) by mouth every 8 (eight) hours as needed for moderate pain., Disp: 270 tablet, Rfl: 0 .  methocarbamol (ROBAXIN) 750 MG tablet, Take 1 tablet (750 mg total) by mouth every 8 (eight) hours as needed for muscle spasms., Disp: 90 tablet, Rfl: 2 .  pregabalin (LYRICA) 150 MG capsule, Take 1 capsule (150 mg total) by mouth 2 (two) times daily., Disp: 180 capsule, Rfl: 0  ROS  Constitutional: Denies any fever or chills Gastrointestinal: No reported hemesis, hematochezia, vomiting, or acute GI distress Musculoskeletal: Denies any acute onset joint swelling, redness, loss of ROM, or weakness Neurological: No reported episodes of acute onset apraxia, aphasia, dysarthria, agnosia, amnesia, paralysis, loss of coordination, or loss of consciousness  Allergies  Mr. Elk is allergic to atorvastatin and varenicline.  PFSH  Drug: Mr. Pfluger  has no drug history on file. Alcohol:  reports that he does not drink alcohol. Tobacco:  reports that he has been smoking cigarettes. He has a 51.00 pack-year smoking history. He has never used smokeless tobacco. Medical:  has a past medical history of Anxiety, Bronchitis, COPD (chronic obstructive pulmonary disease) ([place]),  Depression, Hernia of abdominal wall, History of alcoholism ([place]) (08/08/2015), History of pneumonectomy (08/08/2015), Hypertension, Macular degeneration (08/26/2016), and Tumor cells. Surgical: Mr. Mottola  has a past surgical history that includes Tonsillectomy; Lung removal, partial; and Neck surgery. Family: family history includes Alcohol abuse in his father; Kidney disease in his mother.  Constitutional Exam  General appearance: Well nourished, well developed, and well hydrated. In no apparent acute distress Vitals:   07/07/18 1121  BP: (!) 145/67  Pulse: 73   Resp: 16  Temp: 98 F (36.7 C)  SpO2: 98%  Weight: 131 lb (59.4 kg)  Height: _0  (1.753 m)  Psych/Mental status: Alert, oriented x 3 (person, place, & time)       Eyes: PERLA Respiratory: No evidence of acute respiratory distress  Cervical Spine Area Exam  Skin & Axial Inspection: No masses, redness, edema, swelling, or associated skin lesions Alignment: Symmetrical Functional ROM: Unrestricted ROM      Stability: No instability detected Muscle Tone/Strength: Functionally intact. No obvious neuro-muscular anomalies detected. Sensory (Neurological): Unimpaired Palpation: No palpable anomalies              Upper Extremity (UE) Exam    Side: Right upper extremity  Side: Left upper extremity  Skin & Extremity Inspection: Skin color, temperature, and hair growth are WNL. No peripheral edema or cyanosis. No masses, redness, swelling, asymmetry, or associated skin lesions. No contractures.  Skin & Extremity Inspection: Skin color, temperature, and hair growth are WNL. No peripheral edema or cyanosis. No masses, redness, swelling, asymmetry, or associated skin lesions. No contractures.  Functional ROM: Unrestricted ROM          Functional ROM: Unrestricted ROM          Muscle Tone/Strength: Functionally intact. No obvious neuro-muscular anomalies detected.  Muscle Tone/Strength: Functionally intact. No obvious neuro-muscular anomalies detected.  Sensory (Neurological): Unimpaired          Sensory (Neurological): Unimpaired          Palpation: No palpable anomalies              Palpation: No palpable anomalies              Provocative Test(s):  Phalen's test: deferred Tinel's test: deferred Apley's scratch test (touch opposite shoulder):  Action 1 (Across chest): deferred Action 2 (Overhead): deferred Action 3 (LB reach): deferred   Provocative Test(s):  Phalen's test: deferred Tinel's test: deferred Apley's scratch test (touch opposite shoulder):  Action 1 (Across chest):  deferred Action 2 (Overhead): deferred Action 3 (LB reach): deferred    Thoracic Spine Area Exam  Skin & Axial Inspection: No masses, redness, or swelling Alignment: Symmetrical Functional ROM: Unrestricted ROM Stability: No instability detected Muscle Tone/Strength: Functionally intact. No obvious neuro-muscular anomalies detected. Sensory (Neurological): Unimpaired Muscle strength & Tone: No palpable anomalies  Lumbar Spine Area Exam  Skin & Axial Inspection: No masses, redness, or swelling Alignment: Symmetrical Functional ROM: Unrestricted ROM       Stability: No instability detected Muscle Tone/Strength: Functionally intact. No obvious neuro-muscular anomalies detected. Sensory (Neurological): Unimpaired Palpation: No palpable anomalies       Provocative Tests: Hyperextension/rotation test: deferred today       Lumbar quadrant test (Kemp's test): deferred today       Lateral bending test: deferred today       Patrick's Maneuver: deferred today                   FABER test: deferred today  S-I anterior distraction/compression test: deferred today         S-I lateral compression test: deferred today         S-I Thigh-thrust test: deferred today         S-I Gaenslen's test: deferred today          Gait & Posture Assessment  Ambulation: Unassisted Gait: Relatively normal for age and body habitus Posture: WNL   Lower Extremity Exam    Side: Right lower extremity  Side: Left lower extremity  Stability: No instability observed          Stability: No instability observed          Skin & Extremity Inspection: Skin color, temperature, and hair growth are WNL. No peripheral edema or cyanosis. No masses, redness, swelling, asymmetry, or associated skin lesions. No contractures.  Skin & Extremity Inspection: Skin color, temperature, and hair growth are WNL. No peripheral edema or cyanosis. No masses, redness, swelling, asymmetry, or associated skin lesions. No  contractures.  Functional ROM: Unrestricted ROM                  Functional ROM: Unrestricted ROM                  Muscle Tone/Strength: Functionally intact. No obvious neuro-muscular anomalies detected.  Muscle Tone/Strength: Functionally intact. No obvious neuro-muscular anomalies detected.  Sensory (Neurological): Unimpaired  Sensory (Neurological): Unimpaired  Palpation: No palpable anomalies  Palpation: No palpable anomalies   Assessment  Primary Diagnosis & Pertinent Problem List: The primary encounter diagnosis was Thoracic radicular pain (Right). Diagnoses of Lumbar facet arthropathy (Bilateral) (R>L), Chronic cervical radicular pain (Left), Neurogenic pain, Neuropathic pain, and Long term current use of opiate analgesic were also pertinent to this visit.  Status Diagnosis  Controlled Controlled Controlled 1. Thoracic radicular pain (Right)   2. Lumbar facet arthropathy (Bilateral) (R>L)   3. Chronic cervical radicular pain (Left)   4. Neurogenic pain   5. Neuropathic pain   6. Long term current use of opiate analgesic     Problems updated and reviewed during this visit: Problem  Copd Exacerbation ([facility])   Plan of Care  Pharmacotherapy (Medications Ordered): Meds ordered this encounter  Medications  . pregabalin (LYRICA) 150 MG capsule    Sig: Take 1 capsule (150 mg total) by mouth 2 (two) times daily.    Dispense:  180 capsule    Refill:  0    Do not place this medication, or any other prescription from our practice, on "Automatic Refill". Patient may have prescription filled one day early if pharmacy is closed on scheduled refill date.    Order Specific Question:   Supervising Provider    Answer:   Milinda Pointer [[id number]]   New Prescriptions   No medications on file   Medications administered today: Sr. Pat Patrick Sr. had no medications administered during this visit. Lab-work, procedure(s), and/or referral(s): No orders of the defined types were placed  in this encounter.  Imaging and/or referral(s): None  Interventional management options: Planned, scheduled, and/or pending:   Not at this time.    Considering:   Palliative/therapeuticbilateral lumbar facet block Palliative/therapeutic left-sided cervical ESI  Palliative/therapeutic right-sided T8-9 thoracic ESI  Palliative/therapeutic right-sided L5-S1 TFESI  Diagnostic right-sided L3-4 TFESI  Palliative/therapeutic right-sided L4-5 interlaminar ESI  Palliative/therapeutic left hand, ring finger, trigger finger injection    Palliative PRN treatment(s):   Palliativebilateral lumbar facet block Palliative left-sided cervical ESI  Palliative right-sided T8-9 thoracic ESI  Palliative right-sided L5-S1 TFESI  Palliative right-sided L4-5 interlaminar ESI  Palliative left hand, ring finger, trigger finger injection     Provider-requested follow-up: Return for Appointment As Scheduled.  Future Appointments  Date Time Provider [place]  09/22/2018 11:00 AM Vevelyn Francois, NP [facility] None   Primary Care Physician: [place] Location: [facility] Outpatient Pain Management Facility Note by: Vevelyn Francois NP Date: 07/07/2018; Time: 1:16 PM  Pain Score Disclaimer: We use the NRS-11 scale. This is a self-reported, subjective measurement of pain severity with only modest accuracy. It is used primarily to identify changes within a particular patient. It must be understood that outpatient pain scales are significantly less accurate that those used for research, where they can be applied under ideal controlled circumstances with minimal exposure to variables. In reality, the score is likely to be a combination of pain intensity and pain affect, where pain affect describes the degree of emotional arousal or changes in action readiness caused by the sensory experience of pain. Factors such as social and work situation, setting, emotional state, anxiety levels, expectation,  and prior pain experience may influence pain perception and show large inter-individual differences that may also be affected by time variables.  Patient instructions provided during this appointment: Patient Instructions  ____________________________________________________________________________________________  Medication Rules  Applies to: All patients receiving prescriptions (written or electronic).  Pharmacy of record: Pharmacy where electronic prescriptions will be sent. If written prescriptions are taken to a different pharmacy, please inform the nursing staff. The pharmacy listed in the electronic medical record should be the one where you would like electronic prescriptions to be sent.  Prescription refills: Only during scheduled appointments. Applies to both, written and electronic prescriptions.  NOTE: The following applies primarily to controlled substances (Opioid* Pain Medications).   Patient's responsibilities: 1. Pain Pills: Bring all pain pills to every appointment (except for procedure appointments). 2. Pill Bottles: Bring pills in original pharmacy bottle. Always bring newest bottle. Bring bottle, even if empty. 3. Medication refills: You are responsible for knowing and keeping track of what medications you need refilled. The day before your appointment, write a list of all prescriptions that need to be refilled. Bring that list to your appointment and give it to the admitting nurse. Prescriptions will be written only during appointments. If you forget a medication, it will not be "Called in", "Faxed", or "electronically sent". You will need to get another appointment to get these prescribed. 4. Prescription Accuracy: You are responsible for carefully inspecting your prescriptions before leaving our office. Have the discharge nurse carefully go over each prescription with you, before taking them home. Make sure that your name is accurately spelled, that your address is  correct. Check the name and dose of your medication to make sure it is accurate. Check the number of pills, and the written instructions to make sure they are clear and accurate. Make sure that you are given enough medication to last until your next medication refill appointment. 5. Taking Medication: Take medication as prescribed. Never take more pills than instructed. Never take medication more frequently than prescribed. Taking less pills or less frequently is permitted and encouraged, when it comes to controlled substances (written prescriptions).  6. Inform other Doctors: Always inform, all of your healthcare providers, of all the medications you take. 7. Pain Medication from other Providers: You are not allowed to accept any additional pain medication from any other Doctor or Healthcare provider. There are two exceptions to this rule. (see below)  In the event that you require additional pain medication, you are responsible for notifying us, as stated below. 8. Medication Agreement: You are responsible for carefully reading and following our Medication Agreement. This must be signed before receiving any prescriptions from our practice. Safely store a copy of your signed Agreement. Violations to the Agreement will result in no further prescriptions. (Additional copies of our Medication Agreement are available upon request.) 9. Laws, Rules, & Regulations: All patients are expected to follow all Federal and [employer], [employer], Rules, [employer]. Ignorance of the Laws does not constitute a valid excuse. The use of any illegal substances is prohibited. 10. Adopted CDC guidelines & recommendations: Target dosing levels will be at or below 60 MME/day. Use of benzodiazepines** is not recommended.  Exceptions: There are only two exceptions to the rule of not receiving pain medications from other Healthcare Providers. 1. Exception #1 (Emergencies): In the event of an emergency (i.e.: accident requiring  emergency care), you are allowed to receive additional pain medication. However, you are responsible for: As soon as you are able, call our office [phone], at any time of the day or night, and leave a message stating your name, the date and nature of the emergency, and the name and dose of the medication prescribed. In the event that your call is answered by a member of our staff, make sure to document and save the date, time, and the name of the person that took your information.  2. Exception #2 (Planned Surgery): In the event that you are scheduled by another doctor or dentist to have any type of surgery or procedure, you are allowed (for a period no longer than 30 days), to receive additional pain medication, for the acute post-op pain. However, in this case, you are responsible for picking up a copy of our "Post-op Pain Management for Surgeons" handout, and giving it to your surgeon or dentist. This document is available at our office, and does not require an appointment to obtain it. Simply go to our office during business hours (Monday-Thursday from 8:00 AM to 4:00 PM) (Friday 8:00 AM to 12:00 Noon) or if you have a scheduled appointment with [place], prior to your surgery, and ask for it by name. In addition, you will need to provide [place] with your name, name of your surgeon, type of surgery, and date of procedure or surgery.  *Opioid medications include: morphine, codeine, oxycodone, oxymorphone, hydrocodone, hydromorphone, meperidine, tramadol, tapentadol, buprenorphine, fentanyl, methadone. **Benzodiazepine medications include: diazepam (Valium), alprazolam (Xanax), clonazepam (Klonopine), lorazepam (Ativan), clorazepate (Tranxene), chlordiazepoxide (Librium), estazolam (Prosom), oxazepam (Serax), temazepam (Restoril), triazolam (Halcion) (Last updated: 12/11/2017) ____________________________________________________________________________________________

## 2018-07-30 IMAGING — RF DG C-ARM 61-120 MIN-NO REPORT
1 series · 2 of 2 positions shown · non-contrast
Comparison: none

[Series 1: run · 2 of 2 slices shown]
[im 1/2]
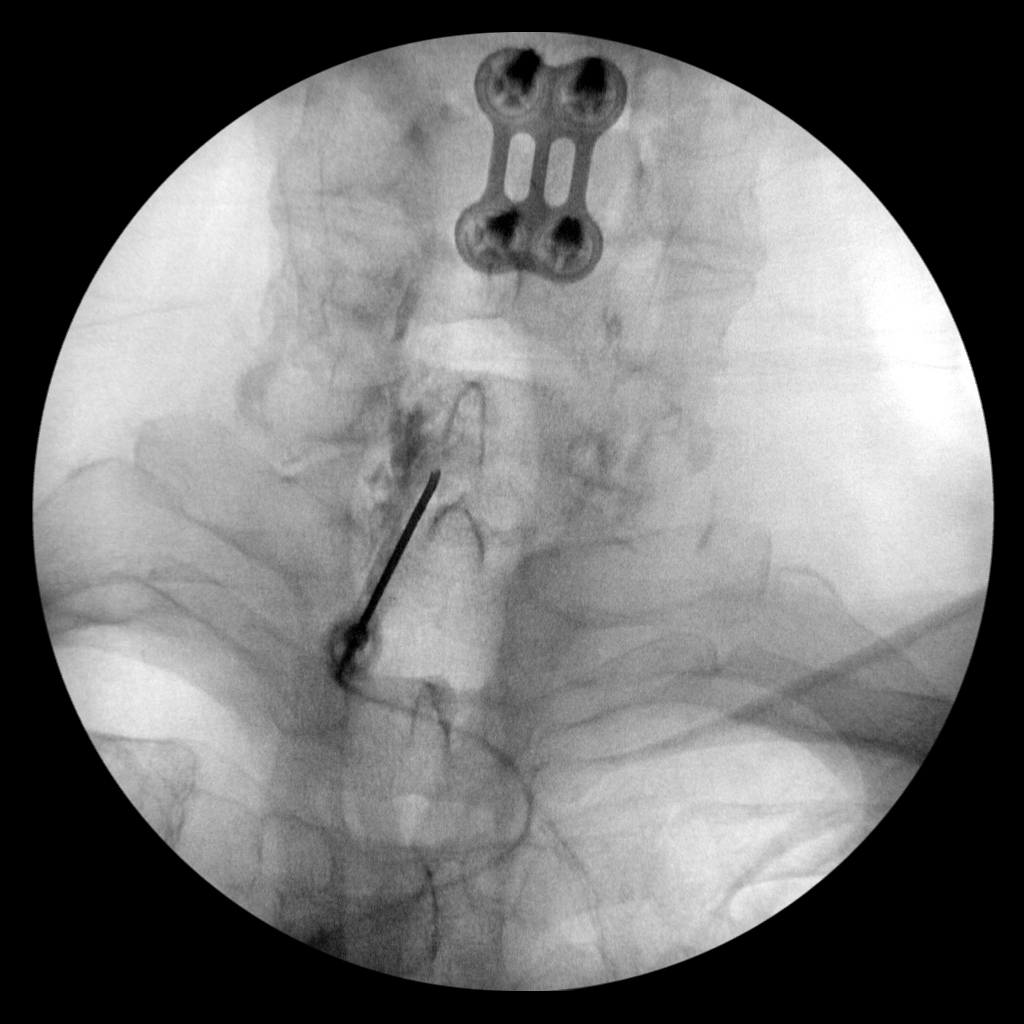
[im 2/2]
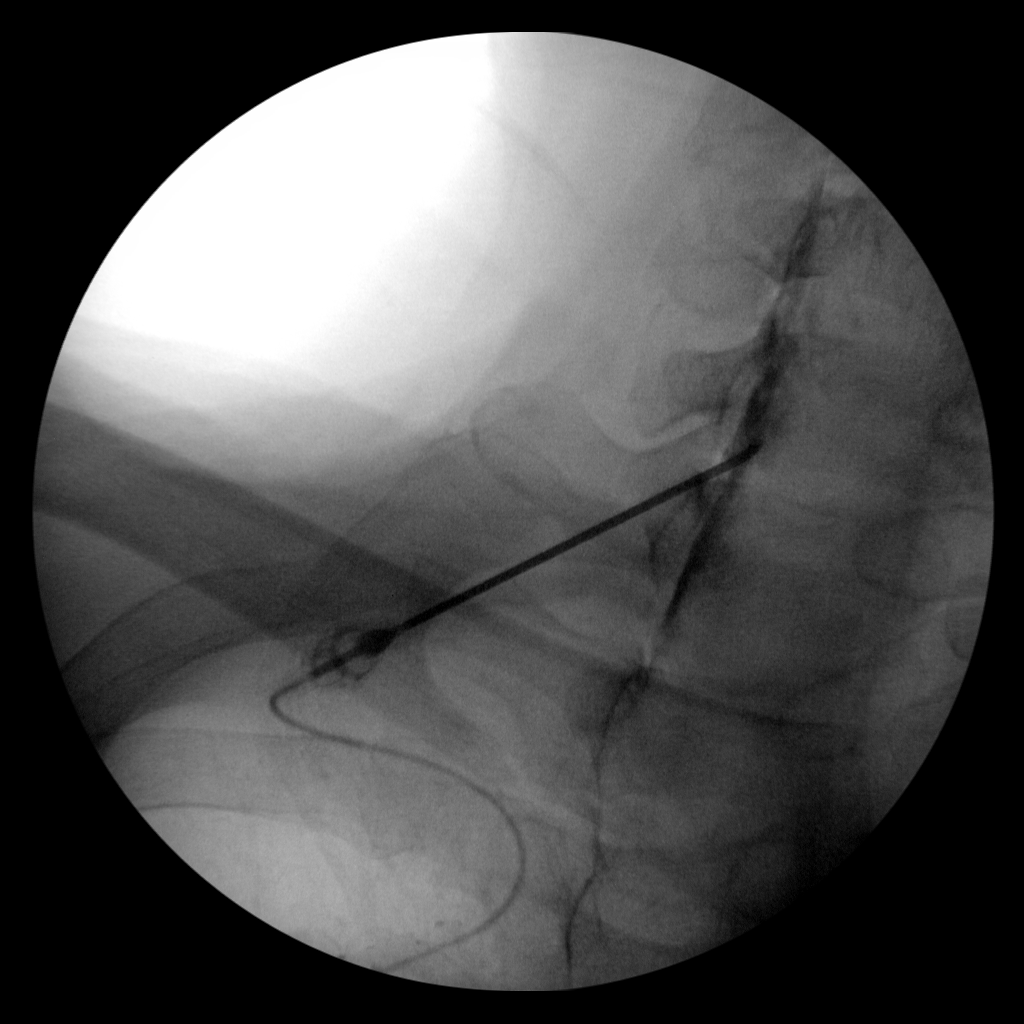

[2 of 2 positions shown; findings below may reference images not displayed]

:
Fluoroscopy was utilized by the requesting physician. No
radiographic interpretation.

## 2018-09-22 ENCOUNTER — Other Ambulatory Visit: Payer: Self-pay

## 2018-09-22 ENCOUNTER — Ambulatory Visit: Payer: Medicare Other | Attending: Nurse Practitioner | Admitting: Nurse Practitioner

## 2018-09-22 ENCOUNTER — Encounter: Payer: Self-pay | Admitting: Nurse Practitioner

## 2018-09-22 VITALS — BP 150/77 | HR 85 | Temp 98.1°F | Resp 16 | Ht 69.0 in | Wt 131.0 lb

## 2018-09-22 DIAGNOSIS — Z79899 Other long term (current) drug therapy: Secondary | ICD-10-CM | POA: Diagnosis not present

## 2018-09-22 DIAGNOSIS — F17210 Nicotine dependence, cigarettes, uncomplicated: Secondary | ICD-10-CM | POA: Diagnosis not present

## 2018-09-22 DIAGNOSIS — Z902 Acquired absence of lung [part of]: Secondary | ICD-10-CM | POA: Diagnosis not present

## 2018-09-22 DIAGNOSIS — M150 Primary generalized (osteo)arthritis: Secondary | ICD-10-CM

## 2018-09-22 DIAGNOSIS — G8929 Other chronic pain: Secondary | ICD-10-CM

## 2018-09-22 DIAGNOSIS — M79602 Pain in left arm: Secondary | ICD-10-CM | POA: Diagnosis not present

## 2018-09-22 DIAGNOSIS — F419 Anxiety disorder, unspecified: Secondary | ICD-10-CM | POA: Insufficient documentation

## 2018-09-22 DIAGNOSIS — M961 Postlaminectomy syndrome, not elsewhere classified: Secondary | ICD-10-CM

## 2018-09-22 DIAGNOSIS — J449 Chronic obstructive pulmonary disease, unspecified: Secondary | ICD-10-CM | POA: Diagnosis not present

## 2018-09-22 DIAGNOSIS — M797 Fibromyalgia: Secondary | ICD-10-CM | POA: Diagnosis not present

## 2018-09-22 DIAGNOSIS — M7918 Myalgia, other site: Secondary | ICD-10-CM

## 2018-09-22 DIAGNOSIS — M47816 Spondylosis without myelopathy or radiculopathy, lumbar region: Secondary | ICD-10-CM

## 2018-09-22 DIAGNOSIS — M5412 Radiculopathy, cervical region: Secondary | ICD-10-CM | POA: Diagnosis not present

## 2018-09-22 DIAGNOSIS — Z7951 Long term (current) use of inhaled steroids: Secondary | ICD-10-CM | POA: Insufficient documentation

## 2018-09-22 DIAGNOSIS — I4891 Unspecified atrial fibrillation: Secondary | ICD-10-CM | POA: Diagnosis not present

## 2018-09-22 DIAGNOSIS — M159 Polyosteoarthritis, unspecified: Secondary | ICD-10-CM

## 2018-09-22 DIAGNOSIS — E785 Hyperlipidemia, unspecified: Secondary | ICD-10-CM | POA: Insufficient documentation

## 2018-09-22 DIAGNOSIS — M5030 Other cervical disc degeneration, unspecified cervical region: Secondary | ICD-10-CM | POA: Diagnosis not present

## 2018-09-22 DIAGNOSIS — M1990 Unspecified osteoarthritis, unspecified site: Secondary | ICD-10-CM | POA: Insufficient documentation

## 2018-09-22 DIAGNOSIS — Z811 Family history of alcohol abuse and dependence: Secondary | ICD-10-CM | POA: Insufficient documentation

## 2018-09-22 DIAGNOSIS — T402X5A Adverse effect of other opioids, initial encounter: Secondary | ICD-10-CM | POA: Diagnosis not present

## 2018-09-22 DIAGNOSIS — M4722 Other spondylosis with radiculopathy, cervical region: Secondary | ICD-10-CM | POA: Insufficient documentation

## 2018-09-22 DIAGNOSIS — Z79891 Long term (current) use of opiate analgesic: Secondary | ICD-10-CM | POA: Diagnosis not present

## 2018-09-22 DIAGNOSIS — Z841 Family history of disorders of kidney and ureter: Secondary | ICD-10-CM | POA: Insufficient documentation

## 2018-09-22 DIAGNOSIS — Z888 Allergy status to other drugs, medicaments and biological substances status: Secondary | ICD-10-CM | POA: Insufficient documentation

## 2018-09-22 DIAGNOSIS — M4807 Spinal stenosis, lumbosacral region: Secondary | ICD-10-CM | POA: Diagnosis not present

## 2018-09-22 DIAGNOSIS — Z7982 Long term (current) use of aspirin: Secondary | ICD-10-CM | POA: Insufficient documentation

## 2018-09-22 DIAGNOSIS — F1021 Alcohol dependence, in remission: Secondary | ICD-10-CM | POA: Diagnosis not present

## 2018-09-22 DIAGNOSIS — H3530 Unspecified macular degeneration: Secondary | ICD-10-CM | POA: Insufficient documentation

## 2018-09-22 DIAGNOSIS — K5903 Drug induced constipation: Secondary | ICD-10-CM | POA: Diagnosis not present

## 2018-09-22 DIAGNOSIS — G894 Chronic pain syndrome: Secondary | ICD-10-CM | POA: Diagnosis not present

## 2018-09-22 DIAGNOSIS — F319 Bipolar disorder, unspecified: Secondary | ICD-10-CM | POA: Diagnosis not present

## 2018-09-22 DIAGNOSIS — I10 Essential (primary) hypertension: Secondary | ICD-10-CM | POA: Insufficient documentation

## 2018-09-22 DIAGNOSIS — M5010 Cervical disc disorder with radiculopathy, unspecified cervical region: Secondary | ICD-10-CM | POA: Diagnosis not present

## 2018-09-22 DIAGNOSIS — M546 Pain in thoracic spine: Secondary | ICD-10-CM | POA: Diagnosis present

## 2018-09-22 DIAGNOSIS — M47812 Spondylosis without myelopathy or radiculopathy, cervical region: Secondary | ICD-10-CM | POA: Diagnosis not present

## 2018-09-22 DIAGNOSIS — M792 Neuralgia and neuritis, unspecified: Secondary | ICD-10-CM

## 2018-09-22 DIAGNOSIS — M5414 Radiculopathy, thoracic region: Secondary | ICD-10-CM

## 2018-09-22 MED ORDER — PREGABALIN 150 MG PO CAPS: 150.0000 mg | ORAL_CAPSULE | Freq: Two times a day (BID) | ORAL | 0 refills | Status: DC

## 2018-09-22 MED ORDER — METHOCARBAMOL 750 MG PO TABS: 750.0000 mg | ORAL_TABLET | Freq: Three times a day (TID) | ORAL | 2 refills | Status: DC | PRN

## 2018-09-22 MED ORDER — OXYCODONE-ACETAMINOPHEN 5-325 MG PO TABS: 1.0000 | ORAL_TABLET | Freq: Four times a day (QID) | ORAL | 0 refills | Status: DC | PRN

## 2018-09-22 NOTE — Progress Notes (Signed)
Nursing Pain Medication Assessment:  Safety precautions to be maintained throughout the outpatient stay will include: orient to surroundings, keep bed in low position, maintain call bell within reach at all times, provide assistance with transfer out of bed and ambulation.  Medication Inspection Compliance: Pill count conducted under aseptic conditions, in front of the patient. Neither the pills nor the bottle was removed from the patient's sight at any time. Once count was completed pills were immediately returned to the patient in their original bottle.  Medication: Hydrocodone/APAP Pill/Patch Count: 44 of 120 pills remain Pill/Patch Appearance: Markings consistent with prescribed medication Bottle Appearance: Standard pharmacy container. Clearly labeled. Filled Date: 66 / 20 / 2019 Last Medication intake:  Today

## 2018-09-22 NOTE — Patient Instructions (Addendum)
____________________________________________________________________________________________  Medication Rules  Purpose: To inform patients, and their family members, of our rules and regulations.  Applies to: All patients receiving prescriptions (written or electronic).  Pharmacy of record: Pharmacy where electronic prescriptions will be sent. If written prescriptions are taken to a different pharmacy, please inform the nursing staff. The pharmacy listed in the electronic medical record should be the one where you would like electronic prescriptions to be sent.  Electronic prescriptions: In compliance with the [place] (STOP) Act of 2017 (Session Lanny Cramp [phone]), effective October 14, 2018, all controlled substances must be electronically prescribed. Calling prescriptions to the pharmacy will cease to exist.  Prescription refills: Only during scheduled appointments. Applies to all prescriptions.  NOTE: The following applies primarily to controlled substances (Opioid* Pain Medications).   Patient's responsibilities: 1. Pain Pills: Bring all pain pills to every appointment (except for procedure appointments). 2. Pill Bottles: Bring pills in original pharmacy bottle. Always bring the newest bottle. Bring bottle, even if empty. 3. Medication refills: You are responsible for knowing and keeping track of what medications you take and those you need refilled. The day before your appointment: write a list of all prescriptions that need to be refilled. The day of the appointment: give the list to the admitting nurse. Prescriptions will be written only during appointments. If you forget a medication: it will not be "Called in", "Faxed", or "electronically sent". You will need to get another appointment to get these prescribed. No early refills. Do not call asking to have your prescription filled early. 4. Prescription Accuracy: You are responsible for  carefully inspecting your prescriptions before leaving our office. Have the discharge nurse carefully go over each prescription with you, before taking them home. Make sure that your name is accurately spelled, that your address is correct. Check the name and dose of your medication to make sure it is accurate. Check the number of pills, and the written instructions to make sure they are clear and accurate. Make sure that you are given enough medication to last until your next medication refill appointment. 5. Taking Medication: Take medication as prescribed. When it comes to controlled substances, taking less pills or less frequently than prescribed is permitted and encouraged. Never take more pills than instructed. Never take medication more frequently than prescribed.  6. Inform other Doctors: Always inform, all of your healthcare providers, of all the medications you take. 7. Pain Medication from other Providers: You are not allowed to accept any additional pain medication from any other Doctor or Healthcare provider. There are two exceptions to this rule. (see below) In the event that you require additional pain medication, you are responsible for notifying us, as stated below. 8. Medication Agreement: You are responsible for carefully reading and following our Medication Agreement. This must be signed before receiving any prescriptions from our practice. Safely store a copy of your signed Agreement. Violations to the Agreement will result in no further prescriptions. (Additional copies of our Medication Agreement are available upon request.) 9. Laws, Rules, & Regulations: All patients are expected to follow all Federal and [employer], [employer], Rules, [employer]. Ignorance of the Laws does not constitute a valid excuse. The use of any illegal substances is prohibited. 10. Adopted CDC guidelines & recommendations: Target dosing levels will be at or below 60 MME/day. Use of benzodiazepines** is not  recommended.  Exceptions: There are only two exceptions to the rule of not receiving pain medications from other Healthcare Providers. 1.  Exception #1 (Emergencies): In the event of an emergency (i.e.: accident requiring emergency care), you are allowed to receive additional pain medication. However, you are responsible for: As soon as you are able, call our office [phone], at any time of the day or night, and leave a message stating your name, the date and nature of the emergency, and the name and dose of the medication prescribed. In the event that your call is answered by a member of our staff, make sure to document and save the date, time, and the name of the person that took your information.  2. Exception #2 (Planned Surgery): In the event that you are scheduled by another doctor or dentist to have any type of surgery or procedure, you are allowed (for a period no longer than 30 days), to receive additional pain medication, for the acute post-op pain. However, in this case, you are responsible for picking up a copy of our "Post-op Pain Management for Surgeons" handout, and giving it to your surgeon or dentist. This document is available at our office, and does not require an appointment to obtain it. Simply go to our office during business hours (Monday-Thursday from 8:00 AM to 4:00 PM) (Friday 8:00 AM to 12:00 Noon) or if you have a scheduled appointment with [place], prior to your surgery, and ask for it by name. In addition, you will need to provide [place] with your name, name of your surgeon, type of surgery, and date of procedure or surgery.  *Opioid medications include: morphine, codeine, oxycodone, oxymorphone, hydrocodone, hydromorphone, meperidine, tramadol, tapentadol, buprenorphine, fentanyl, methadone. **Benzodiazepine medications include: diazepam (Valium), alprazolam (Xanax), clonazepam (Klonopine), lorazepam (Ativan), clorazepate (Tranxene), chlordiazepoxide (Librium), estazolam (Prosom),  oxazepam (Serax), temazepam (Restoril), triazolam (Halcion) (Last updated: 12/11/2017) ____________________________________________________________________________________________   [facility] you had a great birthday!!____________________________________________________________________________________________  Preparing for Procedure with Sedation  Instructions: . Oral Intake: Do not eat or drink anything for at least 8 hours prior to your procedure. . Transportation: Public transportation is not allowed. Bring an adult driver. The driver must be physically present in our waiting room before any procedure can be started. Marland Kitchen Physical Assistance: Bring an adult physically capable of assisting you, in the event you need help. This adult should keep you company at home for at least 6 hours after the procedure. . Blood Pressure Medicine: Take your blood pressure medicine with a sip of water the morning of the procedure. . Blood thinners: Notify our staff if you are taking any blood thinners. Depending on which one you take, there will be specific instructions on how and when to stop it. . Diabetics on insulin: Notify the staff so that you can be scheduled 1st case in the morning. If your diabetes requires high dose insulin, take only  of your normal insulin dose the morning of the procedure and notify the staff that you have done so. . Preventing infections: Shower with an antibacterial soap the morning of your procedure. . Build-up your immune system: Take 1000 mg of Vitamin C with every meal (3 times a day) the day prior to your procedure. Marland Kitchen Antibiotics: Inform the staff if you have a condition or reason that requires you to take antibiotics before dental procedures. . Pregnancy: If you are pregnant, call and cancel the procedure. . Sickness: If you have a cold, fever, or any active infections, call and cancel the procedure. . Arrival: You must be in the facility at least 30 minutes prior to your  scheduled procedure. . Children: Do not  bring children with you. . Dress appropriately: Bring dark clothing that you would not mind if they get stained. . Valuables: Do not bring any jewelry or valuables.  Procedure appointments are reserved for interventional treatments only. Marland Kitchen No Prescription Refills. . No medication changes will be discussed during procedure appointments. . No disability issues will be discussed.  Reasons to call and reschedule or cancel your procedure: (Following these recommendations will minimize the risk of a serious complication.) . Surgeries: Avoid having procedures within 2 weeks of any surgery. (Avoid for 2 weeks before or after any surgery). . Flu Shots: Avoid having procedures within 2 weeks of a flu shots or . (Avoid for 2 weeks before or after immunizations). . Barium: Avoid having a procedure within 7-10 days after having had a radiological study involving the use of radiological contrast. (Myelograms, Barium swallow or enema study). . Heart attacks: Avoid any elective procedures or surgeries for the initial 6 months after a "Myocardial Infarction" (Heart Attack). . Blood thinners: It is imperative that you stop these medications before procedures. Let us know if you if you take any blood thinner.  . Infection: Avoid procedures during or within two weeks of an infection (including chest colds or gastrointestinal problems). Symptoms associated with infections include: Localized redness, fever, chills, night sweats or profuse sweating, burning sensation when voiding, cough, congestion, stuffiness, runny nose, sore throat, diarrhea, nausea, vomiting, cold or Flu symptoms, recent or current infections. It is specially important if the infection is over the area that we intend to treat. Marland Kitchen Heart and lung problems: Symptoms that may suggest an active cardiopulmonary problem include: cough, chest pain, breathing difficulties or shortness of breath, dizziness, ankle swelling,  uncontrolled high or unusually low blood pressure, and/or palpitations. If you are experiencing any of these symptoms, cancel your procedure and contact your primary care physician for an evaluation.  Remember:  Regular Business hours are:  Monday to Thursday 8:00 AM to 4:00 PM  Provider's Schedule: Milinda Pointer, MD:  Procedure days: Tuesday and Thursday 7:30 AM to 4:00 PM  Gillis Santa, MD:  Procedure days: Monday and Wednesday 7:30 AM to 4:00 PM ____________________________________________________________________________________________  ____________________________________________________________________________________________  Preparing for your procedure (without sedation)  Instructions: . Oral Intake: Do not eat or drink anything for at least 3 hours prior to your procedure. . Transportation: Unless otherwise stated by your physician, you may drive yourself after the procedure. . Blood Pressure Medicine: Take your blood pressure medicine with a sip of water the morning of the procedure. . Blood thinners: Notify our staff if you are taking any blood thinners. Depending on which one you take, there will be specific instructions on how and when to stop it. . Diabetics on insulin: Notify the staff so that you can be scheduled 1st case in the morning. If your diabetes requires high dose insulin, take only  of your normal insulin dose the morning of the procedure and notify the staff that you have done so. . Preventing infections: Shower with an antibacterial soap the morning of your procedure.  . Build-up your immune system: Take 1000 mg of Vitamin C with every meal (3 times a day) the day prior to your procedure. Marland Kitchen Antibiotics: Inform the staff if you have a condition or reason that requires you to take antibiotics before dental procedures. . Pregnancy: If you are pregnant, call and cancel the procedure. . Sickness: If you have a cold, fever, or any active infections, call and  cancel the procedure. . Arrival:  You must be in the facility at least 30 minutes prior to your scheduled procedure. . Children: Do not bring any children with you. . Dress appropriately: Bring dark clothing that you would not mind if they get stained. . Valuables: Do not bring any jewelry or valuables.  Procedure appointments are reserved for interventional treatments only. Marland Kitchen No Prescription Refills. . No medication changes will be discussed during procedure appointments. . No disability issues will be discussed.  Reasons to call and reschedule or cancel your procedure: (Following these recommendations will minimize the risk of a serious complication.) . Surgeries: Avoid having procedures within 2 weeks of any surgery. (Avoid for 2 weeks before or after any surgery). . Flu Shots: Avoid having procedures within 2 weeks of a flu shots or . (Avoid for 2 weeks before or after immunizations). . Barium: Avoid having a procedure within 7-10 days after having had a radiological study involving the use of radiological contrast. (Myelograms, Barium swallow or enema study). . Heart attacks: Avoid any elective procedures or surgeries for the initial 6 months after a "Myocardial Infarction" (Heart Attack). . Blood thinners: It is imperative that you stop these medications before procedures. Let us know if you if you take any blood thinner.  . Infection: Avoid procedures during or within two weeks of an infection (including chest colds or gastrointestinal problems). Symptoms associated with infections include: Localized redness, fever, chills, night sweats or profuse sweating, burning sensation when voiding, cough, congestion, stuffiness, runny nose, sore throat, diarrhea, nausea, vomiting, cold or Flu symptoms, recent or current infections. It is specially important if the infection is over the area that we intend to treat. Marland Kitchen Heart and lung problems: Symptoms that may suggest an active cardiopulmonary problem  include: cough, chest pain, breathing difficulties or shortness of breath, dizziness, ankle swelling, uncontrolled high or unusually low blood pressure, and/or palpitations. If you are experiencing any of these symptoms, cancel your procedure and contact your primary care physician for an evaluation.  Remember:  Regular Business hours are:  Monday to Thursday 8:00 AM to 4:00 PM  Provider's Schedule: Milinda Pointer, MD:  Procedure days: Tuesday and Thursday 7:30 AM to 4:00 PM  Gillis Santa, MD:  Procedure days: Monday and Wednesday 7:30 AM to 4:00 PM ____________________________________________________________________________________________   Epidural Steroid Injection An epidural steroid injection is a shot of steroid medicine and numbing medicine that is given into the space between the spinal cord and the bones in your back (epidural space). The shot helps relieve pain caused by an irritated or swollen nerve root. The amount of pain relief you get from the injection depends on what is causing the nerve to be swollen and irritated, and how long your pain lasts. You are more likely to benefit from this injection if your pain is strong and comes on suddenly rather than if you have had pain for a long time. Tell a health care provider about:  Any allergies you have.  All medicines you are taking, including vitamins, herbs, eye drops, creams, and over-the-counter medicines.  Any problems you or family members have had with anesthetic medicines.  Any blood disorders you have.  Any surgeries you have had.  Any medical conditions you have.  Whether you are pregnant or may be pregnant. What are the risks? Generally, this is a safe procedure. However, problems may occur, including:  Headache.  Bleeding.  Infection.  Allergic reaction to medicines.  Damage to your nerves.  What happens before the procedure? Staying hydrated Follow  instructions from your health care provider  about hydration, which may include:  Up to 2 hours before the procedure - you may continue to drink clear liquids, such as water, clear fruit juice, black coffee, and plain tea.  Eating and drinking restrictions Follow instructions from your health care provider about eating and drinking, which may include:  8 hours before the procedure - stop eating heavy meals or foods such as meat, fried foods, or fatty foods.  6 hours before the procedure - stop eating light meals or foods, such as toast or cereal.  6 hours before the procedure - stop drinking milk or drinks that contain milk.  2 hours before the procedure - stop drinking clear liquids.  Medicine  You may be given medicines to lower anxiety.  Ask your health care provider about: ? Changing or stopping your regular medicines. This is especially important if you are taking diabetes medicines or blood thinners. ? Taking medicines such as aspirin and ibuprofen. These medicines can thin your blood. Do not take these medicines before your procedure if your health care provider instructs you not to. General instructions  Plan to have someone take you home from the hospital or clinic. What happens during the procedure?  You may receive a medicine to help you relax (sedative).  You will be asked to lie on your abdomen.  The injection site will be cleaned.  A numbing medicine (local anesthetic) will be used to numb the injection site.  A needle will be inserted through your skin into the epidural space. You may feel some discomfort when this happens. An X-ray machine will be used to make sure the needle is put as close as possible to the affected nerve.  A steroid medicine and a local anesthetic will be injected into the epidural space.  The needle will be removed.  A bandage (dressing) will be put over the injection site. What happens after the procedure?  Your blood pressure, heart rate, breathing rate, and blood oxygen level  will be monitored until the medicines you were given have worn off.  Your arm or leg may feel weak or numb for a few hours.  The injection site may feel sore.  Do not drive for 24 hours if you received a sedative. This information is not intended to replace advice given to you by your health care provider. Make sure you discuss any questions you have with your health care provider. Document Released: 01/07/2008 Document Revised: 03/13/2016 Document Reviewed: 01/16/2016 Elsevier Interactive Patient Education  [employer].

## 2018-09-22 NOTE — Progress Notes (Signed)
Patient's Name: Rick MOELLER Sr.  MRN: [id number]  Referring Provider: [place]  DOB: Nov 08, 1950  PCP: [place]  DOS: 09/22/2018  Note by: Vevelyn Francois NP  Service setting: Ambulatory outpatient  Specialty: Interventional Pain Management  Location: [facility] (AMB) Pain Management Facility    Patient type: Established    Primary Reason(s) for Visit: Encounter for prescription drug management. (Level of risk: moderate)  CC: Back Pain (upper mid back) and Neck Pain  HPI  [name] is a 67 y.o. year old, male patient, who comes today for a medication management evaluation. 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[place]); Atrial fibrillation ([place]); Bipolar affective disorder ([place]); COPD exacerbation ([place]); DDD (degenerative disc disease), cervical; Colon, diverticulosis; Acid reflux; Essential (primary) hypertension; HLD (hyperlipidemia); Breath shortness; Compulsive tobacco user syndrome; Diverticular disease of large intestine; Post-thoracotomy pain syndrome; Pharmacologic therapy; Long term current use of opiate analgesic; Long term prescription opiate use; Uncomplicated opioid dependence ([place]); Opiate use (30 MME/Day); Fibromyalgia; Chronic pain syndrome; Lumbar facet syndrome (Bilateral) (R>L); History of alcoholism ([place]); Chronic low back pain (Primary Area of Pain) (midline); Lumbar spondylosis; Cervical spondylosis; Failed cervical surgery syndrome; Nicotine dependence; Tobacco abuse; Substance Use Disorder Risk: High; History of right-sided partial pneumonectomy; Atherosclerotic peripheral vascular disease ([place]); Musculoskeletal pain; Neurogenic pain; Neuropathic pain; Opioid-induced constipation (OIC); Osteoarthritis; NSAID induced gastritis; Thoracic spine pain; Thoracic radicular pain (Right); Chronic lumbar radicular pain (L5/S1 dermatome) (Right); Lumbar foraminal stenosis (L5-S1) (Right); Lumbar facet arthropathy (Bilateral) (R>L); Lumbar disc protrusion (L3-4)  (Right); Chronic cervical radicular pain (Left); Tumor of parotid gland; Trigger finger (middle finger) (Left); Disturbance of skin sensation; Pain of lumbar spine (L2-3 interspinous ligament); Chronic lower extremity pain (Secondary Area of Pain) (Right); DDD (degenerative disc disease), lumbar; Failed back surgical syndrome; Chronic upper extremity pain (Left); Pleomorphic adenoma of parotid gland; Disorder of skeletal system; Problems influencing health status; and DDD (degenerative disc disease), thoracic on their problem list. His primarily concern today is the Back Pain (upper mid back) and Neck Pain  Pain Assessment: Location: Upper, Mid, Lower Back Radiating: entire back mneck radiates left arm to elbow Onset: More than a month ago Duration: Chronic pain Quality: Aching, Constant, Discomfort Severity: 5 /10 (subjective, self-reported pain score)  Note: Reported level is compatible with observation.                         When using our objective Pain Scale, levels between 6 and 10/10 are said to belong in an emergency room, as it progressively worsens from a 6/10, described as severely limiting, requiring emergency care not usually available at an outpatient pain management facility. At a 6/10 level, communication becomes difficult and requires great effort. Assistance to reach the emergency department may be required. Facial flushing and profuse sweating along with potentially dangerous increases in heart rate and blood pressure will be evident. Effect on ADL: limit activities, walking, sitting Timing: Constant Modifying factors: medication, rest BP: (!) 150/77  HR: 85  [name] was last scheduled for an appointment on 07/07/2018 for medication management. During today's appointment we reviewed [name] chronic pain status, as well as his outpatient medication regimen.  The patient  has no drug history on file. His body mass index is 19.35 kg/m.  Further details on both, my  assessment(s), as well as the proposed treatment plan, please see below.  Controlled Substance Pharmacotherapy Assessment REMS (Risk Evaluation and Mitigation Strategy)  Analgesic:Oxycodone/APAP 5/325 one every  6 hours (20 mg/day) MME/day:30 mg/day   Rick Specking, RN  09/22/2018 11:11 AM  Sign at close encounter Nursing Pain Medication Assessment:  Safety precautions to be maintained throughout the outpatient stay will include: orient to surroundings, keep bed in low position, maintain call bell within reach at all times, provide assistance with transfer out of bed and ambulation.  Medication Inspection Compliance: Pill count conducted under aseptic conditions, in front of the patient. Neither the pills nor the bottle was removed from the patient's sight at any time. Once count was completed pills were immediately returned to the patient in their original bottle.  Medication: Hydrocodone/APAP Pill/Patch Count: 44 of 120 pills remain Pill/Patch Appearance: Markings consistent with prescribed medication Bottle Appearance: Standard pharmacy container. Clearly labeled. Filled Date: 109 / 20 / 2019 Last Medication intake:  Today   Pharmacokinetics: Liberation and absorption (onset of action): WNL Distribution (time to peak effect): WNL Metabolism and excretion (duration of action): WNL         Pharmacodynamics: Desired effects: Analgesia: [name] reports >50% benefit. Functional ability: Patient reports that medication allows him to accomplish basic ADLs Clinically meaningful improvement in function (CMIF): Sustained CMIF goals met Perceived effectiveness: Described as relatively effective, allowing for increase in activities of daily living (ADL) Undesirable effects: Side-effects or Adverse reactions: None reported Monitoring: [place] PMP: Online review of the past 8-monthperiod conducted. Compliant with practice rules and regulations Last UDS on record: Summary  Date Value Ref  Range Status  03/26/2018 FINAL  Final    Comment:    ==================================================================== TOXASSURE SELECT 13 (MW) ==================================================================== Test                             Result       Flag       Units Drug Present and Declared for Prescription Verification   Oxycodone                      740          EXPECTED   ng/mg creat   Oxymorphone                    2200         EXPECTED   ng/mg creat   Noroxycodone                   1517         EXPECTED   ng/mg creat   Noroxymorphone                 623          EXPECTED   ng/mg creat    Sources of oxycodone are scheduled prescription medications.    Oxymorphone, noroxycodone, and noroxymorphone are expected    metabolites of oxycodone. Oxymorphone is also available as a    scheduled prescription medication. ==================================================================== Test                      Result    Flag   Units      Ref Range   Creatinine              35               mg/dL      >=20 ==================================================================== Declared Medications:  The flagging and interpretation on this report are based on the  following  declared medications.  Unexpected results may arise from  inaccuracies in the declared medications.  **Note: The testing scope of this panel includes these medications:  Oxycodone (Roxicet)  **Note: The testing scope of this panel does not include following  reported medications:  Acetaminophen (Roxicet)  Aclidinium (Tudorza Pressair)  Albuterol  Albuterol (Duoneb)  Aspirin (Aspirin 81)  Calcium carbonate (Calcium carbonate/Vitamin D3)  Cholecalciferol  Diphenhydramine (Benadryl)  Docusate (Colace)  Fluticasone (Advair)  Hydrochlorothiazide (Microzide)  Hydroxyzine (Atarax)  Ibuprofen (Advil)  Ipratropium (Duoneb)  Loratadine  Metformin  Methocarbamol  Multivitamin  Pantoprazole (Protonix)   Pregabalin (Lyrica)  Salmeterol (Advair)  Sildenafil (Viagra)  Trazodone  Vitamin D3 (Calcium carbonate/Vitamin D3) ==================================================================== For clinical consultation, please call [phone]. ====================================================================    UDS interpretation: Compliant          Medication Assessment Form: Reviewed. Patient indicates being compliant with therapy Treatment compliance: Compliant Risk Assessment Profile: Aberrant behavior: See prior evaluations. None observed or detected today Comorbid factors increasing risk of overdose: See prior notes. No additional risks detected today Opioid risk tool (ORT) (Total Score): 7 Personal History of Substance Abuse (SUD-Substance use disorder):  Alcohol: Positive Male or Male  Illegal Drugs: Negative  Rx Drugs: Negative  ORT Risk Level calculation: Moderate Risk Risk of substance use disorder (SUD): Moderate Opioid Risk Tool - 09/22/18 1107      Family History of Substance Abuse   Alcohol  Positive Male    Illegal Drugs  Negative    Rx Drugs  Negative      Personal History of Substance Abuse   Alcohol  Positive Male or Male    Illegal Drugs  Negative    Rx Drugs  Negative      Age   Age between 60-45 years   No      Psychological Disease   Psychological Disease  Negative    Depression  Positive      Total Score   Opioid Risk Tool Scoring  7    Opioid Risk Interpretation  Moderate Risk      ORT Scoring interpretation table:  Score <3 = Low Risk for SUD  Score between 4-7 = Moderate Risk for SUD  Score >8 = High Risk for Opioid Abuse   Risk Mitigation Strategies:  Patient Counseling: Covered Patient-Prescriber Agreement (PPA): Present and active  Notification to other healthcare providers: Done  Pharmacologic Plan: No change in therapy, at this time.             Laboratory Chemistry  Inflammation Markers (CRP: Acute Phase) (ESR: Chronic  Phase) Lab Results  Component Value Date   CRP 2 04/06/2018   ESRSEDRATE 4 04/06/2018                         Rheumatology Markers No results found for: RF, ANA, LABURIC, URICUR, LYMEIGGIGMAB, LYMEABIGMQN, HLAB27                      Renal Function Markers Lab Results  Component Value Date   BUN 9 04/06/2018   CREATININE 0.84 04/06/2018   BCR 11 04/06/2018   GFRAA 104 04/06/2018   GFRNONAA 90 04/06/2018                             Hepatic Function Markers Lab Results  Component Value Date   AST 18 04/06/2018   ALT 24 01/06/2017   ALBUMIN 4.6  04/06/2018   ALKPHOS 64 04/06/2018                        Electrolytes Lab Results  Component Value Date   NA 144 04/06/2018   K 4.2 04/06/2018   CL 102 04/06/2018   CALCIUM 9.8 04/06/2018   MG 2.1 04/06/2018                        Neuropathy Markers Lab Results  Component Value Date   BXUXYBFX83 291 04/06/2018                        CNS Tests No results found for: COLORCSF, APPEARCSF, RBCCOUNTCSF, WBCCSF, POLYSCSF, LYMPHSCSF, EOSCSF, PROTEINCSF, GLUCCSF, JCVIRUS, CSFOLI, IGGCSF                      Bone Pathology Markers Lab Results  Component Value Date   25OHVITD1 76 04/06/2018   25OHVITD2 <1.0 04/06/2018   25OHVITD3 76 04/06/2018                         Coagulation Parameters No results found for: INR, LABPROT, APTT, PLT, DDIMER, LABHEMA, VITAMINK1                      Cardiovascular Markers No results found for: BNP, CKTOTAL, CKMB, TROPONINI, HGB, HCT                       CA Markers No results found for: CEA, CA125, LABCA2                      Note: Lab results reviewed.  Recent Diagnostic Imaging Results  DG C-Arm 1-60 Min-No Report Fluoroscopy was utilized by the requesting physician.  No radiographic  interpretation.   Complexity Note: Imaging results reviewed. Results shared with Mr. Kirby, using Layman's terms.                         Meds   Current Outpatient Medications:  .   acetylcysteine (MUCOMYST) 10 % nebulizer solution, Take 4 mLs by nebulization., Disp: , Rfl:  .  Aclidinium Bromide (TUDORZA PRESSAIR IN), Inhale 40 mcg into the lungs 2 (two) times daily., Disp: , Rfl:  .  ADVAIR DISKUS 250-50 MCG/DOSE AEPB, INHALE 1 PUFF 2 (TWO) TIMES A DAY., Disp: , Rfl: 11 .  albuterol (PROVENTIL) (2.5 MG/3ML) 0.083% nebulizer solution, INHALE 0.5 ML (2.5 MG TOTAL) BY NEBULIZATION EVERY SIX (6) HOURS AS NEEDED FOR WHEEZING., Disp: , Rfl: 11 .  aspirin EC 81 MG tablet, Take 81 mg by mouth daily., Disp: , Rfl:  .  Calcium Carb-Cholecalciferol 600-800 MG-UNIT TABS, TAKE 1 TABLET BY MOUTH 2 TIMES A DAY, Disp: , Rfl:  .  cholecalciferol (VITAMIN D) 1000 UNITS tablet, Take 2,000 Units by mouth 2 (two) times daily. , Disp: , Rfl:  .  clonazePAM (KLONOPIN) 0.5 MG tablet, , Disp: , Rfl:  .  diphenhydrAMINE (BENADRYL) 25 mg capsule, Take 25 mg by mouth 2 (two) times daily. , Disp: , Rfl:  .  escitalopram (LEXAPRO) 10 MG tablet, , Disp: , Rfl:  .  Fluticasone-Salmeterol (ADVAIR) 500-50 MCG/DOSE AEPB, Inhale 1 puff into the lungs 2 (two) times daily., Disp: , Rfl:  .  guaiFENesin (MUCINEX) 600 MG 12 hr tablet, Take  by mouth 2 (two) times daily., Disp: , Rfl:  .  hydrochlorothiazide (MICROZIDE) 12.5 MG capsule, TAKE ONE CAPSULE BY MOUTH FOR HIGH BLOOD PRESSURE, Disp: , Rfl: 3 .  ipratropium-albuterol (DUONEB) 0.5-2.5 (3) MG/3ML SOLN, Take 3 mLs by nebulization every 6 (six) hours as needed., Disp: , Rfl:  .  loratadine (CLARITIN) 10 MG tablet, Take 10 mg by mouth. , Disp: , Rfl:  .  Multiple Vitamins-Minerals (PRESERVISION AREDS 2 PO), Take 1 tablet by mouth 2 (two) times daily., Disp: , Rfl:  .  pantoprazole (PROTONIX) 40 MG tablet, Take 40 mg by mouth., Disp: , Rfl:  .  sildenafil (VIAGRA) 100 MG tablet, 100 mg., Disp: , Rfl:  .  sodium chloride HYPERTONIC 3 % nebulizer solution, INHALE 4 ML BY NEBULIZATION TWICE A DAY, Disp: , Rfl: 6 .  traZODone (DESYREL) 50 MG tablet, Take 50 mg by  mouth at bedtime. Reported on 02/20/2016, Disp: , Rfl:  .  TUDORZA PRESSAIR 400 MCG/ACT AEPB, Inhale 1 puff into the lungs 2 (two) times daily., Disp: , Rfl: 11 .  VENTOLIN HFA 108 (90 Base) MCG/ACT inhaler, INHALE 2 PUFFS EVERY FOUR (4) HOURS AS NEEDED FOR WHEEZING, Disp: , Rfl: 11 .  docusate sodium (COLACE) 100 MG capsule, Take 1 capsule (100 mg total) by mouth daily., Disp: 90 capsule, Rfl: 0 .  ibuprofen (ADVIL,MOTRIN) 800 MG tablet, Take 1 tablet (800 mg total) by mouth every 8 (eight) hours as needed for moderate pain., Disp: 270 tablet, Rfl: 0 .  [START ON 09/30/2018] methocarbamol (ROBAXIN) 750 MG tablet, Take 1 tablet (750 mg total) by mouth every 8 (eight) hours as needed for muscle spasms., Disp: 90 tablet, Rfl: 2 .  [START ON 11/29/2018] oxyCODONE-acetaminophen (PERCOCET/ROXICET) 5-325 MG tablet, Take 1 tablet by mouth every 6 (six) hours as needed for severe pain., Disp: 120 tablet, Rfl: 0 .  [START ON 10/30/2018] oxyCODONE-acetaminophen (PERCOCET/ROXICET) 5-325 MG tablet, Take 1 tablet by mouth every 6 (six) hours as needed for severe pain., Disp: 120 tablet, Rfl: 0 .  [START ON 09/30/2018] oxyCODONE-acetaminophen (PERCOCET/ROXICET) 5-325 MG tablet, Take 1 tablet by mouth every 6 (six) hours as needed for severe pain., Disp: 120 tablet, Rfl: 0 .  [START ON 09/30/2018] pregabalin (LYRICA) 150 MG capsule, Take 1 capsule (150 mg total) by mouth 2 (two) times daily., Disp: 180 capsule, Rfl: 0  ROS  Constitutional: Denies any fever or chills Gastrointestinal: No reported hemesis, hematochezia, vomiting, or acute GI distress Musculoskeletal: Denies any acute onset joint swelling, redness, loss of ROM, or weakness Neurological: No reported episodes of acute onset apraxia, aphasia, dysarthria, agnosia, amnesia, paralysis, loss of coordination, or loss of consciousness  Allergies  Mr. Webb is allergic to atorvastatin and varenicline.  PFSH  Drug: Mr. Kaminsky  has no drug history on  file. Alcohol:  reports that he does not drink alcohol. Tobacco:  reports that he has been smoking cigarettes. He has a 51.00 pack-year smoking history. He has never used smokeless tobacco. Medical:  has a past medical history of Anxiety, Bronchitis, COPD (chronic obstructive pulmonary disease) ([place]), Depression, Hernia of abdominal wall, History of alcoholism ([place]) (08/08/2015), History of pneumonectomy (08/08/2015), Hypertension, Macular degeneration (08/26/2016), and Tumor cells. Surgical: Mr. Lenzen  has a past surgical history that includes Tonsillectomy; Lung removal, partial; and Neck surgery. Family: family history includes Alcohol abuse in his father; Kidney disease in his mother.  Constitutional Exam  General appearance: Well nourished, well developed, and well hydrated. In no apparent acute distress  Vitals:   09/22/18 1100  BP: (!) 150/77  Pulse: 85  Resp: 16  Temp: 98.1 F (36.7 C)  SpO2: 93%  Weight: 131 lb (59.4 kg)  Height: _0  (1.753 m)  Psych/Mental status: Alert, oriented x 3 (person, place, & time)       Eyes: PERLA Respiratory: No evidence of acute respiratory distress  Cervical Spine Area Exam  Skin & Axial Inspection: No masses, redness, edema, swelling, or associated skin lesions Alignment: Symmetrical Functional ROM: Unrestricted ROM      Stability: No instability detected Muscle Tone/Strength: Functionally intact. No obvious neuro-muscular anomalies detected. Sensory (Neurological): Paresthesia (Burning sensation) Palpation: Complains of area being tender to palpation              Upper Extremity (UE) Exam    Side: Right upper extremity  Side: Left upper extremity  Skin & Extremity Inspection: Skin color, temperature, and hair growth are WNL. No peripheral edema or cyanosis. No masses, redness, swelling, asymmetry, or associated skin lesions. No contractures.  Skin & Extremity Inspection: Skin color, temperature, and hair growth are WNL. No peripheral  edema or cyanosis. No masses, redness, swelling, asymmetry, or associated skin lesions. No contractures.  Functional ROM: Unrestricted ROM          Functional ROM: Unrestricted ROM          Muscle Tone/Strength: Functionally intact. No obvious neuro-muscular anomalies detected.  Muscle Tone/Strength: Functionally intact. No obvious neuro-muscular anomalies detected.  Sensory (Neurological): Unimpaired          Sensory (Neurological): Paresthesia (Burning sensation)          Palpation: No palpable anomalies              Palpation: No palpable anomalies               Thoracic Spine Area Exam  Skin & Axial Inspection: No masses, redness, or swelling Alignment: Symmetrical Functional ROM: Unrestricted ROM Stability: No instability detected Muscle Tone/Strength: Functionally intact. No obvious neuro-muscular anomalies detected. Sensory (Neurological): Unimpaired Muscle strength & Tone: No palpable anomalies  Lumbar Spine Area Exam  Skin & Axial Inspection: No masses, redness, or swelling Alignment: Symmetrical Functional ROM: Unrestricted ROM       Stability: No instability detected Muscle Tone/Strength: Functionally intact. No obvious neuro-muscular anomalies detected. Sensory (Neurological): Unimpaired Palpation: No palpable anomalies        Gait & Posture Assessment  Ambulation: Unassisted Gait: Relatively normal for age and body habitus Posture: WNL   Lower Extremity Exam    Side: Right lower extremity  Side: Left lower extremity  Stability: No instability observed          Stability: No instability observed          Skin & Extremity Inspection: Skin color, temperature, and hair growth are WNL. No peripheral edema or cyanosis. No masses, redness, swelling, asymmetry, or associated skin lesions. No contractures.  Skin & Extremity Inspection: Skin color, temperature, and hair growth are WNL. No peripheral edema or cyanosis. No masses, redness, swelling, asymmetry, or associated skin  lesions. No contractures.  Functional ROM: Unrestricted ROM                  Functional ROM: Unrestricted ROM                  Muscle Tone/Strength: Functionally intact. No obvious neuro-muscular anomalies detected.  Muscle Tone/Strength: Functionally intact. No obvious neuro-muscular anomalies detected.  Sensory (Neurological): Unimpaired  Sensory (Neurological): Unimpaired        Palpation: No palpable anomalies  Palpation: No palpable anomalies   Assessment  Primary Diagnosis & Pertinent Problem List: The primary encounter diagnosis was Cervical spondylosis. Diagnoses of DDD (degenerative disc disease), cervical, Chronic upper extremity pain (Left), Chronic cervical radicular pain (Left), Thoracic radicular pain (Right), Lumbar spondylosis, Neurogenic pain, Opioid-induced constipation (OIC), Primary osteoarthritis involving multiple joints, Musculoskeletal pain, Chronic pain syndrome, Neuropathic pain, and Failed cervical surgery syndrome were also pertinent to this visit.  Status Diagnosis  Worsening Worsening Worsening 1. Cervical spondylosis   2. DDD (degenerative disc disease), cervical   3. Chronic upper extremity pain (Left)   4. Chronic cervical radicular pain (Left)   5. Thoracic radicular pain (Right)   6. Lumbar spondylosis   7. Neurogenic pain   8. Opioid-induced constipation (OIC)   9. Primary osteoarthritis involving multiple joints   10. Musculoskeletal pain   11. Chronic pain syndrome   12. Neuropathic pain   13. Failed cervical surgery syndrome     Problems updated and reviewed during this visit: No problems updated. Plan of Care  Pharmacotherapy (Medications Ordered): Meds ordered this encounter  Medications  . methocarbamol (ROBAXIN) 750 MG tablet    Sig: Take 1 tablet (750 mg total) by mouth every 8 (eight) hours as needed for muscle spasms.    Dispense:  90 tablet    Refill:  2    Do not place this medication, or any other prescription from our  practice, on "Automatic Refill". Patient may have prescription filled one day early if pharmacy is closed on scheduled refill date.    Order Specific Question:   Supervising Provider    Answer:   Milinda Pointer [phone]  . oxyCODONE-acetaminophen (PERCOCET/ROXICET) 5-325 MG tablet    Sig: Take 1 tablet by mouth every 6 (six) hours as needed for severe pain.    Dispense:  120 tablet    Refill:  0    Do not place this medication, or any other prescription from our practice, on "Automatic Refill". Patient may have prescription filled one day early if pharmacy is closed on scheduled refill date.    Order Specific Question:   Supervising Provider    Answer:   Milinda Pointer [phone]  . oxyCODONE-acetaminophen (PERCOCET/ROXICET) 5-325 MG tablet    Sig: Take 1 tablet by mouth every 6 (six) hours as needed for severe pain.    Dispense:  120 tablet    Refill:  0    Do not place this medication, or any other prescription from our practice, on "Automatic Refill". Patient may have prescription filled one day early if pharmacy is closed on scheduled refill date.    Order Specific Question:   Supervising Provider    Answer:   Milinda Pointer [phone]  . oxyCODONE-acetaminophen (PERCOCET/ROXICET) 5-325 MG tablet    Sig: Take 1 tablet by mouth every 6 (six) hours as needed for severe pain.    Dispense:  120 tablet    Refill:  0    Do not place this medication, or any other prescription from our practice, on "Automatic Refill". Patient may have prescription filled one day early if pharmacy is closed on scheduled refill date.    Order Specific Question:   Supervising Provider    Answer:   Milinda Pointer [phone]  . pregabalin (LYRICA) 150 MG capsule    Sig: Take 1 capsule (150 mg total) by mouth 2 (two) times daily.    Dispense:  180 capsule  Refill:  0    Do not place this medication, or any other prescription from our practice, on "Automatic Refill". Patient may have prescription filled  one day early if pharmacy is closed on scheduled refill date.    Order Specific Question:   Supervising Provider    Answer:   Milinda Pointer [[id number]]   New Prescriptions   No medications on file   Medications administered today: Sr. Pat Patrick Sr. had no medications administered during this visit. Lab-work, procedure(s), and/or referral(s): No orders of the defined types were placed in this encounter.  Imaging and/or referral(s): None  Interventional management options: Planned, scheduled, and/or pending: Palliative left-sided cervicalESI   Considering: Palliative/therapeuticbilateral lumbar facet block Palliative/therapeuticleft-sided cervicalESI Palliative/therapeuticright-sided T8-9 thoracicESI Palliative/therapeuticright-sided L5-S1TFESI Diagnostic right-sided L3-4TFESI Palliative/therapeutic right-sided L4-5 interlaminar ESI Palliative/therapeutic left hand, ring finger, trigger finger injection   Palliative PRN treatment(s): Palliativebilateral lumbar facet block Palliative left-sided cervicalESI Palliativeright-sided T8-9 thoracicESI Palliative right-sided L5-S1TFESI Palliative right-sided L4-5 interlaminar ESI Palliativeleft hand, ring finger, trigger finger injection    Provider-requested follow-up: Return in about 3 months (around 12/22/2018) for MedMgmt, and, Procedure(w/Sedation), w/ Dr. Dossie Arbour, (L) CESI.  Future Appointments  Date Time Provider [place]  10/20/2018  1:00 PM Milinda Pointer, MD [facility] None  12/22/2018  1:30 PM Vevelyn Francois, NP [facility] None   Primary Care Physician: [place] Location: [facility] Note by: Vevelyn Francois NP Date: 09/22/2018; Time: 1:14 PM  Pain Score Disclaimer: We use the NRS-11 scale. This is a self-reported, subjective measurement of pain severity with only modest accuracy. It is used primarily to identify changes  within a particular patient. It must be understood that outpatient pain scales are significantly less accurate that those used for research, where they can be applied under ideal controlled circumstances with minimal exposure to variables. In reality, the score is likely to be a combination of pain intensity and pain affect, where pain affect describes the degree of emotional arousal or changes in action readiness caused by the sensory experience of pain. Factors such as social and work situation, setting, emotional state, anxiety levels, expectation, and prior pain experience may influence pain perception and show large inter-individual differences that may also be affected by time variables.  Patient instructions provided during this appointment: Patient Instructions  ____________________________________________________________________________________________  Medication Rules  Purpose: To inform patients, and their family members, of our rules and regulations.  Applies to: All patients receiving prescriptions (written or electronic).  Pharmacy of record: Pharmacy where electronic prescriptions will be sent. If written prescriptions are taken to a different pharmacy, please inform the nursing staff. The pharmacy listed in the electronic medical record should be the one where you would like electronic prescriptions to be sent.  Electronic prescriptions: In compliance with the [place] (STOP) Act of 2017 (Session Lanny Cramp [phone]), effective October 14, 2018, all controlled substances must be electronically prescribed. Calling prescriptions to the pharmacy will cease to exist.  Prescription refills: Only during scheduled appointments. Applies to all prescriptions.  NOTE: The following applies primarily to controlled substances (Opioid* Pain Medications).   Patient's responsibilities: 1. Pain Pills: Bring all pain pills to every appointment (except for  procedure appointments). 2. Pill Bottles: Bring pills in original pharmacy bottle. Always bring the newest bottle. Bring bottle, even if empty. 3. Medication refills: You are responsible for knowing and keeping track of what medications you take and those you need refilled. The day before your appointment: write a  list of all prescriptions that need to be refilled. The day of the appointment: give the list to the admitting nurse. Prescriptions will be written only during appointments. If you forget a medication: it will not be "Called in", "Faxed", or "electronically sent". You will need to get another appointment to get these prescribed. No early refills. Do not call asking to have your prescription filled early. 4. Prescription Accuracy: You are responsible for carefully inspecting your prescriptions before leaving our office. Have the discharge nurse carefully go over each prescription with you, before taking them home. Make sure that your name is accurately spelled, that your address is correct. Check the name and dose of your medication to make sure it is accurate. Check the number of pills, and the written instructions to make sure they are clear and accurate. Make sure that you are given enough medication to last until your next medication refill appointment. 5. Taking Medication: Take medication as prescribed. When it comes to controlled substances, taking less pills or less frequently than prescribed is permitted and encouraged. Never take more pills than instructed. Never take medication more frequently than prescribed.  6. Inform other Doctors: Always inform, all of your healthcare providers, of all the medications you take. 7. Pain Medication from other Providers: You are not allowed to accept any additional pain medication from any other Doctor or Healthcare provider. There are two exceptions to this rule. (see below) In the event that you require additional pain medication, you are  responsible for notifying us, as stated below. 8. Medication Agreement: You are responsible for carefully reading and following our Medication Agreement. This must be signed before receiving any prescriptions from our practice. Safely store a copy of your signed Agreement. Violations to the Agreement will result in no further prescriptions. (Additional copies of our Medication Agreement are available upon request.) 9. Laws, Rules, & Regulations: All patients are expected to follow all Federal and [employer], Rules, [employer]. Ignorance of the Laws does not constitute a valid excuse. The use of any illegal substances is prohibited. 10. Adopted CDC guidelines & recommendations: Target dosing levels will be at or below 60 MME/day. Use of benzodiazepines** is not recommended.  Exceptions: There are only two exceptions to the rule of not receiving pain medications from other Healthcare Providers. 1. Exception #1 (Emergencies): In the event of an emergency (i.e.: accident requiring emergency care), you are allowed to receive additional pain medication. However, you are responsible for: As soon as you are able, call our office [phone], at any time of the day or night, and leave a message stating your name, the date and nature of the emergency, and the name and dose of the medication prescribed. In the event that your call is answered by a member of our staff, make sure to document and save the date, time, and the name of the person that took your information.  2. Exception #2 (Planned Surgery): In the event that you are scheduled by another doctor or dentist to have any type of surgery or procedure, you are allowed (for a period no longer than 30 days), to receive additional pain medication, for the acute post-op pain. However, in this case, you are responsible for picking up a copy of our "Post-op Pain Management for Surgeons" handout, and giving it to your surgeon or dentist. This document is  available at our office, and does not require an appointment to obtain it. Simply go to our office during business hours (Monday-Thursday  from 8:00 AM to 4:00 PM) (Friday 8:00 AM to 12:00 Noon) or if you have a scheduled appointment with [place], prior to your surgery, and ask for it by name. In addition, you will need to provide [place] with your name, name of your surgeon, type of surgery, and date of procedure or surgery.  *Opioid medications include: morphine, codeine, oxycodone, oxymorphone, hydrocodone, hydromorphone, meperidine, tramadol, tapentadol, buprenorphine, fentanyl, methadone. **Benzodiazepine medications include: diazepam (Valium), alprazolam (Xanax), clonazepam (Klonopine), lorazepam (Ativan), clorazepate (Tranxene), chlordiazepoxide (Librium), estazolam (Prosom), oxazepam (Serax), temazepam (Restoril), triazolam (Halcion) (Last updated: 12/11/2017) ____________________________________________________________________________________________   [facility] you had a great birthday!!____________________________________________________________________________________________  Preparing for Procedure with Sedation  Instructions: . Oral Intake: Do not eat or drink anything for at least 8 hours prior to your procedure. . Transportation: Public transportation is not allowed. Bring an adult driver. The driver must be physically present in our waiting room before any procedure can be started. Marland Kitchen Physical Assistance: Bring an adult physically capable of assisting you, in the event you need help. This adult should keep you company at home for at least 6 hours after the procedure. . Blood Pressure Medicine: Take your blood pressure medicine with a sip of water the morning of the procedure. . Blood thinners: Notify our staff if you are taking any blood thinners. Depending on which one you take, there will be specific instructions on how and when to stop it. . Diabetics on insulin: Notify the staff so that you  can be scheduled 1st case in the morning. If your diabetes requires high dose insulin, take only  of your normal insulin dose the morning of the procedure and notify the staff that you have done so. . Preventing infections: Shower with an antibacterial soap the morning of your procedure. . Build-up your immune system: Take 1000 mg of Vitamin C with every meal (3 times a day) the day prior to your procedure. Marland Kitchen Antibiotics: Inform the staff if you have a condition or reason that requires you to take antibiotics before dental procedures. . Pregnancy: If you are pregnant, call and cancel the procedure. . Sickness: If you have a cold, fever, or any active infections, call and cancel the procedure. . Arrival: You must be in the facility at least 30 minutes prior to your scheduled procedure. . Children: Do not bring children with you. . Dress appropriately: Bring dark clothing that you would not mind if they get stained. . Valuables: Do not bring any jewelry or valuables.  Procedure appointments are reserved for interventional treatments only. Marland Kitchen No Prescription Refills. . No medication changes will be discussed during procedure appointments. . No disability issues will be discussed.  Reasons to call and reschedule or cancel your procedure: (Following these recommendations will minimize the risk of a serious complication.) . Surgeries: Avoid having procedures within 2 weeks of any surgery. (Avoid for 2 weeks before or after any surgery). . Flu Shots: Avoid having procedures within 2 weeks of a flu shots or . (Avoid for 2 weeks before or after immunizations). . Barium: Avoid having a procedure within 7-10 days after having had a radiological study involving the use of radiological contrast. (Myelograms, Barium swallow or enema study). . Heart attacks: Avoid any elective procedures or surgeries for the initial 6 months after a "Myocardial Infarction" (Heart Attack). . Blood thinners: It is imperative  that you stop these medications before procedures. Let us know if you if you take any blood thinner.  . Infection: Avoid procedures during or within two  weeks of an infection (including chest colds or gastrointestinal problems). Symptoms associated with infections include: Localized redness, fever, chills, night sweats or profuse sweating, burning sensation when voiding, cough, congestion, stuffiness, runny nose, sore throat, diarrhea, nausea, vomiting, cold or Flu symptoms, recent or current infections. It is specially important if the infection is over the area that we intend to treat. Marland Kitchen Heart and lung problems: Symptoms that may suggest an active cardiopulmonary problem include: cough, chest pain, breathing difficulties or shortness of breath, dizziness, ankle swelling, uncontrolled high or unusually low blood pressure, and/or palpitations. If you are experiencing any of these symptoms, cancel your procedure and contact your primary care physician for an evaluation.  Remember:  Regular Business hours are:  Monday to Thursday 8:00 AM to 4:00 PM  Provider's Schedule: Milinda Pointer, MD:  Procedure days: Tuesday and Thursday 7:30 AM to 4:00 PM  Gillis Santa, MD:  Procedure days: Monday and Wednesday 7:30 AM to 4:00 PM ____________________________________________________________________________________________  ____________________________________________________________________________________________  Preparing for your procedure (without sedation)  Instructions: . Oral Intake: Do not eat or drink anything for at least 3 hours prior to your procedure. . Transportation: Unless otherwise stated by your physician, you may drive yourself after the procedure. . Blood Pressure Medicine: Take your blood pressure medicine with a sip of water the morning of the procedure. . Blood thinners: Notify our staff if you are taking any blood thinners. Depending on which one you take, there will be  specific instructions on how and when to stop it. . Diabetics on insulin: Notify the staff so that you can be scheduled 1st case in the morning. If your diabetes requires high dose insulin, take only  of your normal insulin dose the morning of the procedure and notify the staff that you have done so. . Preventing infections: Shower with an antibacterial soap the morning of your procedure.  . Build-up your immune system: Take 1000 mg of Vitamin C with every meal (3 times a day) the day prior to your procedure. Marland Kitchen Antibiotics: Inform the staff if you have a condition or reason that requires you to take antibiotics before dental procedures. . Pregnancy: If you are pregnant, call and cancel the procedure. . Sickness: If you have a cold, fever, or any active infections, call and cancel the procedure. . Arrival: You must be in the facility at least 30 minutes prior to your scheduled procedure. . Children: Do not bring any children with you. . Dress appropriately: Bring dark clothing that you would not mind if they get stained. . Valuables: Do not bring any jewelry or valuables.  Procedure appointments are reserved for interventional treatments only. Marland Kitchen No Prescription Refills. . No medication changes will be discussed during procedure appointments. . No disability issues will be discussed.  Reasons to call and reschedule or cancel your procedure: (Following these recommendations will minimize the risk of a serious complication.) . Surgeries: Avoid having procedures within 2 weeks of any surgery. (Avoid for 2 weeks before or after any surgery). . Flu Shots: Avoid having procedures within 2 weeks of a flu shots or . (Avoid for 2 weeks before or after immunizations). . Barium: Avoid having a procedure within 7-10 days after having had a radiological study involving the use of radiological contrast. (Myelograms, Barium swallow or enema study). . Heart attacks: Avoid any elective procedures or surgeries for  the initial 6 months after a "Myocardial Infarction" (Heart Attack). . Blood thinners: It is imperative that you stop these medications before procedures.  Let us know if you if you take any blood thinner.  . Infection: Avoid procedures during or within two weeks of an infection (including chest colds or gastrointestinal problems). Symptoms associated with infections include: Localized redness, fever, chills, night sweats or profuse sweating, burning sensation when voiding, cough, congestion, stuffiness, runny nose, sore throat, diarrhea, nausea, vomiting, cold or Flu symptoms, recent or current infections. It is specially important if the infection is over the area that we intend to treat. Marland Kitchen Heart and lung problems: Symptoms that may suggest an active cardiopulmonary problem include: cough, chest pain, breathing difficulties or shortness of breath, dizziness, ankle swelling, uncontrolled high or unusually low blood pressure, and/or palpitations. If you are experiencing any of these symptoms, cancel your procedure and contact your primary care physician for an evaluation.  Remember:  Regular Business hours are:  Monday to Thursday 8:00 AM to 4:00 PM  Provider's Schedule: Milinda Pointer, MD:  Procedure days: Tuesday and Thursday 7:30 AM to 4:00 PM  Gillis Santa, MD:  Procedure days: Monday and Wednesday 7:30 AM to 4:00 PM ____________________________________________________________________________________________   Epidural Steroid Injection An epidural steroid injection is a shot of steroid medicine and numbing medicine that is given into the space between the spinal cord and the bones in your back (epidural space). The shot helps relieve pain caused by an irritated or swollen nerve root. The amount of pain relief you get from the injection depends on what is causing the nerve to be swollen and irritated, and how long your pain lasts. You are more likely to benefit from this injection if your  pain is strong and comes on suddenly rather than if you have had pain for a long time. Tell a health care provider about:  Any allergies you have.  All medicines you are taking, including vitamins, herbs, eye drops, creams, and over-the-counter medicines.  Any problems you or family members have had with anesthetic medicines.  Any blood disorders you have.  Any surgeries you have had.  Any medical conditions you have.  Whether you are pregnant or may be pregnant. What are the risks? Generally, this is a safe procedure. However, problems may occur, including:  Headache.  Bleeding.  Infection.  Allergic reaction to medicines.  Damage to your nerves.  What happens before the procedure? Staying hydrated Follow instructions from your health care provider about hydration, which may include:  Up to 2 hours before the procedure - you may continue to drink clear liquids, such as water, clear fruit juice, black coffee, and plain tea.  Eating and drinking restrictions Follow instructions from your health care provider about eating and drinking, which may include:  8 hours before the procedure - stop eating heavy meals or foods such as meat, fried foods, or fatty foods.  6 hours before the procedure - stop eating light meals or foods, such as toast or cereal.  6 hours before the procedure - stop drinking milk or drinks that contain milk.  2 hours before the procedure - stop drinking clear liquids.  Medicine  You may be given medicines to lower anxiety.  Ask your health care provider about: ? Changing or stopping your regular medicines. This is especially important if you are taking diabetes medicines or blood thinners. ? Taking medicines such as aspirin and ibuprofen. These medicines can thin your blood. Do not take these medicines before your procedure if your health care provider instructs you not to. General instructions  Plan to have someone take you home from  the  hospital or clinic. What happens during the procedure?  You may receive a medicine to help you relax (sedative).  You will be asked to lie on your abdomen.  The injection site will be cleaned.  A numbing medicine (local anesthetic) will be used to numb the injection site.  A needle will be inserted through your skin into the epidural space. You may feel some discomfort when this happens. An X-ray machine will be used to make sure the needle is put as close as possible to the affected nerve.  A steroid medicine and a local anesthetic will be injected into the epidural space.  The needle will be removed.  A bandage (dressing) will be put over the injection site. What happens after the procedure?  Your blood pressure, heart rate, breathing rate, and blood oxygen level will be monitored until the medicines you were given have worn off.  Your arm or leg may feel weak or numb for a few hours.  The injection site may feel sore.  Do not drive for 24 hours if you received a sedative. This information is not intended to replace advice given to you by your health care provider. Make sure you discuss any questions you have with your health care provider. Document Released: 01/07/2008 Document Revised: 03/13/2016 Document Reviewed: 01/16/2016 Elsevier Interactive Patient Education  [employer].

## 2018-10-20 ENCOUNTER — Ambulatory Visit: Payer: Medicare Other | Admitting: Pain Medicine

## 2018-11-10 ENCOUNTER — Ambulatory Visit: Payer: Medicare Other | Admitting: Pain Medicine

## 2018-11-10 NOTE — Progress Notes (Deleted)
Patient's Name: Rick MAGRO Sr.  MRN: [id number]  Referring Provider: Center, Laie  DOB: December 20, 1950  PCP: Center, [place]  DOS: 11/10/2018  Note by: Gaspar Cola, MD  Service setting: Ambulatory outpatient  Specialty: Interventional Pain Management  Patient type: Established  Location: [facility] (AMB) Pain Management Facility  Visit type: Interventional Procedure   Primary Reason for Visit: Interventional Pain Management Treatment. CC: No chief complaint on file.  Procedure:          Anesthesia, Analgesia, Anxiolysis:  Type: Palliative, Inter-Laminar, Cervical Epidural Steroid Injection           Region: Posterior Cervico-thoracic Region Level: C7-T1 Laterality: Left-Sided Paramedial  Type: Local Anesthesia Indication(s): Analgesia         Route: Infiltration ([place]/IM) IV Access: Declined Sedation: Declined  Local Anesthetic: Lidocaine 1-2%  Position: Prone with head of the table was raised to facilitate breathing.   Indications: 1. DDD (degenerative disc disease), cervical   2. Chronic cervical radicular pain (Left)   3. Cervical spondylosis   4. Chronic upper extremity pain (Left)   5. Failed cervical surgery syndrome    Pain Score: Pre-procedure:  /10 Post-procedure:  /10  Pre-op Assessment:  Rick Carter is a 68 y.o. (year old), male patient, seen today for interventional treatment. He  has a past surgical history that includes Tonsillectomy; Lung removal, partial; and Neck surgery. Rick Carter has a current medication list which includes the following prescription(s): acetylcysteine, aclidinium bromide, advair diskus, albuterol, aspirin ec, calcium carb-cholecalciferol, cholecalciferol, clonazepam, diphenhydramine, docusate sodium, escitalopram, fluticasone-salmeterol, guaifenesin, hydrochlorothiazide, ibuprofen, ipratropium-albuterol, loratadine, methocarbamol, multiple vitamins-minerals, oxycodone-acetaminophen, oxycodone-acetaminophen, oxycodone-acetaminophen,  pantoprazole, pregabalin, sildenafil, sodium chloride hypertonic, trazodone, tudorza pressair, and ventolin hfa. His primarily concern today is the No chief complaint on file.  Initial Vital Signs:  Pulse/HCG Rate:    Temp:   Resp:   BP:   SpO2:    BMI: Estimated body mass index is 19.35 kg/m as calculated from the following:   Height as of 09/22/18: _0  (1.753 m).   Weight as of 09/22/18: 131 lb (59.4 kg).  Risk Assessment: Allergies: Reviewed. He is allergic to atorvastatin and varenicline.  Allergy Precautions: None required Coagulopathies: Reviewed. None identified.  Blood-thinner therapy: None at this time Active Infection(s): Reviewed. None identified. Rick Carter is afebrile  Site Confirmation: Rick Carter was asked to confirm the procedure and laterality before marking the site Procedure checklist: Completed Consent: Before the procedure and under the influence of no sedative(s), amnesic(s), or anxiolytics, the patient was informed of the treatment options, risks and possible complications. To fulfill our ethical and legal obligations, as recommended by the American Medical Association's Code of Ethics, I have informed the patient of my clinical impression; the nature and purpose of the treatment or procedure; the risks, benefits, and possible complications of the intervention; the alternatives, including doing nothing; the risk(s) and benefit(s) of the alternative treatment(s) or procedure(s); and the risk(s) and benefit(s) of doing nothing. The patient was provided information about the general risks and possible complications associated with the procedure. These may include, but are not limited to: failure to achieve desired goals, infection, bleeding, organ or nerve damage, allergic reactions, paralysis, and death. In addition, the patient was informed of those risks and complications associated to Spine-related procedures, such as failure to decrease pain; infection (i.e.:  Meningitis, epidural or intraspinal abscess); bleeding (i.e.: epidural hematoma, subarachnoid hemorrhage, or any other type of intraspinal or peri-dural bleeding); organ or nerve damage (i.e.: Any type  of peripheral nerve, nerve root, or spinal cord injury) with subsequent damage to sensory, motor, and/or autonomic systems, resulting in permanent pain, numbness, and/or weakness of one or several areas of the body; allergic reactions; (i.e.: anaphylactic reaction); and/or death. Furthermore, the patient was informed of those risks and complications associated with the medications. These include, but are not limited to: allergic reactions (i.e.: anaphylactic or anaphylactoid reaction(s)); adrenal axis suppression; blood sugar elevation that in diabetics may result in ketoacidosis or comma; water retention that in patients with history of congestive heart failure may result in shortness of breath, pulmonary edema, and decompensation with resultant heart failure; weight gain; swelling or edema; medication-induced neural toxicity; particulate matter embolism and blood vessel occlusion with resultant organ, and/or nervous system infarction; and/or aseptic necrosis of one or more joints. Finally, the patient was informed that Medicine is not an exact science; therefore, there is also the possibility of unforeseen or unpredictable risks and/or possible complications that may result in a catastrophic outcome. The patient indicated having understood very clearly. We have given the patient no guarantees and we have made no promises. Enough time was given to the patient to ask questions, all of which were answered to the patient's satisfaction. Rick Carter has indicated that he wanted to continue with the procedure. Attestation: I, the ordering provider, attest that I have discussed with the patient the benefits, risks, side-effects, alternatives, likelihood of achieving goals, and potential problems during recovery for the  procedure that I have provided informed consent. Date  Time: {CHL [facility]-PAIN TIME CHOICES:[id number]}  Pre-Procedure Preparation:  Monitoring: As per clinic protocol. Respiration, ETCO2, SpO2, BP, heart rate and rhythm monitor placed and checked for adequate function Safety Precautions: Patient was assessed for positional comfort and pressure points before starting the procedure. Time-out: I initiated and conducted the "Time-out" before starting the procedure, as per protocol. The patient was asked to participate by confirming the accuracy of the "Time Out" information. Verification of the correct person, site, and procedure were performed and confirmed by me, the nursing staff, and the patient. "Time-out" conducted as per Joint Commission's Universal Protocol (UP.01.01.01). Time:    Description of Procedure:          Target Area: For Epidural Steroid injections the target is the interlaminar space, initially targeting the lower border of the superior vertebral body lamina. Approach: Paramedial approach. Area Prepped: Entire PosteriorCervical Region Prepping solution: ChloraPrep (2% chlorhexidine gluconate and 70% isopropyl alcohol) Safety Precautions: Aspiration looking for blood return was conducted prior to all injections. At no point did we inject any substances, as a needle was being advanced. No attempts were made at seeking any paresthesias. Safe injection practices and needle disposal techniques used. Medications properly checked for expiration dates. SDV (single dose vial) medications used. Description of the Procedure: Protocol guidelines were followed. The procedure needle was introduced through the skin, ipsilateral to the reported pain, and advanced to the target area. Bone was contacted and the needle walked caudad, until the lamina was cleared. The epidural space was identified using "loss-of-resistance technique" with 2-3 ml of PF-NaCl (0.9% NSS), in a 5cc LOR glass syringe. There were  no vitals filed for this visit.  Start Time:   hrs. End Time:   hrs. Materials:  Needle(s) Type: Epidural needle Gauge: 17G Length: 3.5-in Medication(s): Please see orders for medications and dosing details.  Imaging Guidance (Spinal):          Type of Imaging Technique: Fluoroscopy Guidance (Spinal) Indication(s): Assistance in needle guidance  and placement for procedures requiring needle placement in or near specific anatomical locations not easily accessible without such assistance. Exposure Time: Please see nurses notes. Contrast: Before injecting any contrast, we confirmed that the patient did not have an allergy to iodine, shellfish, or radiological contrast. Once satisfactory needle placement was completed at the desired level, radiological contrast was injected. Contrast injected under live fluoroscopy. No contrast complications. See chart for type and volume of contrast used. Fluoroscopic Guidance: I was personally present during the use of fluoroscopy. "Tunnel Vision Technique" used to obtain the best possible view of the target area. Parallax error corrected before commencing the procedure. "Direction-depth-direction" technique used to introduce the needle under continuous pulsed fluoroscopy. Once target was reached, antero-posterior, oblique, and lateral fluoroscopic projection used confirm needle placement in all planes. Images permanently stored in EMR. Interpretation: I personally interpreted the imaging intraoperatively. Adequate needle placement confirmed in multiple planes. Appropriate spread of contrast into desired area was observed. No evidence of afferent or efferent intravascular uptake. No intrathecal or subarachnoid spread observed. Permanent images saved into the patient's record.  Antibiotic Prophylaxis:   Anti-infectives (From admission, onward)   None     Indication(s): None identified  Post-operative Assessment:  Post-procedure Vital Signs:  Pulse/HCG Rate:     Temp:   Resp:   BP:   SpO2:    EBL: None  Complications: No immediate post-treatment complications observed by team, or reported by patient.  Note: The patient tolerated the entire procedure well. A repeat set of vitals were taken after the procedure and the patient was kept under observation following institutional policy, for this type of procedure. Post-procedural neurological assessment was performed, showing return to baseline, prior to discharge. The patient was provided with post-procedure discharge instructions, including a section on how to identify potential problems. Should any problems arise concerning this procedure, the patient was given instructions to immediately contact us, at any time, without hesitation. In any case, we plan to contact the patient by telephone for a follow-up status report regarding this interventional procedure.  Comments:  No additional relevant information.  Plan of Care   Imaging Orders  No imaging studies ordered today   Procedure Orders    No procedure(s) ordered today    Medications ordered for procedure: No orders of the defined types were placed in this encounter.  Medications administered: Sr. Pat Patrick Sr. had no medications administered during this visit.  See the medical record for exact dosing, route, and time of administration.  Disposition: Discharge home  Discharge Date & Time: 11/10/2018;   hrs.   Physician-requested Follow-up: No follow-ups on file.  Future Appointments  Date Time Provider [place]  11/10/2018  1:00 PM Milinda Pointer, MD [facility] None  12/22/2018  1:30 PM Vevelyn Francois, NP [facility] None   Primary Care Physician: Center, [place] Location: [facility] Outpatient Pain Management Facility Note by: Gaspar Cola, MD Date: 11/10/2018; Time: 7:50 AM  Disclaimer:  Medicine is not an Chief Strategy Officer. The only guarantee in medicine is that nothing is guaranteed. It is important to note  that the decision to proceed with this intervention was based on the information collected from the patient. The Data and conclusions were drawn from the patient's questionnaire, the interview, and the physical examination. Because the information was provided in large part by the patient, it cannot be guaranteed that it has not been purposely or unconsciously manipulated. Every effort has been made to obtain as much relevant data as possible  for this evaluation. It is important to note that the conclusions that lead to this procedure are derived in large part from the available data. Always take into account that the treatment will also be dependent on availability of resources and existing treatment guidelines, considered by other Pain Management Practitioners as being common knowledge and practice, at the time of the intervention. For Medico-Legal purposes, it is also important to point out that variation in procedural techniques and pharmacological choices are the acceptable norm. The indications, contraindications, technique, and results of the above procedure should only be interpreted and judged by a Board-Certified Interventional Pain Specialist with extensive familiarity and expertise in the same exact procedure and technique.

## 2018-11-24 ENCOUNTER — Ambulatory Visit: Payer: Medicare Other | Admitting: Pain Medicine

## 2018-11-24 NOTE — Progress Notes (Deleted)
Patient's Name: Rick MERRIOTT Sr.  MRN: [id number]  Referring Provider: [place], [place]  DOB: 11/30/1950  PCP: [place], [place]  DOS: 11/24/2018  Note by: Gaspar Cola, MD  Service setting: Ambulatory outpatient  Specialty: Interventional Pain Management  Patient type: Established  Location: [facility] (AMB) Pain Management Facility  Visit type: Interventional Procedure   Primary Reason for Visit: Interventional Pain Management Treatment. CC: No chief complaint on file.  Procedure:          Anesthesia, Analgesia, Anxiolysis:  Type: Palliative, Inter-Laminar, Cervical Epidural Steroid Injection  (Last on 11/25/17)  Region: Posterior Cervico-thoracic Region Level: C7-T1 Laterality: Left-Sided Paramedial  Type: Local Anesthesia Indication(s): Analgesia         Route: Infiltration ([place]/IM) IV Access: Declined Sedation: Declined  Local Anesthetic: Lidocaine 1-2%  Position: Prone with head of the table was raised to facilitate breathing.   Indications: 1. DDD (degenerative disc disease), cervical   2. Chronic cervical radicular pain (Left)   3. Cervical spondylosis    Pain Score: Pre-procedure:  /10 Post-procedure:  /10  Pre-op Assessment:  Rick Carter is a 68 y.o. (year old), male patient, seen today for interventional treatment. He  has a past surgical history that includes Tonsillectomy; Lung removal, partial; and Neck surgery. Rick Carter has a current medication list which includes the following prescription(s): acetylcysteine, aclidinium bromide, advair diskus, albuterol, aspirin ec, calcium carb-cholecalciferol, cholecalciferol, clonazepam, diphenhydramine, docusate sodium, escitalopram, fluticasone-salmeterol, guaifenesin, hydrochlorothiazide, ibuprofen, ipratropium-albuterol, loratadine, methocarbamol, multiple vitamins-minerals, oxycodone-acetaminophen, oxycodone-acetaminophen, oxycodone-acetaminophen, pantoprazole, pregabalin, sildenafil, sodium chloride hypertonic, trazodone,  tudorza pressair, and ventolin hfa. His primarily concern today is the No chief complaint on file.  Initial Vital Signs:  Pulse/HCG Rate:    Temp:   Resp:   BP:   SpO2:    BMI: Estimated body mass index is 19.35 kg/m as calculated from the following:   Height as of 09/22/18: _0  (1.753 m).   Weight as of 09/22/18: 131 lb (59.4 kg).  Risk Assessment: Allergies: Reviewed. He is allergic to atorvastatin and varenicline.  Allergy Precautions: None required Coagulopathies: Reviewed. None identified.  Blood-thinner therapy: None at this time Active Infection(s): Reviewed. None identified. Rick Carter is afebrile  Site Confirmation: Rick Carter was asked to confirm the procedure and laterality before marking the site Procedure checklist: Completed Consent: Before the procedure and under the influence of no sedative(s), amnesic(s), or anxiolytics, the patient was informed of the treatment options, risks and possible complications. To fulfill our ethical and legal obligations, as recommended by the American Medical Association's Code of Ethics, I have informed the patient of my clinical impression; the nature and purpose of the treatment or procedure; the risks, benefits, and possible complications of the intervention; the alternatives, including doing nothing; the risk(s) and benefit(s) of the alternative treatment(s) or procedure(s); and the risk(s) and benefit(s) of doing nothing. The patient was provided information about the general risks and possible complications associated with the procedure. These may include, but are not limited to: failure to achieve desired goals, infection, bleeding, organ or nerve damage, allergic reactions, paralysis, and death. In addition, the patient was informed of those risks and complications associated to Spine-related procedures, such as failure to decrease pain; infection (i.e.: Meningitis, epidural or intraspinal abscess); bleeding (i.e.: epidural hematoma,  subarachnoid hemorrhage, or any other type of intraspinal or peri-dural bleeding); organ or nerve damage (i.e.: Any type of peripheral nerve, nerve root, or spinal cord injury) with subsequent damage to sensory, motor, and/or autonomic systems, resulting in  permanent pain, numbness, and/or weakness of one or several areas of the body; allergic reactions; (i.e.: anaphylactic reaction); and/or death. Furthermore, the patient was informed of those risks and complications associated with the medications. These include, but are not limited to: allergic reactions (i.e.: anaphylactic or anaphylactoid reaction(s)); adrenal axis suppression; blood sugar elevation that in diabetics may result in ketoacidosis or comma; water retention that in patients with history of congestive heart failure may result in shortness of breath, pulmonary edema, and decompensation with resultant heart failure; weight gain; swelling or edema; medication-induced neural toxicity; particulate matter embolism and blood vessel occlusion with resultant organ, and/or nervous system infarction; and/or aseptic necrosis of one or more joints. Finally, the patient was informed that Medicine is not an exact science; therefore, there is also the possibility of unforeseen or unpredictable risks and/or possible complications that may result in a catastrophic outcome. The patient indicated having understood very clearly. We have given the patient no guarantees and we have made no promises. Enough time was given to the patient to ask questions, all of which were answered to the patient's satisfaction. Rick Carter has indicated that he wanted to continue with the procedure. Attestation: I, the ordering provider, attest that I have discussed with the patient the benefits, risks, side-effects, alternatives, likelihood of achieving goals, and potential problems during recovery for the procedure that I have provided informed consent. Date  Time: {CHL [facility]-PAIN TIME  CHOICES:[id number]}  Pre-Procedure Preparation:  Monitoring: As per clinic protocol. Respiration, ETCO2, SpO2, BP, heart rate and rhythm monitor placed and checked for adequate function Safety Precautions: Patient was assessed for positional comfort and pressure points before starting the procedure. Time-out: I initiated and conducted the "Time-out" before starting the procedure, as per protocol. The patient was asked to participate by confirming the accuracy of the "Time Out" information. Verification of the correct person, site, and procedure were performed and confirmed by me, the nursing staff, and the patient. "Time-out" conducted as per Joint Commission's Universal Protocol (UP.01.01.01). Time:    Description of Procedure:          Target Area: For Epidural Steroid injections the target is the interlaminar space, initially targeting the lower border of the superior vertebral body lamina. Approach: Paramedial approach. Area Prepped: Entire PosteriorCervical Region Prepping solution: ChloraPrep (2% chlorhexidine gluconate and 70% isopropyl alcohol) Safety Precautions: Aspiration looking for blood return was conducted prior to all injections. At no point did we inject any substances, as a needle was being advanced. No attempts were made at seeking any paresthesias. Safe injection practices and needle disposal techniques used. Medications properly checked for expiration dates. SDV (single dose vial) medications used. Description of the Procedure: Protocol guidelines were followed. The procedure needle was introduced through the skin, ipsilateral to the reported pain, and advanced to the target area. Bone was contacted and the needle walked caudad, until the lamina was cleared. The epidural space was identified using "loss-of-resistance technique" with 2-3 ml of PF-NaCl (0.9% NSS), in a 5cc LOR glass syringe. There were no vitals filed for this visit.  Start Time:   hrs. End Time:   hrs. Materials:   Needle(s) Type: Epidural needle Gauge: 17G Length: 3.5-in Medication(s): Please see orders for medications and dosing details.  Imaging Guidance (Spinal):          Type of Imaging Technique: Fluoroscopy Guidance (Spinal) Indication(s): Assistance in needle guidance and placement for procedures requiring needle placement in or near specific anatomical locations not easily accessible without such assistance. Exposure  Time: Please see nurses notes. Contrast: Before injecting any contrast, we confirmed that the patient did not have an allergy to iodine, shellfish, or radiological contrast. Once satisfactory needle placement was completed at the desired level, radiological contrast was injected. Contrast injected under live fluoroscopy. No contrast complications. See chart for type and volume of contrast used. Fluoroscopic Guidance: I was personally present during the use of fluoroscopy. "Tunnel Vision Technique" used to obtain the best possible view of the target area. Parallax error corrected before commencing the procedure. "Direction-depth-direction" technique used to introduce the needle under continuous pulsed fluoroscopy. Once target was reached, antero-posterior, oblique, and lateral fluoroscopic projection used confirm needle placement in all planes. Images permanently stored in EMR. Interpretation: I personally interpreted the imaging intraoperatively. Adequate needle placement confirmed in multiple planes. Appropriate spread of contrast into desired area was observed. No evidence of afferent or efferent intravascular uptake. No intrathecal or subarachnoid spread observed. Permanent images saved into the patient's record.  Antibiotic Prophylaxis:   Anti-infectives (From admission, onward)   None     Indication(s): None identified  Post-operative Assessment:  Post-procedure Vital Signs:  Pulse/HCG Rate:    Temp:   Resp:   BP:   SpO2:    EBL: None  Complications: No immediate  post-treatment complications observed by team, or reported by patient.  Note: The patient tolerated the entire procedure well. A repeat set of vitals were taken after the procedure and the patient was kept under observation following institutional policy, for this type of procedure. Post-procedural neurological assessment was performed, showing return to baseline, prior to discharge. The patient was provided with post-procedure discharge instructions, including a section on how to identify potential problems. Should any problems arise concerning this procedure, the patient was given instructions to immediately contact us, at any time, without hesitation. In any case, we plan to contact the patient by telephone for a follow-up status report regarding this interventional procedure.  Comments:  No additional relevant information.  Plan of Care   Imaging Orders  No imaging studies ordered today   Procedure Orders    No procedure(s) ordered today    Medications ordered for procedure: No orders of the defined types were placed in this encounter.  Medications administered: Sr. Pat Patrick Sr. had no medications administered during this visit.  See the medical record for exact dosing, route, and time of administration.  Disposition: Discharge home  Discharge Date & Time: 11/24/2018;   hrs.   Physician-requested Follow-up: No follow-ups on file.  Future Appointments  Date Time Provider [place]  11/24/2018 12:45 PM Milinda Pointer, MD [facility] None  12/22/2018  1:30 PM Vevelyn Francois, NP [facility] None   Primary Care Physician: Center, [place] Location: [facility] Outpatient Pain Management Facility Note by: Gaspar Cola, MD Date: 11/24/2018; Time: 6:26 AM  Disclaimer:  Medicine is not an Chief Strategy Officer. The only guarantee in medicine is that nothing is guaranteed. It is important to note that the decision to proceed with this intervention was based on the information  collected from the patient. The Data and conclusions were drawn from the patient's questionnaire, the interview, and the physical examination. Because the information was provided in large part by the patient, it cannot be guaranteed that it has not been purposely or unconsciously manipulated. Every effort has been made to obtain as much relevant data as possible for this evaluation. It is important to note that the conclusions that lead to this procedure are derived in large part  from the available data. Always take into account that the treatment will also be dependent on availability of resources and existing treatment guidelines, considered by other Pain Management Practitioners as being common knowledge and practice, at the time of the intervention. For Medico-Legal purposes, it is also important to point out that variation in procedural techniques and pharmacological choices are the acceptable norm. The indications, contraindications, technique, and results of the above procedure should only be interpreted and judged by a Board-Certified Interventional Pain Specialist with extensive familiarity and expertise in the same exact procedure and technique.

## 2018-11-26 ENCOUNTER — Ambulatory Visit: Payer: Medicare Other | Admitting: Pain Medicine

## 2018-11-26 DIAGNOSIS — M542 Cervicalgia: Secondary | ICD-10-CM | POA: Insufficient documentation

## 2018-11-26 NOTE — Progress Notes (Deleted)
Patient's Name: Rick STORLIE Sr.  MRN: [id number]  Referring Provider: [facility], [place]  DOB: 05/08/1951  PCP: [facility], [place]  DOS: 11/26/2018  Note by: Gaspar Cola, MD  Service setting: Ambulatory outpatient  Specialty: Interventional Pain Management  Patient type: Established  Location: [facility] (AMB) Pain Management Facility  Visit type: Interventional Procedure   Primary Reason for Visit: Interventional Pain Management Treatment. CC: No chief complaint on file.  Procedure:          Anesthesia, Analgesia, Anxiolysis:  Type: Palliative, Inter-Laminar, Cervical Epidural Steroid Injection  (Last done on 11/25/2017)  Region: Posterior Cervico-thoracic Region Level: C7-T1 Laterality: Left-Sided Paramedial  Type: Local Anesthesia Indication(s): Analgesia         Route: Infiltration ([place]/IM) IV Access: Declined Sedation: Declined  Local Anesthetic: Lidocaine 1-2%  Position: Prone with head of the table was raised to facilitate breathing.   Indications: 1. DDD (degenerative disc disease), cervical   2. Cervicalgia   3. Cervical spondylosis   4. Chronic cervical radicular pain (Left)   5. Failed cervical surgery syndrome    Pain Score: Pre-procedure:  /10 Post-procedure:  /10  Pre-op Assessment:  Rick Carter is a 68 y.o. (year old), male patient, seen today for interventional treatment. He  has a past surgical history that includes Tonsillectomy; Lung removal, partial; and Neck surgery. Rick Carter has a current medication list which includes the following prescription(s): acetylcysteine, aclidinium bromide, advair diskus, albuterol, aspirin ec, calcium carb-cholecalciferol, cholecalciferol, clonazepam, diphenhydramine, docusate sodium, escitalopram, fluticasone-salmeterol, guaifenesin, hydrochlorothiazide, ibuprofen, ipratropium-albuterol, loratadine, methocarbamol, multiple vitamins-minerals, oxycodone-acetaminophen, oxycodone-acetaminophen, oxycodone-acetaminophen, pantoprazole,  pregabalin, sildenafil, sodium chloride hypertonic, trazodone, tudorza pressair, and ventolin hfa. His primarily concern today is the No chief complaint on file.  Initial Vital Signs:  Pulse/HCG Rate:    Temp:   Resp:   BP:   SpO2:    BMI: Estimated body mass index is 19.35 kg/m as calculated from the following:   Height as of 09/22/18: _0  (1.753 m).   Weight as of 09/22/18: 131 lb (59.4 kg).  Risk Assessment: Allergies: Reviewed. He is allergic to atorvastatin and varenicline.  Allergy Precautions: None required Coagulopathies: Reviewed. None identified.  Blood-thinner therapy: None at this time Active Infection(s): Reviewed. None identified. Rick Carter is afebrile  Site Confirmation: Rick Carter was asked to confirm the procedure and laterality before marking the site Procedure checklist: Completed Consent: Before the procedure and under the influence of no sedative(s), amnesic(s), or anxiolytics, the patient was informed of the treatment options, risks and possible complications. To fulfill our ethical and legal obligations, as recommended by the American Medical Association's Code of Ethics, I have informed the patient of my clinical impression; the nature and purpose of the treatment or procedure; the risks, benefits, and possible complications of the intervention; the alternatives, including doing nothing; the risk(s) and benefit(s) of the alternative treatment(s) or procedure(s); and the risk(s) and benefit(s) of doing nothing. The patient was provided information about the general risks and possible complications associated with the procedure. These may include, but are not limited to: failure to achieve desired goals, infection, bleeding, organ or nerve damage, allergic reactions, paralysis, and death. In addition, the patient was informed of those risks and complications associated to Spine-related procedures, such as failure to decrease pain; infection (i.e.: Meningitis,  epidural or intraspinal abscess); bleeding (i.e.: epidural hematoma, subarachnoid hemorrhage, or any other type of intraspinal or peri-dural bleeding); organ or nerve damage (i.e.: Any type of peripheral nerve, nerve root, or spinal cord  injury) with subsequent damage to sensory, motor, and/or autonomic systems, resulting in permanent pain, numbness, and/or weakness of one or several areas of the body; allergic reactions; (i.e.: anaphylactic reaction); and/or death. Furthermore, the patient was informed of those risks and complications associated with the medications. These include, but are not limited to: allergic reactions (i.e.: anaphylactic or anaphylactoid reaction(s)); adrenal axis suppression; blood sugar elevation that in diabetics may result in ketoacidosis or comma; water retention that in patients with history of congestive heart failure may result in shortness of breath, pulmonary edema, and decompensation with resultant heart failure; weight gain; swelling or edema; medication-induced neural toxicity; particulate matter embolism and blood vessel occlusion with resultant organ, and/or nervous system infarction; and/or aseptic necrosis of one or more joints. Finally, the patient was informed that Medicine is not an exact science; therefore, there is also the possibility of unforeseen or unpredictable risks and/or possible complications that may result in a catastrophic outcome. The patient indicated having understood very clearly. We have given the patient no guarantees and we have made no promises. Enough time was given to the patient to ask questions, all of which were answered to the patient's satisfaction. Rick Carter has indicated that he wanted to continue with the procedure. Attestation: I, the ordering provider, attest that I have discussed with the patient the benefits, risks, side-effects, alternatives, likelihood of achieving goals, and potential problems during recovery for the procedure  that I have provided informed consent. Date  Time: {CHL [facility]-PAIN TIME CHOICES:[id number]}  Pre-Procedure Preparation:  Monitoring: As per clinic protocol. Respiration, ETCO2, SpO2, BP, heart rate and rhythm monitor placed and checked for adequate function Safety Precautions: Patient was assessed for positional comfort and pressure points before starting the procedure. Time-out: I initiated and conducted the "Time-out" before starting the procedure, as per protocol. The patient was asked to participate by confirming the accuracy of the "Time Out" information. Verification of the correct person, site, and procedure were performed and confirmed by me, the nursing staff, and the patient. "Time-out" conducted as per Joint Commission's Universal Protocol (UP.01.01.01). Time:    Description of Procedure:          Target Area: For Epidural Steroid injections the target is the interlaminar space, initially targeting the lower border of the superior vertebral body lamina. Approach: Paramedial approach. Area Prepped: Entire PosteriorCervical Region Prepping solution: ChloraPrep (2% chlorhexidine gluconate and 70% isopropyl alcohol) Safety Precautions: Aspiration looking for blood return was conducted prior to all injections. At no point did we inject any substances, as a needle was being advanced. No attempts were made at seeking any paresthesias. Safe injection practices and needle disposal techniques used. Medications properly checked for expiration dates. SDV (single dose vial) medications used. Description of the Procedure: Protocol guidelines were followed. The procedure needle was introduced through the skin, ipsilateral to the reported pain, and advanced to the target area. Bone was contacted and the needle walked caudad, until the lamina was cleared. The epidural space was identified using "loss-of-resistance technique" with 2-3 ml of PF-NaCl (0.9% NSS), in a 5cc LOR glass syringe. There were no vitals  filed for this visit.  Start Time:   hrs. End Time:   hrs. Materials:  Needle(s) Type: Epidural needle Gauge: 17G Length: 3.5-in Medication(s): Please see orders for medications and dosing details.  Imaging Guidance (Spinal):          Type of Imaging Technique: Fluoroscopy Guidance (Spinal) Indication(s): Assistance in needle guidance and placement for procedures requiring needle placement in  or near specific anatomical locations not easily accessible without such assistance. Exposure Time: Please see nurses notes. Contrast: Before injecting any contrast, we confirmed that the patient did not have an allergy to iodine, shellfish, or radiological contrast. Once satisfactory needle placement was completed at the desired level, radiological contrast was injected. Contrast injected under live fluoroscopy. No contrast complications. See chart for type and volume of contrast used. Fluoroscopic Guidance: I was personally present during the use of fluoroscopy. "Tunnel Vision Technique" used to obtain the best possible view of the target area. Parallax error corrected before commencing the procedure. "Direction-depth-direction" technique used to introduce the needle under continuous pulsed fluoroscopy. Once target was reached, antero-posterior, oblique, and lateral fluoroscopic projection used confirm needle placement in all planes. Images permanently stored in EMR. Interpretation: I personally interpreted the imaging intraoperatively. Adequate needle placement confirmed in multiple planes. Appropriate spread of contrast into desired area was observed. No evidence of afferent or efferent intravascular uptake. No intrathecal or subarachnoid spread observed. Permanent images saved into the patient's record.  Antibiotic Prophylaxis:   Anti-infectives (From admission, onward)   None     Indication(s): None identified  Post-operative Assessment:  Post-procedure Vital Signs:  Pulse/HCG Rate:    Temp:    Resp:   BP:   SpO2:    EBL: None  Complications: No immediate post-treatment complications observed by team, or reported by patient.  Note: The patient tolerated the entire procedure well. A repeat set of vitals were taken after the procedure and the patient was kept under observation following institutional policy, for this type of procedure. Post-procedural neurological assessment was performed, showing return to baseline, prior to discharge. The patient was provided with post-procedure discharge instructions, including a section on how to identify potential problems. Should any problems arise concerning this procedure, the patient was given instructions to immediately contact us, at any time, without hesitation. In any case, we plan to contact the patient by telephone for a follow-up status report regarding this interventional procedure.  Comments:  No additional relevant information.  Plan of Care   Imaging Orders  No imaging studies ordered today   Procedure Orders    No procedure(s) ordered today    Medications ordered for procedure: No orders of the defined types were placed in this encounter.  Medications administered: Sr. Pat Patrick Sr. had no medications administered during this visit.  See the medical record for exact dosing, route, and time of administration.  Disposition: Discharge home  Discharge Date & Time: 11/26/2018;   hrs.   Physician-requested Follow-up: No follow-ups on file.  Future Appointments  Date Time Provider [place]  11/26/2018 12:30 PM Milinda Pointer, MD [facility] None  12/22/2018  1:30 PM Vevelyn Francois, NP [facility] None   Primary Care Physician: [facility], [place] Location: [facility] Out[facility] Facility Note by: Gaspar Cola, MD Date: 11/26/2018; Time: 7:53 AM  Disclaimer:  Medicine is not an Chief Strategy Officer. The only guarantee in medicine is that nothing is guaranteed. It is important to note that the  decision to proceed with this intervention was based on the information collected from the patient. The Data and conclusions were drawn from the patient's questionnaire, the interview, and the physical examination. Because the information was provided in large part by the patient, it cannot be guaranteed that it has not been purposely or unconsciously manipulated. Every effort has been made to obtain as much relevant data as possible for this evaluation. It is important to note that  the conclusions that lead to this procedure are derived in large part from the available data. Always take into account that the treatment will also be dependent on availability of resources and existing treatment guidelines, considered by other Pain Management Practitioners as being common knowledge and practice, at the time of the intervention. For Medico-Legal purposes, it is also important to point out that variation in procedural techniques and pharmacological choices are the acceptable norm. The indications, contraindications, technique, and results of the above procedure should only be interpreted and judged by a Board-Certified Interventional Pain Specialist with extensive familiarity and expertise in the same exact procedure and technique.

## 2018-12-16 DIAGNOSIS — F329 Major depressive disorder, single episode, unspecified: Secondary | ICD-10-CM | POA: Insufficient documentation

## 2018-12-16 DIAGNOSIS — F32A Depression, unspecified: Secondary | ICD-10-CM | POA: Insufficient documentation

## 2018-12-16 DIAGNOSIS — F1021 Alcohol dependence, in remission: Secondary | ICD-10-CM | POA: Insufficient documentation

## 2018-12-16 DIAGNOSIS — F419 Anxiety disorder, unspecified: Secondary | ICD-10-CM | POA: Insufficient documentation

## 2018-12-22 ENCOUNTER — Ambulatory Visit: Payer: Medicare Other | Admitting: Nurse Practitioner

## 2018-12-31 ENCOUNTER — Telehealth: Payer: Self-pay

## 2018-12-31 NOTE — Telephone Encounter (Signed)
Patient states he just wanted to tell me that he was doing much better.

## 2018-12-31 NOTE — Telephone Encounter (Signed)
He wants to speak to [facility].. I couldn't do anything for him, he just wants to speak to [facility].

## 2019-01-19 ENCOUNTER — Ambulatory Visit (HOSPITAL_BASED_OUTPATIENT_CLINIC_OR_DEPARTMENT_OTHER): Payer: Medicare Other | Admitting: Pain Medicine

## 2019-01-19 ENCOUNTER — Ambulatory Visit
Admission: RE | Admit: 2019-01-19 | Discharge: 2019-01-19 | Disposition: A | Discharge status: Home / Self Care | Payer: Medicare Other | Source: Ambulatory Visit | Attending: Pain Medicine | Admitting: Pain Medicine

## 2019-01-19 ENCOUNTER — Encounter: Payer: Self-pay | Admitting: Pain Medicine

## 2019-01-19 ENCOUNTER — Other Ambulatory Visit: Payer: Self-pay

## 2019-01-19 VITALS — BP 163/77 | HR 90 | Temp 96.8°F | Resp 22 | Ht 69.0 in | Wt 126.0 lb

## 2019-01-19 DIAGNOSIS — M47812 Spondylosis without myelopathy or radiculopathy, cervical region: Secondary | ICD-10-CM | POA: Insufficient documentation

## 2019-01-19 DIAGNOSIS — M5030 Other cervical disc degeneration, unspecified cervical region: Secondary | ICD-10-CM | POA: Diagnosis present

## 2019-01-19 DIAGNOSIS — G8929 Other chronic pain: Secondary | ICD-10-CM

## 2019-01-19 DIAGNOSIS — G894 Chronic pain syndrome: Secondary | ICD-10-CM | POA: Diagnosis present

## 2019-01-19 DIAGNOSIS — M542 Cervicalgia: Secondary | ICD-10-CM | POA: Insufficient documentation

## 2019-01-19 DIAGNOSIS — M5416 Radiculopathy, lumbar region: Secondary | ICD-10-CM

## 2019-01-19 DIAGNOSIS — M5412 Radiculopathy, cervical region: Secondary | ICD-10-CM

## 2019-01-19 DIAGNOSIS — M961 Postlaminectomy syndrome, not elsewhere classified: Secondary | ICD-10-CM | POA: Diagnosis present

## 2019-01-19 DIAGNOSIS — M79602 Pain in left arm: Secondary | ICD-10-CM | POA: Diagnosis present

## 2019-01-19 MED ORDER — OXYCODONE HCL 5 MG PO TABS: 5.0000 mg | ORAL_TABLET | Freq: Four times a day (QID) | ORAL | 0 refills | Status: DC | PRN

## 2019-01-19 MED ORDER — DEXAMETHASONE SODIUM PHOSPHATE 10 MG/ML IJ SOLN
10.0000 mg | Freq: Once | INTRAMUSCULAR | Status: AC
  Administered 2019-01-19: 10 mg
  Filled 2019-01-19: qty 1

## 2019-01-19 MED ORDER — LIDOCAINE HCL 2 % IJ SOLN
20.0000 mL | Freq: Once | INTRAMUSCULAR | Status: AC
  Administered 2019-01-19: 10:00:00 400 mg
  Filled 2019-01-19: qty 20

## 2019-01-19 MED ORDER — IOPAMIDOL (ISOVUE-M 200) INJECTION 41%: 10.0000 mL | Freq: Once | INTRAMUSCULAR | Status: DC

## 2019-01-19 MED ORDER — SODIUM CHLORIDE (PF) 0.9 % IJ SOLN
INTRAMUSCULAR | Status: AC
  Filled 2019-01-19: qty 10

## 2019-01-19 MED ORDER — ROPIVACAINE HCL 2 MG/ML IJ SOLN
1.0000 mL | Freq: Once | INTRAMUSCULAR | Status: AC
  Administered 2019-01-19: 1 mL via EPIDURAL
  Filled 2019-01-19: qty 10

## 2019-01-19 MED ORDER — SODIUM CHLORIDE 0.9% FLUSH
1.0000 mL | Freq: Once | INTRAVENOUS | Status: AC
  Administered 2019-01-19: 1 mL

## 2019-01-19 NOTE — Patient Instructions (Addendum)
____________________________________________________________________________________________  Post-Procedure Discharge Instructions  Instructions:  Apply ice:   Purpose: This will minimize any swelling and discomfort after procedure.   When: Day of procedure, as soon as you get home.  How: Fill a plastic sandwich bag with crushed ice. Cover it with a small towel and apply to injection site.  How long: (15 min on, 15 min off) Apply for 15 minutes then remove x 15 minutes.  Repeat sequence on day of procedure, until you go to bed.  Apply heat:   Purpose: To treat any soreness and discomfort from the procedure.  When: Starting the next day after the procedure.  How: Apply heat to procedure site starting the day following the procedure.  How long: May continue to repeat daily, until discomfort goes away.  Food intake: Start with clear liquids (like water) and advance to regular food, as tolerated.   Physical activities: Keep activities to a minimum for the first 8 hours after the procedure. After that, then as tolerated.  Driving: If you have received any sedation, be responsible and do not drive. You are not allowed to drive for 24 hours after having sedation.  Blood thinner: (Applies only to those taking blood thinners) You may restart your blood thinner 6 hours after your procedure.  Insulin: (Applies only to Diabetic patients taking insulin) As soon as you can eat, you may resume your normal dosing schedule.  Infection prevention: Keep procedure site clean and dry. Shower daily and clean area with soap and water.  Post-procedure Pain Diary: Extremely important that this be done correctly and accurately. Recorded information will be used to determine the next step in treatment. For the purpose of accuracy, follow these rules:  Evaluate only the area treated. Do not report or include pain from an untreated area. For the purpose of this evaluation, ignore all other areas of pain,  except for the treated area.  After your procedure, avoid taking a long nap and attempting to complete the pain diary after you wake up. Instead, set your alarm clock to go off every hour, on the hour, for the initial 8 hours after the procedure. Document the duration of the numbing medicine, and the relief you are getting from it.  Do not go to sleep and attempt to complete it later. It will not be accurate. If you received sedation, it is likely that you were given a medication that may cause amnesia. Because of this, completing the diary at a later time may cause the information to be inaccurate. This information is needed to plan your care.  Follow-up appointment: Keep your post-procedure follow-up evaluation appointment after the procedure (usually 2 weeks for most procedures, 6 weeks for radiofrequencies). DO NOT FORGET to bring you pain diary with you.   Expect: (What should I expect to see with my procedure?)  From numbing medicine (AKA: Local Anesthetics): Numbness or decrease in pain. You may also experience some weakness, which if present, could last for the duration of the local anesthetic.  Onset: Full effect within 15 minutes of injected.  Duration: It will depend on the type of local anesthetic used. On the average, 1 to 8 hours.   From steroids (Applies only if steroids were used): Decrease in swelling or inflammation. Once inflammation is improved, relief of the pain will follow.  Onset of benefits: Depends on the amount of swelling present. The more swelling, the longer it will take for the benefits to be seen. In some cases, up to 10 days.  Duration: Steroids will stay in the system x 2 weeks. Duration of benefits will depend on multiple posibilities including persistent irritating factors.  Side-effects: If present, they may typically last 2 weeks (the duration of the steroids).  Frequent: Cramps (if they occur, drink Gatorade and take over-the-counter Magnesium 450-500 mg  once to twice a day); water retention with temporary weight gain; increases in blood sugar; decreased immune system response; increased appetite.  Occasional: Facial flushing (red, warm cheeks); mood swings; menstrual changes.  Uncommon: Long-term decrease or suppression of natural hormones; bone thinning. (These are more common with higher doses or more frequent use. This is why we prefer that our patients avoid having any injection therapies in other practices.)   Very Rare: Severe mood changes; psychosis; aseptic necrosis.  From procedure: Some discomfort is to be expected once the numbing medicine wears off. This should be minimal if ice and heat are applied as instructed.  Call if: (When should I call?)  You experience numbness and weakness that gets worse with time, as opposed to wearing off.  New onset bowel or bladder incontinence. (Applies only to procedures done in the spine)  Emergency Numbers:  Durning business hours (Monday - Thursday, 8:00 AM - 4:00 PM) (Friday, 9:00 AM - 12:00 Noon): [phone]  After hours: [phone]  NOTE: If you are having a problem and are unable connect with, or to talk to a provider, then go to your nearest urgent care or emergency department. If the problem is serious and urgent, please call 911. ____________________________________________________________________________________________   ____________________________________________________________________________________________  Preparing for your procedure (without sedation)  Procedure appointments are limited to planned procedures: . No Prescription Refills. . No disability issues will be discussed. . No medication changes will be discussed.  Instructions: . Oral Intake: Do not eat or drink anything for at least 3 hours prior to your procedure. . Transportation: Unless otherwise stated by your physician, you may drive yourself after the procedure. . Blood Pressure Medicine:  Take your blood pressure medicine with a sip of water the morning of the procedure. . Blood thinners: Notify our staff if you are taking any blood thinners. Depending on which one you take, there will be specific instructions on how and when to stop it. . Diabetics on insulin: Notify the staff so that you can be scheduled 1st case in the morning. If your diabetes requires high dose insulin, take only  of your normal insulin dose the morning of the procedure and notify the staff that you have done so. . Preventing infections: Shower with an antibacterial soap the morning of your procedure.  . Build-up your immune system: Take 1000 mg of Vitamin C with every meal (3 times a day) the day prior to your procedure. Marland Kitchen Antibiotics: Inform the staff if you have a condition or reason that requires you to take antibiotics before dental procedures. . Pregnancy: If you are pregnant, call and cancel the procedure. . Sickness: If you have a cold, fever, or any active infections, call and cancel the procedure. . Arrival: You must be in the facility at least 30 minutes prior to your scheduled procedure. . Children: Do not bring any children with you. . Dress appropriately: Bring dark clothing that you would not mind if they get stained. . Valuables: Do not bring any jewelry or valuables.  Reasons to call and reschedule or cancel your procedure: (Following these recommendations will minimize the risk of a serious complication.) . Surgeries: Avoid having procedures within 2 weeks  of any surgery. (Avoid for 2 weeks before or after any surgery). . Flu Shots: Avoid having procedures within 2 weeks of a flu shots or . (Avoid for 2 weeks before or after immunizations). . Barium: Avoid having a procedure within 7-10 days after having had a radiological study involving the use of radiological contrast. (Myelograms, Barium swallow or enema study). . Heart attacks: Avoid any elective procedures or surgeries for the initial 6  months after a "Myocardial Infarction" (Heart Attack). . Blood thinners: It is imperative that you stop these medications before procedures. Let us know if you if you take any blood thinner.  . Infection: Avoid procedures during or within two weeks of an infection (including chest colds or gastrointestinal problems). Symptoms associated with infections include: Localized redness, fever, chills, night sweats or profuse sweating, burning sensation when voiding, cough, congestion, stuffiness, runny nose, sore throat, diarrhea, nausea, vomiting, cold or Flu symptoms, recent or current infections. It is specially important if the infection is over the area that we intend to treat. Marland Kitchen Heart and lung problems: Symptoms that may suggest an active cardiopulmonary problem include: cough, chest pain, breathing difficulties or shortness of breath, dizziness, ankle swelling, uncontrolled high or unusually low blood pressure, and/or palpitations. If you are experiencing any of these symptoms, cancel your procedure and contact your primary care physician for an evaluation.  Remember:  Regular Business hours are:  Monday to Thursday 8:00 AM to 4:00 PM  Provider's Schedule: Milinda Pointer, MD:  Procedure days: Tuesday and Thursday 7:30 AM to 4:00 PM  Gillis Santa, MD:  Procedure days: Monday and Wednesday 7:30 AM to 4:00 PM ____________________________________________________________________________________________

## 2019-01-19 NOTE — Progress Notes (Signed)
Safety precautions to be maintained throughout the outpatient stay will include: orient to surroundings, keep bed in low position, maintain call bell within reach at all times, provide assistance with transfer out of bed and ambulation.

## 2019-01-19 NOTE — Progress Notes (Addendum)
Patient's Name: Rick FREUND Sr.  MRN: [id number]  Referring Provider: Center, Sharon  DOB: 21-Mar-1951  PCP: Center, [place]  DOS: 01/19/2019  Note by: Gaspar Cola, MD  Service setting: Ambulatory outpatient  Specialty: Interventional Pain Management  Patient type: Established  Location: [facility] (AMB) Pain Management Facility  Visit type: Interventional Procedure   Primary Reason for Visit: Interventional Pain Management Treatment. CC: Back Pain and Leg Pain  Procedure:          Anesthesia, Analgesia, Anxiolysis:  Type: Palliative, Inter-Laminar, Cervical Epidural Steroid Injection           Region: Posterior Cervico-thoracic Region Level: C7-T1 Laterality: Left-Sided Paramedial  Type: Local Anesthesia Indication(s): Analgesia         Route: Infiltration ([place]/IM) IV Access: Declined Sedation: Declined  Local Anesthetic: Lidocaine 1-2%  Position: Prone with head of the table was raised to facilitate breathing.   Indications: 1. DDD (degenerative disc disease), cervical   2. Cervicalgia   3. Chronic cervical radicular pain (Left)   4. Cervical spondylosis   5. Chronic upper extremity pain (Left)   6. Failed cervical surgery syndrome    Pain Score: Pre-procedure: 7 /10 Post-procedure: 7 /10  Pre-op Assessment:  Rick Carter is a 68 y.o. (year old), male patient, seen today for interventional treatment. He  has a past surgical history that includes Tonsillectomy; Lung removal, partial; and Neck surgery. Rick Carter has a current medication list which includes the following prescription(s): acetylcysteine, aclidinium bromide, advair diskus, albuterol, aspirin ec, calcium carb-cholecalciferol, cholecalciferol, diphenhydramine, fluticasone-salmeterol, guaifenesin, hydrochlorothiazide, ipratropium-albuterol, loratadine, lorazepam, losartan-hydrochlorothiazide, multiple vitamins-minerals, pantoprazole, sildenafil, sodium chloride hypertonic, trazodone, tudorza pressair, ventolin  hfa, docusate sodium, escitalopram, ibuprofen, methocarbamol, oxycodone, oxycodone, oxycodone, oxycodone-acetaminophen, oxycodone-acetaminophen, oxycodone-acetaminophen, and pregabalin, and the following Facility-Administered Medications: iopamidol. His primarily concern today is the Back Pain and Leg Pain  Initial Vital Signs:  Pulse/HCG Rate: 82  Temp: (!) 96.8 F (36 C) Resp: (!) 22 BP: (!) 153/98 SpO2: 99 %  BMI: Estimated body mass index is 18.61 kg/m as calculated from the following:   Height as of this encounter: _0  (1.753 m).   Weight as of this encounter: 126 lb (57.2 kg).  Risk Assessment: Allergies: Reviewed. He is allergic to atorvastatin and varenicline.  Allergy Precautions: None required Coagulopathies: Reviewed. None identified.  Blood-thinner therapy: None at this time Active Infection(s): Reviewed. None identified. Rick Carter is afebrile  Site Confirmation: Rick Carter was asked to confirm the procedure and laterality before marking the site Procedure checklist: Completed Consent: Before the procedure and under the influence of no sedative(s), amnesic(s), or anxiolytics, the patient was informed of the treatment options, risks and possible complications. To fulfill our ethical and legal obligations, as recommended by the American Medical Association's Code of Ethics, I have informed the patient of my clinical impression; the nature and purpose of the treatment or procedure; the risks, benefits, and possible complications of the intervention; the alternatives, including doing nothing; the risk(s) and benefit(s) of the alternative treatment(s) or procedure(s); and the risk(s) and benefit(s) of doing nothing. The patient was provided information about the general risks and possible complications associated with the procedure. These may include, but are not limited to: failure to achieve desired goals, infection, bleeding, organ or nerve damage, allergic reactions, paralysis,  and death. In addition, the patient was informed of those risks and complications associated to Spine-related procedures, such as failure to decrease pain; infection (i.e.: Meningitis, epidural or intraspinal abscess); bleeding (i.e.: epidural hematoma,  subarachnoid hemorrhage, or any other type of intraspinal or peri-dural bleeding); organ or nerve damage (i.e.: Any type of peripheral nerve, nerve root, or spinal cord injury) with subsequent damage to sensory, motor, and/or autonomic systems, resulting in permanent pain, numbness, and/or weakness of one or several areas of the body; allergic reactions; (i.e.: anaphylactic reaction); and/or death. Furthermore, the patient was informed of those risks and complications associated with the medications. These include, but are not limited to: allergic reactions (i.e.: anaphylactic or anaphylactoid reaction(s)); adrenal axis suppression; blood sugar elevation that in diabetics may result in ketoacidosis or comma; water retention that in patients with history of congestive heart failure may result in shortness of breath, pulmonary edema, and decompensation with resultant heart failure; weight gain; swelling or edema; medication-induced neural toxicity; particulate matter embolism and blood vessel occlusion with resultant organ, and/or nervous system infarction; and/or aseptic necrosis of one or more joints. Finally, the patient was informed that Medicine is not an exact science; therefore, there is also the possibility of unforeseen or unpredictable risks and/or possible complications that may result in a catastrophic outcome. The patient indicated having understood very clearly. We have given the patient no guarantees and we have made no promises. Enough time was given to the patient to ask questions, all of which were answered to the patient's satisfaction. Rick Carter has indicated that he wanted to continue with the procedure. Attestation: I, the ordering provider,  attest that I have discussed with the patient the benefits, risks, side-effects, alternatives, likelihood of achieving goals, and potential problems during recovery for the procedure that I have provided informed consent. Date  Time: 01/19/2019  9:03 AM  Pre-Procedure Preparation:  Monitoring: As per clinic protocol. Respiration, ETCO2, SpO2, BP, heart rate and rhythm monitor placed and checked for adequate function Safety Precautions: Patient was assessed for positional comfort and pressure points before starting the procedure. Time-out: I initiated and conducted the "Time-out" before starting the procedure, as per protocol. The patient was asked to participate by confirming the accuracy of the "Time Out" information. Verification of the correct person, site, and procedure were performed and confirmed by me, the nursing staff, and the patient. "Time-out" conducted as per Joint Commission's Universal Protocol (UP.01.01.01). Time: 0940  Description of Procedure:          Target Area: For Epidural Steroid injections the target is the interlaminar space, initially targeting the lower border of the superior vertebral body lamina. Approach: Paramedial approach. Area Prepped: Entire PosteriorCervical Region Prepping solution: ChloraPrep (2% chlorhexidine gluconate and 70% isopropyl alcohol) Safety Precautions: Aspiration looking for blood return was conducted prior to all injections. At no point did we inject any substances, as a needle was being advanced. No attempts were made at seeking any paresthesias. Safe injection practices and needle disposal techniques used. Medications properly checked for expiration dates. SDV (single dose vial) medications used. Description of the Procedure: Protocol guidelines were followed. The procedure needle was introduced through the skin, ipsilateral to the reported pain, and advanced to the target area. Bone was contacted and the needle walked caudad, until the lamina was  cleared. The epidural space was identified using "loss-of-resistance technique" with 2-3 ml of PF-NaCl (0.9% NSS), in a 5cc LOR glass syringe. Vitals:   01/19/19 0902 01/19/19 0939  BP: (!) 153/98 (!) 163/77  Pulse: 82 90  Resp: (!) 22 (!) 22  Temp: (!) 96.8 F (36 C)   SpO2: 99% 98%  Weight: 126 lb (57.2 kg)   Height: 5' 9" (  1.753 m)     Start Time: 0940 hrs. End Time: 0942 hrs. Materials:  Needle(s) Type: Epidural needle Gauge: 17G Length: 3.5-in Medication(s): Please see orders for medications and dosing details.  Imaging Guidance (Spinal):          Type of Imaging Technique: Fluoroscopy Guidance (Spinal) Indication(s): Assistance in needle guidance and placement for procedures requiring needle placement in or near specific anatomical locations not easily accessible without such assistance. Exposure Time: Please see nurses notes. Contrast: Before injecting any contrast, we confirmed that the patient did not have an allergy to iodine, shellfish, or radiological contrast. Once satisfactory needle placement was completed at the desired level, radiological contrast was injected. Contrast injected under live fluoroscopy. No contrast complications. See chart for type and volume of contrast used. Fluoroscopic Guidance: I was personally present during the use of fluoroscopy. "Tunnel Vision Technique" used to obtain the best possible view of the target area. Parallax error corrected before commencing the procedure. "Direction-depth-direction" technique used to introduce the needle under continuous pulsed fluoroscopy. Once target was reached, antero-posterior, oblique, and lateral fluoroscopic projection used confirm needle placement in all planes. Images permanently stored in EMR. Interpretation: I personally interpreted the imaging intraoperatively. Adequate needle placement confirmed in multiple planes. Appropriate spread of contrast into desired area was observed. No evidence of afferent or  efferent intravascular uptake. No intrathecal or subarachnoid spread observed. Permanent images saved into the patient's record.  Antibiotic Prophylaxis:   Anti-infectives (From admission, onward)   None     Indication(s): None identified  Post-operative Assessment:  Post-procedure Vital Signs:  Pulse/HCG Rate: 90  Temp: (!) 96.8 F (36 C) Resp: (!) 22 BP: (!) 163/77 SpO2: 98 %  EBL: None  Complications: No immediate post-treatment complications observed by team, or reported by patient.  Note: The patient tolerated the entire procedure well. A repeat set of vitals were taken after the procedure and the patient was kept under observation following institutional policy, for this type of procedure. Post-procedural neurological assessment was performed, showing return to baseline, prior to discharge. The patient was provided with post-procedure discharge instructions, including a section on how to identify potential problems. Should any problems arise concerning this procedure, the patient was given instructions to immediately contact us, at any time, without hesitation. In any case, we plan to contact the patient by telephone for a follow-up status report regarding this interventional procedure.  Comments:  No additional relevant information.  Plan of Care  Orders:  Orders Placed This Encounter  Procedures  . Cervical Epidural Injection    Procedure: Cervical Epidural Steroid Injection/Block Purpose: Palliative Indication(s): Radiculitis and cervicalgia associater with cervical degenerative disc disease.    Scheduling Instructions:     Level(s): C7-T1     Laterality: Left-sided     Sedation: No Sedation.     Timeframe: Today    Order Specific Question:   Where will this procedure be performed?    Answer:   [facility]    Comments:   by Dr. Dossie Arbour  . Lumbar Epidural Injection    Standing Status:   Future    Standing Expiration Date:   02/18/2019    Scheduling  Instructions:     Procedure: Interlaminar Lumbar Epidural Steroid injection (LESI)  L4-5     Laterality: Right-sided     Sedation: None required.     Timeframe: 2 weeks    Order Specific Question:   Where will this procedure be performed?    Answer:   [facility]  Pain Management  . DG C-Arm 1-60 Min-No Report    Intraoperative interpretation by procedural physician at [place].    Standing Status:   Standing    Number of Occurrences:   1    Order Specific Question:   Reason for exam:    Answer:   Assistance in needle guidance and placement for procedures requiring needle placement in or near specific anatomical locations not easily accessible without such assistance.  . Provider attestation of informed consent for procedure/surgical case    I, the ordering provider, attest that I have discussed with the patient the benefits, risks, side effects, alternatives, likelihood of achieving goals and potential problems during recovery for the procedure that I have provided informed consent.    Standing Status:   Standing    Number of Occurrences:   1  . Informed Consent Details: Transcribe to consent form and obtain patient signature    Standing Status:   Standing    Number of Occurrences:   1    Order Specific Question:   Procedure    Answer:   Cervical epidural steroid injection under fluoroscopic guidance. (See notes for level and laterality.)    Order Specific Question:   Surgeon    Answer:   London Tarnowski A. Dossie Arbour, MD    Order Specific Question:   Indication/Reason    Answer:   Neck pain and/or upper extremity pain secondary to cervical radiculitis/radiculopathy.   Medications ordered for procedure: Meds ordered this encounter  Medications  . iopamidol (ISOVUE-M) 41 % intrathecal injection 10 mL    Must be Myelogram-compatible. If not available, you may substitute with a water-soluble, non-ionic, hypoallergenic, myelogram-compatible radiological contrast medium.  Marland Kitchen lidocaine  (XYLOCAINE) 2 % (with pres) injection 400 mg  . sodium chloride flush (NS) 0.9 % injection 1 mL  . ropivacaine (PF) 2 mg/mL (0.2%) (NAROPIN) injection 1 mL  . dexamethasone (DECADRON) injection 10 mg  . oxyCODONE (OXY IR/ROXICODONE) 5 MG immediate release tablet    Sig: Take 1 tablet (5 mg total) by mouth every 6 (six) hours as needed for up to 30 days for severe pain. Must last 30 days.    Dispense:  120 tablet    Refill:  0    [place] STOP ACT - Not applicable to Chronic Pain Syndrome (G89.4) diagnosis. Fill one day early if pharmacy is closed on scheduled refill date. Do not fill until: 03/20/19. To last until: 04/19/19.  Marland Kitchen oxyCODONE (OXY IR/ROXICODONE) 5 MG immediate release tablet    Sig: Take 1 tablet (5 mg total) by mouth every 6 (six) hours as needed for up to 30 days for severe pain. Must last 30 days.    Dispense:  120 tablet    Refill:  0    [place] STOP ACT - Not applicable to Chronic Pain Syndrome (G89.4) diagnosis. Fill one day early if pharmacy is closed on scheduled refill date. Do not fill until: 02/18/19. To last until: 03/20/19.  Marland Kitchen oxyCODONE (OXY IR/ROXICODONE) 5 MG immediate release tablet    Sig: Take 1 tablet (5 mg total) by mouth every 6 (six) hours as needed for up to 30 days for severe pain. Must last 30 days.    Dispense:  120 tablet    Refill:  0    [place] STOP ACT - Not applicable to Chronic Pain Syndrome (G89.4) diagnosis. Fill one day early if pharmacy is closed on scheduled refill date. Do not fill until: 01/19/19. To last until: 02/18/19.   Medications  administered: We administered lidocaine, sodium chloride flush, ropivacaine (PF) 2 mg/mL (0.2%), and dexamethasone.  See the medical record for exact dosing, route, and time of administration.  Disposition: Discharge home  Discharge Date & Time: 01/19/2019; 0954 hrs.   Follow-up plan:   Return for EPP (2 wks) w/ NP, (Virtual), + Procedure (no sedation): (R) L4-5 LESI.     Future Appointments  Date Time Provider [place]  02/02/2019 11:30 AM Vevelyn Francois, NP [facility] None   Primary Care Physician: [place] Location: [facility] Note by: Gaspar Cola, MD Date: 01/19/2019; Time: 10:00 AM  Disclaimer:  Medicine is not an Chief Strategy Officer. The only guarantee in medicine is that nothing is guaranteed. It is important to note that the decision to proceed with this intervention was based on the information collected from the patient. The Data and conclusions were drawn from the patient's questionnaire, the interview, and the physical examination. Because the information was provided in large part by the patient, it cannot be guaranteed that it has not been purposely or unconsciously manipulated. Every effort has been made to obtain as much relevant data as possible for this evaluation. It is important to note that the conclusions that lead to this procedure are derived in large part from the available data. Always take into account that the treatment will also be dependent on availability of resources and existing treatment guidelines, considered by other Pain Management Practitioners as being common knowledge and practice, at the time of the intervention. For Medico-Legal purposes, it is also important to point out that variation in procedural techniques and pharmacological choices are the acceptable norm. The indications, contraindications, technique, and results of the above procedure should only be interpreted and judged by a Board-Certified Interventional Pain Specialist with extensive familiarity and expertise in the same exact procedure and technique.

## 2019-01-19 NOTE — Addendum Note (Signed)
Addended by: Milinda Pointer A on: 01/19/2019 10:00 AM   Modules accepted: Orders

## 2019-01-20 ENCOUNTER — Telehealth: Payer: Self-pay

## 2019-01-20 DIAGNOSIS — J189 Pneumonia, unspecified organism: Secondary | ICD-10-CM | POA: Insufficient documentation

## 2019-01-20 NOTE — Telephone Encounter (Signed)
Post procedure phone call.  Patient states the shot was fine, but he was not breathing good.  Patient states he is calling 911.

## 2019-01-21 NOTE — Telephone Encounter (Signed)
Attempted to call patient.  No answer.

## 2019-01-23 DIAGNOSIS — R7881 Bacteremia: Secondary | ICD-10-CM | POA: Insufficient documentation

## 2019-01-26 DIAGNOSIS — R531 Weakness: Secondary | ICD-10-CM | POA: Insufficient documentation

## 2019-02-02 ENCOUNTER — Ambulatory Visit: Attending: Nurse Practitioner | Admitting: Nurse Practitioner

## 2019-02-02 ENCOUNTER — Other Ambulatory Visit: Payer: Self-pay

## 2019-02-02 DIAGNOSIS — M5412 Radiculopathy, cervical region: Secondary | ICD-10-CM

## 2019-02-02 DIAGNOSIS — M792 Neuralgia and neuritis, unspecified: Secondary | ICD-10-CM

## 2019-02-02 DIAGNOSIS — M7918 Myalgia, other site: Secondary | ICD-10-CM | POA: Diagnosis not present

## 2019-02-02 DIAGNOSIS — G8929 Other chronic pain: Secondary | ICD-10-CM

## 2019-02-02 DIAGNOSIS — M5416 Radiculopathy, lumbar region: Secondary | ICD-10-CM

## 2019-02-02 MED ORDER — ENOXAPARIN SODIUM 40 MG/0.4ML ~~LOC~~ SOLN
40.00 | SUBCUTANEOUS | Status: DC
Start: 2019-01-31 — End: 2019-02-02

## 2019-02-02 MED ORDER — ALBUTEROL SULFATE HFA 108 (90 BASE) MCG/ACT IN AERS
2.00 | INHALATION_SPRAY | RESPIRATORY_TRACT | Status: DC
Start: ? — End: 2019-02-02

## 2019-02-02 MED ORDER — GENERIC EXTERNAL MEDICATION
Status: DC
Start: ? — End: 2019-02-02

## 2019-02-02 MED ORDER — CALCIUM-VITAMIN D
600.00 | Status: DC
Start: 2019-01-31 — End: 2019-02-02

## 2019-02-02 MED ORDER — SENNOSIDES 8.6 MG PO TABS
2.00 | ORAL_TABLET | ORAL | Status: DC
Start: ? — End: 2019-02-02

## 2019-02-02 MED ORDER — UMECLIDINIUM BROMIDE 62.5 MCG/INH IN AEPB
1.00 | INHALATION_SPRAY | RESPIRATORY_TRACT | Status: DC
Start: 2019-02-01 — End: 2019-02-02

## 2019-02-02 MED ORDER — CYCLOBENZAPRINE HCL 10 MG PO TABS
10.00 | ORAL_TABLET | ORAL | Status: DC
Start: ? — End: 2019-02-02

## 2019-02-02 MED ORDER — LORATADINE 10 MG PO TABS
10.00 | ORAL_TABLET | ORAL | Status: DC
Start: 2019-02-01 — End: 2019-02-02

## 2019-02-02 MED ORDER — OXYCODONE-ACETAMINOPHEN 5-325 MG PO TABS
1.00 | ORAL_TABLET | ORAL | Status: DC
Start: ? — End: 2019-02-02

## 2019-02-02 MED ORDER — FLUOXETINE HCL 10 MG PO CAPS
10.00 | ORAL_CAPSULE | ORAL | Status: DC
Start: 2019-02-01 — End: 2019-02-02

## 2019-02-02 MED ORDER — ACETAMINOPHEN 325 MG PO TABS
650.00 | ORAL_TABLET | ORAL | Status: DC
Start: ? — End: 2019-02-02

## 2019-02-02 MED ORDER — HYDROXYZINE HCL 25 MG PO TABS
25.00 | ORAL_TABLET | ORAL | Status: DC
Start: ? — End: 2019-02-02

## 2019-02-02 MED ORDER — GENERIC EXTERNAL MEDICATION
400.00 | Status: DC
Start: 2019-01-31 — End: 2019-02-02

## 2019-02-02 MED ORDER — PREGABALIN 75 MG PO CAPS
150.00 | ORAL_CAPSULE | ORAL | Status: DC
Start: 2019-01-31 — End: 2019-02-02

## 2019-02-02 MED ORDER — NICOTINE 21 MG/24HR TD PT24
1.00 | MEDICATED_PATCH | TRANSDERMAL | Status: DC
Start: 2019-02-01 — End: 2019-02-02

## 2019-02-02 MED ORDER — ASPIRIN EC 81 MG PO TBEC
81.00 | DELAYED_RELEASE_TABLET | ORAL | Status: DC
Start: 2019-02-01 — End: 2019-02-02

## 2019-02-02 MED ORDER — ALBUTEROL SULFATE HFA 108 (90 BASE) MCG/ACT IN AERS
2.00 | INHALATION_SPRAY | RESPIRATORY_TRACT | Status: DC
Start: 2019-01-31 — End: 2019-02-02

## 2019-02-02 MED ORDER — POLYETHYLENE GLYCOL 3350 17 G PO PACK
17.00 | PACK | ORAL | Status: DC
Start: ? — End: 2019-02-02

## 2019-02-02 MED ORDER — PREGABALIN 150 MG PO CAPS: 150.0000 mg | ORAL_CAPSULE | Freq: Two times a day (BID) | ORAL | 0 refills | Status: DC

## 2019-02-02 MED ORDER — MONTELUKAST SODIUM 10 MG PO TABS
10.00 | ORAL_TABLET | ORAL | Status: DC
Start: 2019-01-31 — End: 2019-02-02

## 2019-02-02 MED ORDER — DOCUSATE SODIUM 100 MG PO CAPS
100.00 | ORAL_CAPSULE | ORAL | Status: DC
Start: 2019-02-01 — End: 2019-02-02

## 2019-02-02 MED ORDER — LORAZEPAM 1 MG PO TABS
1.00 | ORAL_TABLET | ORAL | Status: DC
Start: ? — End: 2019-02-02

## 2019-02-02 MED ORDER — FLUTICASONE PROPIONATE 50 MCG/ACT NA SUSP
1.00 | NASAL | Status: DC
Start: 2019-02-01 — End: 2019-02-02

## 2019-02-02 MED ORDER — FLUTICASONE FUROATE-VILANTEROL 200-25 MCG/INH IN AEPB
1.00 | INHALATION_SPRAY | RESPIRATORY_TRACT | Status: DC
Start: 2019-02-01 — End: 2019-02-02

## 2019-02-02 NOTE — Patient Instructions (Addendum)
____________________________________________________________________________________________  Medication Rules  Purpose: To inform patients, and their family members, of our rules and regulations.  Applies to: All patients receiving prescriptions (written or electronic).  Pharmacy of record: Pharmacy where electronic prescriptions will be sent. If written prescriptions are taken to a different pharmacy, please inform the nursing staff. The pharmacy listed in the electronic medical record should be the one where you would like electronic prescriptions to be sent.  Electronic prescriptions: In compliance with the [place] (STOP) Act of 2017 (Session Lanny Cramp [phone]), effective October 14, 2018, all controlled substances must be electronically prescribed. Calling prescriptions to the pharmacy will cease to exist.  Prescription refills: Only during scheduled appointments. Applies to all prescriptions.  NOTE: The following applies primarily to controlled substances (Opioid* Pain Medications).   Patient's responsibilities: 1. Pain Pills: Bring all pain pills to every appointment (except for procedure appointments). 2. Pill Bottles: Bring pills in original pharmacy bottle. Always bring the newest bottle. Bring bottle, even if empty. 3. Medication refills: You are responsible for knowing and keeping track of what medications you take and those you need refilled. The day before your appointment: write a list of all prescriptions that need to be refilled. The day of the appointment: give the list to the admitting nurse. Prescriptions will be written only during appointments. No prescriptions will be written on procedure days. If you forget a medication: it will not be "Called in", "Faxed", or "electronically sent". You will need to get another appointment to get these prescribed. No early refills. Do not call asking to have your prescription filled  early. 4. Prescription Accuracy: You are responsible for carefully inspecting your prescriptions before leaving our office. Have the discharge nurse carefully go over each prescription with you, before taking them home. Make sure that your name is accurately spelled, that your address is correct. Check the name and dose of your medication to make sure it is accurate. Check the number of pills, and the written instructions to make sure they are clear and accurate. Make sure that you are given enough medication to last until your next medication refill appointment. 5. Taking Medication: Take medication as prescribed. When it comes to controlled substances, taking less pills or less frequently than prescribed is permitted and encouraged. Never take more pills than instructed. Never take medication more frequently than prescribed.  6. Inform other Doctors: Always inform, all of your healthcare providers, of all the medications you take. 7. Pain Medication from other Providers: You are not allowed to accept any additional pain medication from any other Doctor or Healthcare provider. There are two exceptions to this rule. (see below) In the event that you require additional pain medication, you are responsible for notifying us, as stated below. 8. Medication Agreement: You are responsible for carefully reading and following our Medication Agreement. This must be signed before receiving any prescriptions from our practice. Safely store a copy of your signed Agreement. Violations to the Agreement will result in no further prescriptions. (Additional copies of our Medication Agreement are available upon request.) 9. Laws, Rules, & Regulations: All patients are expected to follow all Federal and [employer], Rules, [employer]. Ignorance of the Laws does not constitute a valid excuse. The use of any illegal substances is prohibited. 10. Adopted CDC guidelines & recommendations: Target dosing levels will be  at or below 60 MME/day. Use of benzodiazepines** is not recommended.  Exceptions: There are only two exceptions to the rule of not  receiving pain medications from other Healthcare Providers. 1. Exception #1 (Emergencies): In the event of an emergency (i.e.: accident requiring emergency care), you are allowed to receive additional pain medication. However, you are responsible for: As soon as you are able, call our office [phone], at any time of the day or night, and leave a message stating your name, the date and nature of the emergency, and the name and dose of the medication prescribed. In the event that your call is answered by a member of our staff, make sure to document and save the date, time, and the name of the person that took your information.  2. Exception #2 (Planned Surgery): In the event that you are scheduled by another doctor or dentist to have any type of surgery or procedure, you are allowed (for a period no longer than 30 days), to receive additional pain medication, for the acute post-op pain. However, in this case, you are responsible for picking up a copy of our "Post-op Pain Management for Surgeons" handout, and giving it to your surgeon or dentist. This document is available at our office, and does not require an appointment to obtain it. Simply go to our office during business hours (Monday-Thursday from 8:00 AM to 4:00 PM) (Friday 8:00 AM to 12:00 Noon) or if you have a scheduled appointment with [place], prior to your surgery, and ask for it by name. In addition, you will need to provide [place] with your name, name of your surgeon, type of surgery, and date of procedure or surgery.  *Opioid medications include: morphine, codeine, oxycodone, oxymorphone, hydrocodone, hydromorphone, meperidine, tramadol, tapentadol, buprenorphine, fentanyl, methadone. **Benzodiazepine medications include: diazepam (Valium), alprazolam (Xanax), clonazepam (Klonopine), lorazepam (Ativan), clorazepate  (Tranxene), chlordiazepoxide (Librium), estazolam (Prosom), oxazepam (Serax), temazepam (Restoril), triazolam (Halcion) (Last updated: 12/11/2017) ____________________________________________________________________________________________

## 2019-02-02 NOTE — Progress Notes (Signed)
Pain Management Virtual Encounter Note - Virtual Visit via Telephone Telehealth (real-time audio visits between healthcare provider and patient).  Patient's Phone No. & Preferred Pharmacy:  [phone] (home); [phone] (mobile); (Preferred) [phone] No e-mail address on record  [id number] [place] [address]Phone: [phone]: [phone]  Pre-screening note:  Our staff contacted Mr. CBautchand offered him an "in person", "face-to-face" appointment versus a telephone encounter. He indicated preferring the telephone encounter, at this time.  Reason for Virtual Visit: COVID-19*  Social distancing based on CDC and AMA recommendations.   I contacted JKWALI WRINKLESr. on 02/02/2019 at 10.41 AM via telephone and clearly identified myself as CDionisio David NP. I verified that I was speaking with the correct person using two identifiers (Name and date of birth: [birth year].  Advanced Informed Consent I sought verbal advanced consent from [place]for virtual visit interactions. I informed Mr. CPalosof possible security and privacy concerns, risks, and limitations associated with providing "not-in-person" medical evaluation and management services. I also informed Mr. CBrogdenof the availability of "in-person" appointments. Finally, I informed him that there would be a charge for the virtual visit and that he could be  personally, fully or partially, financially responsible for it. Mr. CTreiberexpressed understanding and agreed to proceed.   Historic Elements   Mr. JCALLAGHAN LAVERDURESr. is a 68y.o. year old, male patient evaluated today after his last encounter by our practice on 01/20/2019. Mr. CBodkin has a past medical history of Anxiety, Bronchitis, COPD (chronic obstructive pulmonary disease) ([place], Depression, Hernia of abdominal wall, History of alcoholism ([place] (08/08/2015), History of pneumonectomy  (08/08/2015), Hypertension, Macular degeneration (08/26/2016), and Tumor cells. He also  has a past surgical history that includes Tonsillectomy; Lung removal, partial; and Neck surgery. Mr. CZeringue_0 @ He  reports that he has been smoking cigarettes. He has a 51.00 pack-year smoking history. He has never used smokeless tobacco. He reports that he does not drink alcohol. No history on file for drug. Mr. CSiemonis allergic to atorvastatin and varenicline.   HPI  I last saw him on 09/22/2018. He is being evaluated for both, medication management and a post-procedure assessment. He has 4/10 lower back pain. He has right leg pain that goes into the top of his foot. He has tingling. He is SP Left cervical ESI. He admits that his neck pain has resolved but now he has back pain. He is under increased stress today because the day after his procedure; he was admitted to the hospital for PNX. He was there for several days. While he was there is significant other was also hospitalized and she is not doing well. He was told to remain in home for 14 days . He is under increase anxiety because of all the resent events . His sister is helping to care for him. He did have a change in his oxycodone/APAP 5/323mto Oxycodone 45m51m. He does not feel like the Oxycodone 45mg57m as effective. He admits that he was to have an injection in his lower back.  Post-Procedure Evaluation  Procedure: Left-sided Cervical Epidural Steriod injection Pre-procedure pain level:  7/10 Post-procedure: 7/10          Sedation: Please see nurses note.  Effectiveness during initial hour after procedure(Ultra-Short Term Relief): 50 %  Local anesthetic used: Long-acting (4-6 hours) Effectiveness: Defined as any analgesic benefit  obtained secondary to the administration of local anesthetics. This carries significant diagnostic value as to the etiological location, or anatomical origin, of the pain. Duration of benefit is expected to coincide with  the duration of the local anesthetic used.  Effectiveness during initial 4-6 hours after procedure(Short-Term Relief): 50%  Long-term benefit: Defined as any relief past the pharmacologic duration of the local anesthetics.  Effectiveness past the initial 6 hours after procedure(Long-Term Relief): 50%  Current benefits: Defined as benefit that persist at this time.   Analgesia:  50% improved Function: Mr. Gruenwald reports improvement in function ROM: Mr. Kardell reports improvement in ROM  Pharmacotherapy Assessment  Analgesic:Oxycodone/APAP 5/325 one every 6 hours (20 mg/day) MME/day:30 mg/day   Monitoring: Pharmacotherapy: No side-effects or adverse reactions reported. [place] PMP: _0 @       Compliance: No problems identified or detected. Plan: Refer to "POC".  Review of recent tests  DG C-Arm 1-60 Min-No Report Fluoroscopy was utilized by the requesting physician.  No radiographic  interpretation.    Orders Only on 04/06/2018  Component Date Value Ref Range Status  . Glucose 04/06/2018 121* 65 - 99 mg/dL Final  . BUN 04/06/2018 9  8 - 27 mg/dL Final  . Creatinine, Ser 04/06/2018 0.84  0.76 - 1.27 mg/dL Final  . GFR calc non Af Amer 04/06/2018 90  >59 mL/min/1.73 Final  . GFR calc Af Amer 04/06/2018 104  >59 mL/min/1.73 Final  . BUN/Creatinine Ratio 04/06/2018 11  10 - 24 Final  . Sodium 04/06/2018 144  134 - 144 mmol/L Final  . Potassium 04/06/2018 4.2  3.5 - 5.2 mmol/L Final  . Chloride 04/06/2018 102  96 - 106 mmol/L Final  . Calcium 04/06/2018 9.8  8.6 - 10.2 mg/dL Final  . Total Protein 04/06/2018 6.8  6.0 - 8.5 g/dL Final  . Albumin 04/06/2018 4.6  3.6 - 4.8 g/dL Final  . Globulin, Total 04/06/2018 2.2  1.5 - 4.5 g/dL Final  . Albumin/Globulin Ratio 04/06/2018 2.1  1.2 - 2.2 Final  . Bilirubin Total 04/06/2018 0.3  0.0 - 1.2 mg/dL Final  . Alkaline Phosphatase 04/06/2018 64  39 - 117 IU/L Final  . AST 04/06/2018 18  0 - 40 IU/L Final  . 25-Hydroxy, Vitamin D  04/06/2018 76  ng/mL Final   Comment: Reference Range: All Ages: Target levels 30 - 100   . 25-Hydroxy, Vitamin D-2 04/06/2018 <1.0  ng/mL Final  . 25-Hydroxy, Vitamin D-3 04/06/2018 76  ng/mL Final  . Sed Rate 04/06/2018 4  0 - 30 mm/hr Final  . Vitamin B-12 04/06/2018 395  232 - 1,245 pg/mL Final  . Magnesium 04/06/2018 2.1  1.6 - 2.3 mg/dL Final  . CRP 04/06/2018 2  0 - 10 mg/L Final                 **Please note reference interval change**   Assessment  Diagnoses of Musculoskeletal pain, Neurogenic pain, and Neuropathic pain were pertinent to this visit.  Plan of Care  _1 @  Pharmacotherapy (Medications Ordered): Meds ordered this encounter  Medications  . pregabalin (LYRICA) 150 MG capsule    Sig: Take 1 capsule (150 mg total) by mouth 2 (two) times daily.    Dispense:  180 capsule    Refill:  0    Do not place this medication, or any other prescription from our practice, on "Automatic Refill". Patient may have prescription filled one day early if pharmacy is closed on scheduled refill date.    Order  Specific Question:   Supervising Provider    Answer:   Milinda Pointer [phone]   Orders:  No orders of the defined types were placed in this encounter.  Follow-up plan:   No follow-ups on file.   I discussed the assessment and treatment plan with the patient. The patient was provided an opportunity to ask questions and all were answered. The patient agreed with the plan and demonstrated an understanding of the instructions.  Patient advised to call back or seek an in-person evaluation if the symptoms or condition worsens.  Total duration of non-face-to-face encounter: 20 minutes.  Note by: Dionisio David, NP Date: 02/02/2019; Time: 11:45 AM  Disclaimer:  * Given the special circumstances of the COVID-19 pandemic, the federal government has announced that the Office for Civil Rights (OCR) will exercise its enforcement discretion and will not impose penalties on  physicians using telehealth in the event of noncompliance with regulatory requirements under the [place] and [place] (HIPAA) in connection with the good faith provision of telehealth during the ZBCAF-68 national public health emergency. ([place])

## 2019-03-15 ENCOUNTER — Other Ambulatory Visit: Payer: Self-pay

## 2019-03-15 ENCOUNTER — Telehealth: Payer: Self-pay

## 2019-03-15 DIAGNOSIS — Z01818 Encounter for other preprocedural examination: Secondary | ICD-10-CM

## 2019-03-15 NOTE — Telephone Encounter (Signed)
Please advise.

## 2019-03-15 NOTE — Telephone Encounter (Signed)
The nurse with home health called to schedule a procedure for Mr. Mena. I got him in next Tuesday. She wants to know if he can increase his pain meds until the procedure, he states he is in a lot of pain. Please call her back at [phone] Her name is Gerald Stabs

## 2019-03-17 ENCOUNTER — Other Ambulatory Visit: Payer: Self-pay

## 2019-03-17 ENCOUNTER — Other Ambulatory Visit: Payer: Self-pay | Admitting: *Deleted

## 2019-03-17 MED ORDER — METHYLPREDNISOLONE 4 MG PO TBPK: ORAL_TABLET | ORAL | 0 refills | Status: AC

## 2019-03-17 NOTE — Telephone Encounter (Signed)
Spoke with Vania Rea, instructed to increase Lyrica to tid. Also will send script for Medrol Dosepack to pharmacy.

## 2019-03-17 NOTE — Telephone Encounter (Signed)
Call the patient and increase the Lyrica to 3 times a day.  In addition, ask him if they would like a Medrol Dosepak.  If they indicate that they are interested in it, then go ahead and enter the order, as a verbal order.

## 2019-03-18 ENCOUNTER — Other Ambulatory Visit: Payer: Self-pay

## 2019-03-18 ENCOUNTER — Other Ambulatory Visit
Admission: RE | Admit: 2019-03-18 | Discharge: 2019-03-18 | Disposition: A | Discharge status: Home / Self Care | Payer: Medicare Other | Source: Ambulatory Visit | Attending: Pain Medicine | Admitting: Pain Medicine

## 2019-03-18 DIAGNOSIS — Z01818 Encounter for other preprocedural examination: Secondary | ICD-10-CM

## 2019-03-18 DIAGNOSIS — Z1159 Encounter for screening for other viral diseases: Secondary | ICD-10-CM | POA: Insufficient documentation

## 2019-03-19 LAB — NOVEL CORONAVIRUS, NAA (HOSP ORDER, SEND-OUT TO REF LAB; TAT 18-24 HRS): SARS-CoV-2, NAA: NOT DETECTED

## 2019-03-23 ENCOUNTER — Encounter: Payer: Self-pay | Admitting: Pain Medicine

## 2019-03-23 ENCOUNTER — Ambulatory Visit
Admission: RE | Admit: 2019-03-23 | Discharge: 2019-03-23 | Disposition: A | Discharge status: Home / Self Care | Payer: Medicare Other | Source: Ambulatory Visit | Attending: Pain Medicine | Admitting: Pain Medicine

## 2019-03-23 ENCOUNTER — Ambulatory Visit (HOSPITAL_BASED_OUTPATIENT_CLINIC_OR_DEPARTMENT_OTHER): Payer: Medicare Other | Admitting: Pain Medicine

## 2019-03-23 ENCOUNTER — Other Ambulatory Visit: Payer: Self-pay

## 2019-03-23 VITALS — BP 147/80 | HR 73 | Temp 98.1°F | Resp 17 | Ht 69.0 in | Wt 116.0 lb

## 2019-03-23 DIAGNOSIS — M5126 Other intervertebral disc displacement, lumbar region: Secondary | ICD-10-CM | POA: Diagnosis present

## 2019-03-23 DIAGNOSIS — M79604 Pain in right leg: Secondary | ICD-10-CM | POA: Diagnosis present

## 2019-03-23 DIAGNOSIS — M5416 Radiculopathy, lumbar region: Secondary | ICD-10-CM | POA: Diagnosis present

## 2019-03-23 DIAGNOSIS — G8929 Other chronic pain: Secondary | ICD-10-CM | POA: Diagnosis present

## 2019-03-23 DIAGNOSIS — M48061 Spinal stenosis, lumbar region without neurogenic claudication: Secondary | ICD-10-CM

## 2019-03-23 DIAGNOSIS — M961 Postlaminectomy syndrome, not elsewhere classified: Secondary | ICD-10-CM | POA: Diagnosis present

## 2019-03-23 DIAGNOSIS — M5136 Other intervertebral disc degeneration, lumbar region: Secondary | ICD-10-CM | POA: Diagnosis not present

## 2019-03-23 DIAGNOSIS — M5441 Lumbago with sciatica, right side: Secondary | ICD-10-CM

## 2019-03-23 MED ORDER — TRIAMCINOLONE ACETONIDE 40 MG/ML IJ SUSP
40.0000 mg | Freq: Once | INTRAMUSCULAR | Status: AC
  Administered 2019-03-23: 40 mg
  Filled 2019-03-23: qty 1

## 2019-03-23 MED ORDER — MIDAZOLAM HCL 5 MG/5ML IJ SOLN
1.0000 mg | INTRAMUSCULAR | Status: DC | PRN
  Administered 2019-03-23: 2 mg via INTRAVENOUS
  Filled 2019-03-23: qty 5

## 2019-03-23 MED ORDER — LIDOCAINE HCL 2 % IJ SOLN
20.0000 mL | Freq: Once | INTRAMUSCULAR | Status: AC
  Administered 2019-03-23: 11:00:00 400 mg
  Filled 2019-03-23: qty 20

## 2019-03-23 MED ORDER — LACTATED RINGERS IV SOLN
1000.0000 mL | Freq: Once | INTRAVENOUS | Status: AC
  Administered 2019-03-23: 1000 mL via INTRAVENOUS

## 2019-03-23 MED ORDER — ROPIVACAINE HCL 2 MG/ML IJ SOLN
2.0000 mL | Freq: Once | INTRAMUSCULAR | Status: AC
  Administered 2019-03-23: 10 mL via EPIDURAL
  Filled 2019-03-23: qty 10

## 2019-03-23 MED ORDER — SODIUM CHLORIDE 0.9% FLUSH
2.0000 mL | Freq: Once | INTRAVENOUS | Status: AC
  Administered 2019-03-23: 10 mL

## 2019-03-23 MED ORDER — IOHEXOL 180 MG/ML  SOLN
10.0000 mL | Freq: Once | INTRAMUSCULAR | Status: AC
  Administered 2019-03-23: 10 mL via EPIDURAL

## 2019-03-23 MED ORDER — SODIUM CHLORIDE (PF) 0.9 % IJ SOLN
INTRAMUSCULAR | Status: AC
  Filled 2019-03-23: qty 10

## 2019-03-23 NOTE — Patient Instructions (Signed)
____________________________________________________________________________________________  Post-Procedure Discharge Instructions  Instructions:  Apply ice:   Purpose: This will minimize any swelling and discomfort after procedure.   When: Day of procedure, as soon as you get home.  How: Fill a plastic sandwich bag with crushed ice. Cover it with a small towel and apply to injection site.  How long: (15 min on, 15 min off) Apply for 15 minutes then remove x 15 minutes.  Repeat sequence on day of procedure, until you go to bed.  Apply heat:   Purpose: To treat any soreness and discomfort from the procedure.  When: Starting the next day after the procedure.  How: Apply heat to procedure site starting the day following the procedure.  How long: May continue to repeat daily, until discomfort goes away.  Food intake: Start with clear liquids (like water) and advance to regular food, as tolerated.   Physical activities: Keep activities to a minimum for the first 8 hours after the procedure. After that, then as tolerated.  Driving: If you have received any sedation, be responsible and do not drive. You are not allowed to drive for 24 hours after having sedation.  Blood thinner: (Applies only to those taking blood thinners) You may restart your blood thinner 6 hours after your procedure.  Insulin: (Applies only to Diabetic patients taking insulin) As soon as you can eat, you may resume your normal dosing schedule.  Infection prevention: Keep procedure site clean and dry. Shower daily and clean area with soap and water.  Post-procedure Pain Diary: Extremely important that this be done correctly and accurately. Recorded information will be used to determine the next step in treatment. For the purpose of accuracy, follow these rules:  Evaluate only the area treated. Do not report or include pain from an untreated area. For the purpose of this evaluation, ignore all other areas of pain,  except for the treated area.  After your procedure, avoid taking a long nap and attempting to complete the pain diary after you wake up. Instead, set your alarm clock to go off every hour, on the hour, for the initial 8 hours after the procedure. Document the duration of the numbing medicine, and the relief you are getting from it.  Do not go to sleep and attempt to complete it later. It will not be accurate. If you received sedation, it is likely that you were given a medication that may cause amnesia. Because of this, completing the diary at a later time may cause the information to be inaccurate. This information is needed to plan your care.  Follow-up appointment: Keep your post-procedure follow-up evaluation appointment after the procedure (usually 2 weeks for most procedures, 6 weeks for radiofrequencies). DO NOT FORGET to bring you pain diary with you.   Expect: (What should I expect to see with my procedure?)  From numbing medicine (AKA: Local Anesthetics): Numbness or decrease in pain. You may also experience some weakness, which if present, could last for the duration of the local anesthetic.  Onset: Full effect within 15 minutes of injected.  Duration: It will depend on the type of local anesthetic used. On the average, 1 to 8 hours.   From steroids (Applies only if steroids were used): Decrease in swelling or inflammation. Once inflammation is improved, relief of the pain will follow.  Onset of benefits: Depends on the amount of swelling present. The more swelling, the longer it will take for the benefits to be seen. In some cases, up to 10 days.  Duration: Steroids will stay in the system x 2 weeks. Duration of benefits will depend on multiple posibilities including persistent irritating factors.  Side-effects: If present, they may typically last 2 weeks (the duration of the steroids).  Frequent: Cramps (if they occur, drink Gatorade and take over-the-counter Magnesium 450-500 mg  once to twice a day); water retention with temporary weight gain; increases in blood sugar; decreased immune system response; increased appetite.  Occasional: Facial flushing (red, warm cheeks); mood swings; menstrual changes.  Uncommon: Long-term decrease or suppression of natural hormones; bone thinning. (These are more common with higher doses or more frequent use. This is why we prefer that our patients avoid having any injection therapies in other practices.)   Very Rare: Severe mood changes; psychosis; aseptic necrosis.  From procedure: Some discomfort is to be expected once the numbing medicine wears off. This should be minimal if ice and heat are applied as instructed.  Call if: (When should I call?)  You experience numbness and weakness that gets worse with time, as opposed to wearing off.  New onset bowel or bladder incontinence. (Applies only to procedures done in the spine)  Emergency Numbers:  Durning business hours (Monday - Thursday, 8:00 AM - 4:00 PM) (Friday, 9:00 AM - 12:00 Noon): [phone]  After hours: [phone]  NOTE: If you are having a problem and are unable connect with, or to talk to a provider, then go to your nearest urgent care or emergency department. If the problem is serious and urgent, please call 911. ____________________________________________________________________________________________

## 2019-03-23 NOTE — Progress Notes (Signed)
Safety precautions to be maintained throughout the outpatient stay will include: orient to surroundings, keep bed in low position, maintain call bell within reach at all times, provide assistance with transfer out of bed and ambulation.

## 2019-03-23 NOTE — Progress Notes (Signed)
Patient's Name: Rick HOLLIMAN Sr.  MRN: [id number]  Referring Provider: Milinda Pointer, MD  DOB: July 30, 1951  PCP: [facility] [place]  DOS: 03/23/2019  Note by: Gaspar Cola, MD  Service setting: Ambulatory outpatient  Specialty: Interventional Pain Management  Patient type: Established  Location: [facility] (AMB) Pain Management Facility  Visit type: Interventional Procedure   Primary Reason for Visit: Interventional Pain Management Treatment. CC: Back Pain (low)  Procedure:          Anesthesia, Analgesia, Anxiolysis:  Type: Palliative Inter-Laminar Epidural Steroid Injection           Region: Lumbar Level: L4-5 Level. Laterality: Right-Sided Paramedial  Type: Moderate (Conscious) Sedation combined with Local Anesthesia Indication(s): Analgesia and Anxiety Route: Intravenous (IV) IV Access: Secured Sedation: Meaningful verbal contact was maintained at all times during the procedure  Local Anesthetic: Lidocaine 1-2%  Position: Prone with head of the table was raised to facilitate breathing.   Indications: 1. DDD (degenerative disc disease), lumbar   2. Chronic low back pain (Primary Area of Pain) (midline)   3. Chronic lower extremity pain (Secondary Area of Pain) (Right)   4. Chronic lumbar radicular pain (L5/S1 dermatome) (Right)   5. Failed back surgical syndrome   6. 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 Lumbar foraminal stenosis (L5-S1) (Right)    Pain Score: Pre-procedure: 8 /10 Post-procedure: 3 /10  Pre-op Assessment:  Rick Carter is a 68 y.o. (year old), male patient, seen today for interventional treatment. He  has a past surgical history that includes Tonsillectomy; Lung removal, partial; and Neck surgery. Rick Carter has a current medication list which includes the following prescription(s): acetylcysteine, aclidinium bromide, advair diskus, albuterol, aspirin ec, calcium carb-cholecalciferol, cholecalciferol, diphenhydramine, escitalopram, fluoxetine,  fluticasone-salmeterol, guaifenesin, hydrochlorothiazide, hydroxyzine, ipratropium-albuterol, levofloxacin, loratadine, lorazepam, losartan-hydrochlorothiazide, montelukast, multiple vitamins-minerals, oxycodone, pantoprazole, polyethylene glycol, pregabalin, sildenafil, sodium chloride hypertonic, trazodone, tudorza pressair, ventolin hfa, docusate sodium, ibuprofen, methocarbamol, methylprednisolone, oxycodone, oxycodone, oxycodone-acetaminophen, oxycodone-acetaminophen, and oxycodone-acetaminophen, and the following Facility-Administered Medications: lactated ringers and midazolam. His primarily concern today is the Back Pain (low)  Initial Vital Signs:  Pulse/HCG Rate: 73ECG Heart Rate: 77 Temp: 98.1 F (36.7 C) Resp: (!) 22 BP: (!) 162/62 SpO2: 100 %  BMI: Estimated body mass index is 17.13 kg/m as calculated from the following:   Height as of this encounter: _0  (1.753 m).   Weight as of this encounter: 116 lb (52.6 kg).  Risk Assessment: Allergies: Reviewed. He is allergic to atorvastatin and varenicline.  Allergy Precautions: None required Coagulopathies: Reviewed. None identified.  Blood-thinner therapy: None at this time Active Infection(s): Reviewed. None identified. Rick Carter is afebrile  Site Confirmation: Rick Carter was asked to confirm the procedure and laterality before marking the site Procedure checklist: Completed Consent: Before the procedure and under the influence of no sedative(s), amnesic(s), or anxiolytics, the patient was informed of the treatment options, risks and possible complications. To fulfill our ethical and legal obligations, as recommended by the American Medical Association's Code of Ethics, I have informed the patient of my clinical impression; the nature and purpose of the treatment or procedure; the risks, benefits, and possible complications of the intervention; the alternatives, including doing nothing; the risk(s) and benefit(s) of the alternative  treatment(s) or procedure(s); and the risk(s) and benefit(s) of doing nothing. The patient was provided information about the general risks and possible complications associated with the procedure. These may include, but are not limited to: failure to achieve desired goals, infection, bleeding, organ or  nerve damage, allergic reactions, paralysis, and death. In addition, the patient was informed of those risks and complications associated to Spine-related procedures, such as failure to decrease pain; infection (i.e.: Meningitis, epidural or intraspinal abscess); bleeding (i.e.: epidural hematoma, subarachnoid hemorrhage, or any other type of intraspinal or peri-dural bleeding); organ or nerve damage (i.e.: Any type of peripheral nerve, nerve root, or spinal cord injury) with subsequent damage to sensory, motor, and/or autonomic systems, resulting in permanent pain, numbness, and/or weakness of one or several areas of the body; allergic reactions; (i.e.: anaphylactic reaction); and/or death. Furthermore, the patient was informed of those risks and complications associated with the medications. These include, but are not limited to: allergic reactions (i.e.: anaphylactic or anaphylactoid reaction(s)); adrenal axis suppression; blood sugar elevation that in diabetics may result in ketoacidosis or comma; water retention that in patients with history of congestive heart failure may result in shortness of breath, pulmonary edema, and decompensation with resultant heart failure; weight gain; swelling or edema; medication-induced neural toxicity; particulate matter embolism and blood vessel occlusion with resultant organ, and/or nervous system infarction; and/or aseptic necrosis of one or more joints. Finally, the patient was informed that Medicine is not an exact science; therefore, there is also the possibility of unforeseen or unpredictable risks and/or possible complications that may result in a catastrophic  outcome. The patient indicated having understood very clearly. We have given the patient no guarantees and we have made no promises. Enough time was given to the patient to ask questions, all of which were answered to the patient's satisfaction. Rick Carter has indicated that he wanted to continue with the procedure. Attestation: I, the ordering provider, attest that I have discussed with the patient the benefits, risks, side-effects, alternatives, likelihood of achieving goals, and potential problems during recovery for the procedure that I have provided informed consent. Date  Time: 03/23/2019  9:18 AM  Pre-Procedure Preparation:  Monitoring: As per clinic protocol. Respiration, ETCO2, SpO2, BP, heart rate and rhythm monitor placed and checked for adequate function Safety Precautions: Patient was assessed for positional comfort and pressure points before starting the procedure. Time-out: I initiated and conducted the "Time-out" before starting the procedure, as per protocol. The patient was asked to participate by confirming the accuracy of the "Time Out" information. Verification of the correct person, site, and procedure were performed and confirmed by me, the nursing staff, and the patient. "Time-out" conducted as per Joint Commission's Universal Protocol (UP.01.01.01). Time: 1049  Description of Procedure:          Target Area: The interlaminar space, initially targeting the lower laminar border of the superior vertebral body. Approach: Paramedial approach. Area Prepped: Entire Posterior Lumbar Region Prepping solution: 32M DuraPrep (Iodine Povacrylex [0.7% available iodine] and Isopropyl Alcohol, 74% w/w) Safety Precautions: Aspiration looking for blood return was conducted prior to all injections. At no point did we inject any substances, as a needle was being advanced. No attempts were made at seeking any paresthesias. Safe injection practices and needle disposal techniques used. Medications  properly checked for expiration dates. SDV (single dose vial) medications used. Description of the Procedure: Protocol guidelines were followed. The procedure needle was introduced through the skin, ipsilateral to the reported pain, and advanced to the target area. Bone was contacted and the needle walked caudad, until the lamina was cleared. The epidural space was identified using "loss-of-resistance technique" with 2-3 ml of PF-NaCl (0.9% NSS), in a 5cc LOR glass syringe.  Vitals:   03/23/19 1054 03/23/19 1103 03/23/19  1115 03/23/19 1124  BP: (!) 149/77 (!) 147/81 (!) 159/79 (!) 147/80  Pulse:      Resp: _0 Temp:      SpO2: 100% 100% 100% 100%  Weight:      Height:        Start Time: 1049 hrs. End Time: 1054 hrs.  Materials:  Needle(s) Type: Epidural needle Gauge: 17G Length: 3.5-in Medication(s): Please see orders for medications and dosing details.  Imaging Guidance (Spinal):          Type of Imaging Technique: Fluoroscopy Guidance (Spinal) Indication(s): Assistance in needle guidance and placement for procedures requiring needle placement in or near specific anatomical locations not easily accessible without such assistance. Exposure Time: Please see nurses notes. Contrast: Before injecting any contrast, we confirmed that the patient did not have an allergy to iodine, shellfish, or radiological contrast. Once satisfactory needle placement was completed at the desired level, radiological contrast was injected. Contrast injected under live fluoroscopy. No contrast complications. See chart for type and volume of contrast used. Fluoroscopic Guidance: I was personally present during the use of fluoroscopy. "Tunnel Vision Technique" used to obtain the best possible view of the target area. Parallax error corrected before commencing the procedure. "Direction-depth-direction" technique used to introduce the needle under continuous pulsed fluoroscopy. Once target was reached,  antero-posterior, oblique, and lateral fluoroscopic projection used confirm needle placement in all planes. Images permanently stored in EMR. Interpretation: I personally interpreted the imaging intraoperatively. Adequate needle placement confirmed in multiple planes. Appropriate spread of contrast into desired area was observed. No evidence of afferent or efferent intravascular uptake. No intrathecal or subarachnoid spread observed. Permanent images saved into the patient's record.  Antibiotic Prophylaxis:   Anti-infectives (From admission, onward)   None     Indication(s): None identified  Post-operative Assessment:  Post-procedure Vital Signs:  Pulse/HCG Rate: 7376 Temp: 98.1 F (36.7 C) Resp: 17 BP: (!) 147/80 SpO2: 100 %  EBL: None  Complications: No immediate post-treatment complications observed by team, or reported by patient.  Note: The patient tolerated the entire procedure well. A repeat set of vitals were taken after the procedure and the patient was kept under observation following institutional policy, for this type of procedure. Post-procedural neurological assessment was performed, showing return to baseline, prior to discharge. The patient was provided with post-procedure discharge instructions, including a section on how to identify potential problems. Should any problems arise concerning this procedure, the patient was given instructions to immediately contact us, at any time, without hesitation. In any case, we plan to contact the patient by telephone for a follow-up status report regarding this interventional procedure.  Comments:  No additional relevant information.  Plan of Care  Orders:  Orders Placed This Encounter  Procedures  . Lumbar Epidural Injection    Scheduling Instructions:     Procedure: Interlaminar LESI L4-5     Laterality: Right-sided     Sedation: Patient's choice     Timeframe:  Today    Order Specific Question:   Where will this procedure  be performed?    Answer:   [facility]  . DG PAIN [facility] C-ARM 1-60 MIN NO REPORT    Intraoperative interpretation by procedural physician at [facility].    Standing Status:   Standing    Number of Occurrences:   1    Order Specific Question:   Reason for exam:    Answer:   Assistance in needle guidance and placement for  procedures requiring needle placement in or near specific anatomical locations not easily accessible without such assistance.  . Provider attestation of informed consent for procedure/surgical case    I, the ordering provider, attest that I have discussed with the patient the benefits, risks, side effects, alternatives, likelihood of achieving goals and potential problems during recovery for the procedure that I have provided informed consent.    Standing Status:   Standing    Number of Occurrences:   1  . Informed Consent Details: Transcribe to consent form and obtain patient signature    Standing Status:   Standing    Number of Occurrences:   1    Order Specific Question:   Procedure    Answer:   Lumbar epidural steroid injection under fluoroscopic guidance. (See notes for level and laterality.)    Order Specific Question:   Surgeon    Answer:   Brianny Soulliere A. Dossie Arbour, MD    Order Specific Question:   Indication/Reason    Answer:   Low back pain and/or leg pain secondary to lumbar radiculitis/radiculopathy   Medications ordered for procedure: Meds ordered this encounter  Medications  . iohexol (OMNIPAQUE) 180 MG/ML injection 10 mL    Must be Myelogram-compatible. If not available, you may substitute with a water-soluble, non-ionic, hypoallergenic, myelogram-compatible radiological contrast medium.  Marland Kitchen lidocaine (XYLOCAINE) 2 % (with pres) injection 400 mg  . sodium chloride flush (NS) 0.9 % injection 2 mL  . ropivacaine (PF) 2 mg/mL (0.2%) (NAROPIN) injection 2 mL  . triamcinolone acetonide (KENALOG-40) injection 40 mg  . lactated ringers infusion  1,000 mL  . midazolam (VERSED) 5 MG/5ML injection 1-2 mg    Make sure Flumazenil is available in the pyxis when using this medication. If oversedation occurs, administer 0.2 mg IV over 15 sec. If after 45 sec no response, administer 0.2 mg again over 1 min; may repeat at 1 min intervals; not to exceed 4 doses (1 mg)   Medications administered: We administered iohexol, lidocaine, sodium chloride flush, ropivacaine (PF) 2 mg/mL (0.2%), triamcinolone acetonide, lactated ringers, and midazolam.  See the medical record for exact dosing, route, and time of administration.  Disposition: Discharge home  Discharge Date & Time: 03/23/2019; 1115 hrs.   Follow-up plan:   Return in about 2 weeks (around 04/06/2019) for (Virtual), E/M, (Post-procedrure).     Future Appointments  Date Time Provider [place]  03/31/2019 10:15 AM Milinda Pointer, MD [facility] None  04/12/2019 10:15 AM Milinda Pointer, MD [facility] None   Primary Care Physician: [facility] [place] Location: [facility] Note by: Gaspar Cola, MD Date: 03/23/2019; Time: 11:36 AM  Disclaimer:  Medicine is not an exact science. The only guarantee in medicine is that nothing is guaranteed. It is important to note that the decision to proceed with this intervention was based on the information collected from the patient. The Data and conclusions were drawn from the patient's questionnaire, the interview, and the physical examination. Because the information was provided in large part by the patient, it cannot be guaranteed that it has not been purposely or unconsciously manipulated. Every effort has been made to obtain as much relevant data as possible for this evaluation. It is important to note that the conclusions that lead to this procedure are derived in large part from the available data. Always take into account that the treatment will also be dependent on availability of resources and existing  treatment guidelines, considered by other Pain Management Practitioners as  being common knowledge and practice, at the time of the intervention. For Medico-Legal purposes, it is also important to point out that variation in procedural techniques and pharmacological choices are the acceptable norm. The indications, contraindications, technique, and results of the above procedure should only be interpreted and judged by a Board-Certified Interventional Pain Specialist with extensive familiarity and expertise in the same exact procedure and technique.

## 2019-03-24 ENCOUNTER — Telehealth: Payer: Self-pay

## 2019-03-24 NOTE — Telephone Encounter (Signed)
Post procedure phone call.  Patient states he is doing good.

## 2019-03-30 NOTE — Progress Notes (Signed)
Pain Management Virtual Encounter Note - Virtual Visit via Telephone Telehealth (real-time audio visits between healthcare provider and patient).   Patient's Phone No. & Preferred Pharmacy:  [phone] (home); [phone] (mobile); (Preferred) [phone] No e-mail address on record  [id number] [place] [id number]: [phone]: [phone]   Pre-screening note:  Our staff contacted Rick Carter offered him an "in person", "face-to-face" appointment versus a telephone encounter. He indicated preferring the telephone encounter, at this time.   Reason for Virtual Visit: COVID-19*  Social distancing based on CDC and AMA recommendations.   I contacted Rick PatrickSr. on 03/31/2019 via telephone.      I clearly identified myself as FGaspar Cola MD. I verified that I was speaking with the correct person using two identifiers (Name: Rick BEILKESr., and date of birth: 109/02/1951.  Advanced Informed Consent I sought verbal advanced consent from Rick Carter. I informed Rick Carter possible security and privacy concerns, risks, and limitations associated with providing "not-in-person" medical evaluation and management services. I also informed Rick Carter the availability of "in-person" appointments. Finally, I informed him that there would be a charge for the virtual visit and that he could be  personally, fully or partially, financially responsible for it. Rick Carter understanding and agreed to proceed.   Historic Elements   [name] is a 68y.o. year old, male patient evaluated today after his last encounter by our practice on 03/24/2019. 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 He also  has a past surgical history that includes Tonsillectomy; Lung removal, partial; and Neck surgery. Mr. CWeisenbergerhas a current medication list which includes the following prescription(s): lubiprostone, oxycodone, oxycodone, oxycodone, pregabalin, acetaminophen, acetylcysteine, tudorza pressair, advair diskus, albuterol, albuterol, aspirin ec, cholecalciferol, escitalopram, fluoxetine, fluticasone-salmeterol, fluticasone-salmeterol, furosemide, guaifenesin, hydrochlorothiazide, hydroxyzine, ipratropium-albuterol, levofloxacin, loratadine, lorazepam, losartan-hydrochlorothiazide, montelukast, multiple vitamins-minerals, pantoprazole, polyethylene glycol, senna-time, tizanidine, trazodone, umeclidinium bromide, and ventolin hfa. He  reports that he quit smoking about 2 months ago. His smoking use included cigarettes. He quit after 51.00 years of use. He has never used smokeless tobacco. He reports that he does not drink alcohol. No history on file for drug. [name] allergic to atorvastatin and varenicline.   HPI  Today, he is being contacted for both, medication management and a post-procedure assessment.  Today I will be discontinuing the patient's Robaxin since it is not covered by his insurance company and I will also substituted with Zanaflex 4 mg p.o. 3 times daily.  In a similar way, I will discontinue the docusate and start him on Amitiza.  He was recently found to have a lung mass and he is being worked up for possible cancer.  He has lost a significant amount of weight and his wife recently died as well.  Today I went over the results of his recent lumbar epidural steroid injection.  In addition I went ahead and took care of his refills.  [facility]  This would suggest that the patient may have a pulmonary nodule and parotid nodule, both of which may be  malignant.  I went ahead and  simply read to the patient the results of the impression, but I did not go over it in detail.  I also started by informing the patient that this was not my area of expertise and that he should not draw any conclusions based on the results, until he has talked to the ordering physician.  He understood and accepted.  I will continue to manage his medications and I will also continue to do PRN procedures as needed.  He has indicated having some pain in the upper back region, which may be secondary to his thoracic DDD, but it may also have a component associated to this possible malignancy.  We will continue to monitor and evaluate his thoracic pain for possible metastatic disease.  Pharmacotherapy Assessment  Analgesic: Oxycodone IR 5 mg one every 6 hours (20 mg/day) MME/day:30 mg/day   Monitoring: Pharmacotherapy: No side-effects or adverse reactions reported. [place] PMP: PDMP reviewed during this encounter.       Compliance: No problems identified. Effectiveness: Clinically acceptable. Plan: Refer to "POC".  Post-Procedure Evaluation  Procedure: Right-sided L4-5 lumbar epidural steroid injection under fluoroscopy guidance and IV sedation Pre-procedure pain level:  8/10 Post-procedure: 3/10 (> 50% relief)  Sedation: Please see nurses note.  Effectiveness during initial hour after procedure(Ultra-Short Term Relief): 100 %   Local anesthetic used: Long-acting (4-6 hours) Effectiveness: Defined as any analgesic benefit obtained secondary to the administration of local anesthetics. This carries significant diagnostic value as to the etiological location, or anatomical origin, of the pain. Duration of benefit is expected to coincide with the duration of the local anesthetic used.  Effectiveness during initial 4-6 hours after procedure(Short-Term Relief): 100 %   Long-term benefit: Defined as any relief past the pharmacologic duration of the local anesthetics.   Effectiveness past the initial 6 hours after procedure(Long-Term Relief): 70 %   Current benefits: Defined as benefit that persist at this time.   Analgesia:  >50% relief Function: Rick Carter reports improvement in function ROM: Rick Carter reports improvement in ROM  Pertinent Labs   SAFETY SCREENING Profile Lab Results  Component Value Date   SARSCOV2NAA NOT DETECTED 03/18/2019   COVIDSOURCE NASOPHARYNGEAL 03/18/2019   Renal Function Lab Results  Component Value Date   BUN 9 04/06/2018   CREATININE 0.84 04/06/2018   BCR 11 04/06/2018   GFRAA 104 04/06/2018   GFRNONAA 90 04/06/2018   Hepatic Function Lab Results  Component Value Date   AST 18 04/06/2018   ALT 24 01/06/2017   ALBUMIN 4.6 04/06/2018   UDS Summary  Date Value Ref Range Status  03/26/2018 FINAL  Final    Comment:    ==================================================================== TOXASSURE SELECT 13 (MW) ==================================================================== Test                             Result       Flag       Units Drug Present and Declared for Prescription Verification   Oxycodone                      740          EXPECTED   ng/mg creat   Oxymorphone                    2200         EXPECTED   ng/mg creat   Noroxycodone  1517         EXPECTED   ng/mg creat   Noroxymorphone                 623          EXPECTED   ng/mg creat    Sources of oxycodone are scheduled prescription medications.    Oxymorphone, noroxycodone, and noroxymorphone are expected    metabolites of oxycodone. Oxymorphone is also available as a    scheduled prescription medication. ==================================================================== Test                      Result    Flag   Units      Ref Range   Creatinine              35               mg/dL      >=20 ==================================================================== Declared Medications:  The flagging and interpretation  on this report are based on the  following declared medications.  Unexpected results may arise from  inaccuracies in the declared medications.  **Note: The testing scope of this panel includes these medications:  Oxycodone (Roxicet)  **Note: The testing scope of this panel does not include following  reported medications:  Acetaminophen (Roxicet)  Aclidinium (Tudorza Pressair)  Albuterol  Albuterol (Duoneb)  Aspirin (Aspirin 81)  Calcium carbonate (Calcium carbonate/Vitamin D3)  Cholecalciferol  Diphenhydramine (Benadryl)  Docusate (Colace)  Fluticasone (Advair)  Hydrochlorothiazide (Microzide)  Hydroxyzine (Atarax)  Ibuprofen (Advil)  Ipratropium (Duoneb)  Loratadine  Metformin  Methocarbamol  Multivitamin  Pantoprazole (Protonix)  Pregabalin (Lyrica)  Salmeterol (Advair)  Sildenafil (Viagra)  Trazodone  Vitamin D3 (Calcium carbonate/Vitamin D3) ==================================================================== For clinical consultation, please call [phone]. ====================================================================    Note: Above Lab results reviewed.  Recent imaging  DG C-Arm 1-60 Min-No Report Fluoroscopy was utilized by the requesting physician.  No radiographic  interpretation.   Assessment  The primary encounter diagnosis was Chronic pain syndrome. Diagnoses of Chronic low back pain (Primary Area of Pain) (midline), Chronic lower extremity pain (Secondary Area of Pain) (Right), DDD (degenerative disc disease), cervical, DDD (degenerative disc disease), lumbar, DDD (degenerative disc disease), thoracic, Neurogenic pain, Neuropathic pain, Opioid-induced constipation (OIC), and Chronic musculoskeletal pain were also pertinent to this visit.  Plan of Care  I have discontinued Rick Carter. Rick Sr.'s Aclidinium Bromide (TUDORZA PRESSAIR IN), diphenhydrAMINE, Calcium Carb-Cholecalciferol, sodium chloride HYPERTONIC, sildenafil, ibuprofen, docusate  sodium, methocarbamol, oxyCODONE-acetaminophen, oxyCODONE-acetaminophen, oxyCODONE-acetaminophen, and methocarbamol. I have also changed his pregabalin. Additionally, I am having him start on tiZANidine, lubiprostone, oxyCODONE, and oxyCODONE. Lastly, I am having him maintain his aspirin EC, traZODone, cholecalciferol, Multiple Vitamins-Minerals (PRESERVISION AREDS 2 PO), loratadine, Advair Diskus, hydrochlorothiazide, ipratropium-albuterol, albuterol, Ventolin HFA, acetylcysteine, guaiFENesin, levofloxacin, FLUoxetine, hydrOXYzine, montelukast, polyethylene glycol, acetaminophen, Senna-Time, umeclidinium bromide, albuterol, Fluticasone-Salmeterol, fluticasone-salmeterol, LORazepam, pantoprazole, losartan-hydrochlorothiazide, oxyCODONE, furosemide, Tudorza Pressair, and escitalopram.  Pharmacotherapy (Medications Ordered): Meds ordered this encounter  Medications  . oxyCODONE (OXY IR/ROXICODONE) 5 MG immediate release tablet    Sig: Take 1 tablet (5 mg total) by mouth every 6 (six) hours as needed for up to 30 days for severe pain. Must last 30 days.    Dispense:  120 tablet    Refill:  0    Chronic Pain: STOP Act - Not applicable. Fill 1 day early if closed on scheduled refill date. Do not fill until: 04/19/2019. To last until: 05/19/2019. Instruct to avoid benzodiazepines within 8 hours of opioid.  Marland Kitchen  pregabalin (LYRICA) 150 MG capsule    Sig: Take 1 capsule (150 mg total) by mouth 3 (three) times daily.    Dispense:  90 capsule    Refill:  2    Fill one day early if pharmacy is closed on scheduled refill date. May substitute for generic if available.  Marland Kitchen tiZANidine (ZANAFLEX) 4 MG tablet    Sig: Take 1 tablet (4 mg total) by mouth every 8 (eight) hours as needed for muscle spasms.    Dispense:  90 tablet    Refill:  2    Fill one day early if pharmacy is closed on scheduled refill date. May substitute for generic if available.  . lubiprostone (AMITIZA) 8 MCG capsule    Sig: Take 1 capsule (8 mcg  total) by mouth 2 (two) times daily with a meal. Swallow whole, do not break or chew the medication.    Dispense:  60 capsule    Refill:  2    Do not place this medication, or any other prescription from our practice, on "Automatic Refill". Patient may have prescription filled one day early if pharmacy is closed on scheduled refill date.  Marland Kitchen oxyCODONE (OXY IR/ROXICODONE) 5 MG immediate release tablet    Sig: Take 1 tablet (5 mg total) by mouth every 6 (six) hours as needed for up to 30 days for severe pain. Must last 30 days.    Dispense:  120 tablet    Refill:  0    Chronic Pain: STOP Act - Not applicable. Fill 1 day early if closed on scheduled refill date. Do not fill until: 05/19/2019. To last until: 06/18/2019. Instruct to avoid benzodiazepines within 8 hours of opioid.  Marland Kitchen oxyCODONE (OXY IR/ROXICODONE) 5 MG immediate release tablet    Sig: Take 1 tablet (5 mg total) by mouth every 6 (six) hours as needed for up to 30 days for severe pain. Must last 30 days.    Dispense:  120 tablet    Refill:  0    Chronic Pain: STOP Act - Not applicable. Fill 1 day early if closed on scheduled refill date. Do not fill until: 06/18/2019. To last until: 07/18/2019. Instruct to avoid benzodiazepines within 8 hours of opioid.   Orders:  No orders of the defined types were placed in this encounter.  Follow-up plan:   Return in about 15 weeks (around 07/14/2019) for (VV), E/M (MM).    Recent Visits Date Type Provider Dept  03/23/19 Procedure visit Milinda Pointer, MD [facility]  02/02/19 Telemedicine Vevelyn Francois, NP [facility] recent visits within past 60 days and meeting all other requirements   Today's Visits Date Type Provider Dept  03/31/19 Office Visit Milinda Pointer, MD [facility]  Showing today's visits and meeting all other requirements   Future Appointments Date Type Provider Dept  04/12/19 Appointment Milinda Pointer, MD [facility]   Showing future appointments within next 90 days and meeting all other requirements   I discussed the assessment and treatment plan with the patient. The patient was provided an opportunity to ask questions and all were answered. The patient agreed with the plan and demonstrated an understanding of the instructions.  Patient advised to call back or seek an in-person evaluation if the symptoms or condition worsens.  Total duration of non-face-to-face encounter: 13 minutes.  Note by: Gaspar Cola, MD Date: 03/31/2019; Time: 11:09 AM  Note: This dictation was prepared with Dragon dictation. Any transcriptional errors  that may result from this process are unintentional.  Disclaimer:  * Given the special circumstances of the COVID-19 pandemic, the federal government has announced that the Office for Civil Rights (OCR) will exercise its enforcement discretion and will not impose penalties on physicians using telehealth in the event of noncompliance with regulatory requirements under the [place] (HIPAA) in connection with the good faith provision of telehealth during the STMHD-62 national public health emergency. ([place])

## 2019-03-31 ENCOUNTER — Other Ambulatory Visit: Payer: Self-pay

## 2019-03-31 ENCOUNTER — Encounter: Payer: Medicaid Other | Admitting: Nurse Practitioner

## 2019-03-31 ENCOUNTER — Ambulatory Visit: Payer: Medicare Other | Attending: Nurse Practitioner | Admitting: Pain Medicine

## 2019-03-31 ENCOUNTER — Telehealth: Payer: Self-pay | Admitting: *Deleted

## 2019-03-31 DIAGNOSIS — G894 Chronic pain syndrome: Secondary | ICD-10-CM | POA: Diagnosis not present

## 2019-03-31 DIAGNOSIS — M79604 Pain in right leg: Secondary | ICD-10-CM | POA: Diagnosis not present

## 2019-03-31 DIAGNOSIS — K5903 Drug induced constipation: Secondary | ICD-10-CM

## 2019-03-31 DIAGNOSIS — M7918 Myalgia, other site: Secondary | ICD-10-CM

## 2019-03-31 DIAGNOSIS — T402X5A Adverse effect of other opioids, initial encounter: Secondary | ICD-10-CM

## 2019-03-31 DIAGNOSIS — M5030 Other cervical disc degeneration, unspecified cervical region: Secondary | ICD-10-CM | POA: Diagnosis not present

## 2019-03-31 DIAGNOSIS — M5441 Lumbago with sciatica, right side: Secondary | ICD-10-CM | POA: Diagnosis not present

## 2019-03-31 DIAGNOSIS — G8929 Other chronic pain: Secondary | ICD-10-CM

## 2019-03-31 DIAGNOSIS — M5134 Other intervertebral disc degeneration, thoracic region: Secondary | ICD-10-CM

## 2019-03-31 DIAGNOSIS — M5136 Other intervertebral disc degeneration, lumbar region: Secondary | ICD-10-CM

## 2019-03-31 DIAGNOSIS — M792 Neuralgia and neuritis, unspecified: Secondary | ICD-10-CM

## 2019-03-31 MED ORDER — OXYCODONE HCL 5 MG PO TABS: 5.0000 mg | ORAL_TABLET | Freq: Four times a day (QID) | ORAL | 0 refills | Status: DC | PRN

## 2019-03-31 MED ORDER — TIZANIDINE HCL 4 MG PO TABS: 4.0000 mg | ORAL_TABLET | Freq: Three times a day (TID) | ORAL | 2 refills | Status: DC | PRN

## 2019-03-31 MED ORDER — PREGABALIN 150 MG PO CAPS: 150.0000 mg | ORAL_CAPSULE | Freq: Three times a day (TID) | ORAL | 2 refills | Status: DC

## 2019-03-31 MED ORDER — LUBIPROSTONE 8 MCG PO CAPS: 8.0000 ug | ORAL_CAPSULE | Freq: Two times a day (BID) | ORAL | 2 refills | Status: DC

## 2019-04-12 ENCOUNTER — Ambulatory Visit: Payer: Medicaid Other | Admitting: Pain Medicine

## 2019-06-24 ENCOUNTER — Other Ambulatory Visit: Payer: Self-pay | Admitting: Pain Medicine

## 2019-06-24 DIAGNOSIS — T402X5A Adverse effect of other opioids, initial encounter: Secondary | ICD-10-CM

## 2019-06-24 DIAGNOSIS — K5903 Drug induced constipation: Secondary | ICD-10-CM

## 2019-07-01 ENCOUNTER — Ambulatory Visit: Payer: Medicare Other | Admitting: Pain Medicine

## 2019-07-01 ENCOUNTER — Encounter: Payer: Self-pay | Admitting: Pain Medicine

## 2019-07-01 ENCOUNTER — Ambulatory Visit
Admission: RE | Admit: 2019-07-01 | Discharge: 2019-07-01 | Disposition: A | Discharge status: Home / Self Care | Payer: Medicare Other | Source: Ambulatory Visit | Attending: Pain Medicine | Admitting: Pain Medicine

## 2019-07-01 ENCOUNTER — Other Ambulatory Visit: Payer: Self-pay

## 2019-07-01 VITALS — BP 148/79 | HR 69 | Temp 98.8°F | Resp 21 | Ht 69.0 in | Wt 123.0 lb

## 2019-07-01 DIAGNOSIS — F411 Generalized anxiety disorder: Secondary | ICD-10-CM | POA: Diagnosis present

## 2019-07-01 DIAGNOSIS — M48061 Spinal stenosis, lumbar region without neurogenic claudication: Secondary | ICD-10-CM | POA: Diagnosis present

## 2019-07-01 DIAGNOSIS — M5441 Lumbago with sciatica, right side: Secondary | ICD-10-CM | POA: Insufficient documentation

## 2019-07-01 DIAGNOSIS — M5136 Other intervertebral disc degeneration, lumbar region: Secondary | ICD-10-CM | POA: Diagnosis not present

## 2019-07-01 DIAGNOSIS — G8929 Other chronic pain: Secondary | ICD-10-CM | POA: Diagnosis present

## 2019-07-01 DIAGNOSIS — M5416 Radiculopathy, lumbar region: Secondary | ICD-10-CM | POA: Insufficient documentation

## 2019-07-01 DIAGNOSIS — M961 Postlaminectomy syndrome, not elsewhere classified: Secondary | ICD-10-CM | POA: Diagnosis present

## 2019-07-01 DIAGNOSIS — J439 Emphysema, unspecified: Secondary | ICD-10-CM

## 2019-07-01 DIAGNOSIS — M79604 Pain in right leg: Secondary | ICD-10-CM | POA: Insufficient documentation

## 2019-07-01 DIAGNOSIS — M47816 Spondylosis without myelopathy or radiculopathy, lumbar region: Secondary | ICD-10-CM | POA: Insufficient documentation

## 2019-07-01 MED ORDER — MIDAZOLAM HCL 5 MG/5ML IJ SOLN
INTRAMUSCULAR | Status: AC
  Filled 2019-07-01: qty 5

## 2019-07-01 MED ORDER — IOHEXOL 180 MG/ML  SOLN
10.0000 mL | Freq: Once | INTRAMUSCULAR | Status: AC
  Administered 2019-07-01: 10 mL via EPIDURAL

## 2019-07-01 MED ORDER — ROPIVACAINE HCL 2 MG/ML IJ SOLN
2.0000 mL | Freq: Once | INTRAMUSCULAR | Status: AC
  Administered 2019-07-01: 2 mL via EPIDURAL

## 2019-07-01 MED ORDER — LIDOCAINE HCL 2 % IJ SOLN
20.0000 mL | Freq: Once | INTRAMUSCULAR | Status: AC
  Administered 2019-07-01: 400 mg

## 2019-07-01 MED ORDER — MIDAZOLAM HCL 5 MG/5ML IJ SOLN
1.0000 mg | INTRAMUSCULAR | Status: DC | PRN
  Administered 2019-07-01: 2 mg via INTRAVENOUS

## 2019-07-01 MED ORDER — TRIAMCINOLONE ACETONIDE 40 MG/ML IJ SUSP
INTRAMUSCULAR | Status: AC
  Filled 2019-07-01: qty 1

## 2019-07-01 MED ORDER — SODIUM CHLORIDE (PF) 0.9 % IJ SOLN
INTRAMUSCULAR | Status: AC
  Filled 2019-07-01: qty 10

## 2019-07-01 MED ORDER — TRIAMCINOLONE ACETONIDE 40 MG/ML IJ SUSP
40.0000 mg | Freq: Once | INTRAMUSCULAR | Status: AC
  Administered 2019-07-01: 40 mg

## 2019-07-01 MED ORDER — LACTATED RINGERS IV SOLN
1000.0000 mL | Freq: Once | INTRAVENOUS | Status: AC
  Administered 2019-07-01: 1000 mL via INTRAVENOUS

## 2019-07-01 MED ORDER — SODIUM CHLORIDE 0.9% FLUSH
2.0000 mL | Freq: Once | INTRAVENOUS | Status: AC
  Administered 2019-07-01: 2 mL

## 2019-07-01 MED ORDER — LIDOCAINE HCL 2 % IJ SOLN
INTRAMUSCULAR | Status: AC
  Filled 2019-07-01: qty 20

## 2019-07-01 MED ORDER — ROPIVACAINE HCL 2 MG/ML IJ SOLN
INTRAMUSCULAR | Status: AC
  Filled 2019-07-01: qty 10

## 2019-07-01 NOTE — Progress Notes (Signed)
Patient's Name: Rick HEINY Sr.  MRN: [id number]  Referring Provider: Center, Alexander  DOB: [birth year]  PCP: Center, [place]  DOS: 07/01/2019  Note by: Gaspar Cola, MD  Service setting: Ambulatory outpatient  Specialty: Interventional Pain Management  Patient type: Established  Location: [facility] (AMB) Pain Management Facility  Visit type: Interventional Procedure   Primary Reason for Visit: Interventional Pain Management Treatment. CC: Back Pain (lower)  Procedure:          Anesthesia, Analgesia, Anxiolysis:  Type: Palliative Inter-Laminar Epidural Steroid Injection           Region: Lumbar Level: L4-5 Level. Laterality: Right-Sided Paramedial  Type: Moderate (Conscious) Sedation combined with Local Anesthesia Indication(s): Analgesia and Anxiety Route: Intravenous (IV) IV Access: Secured Sedation: Meaningful verbal contact was maintained at all times during the procedure  Local Anesthetic: Lidocaine 1-2%  Position: Prone with head of the table was raised to facilitate breathing.   Indications: 1. DDD (degenerative disc disease), lumbar   2. Failed back surgical syndrome   3. Lumbar foraminal stenosis (L5-S1) (Right)   4. Lumbar spondylosis   5. Chronic lumbar radicular pain (L5/S1 dermatome) (Right)   6. Chronic low back pain (Primary Area of Pain) (midline)   7. Chronic lower extremity pain (Secondary Area of Pain) (Right)    Pain Score: Pre-procedure: 8 /10 Post-procedure: 3 /10   Pre-op Assessment:  Rick Carter is a 68 y.o. (year old), male patient, seen today for interventional treatment. He  has a past surgical history that includes Tonsillectomy; Lung removal, partial; and Neck surgery. Rick Carter has a current medication list which includes the following prescription(s): acetaminophen, acetylcysteine, tudorza pressair, advair diskus, albuterol, albuterol, aspirin ec, cholecalciferol, escitalopram, fluoxetine, fluticasone-salmeterol, fluticasone-salmeterol,  furosemide, guaifenesin, hydrochlorothiazide, hydroxyzine, ipratropium-albuterol, levofloxacin, loratadine, lorazepam, losartan-hydrochlorothiazide, lubiprostone, montelukast, multiple vitamins-minerals, oxycodone, pantoprazole, pregabalin, tizanidine, trazodone, umeclidinium bromide, ventolin hfa, oxycodone, and oxycodone, and the following Facility-Administered Medications: midazolam. His primarily concern today is the Back Pain (lower)  Initial Vital Signs:  Pulse/HCG Rate: 69ECG Heart Rate: 75 Temp: 98.9 F (37.2 C) Resp: 18 BP: (!) 154/89 SpO2: 100 %(O2 at 3L BNC)  BMI: Estimated body mass index is 18.16 kg/m as calculated from the following:   Height as of this encounter: _0  (1.753 m).   Weight as of this encounter: 123 lb (55.8 kg).  Risk Assessment: Allergies: Reviewed. He is allergic to atorvastatin and varenicline.  Allergy Precautions: None required Coagulopathies: Reviewed. None identified.  Blood-thinner therapy: None at this time Active Infection(s): Reviewed. None identified. Rick Carter is afebrile  Site Confirmation: Rick Carter was asked to confirm the procedure and laterality before marking the site Procedure checklist: Completed Consent: Before the procedure and under the influence of no sedative(s), amnesic(s), or anxiolytics, the patient was informed of the treatment options, risks and possible complications. To fulfill our ethical and legal obligations, as recommended by the American Medical Association's Code of Ethics, I have informed the patient of my clinical impression; the nature and purpose of the treatment or procedure; the risks, benefits, and possible complications of the intervention; the alternatives, including doing nothing; the risk(s) and benefit(s) of the alternative treatment(s) or procedure(s); and the risk(s) and benefit(s) of doing nothing. The patient was provided information about the general risks and possible complications associated with the  procedure. These may include, but are not limited to: failure to achieve desired goals, infection, bleeding, organ or nerve damage, allergic reactions, paralysis, and death. In addition, the patient was informed of  those risks and complications associated to Spine-related procedures, such as failure to decrease pain; infection (i.e.: Meningitis, epidural or intraspinal abscess); bleeding (i.e.: epidural hematoma, subarachnoid hemorrhage, or any other type of intraspinal or peri-dural bleeding); organ or nerve damage (i.e.: Any type of peripheral nerve, nerve root, or spinal cord injury) with subsequent damage to sensory, motor, and/or autonomic systems, resulting in permanent pain, numbness, and/or weakness of one or several areas of the body; allergic reactions; (i.e.: anaphylactic reaction); and/or death. Furthermore, the patient was informed of those risks and complications associated with the medications. These include, but are not limited to: allergic reactions (i.e.: anaphylactic or anaphylactoid reaction(s)); adrenal axis suppression; blood sugar elevation that in diabetics may result in ketoacidosis or comma; water retention that in patients with history of congestive heart failure may result in shortness of breath, pulmonary edema, and decompensation with resultant heart failure; weight gain; swelling or edema; medication-induced neural toxicity; particulate matter embolism and blood vessel occlusion with resultant organ, and/or nervous system infarction; and/or aseptic necrosis of one or more joints. Finally, the patient was informed that Medicine is not an exact science; therefore, there is also the possibility of unforeseen or unpredictable risks and/or possible complications that may result in a catastrophic outcome. The patient indicated having understood very clearly. We have given the patient no guarantees and we have made no promises. Enough time was given to the patient to ask questions, all of  which were answered to the patient's satisfaction. Rick Carter has indicated that he wanted to continue with the procedure. Attestation: I, the ordering provider, attest that I have discussed with the patient the benefits, risks, side-effects, alternatives, likelihood of achieving goals, and potential problems during recovery for the procedure that I have provided informed consent. Date  Time: 07/01/2019  1:22 PM  Pre-Procedure Preparation:  Monitoring: As per clinic protocol. Respiration, ETCO2, SpO2, BP, heart rate and rhythm monitor placed and checked for adequate function Safety Precautions: Patient was assessed for positional comfort and pressure points before starting the procedure. Time-out: I initiated and conducted the "Time-out" before starting the procedure, as per protocol. The patient was asked to participate by confirming the accuracy of the "Time Out" information. Verification of the correct person, site, and procedure were performed and confirmed by me, the nursing staff, and the patient. "Time-out" conducted as per Joint Commission's Universal Protocol (UP.01.01.01). Time: 1350  Description of Procedure:          Target Area: The interlaminar space, initially targeting the lower laminar border of the superior vertebral body. Approach: Paramedial approach. Area Prepped: Entire Posterior Lumbar Region Prepping solution: DuraPrep (Iodine Povacrylex [0.7% available iodine] and Isopropyl Alcohol, 74% w/w) Safety Precautions: Aspiration looking for blood return was conducted prior to all injections. At no point did we inject any substances, as a needle was being advanced. No attempts were made at seeking any paresthesias. Safe injection practices and needle disposal techniques used. Medications properly checked for expiration dates. SDV (single dose vial) medications used. Description of the Procedure: Protocol guidelines were followed. The procedure needle was introduced through the skin,  ipsilateral to the reported pain, and advanced to the target area. Bone was contacted and the needle walked caudad, until the lamina was cleared. The epidural space was identified using "loss-of-resistance technique" with 2-3 ml of PF-NaCl (0.9% NSS), in a 5cc LOR glass syringe.  Vitals:   07/01/19 1355 07/01/19 1405 07/01/19 1415 07/01/19 1425  BP: (!) 157/77 (!) 158/75 (!) 148/77 (!) 148/79  Pulse:  Resp: 18 (!) 21 (!) 21 (!) 21  Temp:  98.8 F (37.1 C)    TempSrc:  Temporal    SpO2: 100% 100% 100% 100%  Weight:      Height:        Start Time: 1350 hrs. End Time: 1355 hrs.  Materials:  Needle(s) Type: Epidural needle Gauge: 17G Length: 3.5-in Medication(s): Please see orders for medications and dosing details.  Imaging Guidance (Spinal):          Type of Imaging Technique: Fluoroscopy Guidance (Spinal) Indication(s): Assistance in needle guidance and placement for procedures requiring needle placement in or near specific anatomical locations not easily accessible without such assistance. Exposure Time: Please see nurses notes. Contrast: Before injecting any contrast, we confirmed that the patient did not have an allergy to iodine, shellfish, or radiological contrast. Once satisfactory needle placement was completed at the desired level, radiological contrast was injected. Contrast injected under live fluoroscopy. No contrast complications. See chart for type and volume of contrast used. Fluoroscopic Guidance: I was personally present during the use of fluoroscopy. "Tunnel Vision Technique" used to obtain the best possible view of the target area. Parallax error corrected before commencing the procedure. "Direction-depth-direction" technique used to introduce the needle under continuous pulsed fluoroscopy. Once target was reached, antero-posterior, oblique, and lateral fluoroscopic projection used confirm needle placement in all planes. Images permanently stored in  EMR. Interpretation: I personally interpreted the imaging intraoperatively. Adequate needle placement confirmed in multiple planes. Appropriate spread of contrast into desired area was observed. No evidence of afferent or efferent intravascular uptake. No intrathecal or subarachnoid spread observed. Permanent images saved into the patient's record.  Antibiotic Prophylaxis:   Anti-infectives (From admission, onward)   None     Indication(s): None identified  Post-operative Assessment:  Post-procedure Vital Signs:  Pulse/HCG Rate: 6963 Temp: 98.8 F (37.1 C) Resp: (!) 21 BP: (!) 148/79 SpO2: 100 %  EBL: None  Complications: No immediate post-treatment complications observed by team, or reported by patient.  Note: The patient tolerated the entire procedure well. A repeat set of vitals were taken after the procedure and the patient was kept under observation following institutional policy, for this type of procedure. Post-procedural neurological assessment was performed, showing return to baseline, prior to discharge. The patient was provided with post-procedure discharge instructions, including a section on how to identify potential problems. Should any problems arise concerning this procedure, the patient was given instructions to immediately contact us, at any time, without hesitation. In any case, we plan to contact the patient by telephone for a follow-up status report regarding this interventional procedure.  Comments:  No additional relevant information.  Plan of Care  Orders:  Orders Placed This Encounter  Procedures  . Lumbar Epidural Injection    Scheduling Instructions:     Procedure: Interlaminar LESI L4-5     Laterality: Right-sided     Sedation: Patient's choice     Timeframe:  Today    Order Specific Question:   Where will this procedure be performed?    Answer:   [facility]  . DG PAIN [facility] C-ARM 1-60 MIN NO REPORT    Intraoperative interpretation by  procedural physician at [place].    Standing Status:   Standing    Number of Occurrences:   1    Order Specific Question:   Reason for exam:    Answer:   Assistance in needle guidance and placement for procedures requiring needle placement in or near  specific anatomical locations not easily accessible without such assistance.  . Follow-up    Special Instructions: None    Scheduling Instructions:     Schedule follow-up visit.     Type: Virtual Visit (VV) Post-procedure (PP) evaluation (E/M)     Timeframe: 2 weeks  . Informed Consent Details: Physician/Practitioner Attestation; Transcribe to consent form and obtain patient signature    Surgeon: Demoni Gergen A. Dossie Arbour, MD    Scheduling Instructions:     Procedure: Lumbar epidural steroid injection under fluoroscopic guidance     Indications: Low back and/or lower extremity pain secondary to lumbar radiculitis  . Oxygen therapy Mode or (Route): Nasal cannula; Liters Per Minute: 2; Keep 02 saturation: Above 92%    Keep patient on oxygen by [place]. Discontinue once pulse oxymetry is back to normal levels for patient.    Standing Status:   Standing    Number of Occurrences:   1    Order Specific Question:   Mode or (Route)    Answer:   Nasal cannula    Order Specific Question:   Liters Per Minute    Answer:   2    Order Specific Question:   Keep 02 saturation    Answer:   Above 92%   Chronic Opioid Analgesic:  Oxycodone IR 5 mg, 1 tab PO q 6 hrs (20 mg/day of oxycodone) MME/day:30 mg/day.   Medications ordered for procedure: Meds ordered this encounter  Medications  . iohexol (OMNIPAQUE) 180 MG/ML injection 10 mL    Must be Myelogram-compatible. If not available, you may substitute with a water-soluble, non-ionic, hypoallergenic, myelogram-compatible radiological contrast medium.  Marland Kitchen lidocaine (XYLOCAINE) 2 % (with pres) injection 400 mg  . sodium chloride flush (NS) 0.9 % injection 2 mL  . ropivacaine (PF) 2 mg/mL (0.2%)  (NAROPIN) injection 2 mL  . triamcinolone acetonide (KENALOG-40) injection 40 mg  . lactated ringers infusion 1,000 mL  . midazolam (VERSED) 5 MG/5ML injection 1-2 mg    Make sure Flumazenil is available in the pyxis when using this medication. If oversedation occurs, administer 0.2 mg IV over 15 sec. If after 45 sec no response, administer 0.2 mg again over 1 min; may repeat at 1 min intervals; not to exceed 4 doses (1 mg)   Medications administered: We administered iohexol, lidocaine, sodium chloride flush, ropivacaine (PF) 2 mg/mL (0.2%), triamcinolone acetonide, lactated ringers, and midazolam.  See the medical record for exact dosing, route, and time of administration.  Follow-up plan:   No follow-ups on file.       Interventional management options:  Considering:   Diagnostic right-sided L3-4 TFESI    Palliative PRN treatment(s):   Palliativebilateral lumbar facet block Palliative left cervical ESI  Palliative right T8-9 thoracic ESI  Palliative right L5-S1 TFESI  Palliative right L4-5 interlaminar ESI  Palliative left hand, ring finger, trigger finger injection     Recent Visits No visits were found meeting these conditions.  Showing recent visits within past 90 days and meeting all other requirements   Today's Visits Date Type Provider Dept  07/01/19 Procedure visit Milinda Pointer, MD [facility]  Showing today's visits and meeting all other requirements   Future Appointments Date Type Provider Dept  07/05/19 Appointment Milinda Pointer, MD [facility]  07/26/19 Appointment Milinda Pointer, MD [facility]  Showing future appointments within next 90 days and meeting all other requirements   Disposition: Discharge home  Discharge Date & Time: 07/01/2019; 1429 hrs.   Primary Care  Physician: Center, [place] Location: [facility] Note by: Gaspar Cola, MD Date: 07/01/2019; Time: 2:35  PM  Disclaimer:  Medicine is not an Chief Strategy Officer. The only guarantee in medicine is that nothing is guaranteed. It is important to note that the decision to proceed with this intervention was based on the information collected from the patient. The Data and conclusions were drawn from the patient's questionnaire, the interview, and the physical examination. Because the information was provided in large part by the patient, it cannot be guaranteed that it has not been purposely or unconsciously manipulated. Every effort has been made to obtain as much relevant data as possible for this evaluation. It is important to note that the conclusions that lead to this procedure are derived in large part from the available data. Always take into account that the treatment will also be dependent on availability of resources and existing treatment guidelines, considered by other Pain Management Practitioners as being common knowledge and practice, at the time of the intervention. For Medico-Legal purposes, it is also important to point out that variation in procedural techniques and pharmacological choices are the acceptable norm. The indications, contraindications, technique, and results of the above procedure should only be interpreted and judged by a Board-Certified Interventional Pain Specialist with extensive familiarity and expertise in the same exact procedure and technique.

## 2019-07-01 NOTE — Patient Instructions (Signed)
____________________________________________________________________________________________  Post-Procedure Discharge Instructions  Instructions:  Apply ice:   Purpose: This will minimize any swelling and discomfort after procedure.   When: Day of procedure, as soon as you get home.  How: Fill a plastic sandwich bag with crushed ice. Cover it with a small towel and apply to injection site.  How long: (15 min on, 15 min off) Apply for 15 minutes then remove x 15 minutes.  Repeat sequence on day of procedure, until you go to bed.  Apply heat:   Purpose: To treat any soreness and discomfort from the procedure.  When: Starting the next day after the procedure.  How: Apply heat to procedure site starting the day following the procedure.  How long: May continue to repeat daily, until discomfort goes away.  Food intake: Start with clear liquids (like water) and advance to regular food, as tolerated.   Physical activities: Keep activities to a minimum for the first 8 hours after the procedure. After that, then as tolerated.  Driving: If you have received any sedation, be responsible and do not drive. You are not allowed to drive for 24 hours after having sedation.  Blood thinner: (Applies only to those taking blood thinners) You may restart your blood thinner 6 hours after your procedure.  Insulin: (Applies only to Diabetic patients taking insulin) As soon as you can eat, you may resume your normal dosing schedule.  Infection prevention: Keep procedure site clean and dry. Shower daily and clean area with soap and water.  Post-procedure Pain Diary: Extremely important that this be done correctly and accurately. Recorded information will be used to determine the next step in treatment. For the purpose of accuracy, follow these rules:  Evaluate only the area treated. Do not report or include pain from an untreated area. For the purpose of this evaluation, ignore all other areas of pain,  except for the treated area.  After your procedure, avoid taking a long nap and attempting to complete the pain diary after you wake up. Instead, set your alarm clock to go off every hour, on the hour, for the initial 8 hours after the procedure. Document the duration of the numbing medicine, and the relief you are getting from it.  Do not go to sleep and attempt to complete it later. It will not be accurate. If you received sedation, it is likely that you were given a medication that may cause amnesia. Because of this, completing the diary at a later time may cause the information to be inaccurate. This information is needed to plan your care.  Follow-up appointment: Keep your post-procedure follow-up evaluation appointment after the procedure (usually 2 weeks for most procedures, 6 weeks for radiofrequencies). DO NOT FORGET to bring you pain diary with you.   Expect: (What should I expect to see with my procedure?)  From numbing medicine (AKA: Local Anesthetics): Numbness or decrease in pain. You may also experience some weakness, which if present, could last for the duration of the local anesthetic.  Onset: Full effect within 15 minutes of injected.  Duration: It will depend on the type of local anesthetic used. On the average, 1 to 8 hours.   From steroids (Applies only if steroids were used): Decrease in swelling or inflammation. Once inflammation is improved, relief of the pain will follow.  Onset of benefits: Depends on the amount of swelling present. The more swelling, the longer it will take for the benefits to be seen. In some cases, up to 10 days.  Duration: Steroids will stay in the system x 2 weeks. Duration of benefits will depend on multiple posibilities including persistent irritating factors.  Side-effects: If present, they may typically last 2 weeks (the duration of the steroids).  Frequent: Cramps (if they occur, drink Gatorade and take over-the-counter Magnesium 450-500 mg  once to twice a day); water retention with temporary weight gain; increases in blood sugar; decreased immune system response; increased appetite.  Occasional: Facial flushing (red, warm cheeks); mood swings; menstrual changes.  Uncommon: Long-term decrease or suppression of natural hormones; bone thinning. (These are more common with higher doses or more frequent use. This is why we prefer that our patients avoid having any injection therapies in other practices.)   Very Rare: Severe mood changes; psychosis; aseptic necrosis.  From procedure: Some discomfort is to be expected once the numbing medicine wears off. This should be minimal if ice and heat are applied as instructed.  Call if: (When should I call?)  You experience numbness and weakness that gets worse with time, as opposed to wearing off.  New onset bowel or bladder incontinence. (Applies only to procedures done in the spine)  Emergency Numbers:  Durning business hours (Monday - Thursday, 8:00 AM - 4:00 PM) (Friday, 9:00 AM - 12:00 Noon): [phone]  After hours: [phone]  NOTE: If you are having a problem and are unable connect with, or to talk to a provider, then go to your nearest urgent care or emergency department. If the problem is serious and urgent, please call 911. ____________________________________________________________________________________________

## 2019-07-02 ENCOUNTER — Telehealth: Payer: Self-pay

## 2019-07-02 NOTE — Telephone Encounter (Signed)
No answer. Unable to leave message.

## 2019-07-04 NOTE — Progress Notes (Signed)
Pain Management Virtual Encounter Note - Virtual Visit via Telephone Telehealth (real-time audio visits between healthcare provider and patient).   Patient's Phone No. & Preferred Pharmacy:  [phone] (home); [phone] (mobile); (Preferred) [phone] No e-mail address on record  [id number] [place] [address]Phone: [phone]: [phone]   Pre-screening note:  Our staff contacted [name] offered him an "in person", "face-to-face" appointment versus a telephone encounter. He indicated preferring the telephone encounter, at this time.   Reason for Virtual Visit: COVID-19*  Social distancing based on CDC and AMA recommendations.   I contacted Rick PatrickSr. on 07/05/2019 via telephone.      I clearly identified myself as Rick Cola MD. I verified that I was speaking with the correct person using two identifiers (Name: Rick AMORYSr., and date of birth: 120-Jun-1952.  Advanced Informed Consent I sought verbal advanced consent from Rick Carter virtual visit interactions. I informed [name] possible security and privacy concerns, risks, and limitations associated with providing "not-in-person" medical evaluation and management services. I also informed [name] the availability of "in-person" appointments. Finally, I informed him that there would be a charge for the virtual visit and that he could be  personally, fully or partially, financially responsible for it. Mr. CGonyerexpressed understanding and agreed to proceed.   Historic Elements   [name] is a 68y.o. year old, male patient evaluated today after his last encounter by our practice on 07/02/2019. Rick Carter has a past medical history of Anxiety, Bronchitis, COPD (chronic obstructive pulmonary disease) [place], Depression, Hernia of abdominal wall, History of alcoholism ([place] (08/08/2015),  History of pneumonectomy (08/08/2015), Hypertension, Macular degeneration (08/26/2016), and Tumor cells. He also  has a past surgical history that includes Tonsillectomy; Lung removal, partial; and Neck surgery. Mr. CMartinohas a current medication list which includes the following prescription(s): acetaminophen, acetylcysteine, tudorza pressair, advair diskus, albuterol, albuterol, aspirin ec, cholecalciferol, escitalopram, fluoxetine, fluticasone-salmeterol, fluticasone-salmeterol, furosemide, guaifenesin, hydrochlorothiazide, hydroxyzine, ipratropium-albuterol, levofloxacin, loratadine, lorazepam, losartan-hydrochlorothiazide, lubiprostone, montelukast, multiple vitamins-minerals, oxycodone, oxycodone, oxycodone, pantoprazole, pregabalin, tizanidine, trazodone, umeclidinium bromide, and ventolin hfa. He  reports that he quit smoking about 5 months ago. His smoking use included cigarettes. He quit after 51.00 years of use. He has never used smokeless tobacco. He reports that he does not drink alcohol. No history on file for drug. [name] allergic to atorvastatin and varenicline.   HPI  Today, he is being contacted for both, medication management and a post-procedure assessment.  The patient indicates having done extremely well after the procedure.  Right now he says that his pain in the lower back is just about a 2/10.  Today we will refill his medications and I have informed him that we will need to update his lab work.  I will be seeing him again for the left trigger finger injection, as soon as possible.  Post-Procedure Evaluation  Procedure: Palliative right L4-5 LESI under fluoro and IV sedation Pre-procedure pain level:  8/10 Post-procedure: 3/10 (> 50% relief)  Sedation: Sedation provided.  Effectiveness during initial hour after procedure(Ultra-Short Term Relief):   100%  Local anesthetic used: Long-acting (4-6 hours) Effectiveness: Defined as any analgesic benefit obtained secondary to  the administration of local anesthetics. This carries significant diagnostic value as to the etiological location, or anatomical origin, of the pain. Duration of benefit  is expected to coincide with the duration of the local anesthetic used.  Effectiveness during initial 4-6 hours after procedure(Short-Term Relief):   100%  Long-term benefit: Defined as any relief past the pharmacologic duration of the local anesthetics.  Effectiveness past the initial 6 hours after procedure(Long-Term Relief):   90%  Current benefits: Defined as benefit that persist at this time.   Analgesia:  90-100% better Function: Rick Carter reports improvement in function ROM: Rick Carter reports improvement in ROM  Pharmacotherapy Assessment  Analgesic: Oxycodone IR 5 mg, 1 tab PO q 6 hrs (20 mg/day of oxycodone) MME/day:30 mg/day.   Monitoring: Pharmacotherapy: No side-effects or adverse reactions reported. [place] PMP: PDMP reviewed during this encounter.       Compliance: No problems identified. Effectiveness: Clinically acceptable. Plan: Refer to "POC".  UDS:  Summary  Date Value Ref Range Status  03/26/2018 FINAL  Final    Comment:    ==================================================================== TOXASSURE SELECT 13 (MW) ==================================================================== Test                             Result       Flag       Units Drug Present and Declared for Prescription Verification   Oxycodone                      740          EXPECTED   ng/mg creat   Oxymorphone                    2200         EXPECTED   ng/mg creat   Noroxycodone                   1517         EXPECTED   ng/mg creat   Noroxymorphone                 623          EXPECTED   ng/mg creat    Sources of oxycodone are scheduled prescription medications.    Oxymorphone, noroxycodone, and noroxymorphone are expected    metabolites of oxycodone. Oxymorphone is also available as a    scheduled prescription  medication. ==================================================================== Test                      Result    Flag   Units      Ref Range   Creatinine              35               mg/dL      >=20 ==================================================================== Declared Medications:  The flagging and interpretation on this report are based on the  following declared medications.  Unexpected results may arise from  inaccuracies in the declared medications.  **Note: The testing scope of this panel includes these medications:  Oxycodone (Roxicet)  **Note: The testing scope of this panel does not include following  reported medications:  Acetaminophen (Roxicet)  Aclidinium (Tudorza Pressair)  Albuterol  Albuterol (Duoneb)  Aspirin (Aspirin 81)  Calcium carbonate (Calcium carbonate/Vitamin D3)  Cholecalciferol  Diphenhydramine (Benadryl)  Docusate (Colace)  Fluticasone (Advair)  Hydrochlorothiazide (Microzide)  Hydroxyzine (Atarax)  Ibuprofen (Advil)  Ipratropium (Duoneb)  Loratadine  Metformin  Methocarbamol  Multivitamin  Pantoprazole (Protonix)  Pregabalin (Lyrica)  Salmeterol (Advair)  Sildenafil (Viagra)  Trazodone  Vitamin D3 (Calcium carbonate/Vitamin D3) ==================================================================== For clinical consultation, please call [phone]. ====================================================================    Laboratory Chemistry Profile (12 mo)  Renal: No results found for requested labs within last 8760 hours.  Lab Results  Component Value Date   GFRAA 104 04/06/2018   GFRNONAA 90 04/06/2018   Hepatic: No results found for requested labs within last 8760 hours. Lab Results  Component Value Date   AST 18 04/06/2018   ALT 24 01/06/2017   Other: No results found for requested labs within last 8760 hours. Note: Above Lab results reviewed.  Imaging  Last 90 days:  Dg Pain Clinic C-arm 1-60 Min No  Report  Result Date: 07/01/2019 Fluoro was used, but no Radiologist interpretation will be provided. Please refer to "NOTES" tab for provider progress note.   Assessment  The primary encounter diagnosis was Chronic pain syndrome. Diagnoses of Chronic low back pain (Primary Area of Pain) (midline), Chronic lower extremity pain (Secondary Area of Pain) (Right), Chronic musculoskeletal pain, Neurogenic pain, Neuropathic pain, Opioid-induced constipation (OIC), Pharmacologic therapy, Disorder of skeletal system, Problems influencing health status, Trigger finger (middle finger) (Left), Cervicalgia, Chronic lumbar radicular pain (L5/S1 dermatome) (Right), and Thoracic radicular pain (Right) were also pertinent to this visit.  Plan of Care  I have discontinued Sr. Louellen Molder. Foote Sr.'s oxyCODONE and oxyCODONE. I have also changed his oxyCODONE. Additionally, I am having him start on oxyCODONE and oxyCODONE. Lastly, I am having him maintain his aspirin EC, traZODone, cholecalciferol, Multiple Vitamins-Minerals (PRESERVISION AREDS 2 PO), loratadine, Advair Diskus, hydrochlorothiazide, ipratropium-albuterol, albuterol, Ventolin HFA, acetylcysteine, guaiFENesin, levofloxacin, FLUoxetine, hydrOXYzine, montelukast, acetaminophen, umeclidinium bromide, albuterol, Fluticasone-Salmeterol, fluticasone-salmeterol, LORazepam, pantoprazole, losartan-hydrochlorothiazide, furosemide, Tudorza Pressair, escitalopram, tiZANidine, pregabalin, and lubiprostone.  Pharmacotherapy (Medications Ordered): Meds ordered this encounter  Medications  . tiZANidine (ZANAFLEX) 4 MG tablet    Sig: Take 1 tablet (4 mg total) by mouth every 8 (eight) hours as needed for muscle spasms.    Dispense:  90 tablet    Refill:  2    Fill one day early if pharmacy is closed on scheduled refill date. May substitute for generic if available.  . pregabalin (LYRICA) 150 MG capsule    Sig: Take 1 capsule (150 mg total) by mouth 3 (three) times  daily.    Dispense:  90 capsule    Refill:  2    Fill one day early if pharmacy is closed on scheduled refill date. May substitute for generic if available.  . lubiprostone (AMITIZA) 8 MCG capsule    Sig: Take 1 capsule (8 mcg total) by mouth 2 (two) times daily with a meal. Swallow whole, do not break or chew the medication.    Dispense:  60 capsule    Refill:  2    Do not place this medication, or any other prescription from our practice, on "Automatic Refill". Patient may have prescription filled one day early if pharmacy is closed on scheduled refill date.  Marland Kitchen oxyCODONE (OXY IR/ROXICODONE) 5 MG immediate release tablet    Sig: Take 1 tablet (5 mg total) by mouth every 6 (six) hours as needed for severe pain. Must last 30 days.    Dispense:  120 tablet    Refill:  0    Chronic Pain: STOP Act (Not applicable) Fill 1 day early if closed on refill date. Do not fill until: 07/18/2019. To last until: 08/17/2019. Avoid benzodiazepines within 8 hours of opioids  . oxyCODONE (OXY IR/ROXICODONE) 5 MG immediate release tablet  Sig: Take 1 tablet (5 mg total) by mouth every 6 (six) hours as needed for severe pain. Must last 30 days.    Dispense:  120 tablet    Refill:  0    Chronic Pain: STOP Act (Not applicable) Fill 1 day early if closed on refill date. Do not fill until: 08/17/2019. To last until: 09/16/2019. Avoid benzodiazepines within 8 hours of opioids  . oxyCODONE (OXY IR/ROXICODONE) 5 MG immediate release tablet    Sig: Take 1 tablet (5 mg total) by mouth every 6 (six) hours as needed for severe pain. Must last 30 days.    Dispense:  120 tablet    Refill:  0    Chronic Pain: STOP Act (Not applicable) Fill 1 day early if closed on refill date. Do not fill until: 09/16/2019. To last until: 10/16/2019. Avoid benzodiazepines within 8 hours of opioids   Orders:  Orders Placed This Encounter  Procedures  . Injection tendon or ligament    Standing Status:   Future    Standing Expiration Date:    08/04/2019    Scheduling Instructions:     Type of Block:  left hand, ring finger (No.:4)(A-4 pulley), trigger finger injection     Side: Left-sided     Sedation: No Sedation.     Timeframe: 07/26/19  . Cervical Epidural Injection    Procedure: Cervical Epidural Steroid Injection/Block Purpose: Palliative Indication(s): Radiculitis and/or cervicalgia associater with cervical degenerative disc disease.    Standing Status:   Standing    Number of Occurrences:   6    Standing Expiration Date:   01/01/2021    Scheduling Instructions:     Level(s): C7-T1     Laterality: Left-sided     Sedation: With Sedation.     Timeframe: PRN    Order Specific Question:   Where will this procedure be performed?    Answer:   [facility]    Comments:   by Dr. Dossie Arbour  . Thoracic Epidural Injection    Fluoroscopy upper back pain and thoracic radicular pain (chest or abdominal)    Standing Status:   Standing    Number of Occurrences:   1    Standing Expiration Date:   07/04/2020    Scheduling Instructions:     Level: T8-9     Laterality: Right-sided     Sedation: With Sedation.     TIMEFRAME: PRN procedure. (Rick Carter will call when needed.)    Order Specific Question:   Where will this procedure be performed?    Answer:   [facility]  . Lumbar Epidural Injection    Standing Status:   Standing    Number of Occurrences:   9    Standing Expiration Date:   01/01/2021    Scheduling Instructions:     Purpose: Palliative     Indication: Lower extremity pain/Sciatica right (M54.31).     Side: Right-sided     Level: L4-5     Sedation: With Sedation.     TIMEFRAME: PRN procedure. (Rick Carter will call when needed.)    Order Specific Question:   Where will this procedure be performed?    Answer:   [facility]  . Injection tendon or ligament    Standing Status:   Standing    Number of Occurrences:   6    Standing Expiration Date:   07/04/2020    Scheduling Instructions:      Type of Block:  left hand, ring  finger (No.:4)(A-4 pulley), trigger finger injection     Side: Left-sided     Sedation: No Sedation.     Timeframe: PRN Procedure. Patient will call.  Raelene Bott Select 13 (MW), Urine    Volume: 30 ml(s). Minimum 3 ml of urine is needed. Document temperature of fresh sample. Indications: Long term (current) use of opiate analgesic (Z79.891)  . Comp. Metabolic Panel (12)    With GFR. Indications: Chronic Pain Syndrome (G89.4) & Pharmacotherapy (I62.703)    Order Specific Question:   Has the patient fasted?    Answer:   No    Order Specific Question:   CC Results    Answer:   PCP-NURSE [[id number]]  . Magnesium    Indication: Pharmacologic therapy (H82.993)    Order Specific Question:   CC Results    Answer:   PCP-NURSE [[id number]]  . Vitamin B12    Indication: Pharmacologic therapy (E93.810).    Order Specific Question:   CC Results    Answer:   PCP-NURSE [[id number]]  . Sedimentation rate    Indication: Disorder of skeletal system (M89.9)    Order Specific Question:   CC Results    Answer:   PCP-NURSE [[id number]]  . 25-Hydroxyvitamin D Lcms D2+D3    Indication: Disorder of skeletal system (M89.9).    Order Specific Question:   CC Results    Answer:   PCP-NURSE [[id number]]  . C-reactive protein    Indication: Problems influencing health status (Z78.9)    Order Specific Question:   CC Results    Answer:   PCP-NURSE [[id number]]   Follow-up plan:   Return for Procedure (no sedation): (L) ring finger, trigger finger injection #2.      Interventional management options:  Considering:   Diagnostic right-sided L3 TFESI  Diagnostic right-sided L5 TFESI    Palliative PRN treatment(s):   Palliativebilateral lumbar facet block #2 Palliative left cervical ESI #6  Palliative right T8-9 thoracic ESI #4  Palliative ML T6-7 thoracic ESI #3  Palliative right L5-S1 LESI #2  Palliative right L4-5 LESI #5  Palliative left hand, ring finger (No.:4)(A-4 pulley), trigger  finger injection #3  Palliative ML L2-3 interspinous ligament injection #2     Recent Visits Date Type Provider Dept  07/01/19 Procedure visit Milinda Pointer, MD [facility]  Showing recent visits within past 90 days and meeting all other requirements   Today's Visits Date Type Provider Dept  07/05/19 Office Visit Milinda Pointer, MD [facility]  Showing today's visits and meeting all other requirements   Future Appointments Date Type Provider Dept  07/26/19 Appointment Milinda Pointer, MD [facility]  Showing future appointments within next 90 days and meeting all other requirements   I discussed the assessment and treatment plan with the patient. The patient was provided an opportunity to ask questions and all were answered. The patient agreed with the plan and demonstrated an understanding of the instructions.  Patient advised to call back or seek an in-person evaluation if the symptoms or condition worsens.  Total duration of non-face-to-face encounter: 12 minutes.  Note by: Gaspar Cola, MD Date: 07/05/2019; Time: 3:14 PM  Note: This dictation was prepared with Dragon dictation. Any transcriptional errors that may result from this process are unintentional.  Disclaimer:  * Given the special circumstances of the COVID-19 pandemic, the federal government has announced that the Office for Civil Rights (OCR) will exercise its enforcement discretion and will not impose penalties on physicians using telehealth in the  event of noncompliance with regulatory requirements under the [employer] and Accountability Act (HIPAA) in connection with the good faith provision of telehealth during the IXVEZ-50 national public health emergency. (AMA)

## 2019-07-05 ENCOUNTER — Other Ambulatory Visit: Payer: Self-pay

## 2019-07-05 ENCOUNTER — Ambulatory Visit: Payer: Medicare Other | Attending: Pain Medicine | Admitting: Pain Medicine

## 2019-07-05 DIAGNOSIS — M899 Disorder of bone, unspecified: Secondary | ICD-10-CM

## 2019-07-05 DIAGNOSIS — G894 Chronic pain syndrome: Secondary | ICD-10-CM | POA: Diagnosis not present

## 2019-07-05 DIAGNOSIS — M5414 Radiculopathy, thoracic region: Secondary | ICD-10-CM

## 2019-07-05 DIAGNOSIS — T402X5A Adverse effect of other opioids, initial encounter: Secondary | ICD-10-CM

## 2019-07-05 DIAGNOSIS — K5903 Drug induced constipation: Secondary | ICD-10-CM

## 2019-07-05 DIAGNOSIS — Z789 Other specified health status: Secondary | ICD-10-CM

## 2019-07-05 DIAGNOSIS — M79604 Pain in right leg: Secondary | ICD-10-CM | POA: Diagnosis not present

## 2019-07-05 DIAGNOSIS — M5441 Lumbago with sciatica, right side: Secondary | ICD-10-CM | POA: Diagnosis not present

## 2019-07-05 DIAGNOSIS — M65332 Trigger finger, left middle finger: Secondary | ICD-10-CM

## 2019-07-05 DIAGNOSIS — M7918 Myalgia, other site: Secondary | ICD-10-CM

## 2019-07-05 DIAGNOSIS — M5416 Radiculopathy, lumbar region: Secondary | ICD-10-CM

## 2019-07-05 DIAGNOSIS — M792 Neuralgia and neuritis, unspecified: Secondary | ICD-10-CM

## 2019-07-05 DIAGNOSIS — Z79899 Other long term (current) drug therapy: Secondary | ICD-10-CM

## 2019-07-05 DIAGNOSIS — M542 Cervicalgia: Secondary | ICD-10-CM

## 2019-07-05 DIAGNOSIS — G8929 Other chronic pain: Secondary | ICD-10-CM

## 2019-07-05 MED ORDER — OXYCODONE HCL 5 MG PO TABS: 5.0000 mg | ORAL_TABLET | Freq: Four times a day (QID) | ORAL | 0 refills | Status: DC | PRN

## 2019-07-05 MED ORDER — TIZANIDINE HCL 4 MG PO TABS: 4.0000 mg | ORAL_TABLET | Freq: Three times a day (TID) | ORAL | 2 refills | Status: DC | PRN

## 2019-07-05 MED ORDER — LUBIPROSTONE 8 MCG PO CAPS: 8.0000 ug | ORAL_CAPSULE | Freq: Two times a day (BID) | ORAL | 2 refills | Status: DC

## 2019-07-05 MED ORDER — PREGABALIN 150 MG PO CAPS: 150.0000 mg | ORAL_CAPSULE | Freq: Three times a day (TID) | ORAL | 2 refills | Status: DC

## 2019-07-05 NOTE — Patient Instructions (Signed)

## 2019-07-08 ENCOUNTER — Ambulatory Visit: Payer: Medicare Other | Admitting: Pain Medicine

## 2019-07-08 DIAGNOSIS — M79645 Pain in left finger(s): Secondary | ICD-10-CM | POA: Insufficient documentation

## 2019-07-08 NOTE — Progress Notes (Deleted)
Patient's Name: Rick PORTELL Sr.  MRN: [id number]  Referring Provider: [facility], [place]  DOB: Feb 23, 1951  PCP: [facility], [place]  DOS: 07/08/2019  Note by: Gaspar Cola, MD  Service setting: Ambulatory outpatient  Specialty: Interventional Pain Management  Patient type: Established  Location: [facility] (AMB) Pain Management Facility  Visit type: Interventional Procedure   Primary Reason for Visit: Interventional Pain Management Treatment. CC: No chief complaint on file.  Procedure:          Anesthesia, Analgesia, Anxiolysis:  Type: Trigger Finger Ligament/Tendon sheath (20550) Injection.  #3  Purpose: Therapeutic Target Area: Flexor Digitorum Tendon sheath nodule Region: A-4 pulley of the metacarpal area Approach: Percutaneous Digit: No:4(Ring) Finger Laterality: Left-hand  Type: Local Anesthesia Indication(s): Analgesia         Local Anesthetic: Lidocaine 1-2% Route: Infiltration ([place]/IM) IV Access: Declined Sedation: Declined   Position: Sitting   Indications: 1. Trigger finger (middle finger) (Left)   2. Pain in finger of left hand    Pain Score: Pre-procedure:  /10 Post-procedure:  /10   Pre-op Assessment:  Rick Carter is a 68 y.o. (year old), male patient, seen today for interventional treatment. He  has a past surgical history that includes Tonsillectomy; Lung removal, partial; and Neck surgery. Rick Carter has a current medication list which includes the following prescription(s): acetaminophen, acetylcysteine, tudorza pressair, advair diskus, albuterol, albuterol, aspirin ec, cholecalciferol, escitalopram, fluoxetine, fluticasone-salmeterol, fluticasone-salmeterol, furosemide, guaifenesin, hydrochlorothiazide, hydroxyzine, ipratropium-albuterol, levofloxacin, loratadine, lorazepam, losartan-hydrochlorothiazide, lubiprostone, montelukast, multiple vitamins-minerals, oxycodone, oxycodone, oxycodone, pantoprazole, pregabalin, tizanidine, trazodone, umeclidinium bromide, and  ventolin hfa. His primarily concern today is the No chief complaint on file.  Initial Vital Signs:  Pulse/HCG Rate:    Temp:   Resp:   BP:   SpO2:    BMI: Estimated body mass index is 18.16 kg/m as calculated from the following:   Height as of 07/01/19: _0  (1.753 m).   Weight as of 07/01/19: 123 lb (55.8 kg).  Risk Assessment: Allergies: Reviewed. He is allergic to atorvastatin and varenicline.  Allergy Precautions: None required Coagulopathies: Reviewed. None identified.  Blood-thinner therapy: None at this time Active Infection(s): Reviewed. None identified. Rick Carter is afebrile  Site Confirmation: Rick Carter was asked to confirm the procedure and laterality before marking the site Procedure checklist: Completed Consent: Before the procedure and under the influence of no sedative(s), amnesic(s), or anxiolytics, the patient was informed of the treatment options, risks and possible complications. To fulfill our ethical and legal obligations, as recommended by the American Medical Association's Code of Ethics, I have informed the patient of my clinical impression; the nature and purpose of the treatment or procedure; the risks, benefits, and possible complications of the intervention; the alternatives, including doing nothing; the risk(s) and benefit(s) of the alternative treatment(s) or procedure(s); and the risk(s) and benefit(s) of doing nothing. The patient was provided information about the general risks and possible complications associated with the procedure. These may include, but are not limited to: failure to achieve desired goals, infection, bleeding, organ or nerve damage, allergic reactions, paralysis, and death. In addition, the patient was informed of those risks and complications associated to the procedure, such as failure to decrease pain; infection; bleeding; organ or nerve damage with subsequent damage to sensory, motor, and/or autonomic systems, resulting in permanent  pain, numbness, and/or weakness of one or several areas of the body; allergic reactions; (i.e.: anaphylactic reaction); and/or death. Furthermore, the patient was informed of those risks and complications associated with the  medications. These include, but are not limited to: allergic reactions (i.e.: anaphylactic or anaphylactoid reaction(s)); adrenal axis suppression; blood sugar elevation that in diabetics may result in ketoacidosis or comma; water retention that in patients with history of congestive heart failure may result in shortness of breath, pulmonary edema, and decompensation with resultant heart failure; weight gain; swelling or edema; medication-induced neural toxicity; particulate matter embolism and blood vessel occlusion with resultant organ, and/or nervous system infarction; and/or aseptic necrosis of one or more joints. Finally, the patient was informed that Medicine is not an exact science; therefore, there is also the possibility of unforeseen or unpredictable risks and/or possible complications that may result in a catastrophic outcome. The patient indicated having understood very clearly. We have given the patient no guarantees and we have made no promises. Enough time was given to the patient to ask questions, all of which were answered to the patient's satisfaction. Rick Carter has indicated that he wanted to continue with the procedure. Attestation: I, the ordering provider, attest that I have discussed with the patient the benefits, risks, side-effects, alternatives, likelihood of achieving goals, and potential problems during recovery for the procedure that I have provided informed consent. Date   Time: {CHL [facility]-PAIN TIME CHOICES:[id number]}  Pre-Procedure Preparation:  Monitoring: As per clinic protocol. Respiration, ETCO2, SpO2, BP, heart rate and rhythm monitor placed and checked for adequate function Safety Precautions: Patient was assessed for positional comfort and pressure  points before starting the procedure. Time-out: I initiated and conducted the "Time-out" before starting the procedure, as per protocol. The patient was asked to participate by confirming the accuracy of the "Time Out" information. Verification of the correct person, site, and procedure were performed and confirmed by me, the nursing staff, and the patient. "Time-out" conducted as per Joint Commission's Universal Protocol (UP.01.01.01). Time:    Description of Procedure:          Area Prepped: Entire palmar and dorsal aspect of hand, up to forearm area. Prepping solution: DuraPrep (Iodine Povacrylex [0.7% available iodine] and Isopropyl Alcohol, 74% w/w) Safety Precautions: Aspiration looking for blood return was conducted prior to all injections. At no point did we inject any substances, as a needle was being advanced. No attempts were made at seeking any paresthesias. Safe injection practices and needle disposal techniques used. Medications properly checked for expiration dates. SDV (single dose vial) medications used. Description of the Procedure: Protocol guidelines were followed. The patient was placed in position. The target area was identified and prepped in the usual manner. Skin & deeper tissues infiltrated with local anesthetic. Appropriate time provided for local anesthetics to take effect. The procedure needle was slowly advanced to target area. Proper needle placement secured. Negative aspiration confirmed. Solution injected in intermittent fashion, asking for systemic symptoms every 0.5cc. Needle(s) removed and area cleaned, making sure to leave some prepping solution back to take advantage of its long term bactericidal properties.  There were no vitals filed for this visit.  Start Time:   hrs. End Time:   hrs. Materials:  Needle(s) Type: Epidural needle Gauge: 20G Length: 3.5-in Medication(s): Please see orders for medications and dosing details.    Imaging Guidance:            Type of Imaging Technique: None used Indication(s): N/A Exposure Time: No patient exposure Contrast: None used. Fluoroscopic Guidance: N/A Ultrasound Guidance: N/A Interpretation: N/A  Post-operative Assessment:  Post-procedure Vital Signs:  Pulse/HCG Rate:    Temp:   Resp:   BP:  SpO2:    EBL: None  Complications: No immediate post-treatment complications observed by team, or reported by patient.  Note: The patient tolerated the entire procedure well. A repeat set of vitals were taken after the procedure and the patient was kept under observation following institutional policy, for this type of procedure. Post-procedural neurological assessment was performed, showing return to baseline, prior to discharge. The patient was provided with post-procedure discharge instructions, including a section on how to identify potential problems. Should any problems arise concerning this procedure, the patient was given instructions to immediately contact us, at any time, without hesitation. In any case, we plan to contact the patient by telephone for a follow-up status report regarding this interventional procedure.  Comments:  No additional relevant information.  Plan of Care  Orders:  No orders of the defined types were placed in this encounter.  Chronic Opioid Analgesic:  Oxycodone IR 5 mg, 1 tab PO q 6 hrs (20 mg/day of oxycodone) MME/day:30 mg/day.   Medications ordered for procedure: No orders of the defined types were placed in this encounter.  Medications administered: Sr. Pat Patrick Sr. had no medications administered during this visit.  See the medical record for exact dosing, route, and time of administration.  Follow-up plan:   No follow-ups on file.       Interventional management options:  Considering:   Diagnostic right-sided L3 TFESI  Diagnostic right-sided L5 TFESI    Palliative PRN treatment(s):   Palliativebilateral lumbar facet block #2 Palliative  left cervical ESI #6  Palliative right T8-9 thoracic ESI #4  Palliative ML T6-7 thoracic ESI #3  Palliative right L5-S1 LESI #2  Palliative right L4-5 LESI #5  Palliative left hand, ring finger (No.:4)(A-4 pulley), trigger finger injection #4  Palliative ML L2-3 interspinous ligament injection #2      Recent Visits Date Type Provider Dept  07/05/19 Office Visit Milinda Pointer, MD [facility]  07/01/19 Procedure visit Milinda Pointer, MD [facility]  Showing recent visits within past 90 days and meeting all other requirements   Today's Visits Date Type Provider Dept  07/08/19 Appointment Milinda Pointer, MD [facility]  Showing today's visits and meeting all other requirements   Future Appointments Date Type Provider Dept  07/26/19 Appointment Milinda Pointer, MD [facility]  Showing future appointments within next 90 days and meeting all other requirements   Disposition: Discharge home  Discharge Date & Time: 07/08/2019;   hrs.   Primary Care Physician: [facility], [place] Location: [facility] Note by: Gaspar Cola, MD Date: 07/08/2019; Time: 8:15 AM  Disclaimer:  Medicine is not an Chief Strategy Officer. The only guarantee in medicine is that nothing is guaranteed. It is important to note that the decision to proceed with this intervention was based on the information collected from the patient. The Data and conclusions were drawn from the patient's questionnaire, the interview, and the physical examination. Because the information was provided in large part by the patient, it cannot be guaranteed that it has not been purposely or unconsciously manipulated. Every effort has been made to obtain as much relevant data as possible for this evaluation. It is important to note that the conclusions that lead to this procedure are derived in large part from the available data. Always take into account that the  treatment will also be dependent on availability of resources and existing treatment guidelines, considered by other Pain Management Practitioners as being common knowledge and practice,  at the time of the intervention. For Medico-Legal purposes, it is also important to point out that variation in procedural techniques and pharmacological choices are the acceptable norm. The indications, contraindications, technique, and results of the above procedure should only be interpreted and judged by a Board-Certified Interventional Pain Specialist with extensive familiarity and expertise in the same exact procedure and technique.

## 2019-07-22 ENCOUNTER — Telehealth: Payer: Self-pay | Admitting: *Deleted

## 2019-07-22 ENCOUNTER — Encounter: Payer: Self-pay | Admitting: Pain Medicine

## 2019-07-22 NOTE — Telephone Encounter (Signed)
Attempted to call for pre appointment review of allergies/meds. Message left.

## 2019-07-25 NOTE — Progress Notes (Signed)
Pain Management Virtual Encounter Note - Virtual Visit via Telephone Telehealth (real-time audio visits between healthcare provider and patient).   Patient's Phone No. & Preferred Pharmacy:  [phone] (home); [phone] (mobile); (Preferred) [phone] No e-mail address on record  [id number] [place] [address]Phone: [phone]: [phone]   Pre-screening note:  Our staff contacted Mr. CGardellaand offered him an "in person", "face-to-face" appointment versus a telephone encounter. He indicated preferring the telephone encounter, at this time.   Reason for Virtual Visit: COVID-19*  Social distancing based on CDC and AMA recommendations.   I contacted Rick Carter. on 07/26/2019 via telephone.      I clearly identified myself as FGaspar Cola MD. I verified that I was speaking with the correct person using two identifiers (Name: Rick WALKUPSr., and date of birth: 112-24-1952.  Advanced Informed Consent I sought verbal advanced consent from JMeridianfor virtual visit interactions. I informed Mr. CAdellof possible security and privacy concerns, risks, and limitations associated with providing "not-in-person" medical evaluation and management services. I also informed Mr. CKleveof the availability of "in-person" appointments. Finally, I informed him that there would be a charge for the virtual visit and that he could be  personally, fully or partially, financially responsible for it. Mr. CSamsonexpressed understanding and agreed to proceed.   Historic Elements   [name] is a 68y.o. year old, male patient evaluated today after his last encounter by our practice on 07/22/2019. Rick Carter has a past medical history of Anxiety, Bronchitis, COPD (chronic obstructive pulmonary disease) (HNiles, Depression, Hernia of abdominal wall, History of alcoholism (HOpp (08/08/2015),  History of pneumonectomy (08/08/2015), Hypertension, Macular degeneration (08/26/2016), and Tumor cells. He also  has a past surgical history that includes Tonsillectomy; Lung removal, partial; and Neck surgery. Mr. CHardenbrookhas a current medication list which includes the following prescription(s): acetaminophen, acetylcysteine, tudorza pressair, advair diskus, albuterol, albuterol, aspirin ec, cholecalciferol, escitalopram, fluoxetine, fluticasone-salmeterol, fluticasone-salmeterol, furosemide, guaifenesin, hydrochlorothiazide, hydroxyzine, ipratropium-albuterol, levofloxacin, loratadine, lorazepam, losartan-hydrochlorothiazide, lubiprostone, lubiprostone, montelukast, multiple vitamins-minerals, oxycodone, oxycodone, oxycodone, pantoprazole, pregabalin, pregabalin, tizanidine, tizanidine, trazodone, umeclidinium bromide, and ventolin hfa. He  reports that he quit smoking about 6 months ago. His smoking use included cigarettes. He quit after 51.00 years of use. He has never used smokeless tobacco. He reports that he does not drink alcohol. No history on file for drug. Mr. CPapadakisis allergic to atorvastatin and varenicline.   HPI  Today, he is being contacted for both, medication management and a post-procedure assessment.  The patient indicates that he was recently diagnosed with lung cancer.  He has his plate full with appointments for the treatment of this cancer.  They will be starting with radiation therapy for his lungs and they will go on from there.  At this point, he is still interested in doing the injection for his trigger finger, but at least he feels that he is doing about 85% better than that way that he was before the injection.  He may have to postpone the trigger finger injection until he gets all of this other cancer treatments squared.  The patient indicates doing well with the current medication regimen. No adverse reactions or side effects reported to the medications.   Post-Procedure  Evaluation  Procedure: Palliative right L4-5 LESI under fluoro and IV sedation Pre-procedure pain level:  8/10 Post-procedure:  3/10 (> 50% relief)  Sedation: Sedation provided.  Effectiveness during initial hour after procedure(Ultra-Short Term Relief):   100%  Local anesthetic used: Long-acting (4-6 hours) Effectiveness: Defined as any analgesic benefit obtained secondary to the administration of local anesthetics. This carries significant diagnostic value as to the etiological location, or anatomical origin, of the pain. Duration of benefit is expected to coincide with the duration of the local anesthetic used.  Effectiveness during initial 4-6 hours after procedure(Short-Term Relief):   100%  Long-term benefit: Defined as any relief past the pharmacologic duration of the local anesthetics.  Effectiveness past the initial 6 hours after procedure(Long-Term Relief):   90%  Current benefits: Defined as benefit that persist at this time.              Analgesia:  85% better Function: Rick Carter reports improvement in function ROM: Rick Carter reports improvement in ROM  Pharmacotherapy Assessment  Analgesic: Oxycodone IR 5 mg, 1 tab PO q 6 hrs (20 mg/day of oxycodone) MME/day:30 mg/day.   Monitoring: Pharmacotherapy: No side-effects or adverse reactions reported. [place] PMP: PDMP reviewed during this encounter.       Compliance: No problems identified. Effectiveness: Clinically acceptable. Plan: Refer to "POC".  UDS:  Summary  Date Value Ref Range Status  03/26/2018 FINAL  Final    Comment:    ==================================================================== TOXASSURE SELECT 13 (MW) ==================================================================== Test                             Result       Flag       Units Drug Present and Declared for Prescription Verification   Oxycodone                      740          EXPECTED   ng/mg creat   Oxymorphone                    2200          EXPECTED   ng/mg creat   Noroxycodone                   1517         EXPECTED   ng/mg creat   Noroxymorphone                 623          EXPECTED   ng/mg creat    Sources of oxycodone are scheduled prescription medications.    Oxymorphone, noroxycodone, and noroxymorphone are expected    metabolites of oxycodone. Oxymorphone is also available as a    scheduled prescription medication. ==================================================================== Test                      Result    Flag   Units      Ref Range   Creatinine              35               mg/dL      >=20 ==================================================================== Declared Medications:  The flagging and interpretation on this report are based on the  following declared medications.  Unexpected results may arise from  inaccuracies in the declared medications.  **Note: The testing scope of this panel includes these medications:  Oxycodone (Roxicet)  **Note: The testing scope of this panel does  not include following  reported medications:  Acetaminophen (Roxicet)  Aclidinium (Tudorza Pressair)  Albuterol  Albuterol (Duoneb)  Aspirin (Aspirin 81)  Calcium carbonate (Calcium carbonate/Vitamin D3)  Cholecalciferol  Diphenhydramine (Benadryl)  Docusate (Colace)  Fluticasone (Advair)  Hydrochlorothiazide (Microzide)  Hydroxyzine (Atarax)  Ibuprofen (Advil)  Ipratropium (Duoneb)  Loratadine  Metformin  Methocarbamol  Multivitamin  Pantoprazole (Protonix)  Pregabalin (Lyrica)  Salmeterol (Advair)  Sildenafil (Viagra)  Trazodone  Vitamin D3 (Calcium carbonate/Vitamin D3) ==================================================================== For clinical consultation, please call [phone]. ====================================================================    Laboratory Chemistry Profile (12 mo)  Renal: No results found for requested labs within last 8760 hours.  Lab Results  Component Value  Date   GFRAA 104 04/06/2018   GFRNONAA 90 04/06/2018   Hepatic: No results found for requested labs within last 8760 hours. Lab Results  Component Value Date   AST 18 04/06/2018   ALT 24 01/06/2017   Other: No results found for requested labs within last 8760 hours. Note: Above Lab results reviewed.  Imaging  Last 90 days:  Dg [facility] Clinic C-arm 1-60 Min No Report  Result Date: 07/01/2019 Fluoro was used, but no Radiologist interpretation will be provided. Please refer to "NOTES" tab for provider progress note.   Assessment  The primary encounter diagnosis was Chronic pain syndrome. Diagnoses of Chronic low back pain (Primary Area of Pain) (midline), Chronic lower extremity pain (Secondary Area of Pain) (Right), Pharmacologic therapy, Disorder of skeletal system, Problems influencing health status, Chronic musculoskeletal pain, Neurogenic pain, Neuropathic pain, Opioid-induced constipation (OIC), and Trigger finger (middle finger) (Left) were also pertinent to this visit.  Plan of Care  I am having Sr. Louellen Molder Haugan Sr. start on tiZANidine, pregabalin, and lubiprostone. I am also having him maintain his aspirin EC, traZODone, cholecalciferol, Multiple Vitamins-Minerals (PRESERVISION AREDS 2 PO), loratadine, Advair Diskus, hydrochlorothiazide, ipratropium-albuterol, albuterol, Ventolin HFA, acetylcysteine, guaiFENesin, levofloxacin, FLUoxetine, hydrOXYzine, montelukast, acetaminophen, umeclidinium bromide, albuterol, Fluticasone-Salmeterol, fluticasone-salmeterol, LORazepam, pantoprazole, losartan-hydrochlorothiazide, furosemide, Tudorza Pressair, escitalopram, tiZANidine, pregabalin, lubiprostone, oxyCODONE, oxyCODONE, and oxyCODONE.  Pharmacotherapy (Medications Ordered): Meds ordered this encounter  Medications  . oxyCODONE (OXY IR/ROXICODONE) 5 MG immediate release tablet    Sig: Take 1 tablet (5 mg total) by mouth every 6 (six) hours as needed for severe pain. Must last 30 days.     Dispense:  120 tablet    Refill:  0    Chronic Pain: STOP Act (Not applicable) Fill 1 day early if closed on refill date. Do not fill until: 10/16/2019. To last until: 11/15/2019. Avoid benzodiazepines within 8 hours of opioids  . tiZANidine (ZANAFLEX) 4 MG tablet    Sig: Take 1 tablet (4 mg total) by mouth every 8 (eight) hours as needed for muscle spasms.    Dispense:  90 tablet    Refill:  5    Fill one day early if pharmacy is closed on scheduled refill date. May substitute for generic if available.  . pregabalin (LYRICA) 150 MG capsule    Sig: Take 1 capsule (150 mg total) by mouth 3 (three) times daily.    Dispense:  90 capsule    Refill:  5    Fill one day early if pharmacy is closed on scheduled refill date. May substitute for generic if available.  . lubiprostone (AMITIZA) 8 MCG capsule    Sig: Take 1 capsule (8 mcg total) by mouth 2 (two) times daily with a meal. Swallow whole, do not break or chew the medication.    Dispense:  60 capsule  Refill:  5    Do not place this medication, or any other prescription from our practice, on "Automatic Refill". Patient may have prescription filled one day early if pharmacy is closed on scheduled refill date.   Orders:  Orders Placed This Encounter  Procedures  . ToxASSURE Select 13 (MW), Urine    Volume: 30 ml(s). Minimum 3 ml of urine is needed. Document temperature of fresh sample. Indications: Long term (current) use of opiate analgesic (Z79.891)  . Comp. Metabolic Panel (12)    With GFR. Indications: Chronic Pain Syndrome (G89.4) & Pharmacotherapy (W80.321)    Order Specific Question:   Has the patient fasted?    Answer:   No    Order Specific Question:   CC Results    Answer:   PCP-NURSE [[id number]]  . Magnesium    Indication: Pharmacologic therapy (O03.704)    Order Specific Question:   CC Results    Answer:   PCP-NURSE [[id number]]  . Vitamin B12    Indication: Pharmacologic therapy (X45.038).    Order Specific Question:   CC  Results    Answer:   PCP-NURSE [[id number]]  . Sedimentation rate    Indication: Disorder of skeletal system (M89.9)    Order Specific Question:   CC Results    Answer:   PCP-NURSE [[id number]]  . 25-Hydroxyvitamin D Lcms D2+D3    Indication: Disorder of skeletal system (M89.9).    Order Specific Question:   CC Results    Answer:   PCP-NURSE [[id number]]  . C-reactive protein    Indication: Problems influencing health status (Z78.9)    Order Specific Question:   CC Results    Answer:   PCP-NURSE [[id number]]   Follow-up plan:   Return in 16 weeks (on 11/15/2019) for Procedure (no sedation): (L) ring finger, trigger finger injection #2.      Interventional management options:  Considering:   Diagnostic right-sided L3 TFESI  Diagnostic right-sided L5 TFESI    Palliative PRN treatment(s):   Palliativebilateral lumbar facet block #2 Palliative left cervical ESI #6  Palliative right T8-9 thoracic ESI #4  Palliative ML T6-7 thoracic ESI #3  Palliative right L5-S1 LESI #2  Palliative right L4-5 LESI #5  Palliative left hand, ring finger (No.:4)(A-4 pulley), trigger finger injection #4  Palliative ML L2-3 interspinous ligament injection #2       Recent Visits Date Type Provider Dept  07/05/19 Office Visit Milinda Pointer, MD [facility]  07/01/19 Procedure visit Milinda Pointer, MD [facility]  Showing recent visits within past 90 days and meeting all other requirements   Today's Visits Date Type Provider Dept  07/26/19 Office Visit Milinda Pointer, MD [facility]  Showing today's visits and meeting all other requirements   Future Appointments No visits were found meeting these conditions.  Showing future appointments within next 90 days and meeting all other requirements   I discussed the assessment and treatment plan with the patient. The patient was provided an opportunity to ask questions and all were answered. The patient agreed with the plan and  demonstrated an understanding of the instructions.  Patient advised to call back or seek an in-person evaluation if the symptoms or condition worsens.  Total duration of non-face-to-face encounter: 15 minutes.  Note by: Gaspar Cola, MD Date: 07/26/2019; Time: 3:01 PM  Note: This dictation was prepared with Dragon dictation. Any transcriptional errors that may result from this process are unintentional.  Disclaimer:  * Given the special circumstances of the  COVID-19 pandemic, the federal government has announced that [employer] ([employer]) will exercise its enforcement discretion and will not impose penalties on physicians using telehealth in the event of noncompliance with regulatory requirements under the [place] (HIPAA) in connection with the good faith provision of telehealth during the SNKNL-97 national public health emergency. ([place])

## 2019-07-26 ENCOUNTER — Ambulatory Visit: Payer: Medicare Other | Attending: Pain Medicine | Admitting: Pain Medicine

## 2019-07-26 ENCOUNTER — Other Ambulatory Visit: Payer: Self-pay

## 2019-07-26 DIAGNOSIS — T402X5A Adverse effect of other opioids, initial encounter: Secondary | ICD-10-CM

## 2019-07-26 DIAGNOSIS — Z789 Other specified health status: Secondary | ICD-10-CM

## 2019-07-26 DIAGNOSIS — M7918 Myalgia, other site: Secondary | ICD-10-CM

## 2019-07-26 DIAGNOSIS — G894 Chronic pain syndrome: Secondary | ICD-10-CM | POA: Diagnosis not present

## 2019-07-26 DIAGNOSIS — M65332 Trigger finger, left middle finger: Secondary | ICD-10-CM

## 2019-07-26 DIAGNOSIS — M899 Disorder of bone, unspecified: Secondary | ICD-10-CM

## 2019-07-26 DIAGNOSIS — K5903 Drug induced constipation: Secondary | ICD-10-CM

## 2019-07-26 DIAGNOSIS — Z79899 Other long term (current) drug therapy: Secondary | ICD-10-CM | POA: Diagnosis not present

## 2019-07-26 DIAGNOSIS — M79604 Pain in right leg: Secondary | ICD-10-CM

## 2019-07-26 DIAGNOSIS — M5441 Lumbago with sciatica, right side: Secondary | ICD-10-CM | POA: Diagnosis not present

## 2019-07-26 DIAGNOSIS — G8929 Other chronic pain: Secondary | ICD-10-CM

## 2019-07-26 DIAGNOSIS — M792 Neuralgia and neuritis, unspecified: Secondary | ICD-10-CM

## 2019-07-26 MED ORDER — PREGABALIN 150 MG PO CAPS: 150.0000 mg | ORAL_CAPSULE | Freq: Three times a day (TID) | ORAL | 5 refills | Status: DC

## 2019-07-26 MED ORDER — OXYCODONE HCL 5 MG PO TABS: 5.0000 mg | ORAL_TABLET | Freq: Four times a day (QID) | ORAL | 0 refills | Status: DC | PRN

## 2019-07-26 MED ORDER — LUBIPROSTONE 8 MCG PO CAPS: 8.0000 ug | ORAL_CAPSULE | Freq: Two times a day (BID) | ORAL | 5 refills | Status: DC

## 2019-07-26 MED ORDER — TIZANIDINE HCL 4 MG PO TABS: 4.0000 mg | ORAL_TABLET | Freq: Three times a day (TID) | ORAL | 5 refills | Status: DC | PRN

## 2019-07-26 NOTE — Patient Instructions (Signed)
____________________________________________________________________________________________  Preparing for your procedure (without sedation)  Procedure appointments are limited to planned procedures: . No Prescription Refills. . No disability issues will be discussed. . No medication changes will be discussed.  Instructions: . Oral Intake: Do not eat or drink anything for at least 3 hours prior to your procedure. . Transportation: Unless otherwise stated by your physician, you may drive yourself after the procedure. . Blood Pressure Medicine: Take your blood pressure medicine with a sip of water the morning of the procedure. . Blood thinners: Notify our staff if you are taking any blood thinners. Depending on which one you take, there will be specific instructions on how and when to stop it. . Diabetics on insulin: Notify the staff so that you can be scheduled 1st case in the morning. If your diabetes requires high dose insulin, take only  of your normal insulin dose the morning of the procedure and notify the staff that you have done so. . Preventing infections: Shower with an antibacterial soap the morning of your procedure.  . Build-up your immune system: Take 1000 mg of Vitamin C with every meal (3 times a day) the day prior to your procedure. Marland Kitchen Antibiotics: Inform the staff if you have a condition or reason that requires you to take antibiotics before dental procedures. . Pregnancy: If you are pregnant, call and cancel the procedure. . Sickness: If you have a cold, fever, or any active infections, call and cancel the procedure. . Arrival: You must be in the facility at least 30 minutes prior to your scheduled procedure. . Children: Do not bring any children with you. . Dress appropriately: Bring dark clothing that you would not mind if they get stained. . Valuables: Do not bring any jewelry or valuables.  Reasons to call and reschedule or cancel your procedure: (Following these  recommendations will minimize the risk of a serious complication.) . Surgeries: Avoid having procedures within 2 weeks of any surgery. (Avoid for 2 weeks before or after any surgery). . Flu Shots: Avoid having procedures within 2 weeks of a flu shots or . (Avoid for 2 weeks before or after immunizations). . Barium: Avoid having a procedure within 7-10 days after having had a radiological study involving the use of radiological contrast. (Myelograms, Barium swallow or enema study). . Heart attacks: Avoid any elective procedures or surgeries for the initial 6 months after a "Myocardial Infarction" (Heart Attack). . Blood thinners: It is imperative that you stop these medications before procedures. Let us know if you if you take any blood thinner.  . Infection: Avoid procedures during or within two weeks of an infection (including chest colds or gastrointestinal problems). Symptoms associated with infections include: Localized redness, fever, chills, night sweats or profuse sweating, burning sensation when voiding, cough, congestion, stuffiness, runny nose, sore throat, diarrhea, nausea, vomiting, cold or Flu symptoms, recent or current infections. It is specially important if the infection is over the area that we intend to treat. Marland Kitchen Heart and lung problems: Symptoms that may suggest an active cardiopulmonary problem include: cough, chest pain, breathing difficulties or shortness of breath, dizziness, ankle swelling, uncontrolled high or unusually low blood pressure, and/or palpitations. If you are experiencing any of these symptoms, cancel your procedure and contact your primary care physician for an evaluation.  Remember:  Regular Business hours are:  Monday to Thursday 8:00 AM to 4:00 PM  Provider's Schedule: Milinda Pointer, MD:  Procedure days: Tuesday and Thursday 7:30 AM to 4:00 PM  [place]  Holley Raring, MD:  Procedure days: Monday and Wednesday 7:30 AM to 4:00  PM ____________________________________________________________________________________________

## 2019-08-04 DIAGNOSIS — K4090 Unilateral inguinal hernia, without obstruction or gangrene, not specified as recurrent: Secondary | ICD-10-CM | POA: Insufficient documentation

## 2019-08-19 ENCOUNTER — Telehealth: Payer: Self-pay | Admitting: Pain Medicine

## 2019-08-19 NOTE — Telephone Encounter (Signed)
Attempted to call patient.  Left message for him to call us back.

## 2019-08-19 NOTE — Telephone Encounter (Signed)
Patient Rick Carter asking if he can have a procedure. I looked back on DR. Naveira's last note and it says 16 wks, which is scheduled for procedure. Can he have a procedure sooner than that?

## 2019-08-23 ENCOUNTER — Telehealth: Payer: Self-pay

## 2019-08-23 NOTE — Telephone Encounter (Signed)
Attempted to call patient.  Left message.

## 2019-08-31 ENCOUNTER — Other Ambulatory Visit: Payer: Self-pay

## 2019-08-31 ENCOUNTER — Ambulatory Visit: Payer: Medicare Other | Attending: Pain Medicine | Admitting: Pain Medicine

## 2019-08-31 ENCOUNTER — Encounter: Payer: Self-pay | Admitting: Pain Medicine

## 2019-08-31 VITALS — BP 166/93 | HR 69 | Temp 98.9°F | Resp 22 | Ht 69.0 in | Wt 123.0 lb

## 2019-08-31 DIAGNOSIS — M79645 Pain in left finger(s): Secondary | ICD-10-CM | POA: Diagnosis present

## 2019-08-31 DIAGNOSIS — M65332 Trigger finger, left middle finger: Secondary | ICD-10-CM

## 2019-08-31 MED ORDER — DEXAMETHASONE SODIUM PHOSPHATE 10 MG/ML IJ SOLN
10.0000 mg | Freq: Once | INTRAMUSCULAR | Status: AC
  Administered 2019-08-31: 10 mg
  Filled 2019-08-31: qty 1

## 2019-08-31 MED ORDER — LIDOCAINE HCL 2 % IJ SOLN
5.0000 mL | Freq: Once | INTRAMUSCULAR | Status: AC
  Administered 2019-08-31: 400 mg
  Filled 2019-08-31: qty 40

## 2019-08-31 MED ORDER — ROPIVACAINE HCL 2 MG/ML IJ SOLN
1.0000 mL | Freq: Once | INTRAMUSCULAR | Status: AC
  Administered 2019-08-31: 10 mL
  Filled 2019-08-31: qty 10

## 2019-08-31 NOTE — Patient Instructions (Addendum)

## 2019-08-31 NOTE — Progress Notes (Signed)
Patient's Name: Rick BECKLES Sr.  MRN: [id number]  Referring Provider: [facility], [place]  DOB: 10-Sep-1951  PCP: [facility], [place]  DOS: 08/31/2019  Note by: Gaspar Cola, MD  Service setting: Ambulatory outpatient  Specialty: Interventional Pain Management  Patient type: Established  Location: [facility] (AMB) Pain Management Facility  Visit type: Interventional Procedure   Primary Reason for Visit: Interventional Pain Management Treatment. CC: Hand Pain (left)  Procedure:          Anesthesia, Analgesia, Anxiolysis:  Type: Trigger Finger Ligament/Tendon sheath (20550) Injection.  #4  Purpose: Therapeutic Target Area: Flexor Digitorum Tendon sheath nodule Region: A-1(proximal) pulley of the metacarpal area Approach: Percutaneous Digit: No:3(Middle) Finger Laterality: Left-hand  Type: Local Anesthesia Indication(s): Analgesia         Local Anesthetic: Lidocaine 1-2% Route: Infiltration (Kane/IM) IV Access: Declined Sedation: Declined   Position: Sitting   Indications: 1. Trigger finger (middle finger) (Left)   2. Pain in finger of left hand    Pain Score: Pre-procedure: 8 /10 Post-procedure: 0-No pain/10   Pre-op Assessment:  Rick Carter is a 68 y.o. (year old), male patient, seen today for interventional treatment. He  has a past surgical history that includes Tonsillectomy; Lung removal, partial; and Neck surgery. Rick Carter has a current medication list which includes the following prescription(s): acetaminophen, acetylcysteine, tudorza pressair, albuterol, albuterol, aspirin ec, cholecalciferol, escitalopram, fluoxetine, fluticasone-salmeterol, trelegy ellipta, furosemide, guaifenesin, hydrochlorothiazide, hydroxyzine, ipratropium-albuterol, loratadine, lorazepam, losartan-hydrochlorothiazide, lubiprostone, montelukast, multiple vitamins-minerals, oxycodone, oxycodone, oxycodone, pantoprazole, pregabalin, tizanidine, trazodone, ventolin hfa, advair diskus,  fluticasone-salmeterol, levofloxacin, lubiprostone, pregabalin, tizanidine, and umeclidinium bromide. His primarily concern today is the Hand Pain (left)  Initial Vital Signs:  Pulse/HCG Rate: 70  Temp: 98.9 F (37.2 C) Resp: (!) 22 BP: (!) 183/93 SpO2: 95 %  BMI: Estimated body mass index is 18.16 kg/m as calculated from the following:   Height as of this encounter: _0  (1.753 m).   Weight as of this encounter: 123 lb (55.8 kg).  Risk Assessment: Allergies: Reviewed. He is allergic to atorvastatin and varenicline.  Allergy Precautions: None required Coagulopathies: Reviewed. None identified.  Blood-thinner therapy: None at this time Active Infection(s): Reviewed. None identified. Rick Carter is afebrile  Site Confirmation: Rick Carter was asked to confirm the procedure and laterality before marking the site Procedure checklist: Completed Consent: Before the procedure and under the influence of no sedative(s), amnesic(s), or anxiolytics, the patient was informed of the treatment options, risks and possible complications. To fulfill our ethical and legal obligations, as recommended by the American Medical Association's Code of Ethics, I have informed the patient of my clinical impression; the nature and purpose of the treatment or procedure; the risks, benefits, and possible complications of the intervention; the alternatives, including doing nothing; the risk(s) and benefit(s) of the alternative treatment(s) or procedure(s); and the risk(s) and benefit(s) of doing nothing. The patient was provided information about the general risks and possible complications associated with the procedure. These may include, but are not limited to: failure to achieve desired goals, infection, bleeding, organ or nerve damage, allergic reactions, paralysis, and death. In addition, the patient was informed of those risks and complications associated to the procedure, such as failure to decrease pain;  infection; bleeding; organ or nerve damage with subsequent damage to sensory, motor, and/or autonomic systems, resulting in permanent pain, numbness, and/or weakness of one or several areas of the body; allergic reactions; (i.e.: anaphylactic reaction); and/or death. Furthermore, the patient was informed of those risks and  complications associated with the medications. These include, but are not limited to: allergic reactions (i.e.: anaphylactic or anaphylactoid reaction(s)); adrenal axis suppression; blood sugar elevation that in diabetics may result in ketoacidosis or comma; water retention that in patients with history of congestive heart failure may result in shortness of breath, pulmonary edema, and decompensation with resultant heart failure; weight gain; swelling or edema; medication-induced neural toxicity; particulate matter embolism and blood vessel occlusion with resultant organ, and/or nervous system infarction; and/or aseptic necrosis of one or more joints. Finally, the patient was informed that Medicine is not an exact science; therefore, there is also the possibility of unforeseen or unpredictable risks and/or possible complications that may result in a catastrophic outcome. The patient indicated having understood very clearly. We have given the patient no guarantees and we have made no promises. Enough time was given to the patient to ask questions, all of which were answered to the patient's satisfaction. Rick Carter has indicated that he wanted to continue with the procedure. Attestation: I, the ordering provider, attest that I have discussed with the patient the benefits, risks, side-effects, alternatives, likelihood of achieving goals, and potential problems during recovery for the procedure that I have provided informed consent. Date   Time: 08/31/2019  9:43 AM  Pre-Procedure Preparation:  Monitoring: As per clinic protocol. Respiration, ETCO2, SpO2, BP, heart rate and rhythm monitor  placed and checked for adequate function Safety Precautions: Patient was assessed for positional comfort and pressure points before starting the procedure. Time-out: I initiated and conducted the "Time-out" before starting the procedure, as per protocol. The patient was asked to participate by confirming the accuracy of the "Time Out" information. Verification of the correct person, site, and procedure were performed and confirmed by me, the nursing staff, and the patient. "Time-out" conducted as per Joint Commission's Universal Protocol (UP.01.01.01). Time: 0959  Description of Procedure:          Area Prepped: Entire palmar and dorsal aspect of hand, up to forearm area. Prepping solution: DuraPrep (Iodine Povacrylex [0.7% available iodine] and Isopropyl Alcohol, 74% w/w) Safety Precautions: Aspiration looking for blood return was conducted prior to all injections. At no point did we inject any substances, as a needle was being advanced. No attempts were made at seeking any paresthesias. Safe injection practices and needle disposal techniques used. Medications properly checked for expiration dates. SDV (single dose vial) medications used. Description of the Procedure: Protocol guidelines were followed. The patient was placed in position. The target area was identified and prepped in the usual manner. Skin & deeper tissues infiltrated with local anesthetic. Appropriate time provided for local anesthetics to take effect. The procedure needle was slowly advanced to target area. Proper needle placement secured. Negative aspiration confirmed. Solution injected in intermittent fashion, asking for systemic symptoms every 0.5cc. Needle(s) removed and area cleaned, making sure to leave some prepping solution back to take advantage of its long term bactericidal properties.  Vitals:   08/31/19 0940 08/31/19 0943 08/31/19 1004  BP:  (!) 183/93 (!) 166/93  Pulse: 70  69  Resp: (!) 22  (!) 22  Temp: 98.9 F (37.2  C)    SpO2: 95%  99%  Weight: 123 lb (55.8 kg)    Height: _0  (1.753 m)      Start Time: 0959 hrs. End Time: 1001 hrs. Materials:  Needle(s) Type: Regular needle Gauge: 25G Length: 0.5-in Medication(s): Please see orders for medications and dosing details.    Imaging Guidance:  Type of Imaging Technique: None used Indication(s): N/A Exposure Time: No patient exposure Contrast: None used. Fluoroscopic Guidance: N/A Ultrasound Guidance: N/A Interpretation: N/A  Post-operative Assessment:  Post-procedure Vital Signs:  Pulse/HCG Rate: 69  Temp: 98.9 F (37.2 C) Resp: (!) 22 BP: (!) 166/93 SpO2: 99 %  EBL: None  Complications: No immediate post-treatment complications observed by team, or reported by patient.  Note: The patient tolerated the entire procedure well. A repeat set of vitals were taken after the procedure and the patient was kept under observation following institutional policy, for this type of procedure. Post-procedural neurological assessment was performed, showing return to baseline, prior to discharge. The patient was provided with post-procedure discharge instructions, including a section on how to identify potential problems. Should any problems arise concerning this procedure, the patient was given instructions to immediately contact us, at any time, without hesitation. In any case, we plan to contact the patient by telephone for a follow-up status report regarding this interventional procedure.  Comments:  No additional relevant information.  Plan of Care  Orders:  Orders Placed This Encounter  Procedures   Tendon or Ligament injection (Today)    Scheduling Instructions:     Type of Block:  Trigger Finger injection     Side: Left-sided     Sedation: No Sedation.     Timeframe: Today   Consent:    Nursing Order: Transcribe to consent form and obtain patient signature. Note: Always confirm laterality of pain with Rick Carter, before  procedure. Procedure: Therapeutic left middle finger, trigger finger injection Indication/Reason: Left hand middle finger pain secondary to trigger finger Provider Attestation: I, [place] Dossie Arbour, MD, (Pain Management Specialist), the physician/practitioner, attest that I have discussed with the patient the benefits, risks, side effects, alternatives, likelihood of achieving goals and potential problems during recovery for the procedure that I have provided informed consent.   Block Tray    Equipment required: Single use, disposable, "Block Tray"    Standing Status:   Standing    Number of Occurrences:   1    Order Specific Question:   Specify    Answer:   Block Tray   Chronic Opioid Analgesic:  Oxycodone IR 5 mg, 1 tab PO q 6 hrs (20 mg/day of oxycodone) MME/day:30 mg/day.   Medications ordered for procedure: Meds ordered this encounter  Medications   lidocaine (XYLOCAINE) 2 % (with pres) injection 100 mg   ropivacaine (PF) 2 mg/mL (0.2%) (NAROPIN) injection 1 mL   dexamethasone (DECADRON) injection 10 mg   Medications administered: We administered lidocaine, ropivacaine (PF) 2 mg/mL (0.2%), and dexamethasone.  See the medical record for exact dosing, route, and time of administration.  Follow-up plan:   Return in about 2 weeks (around 09/14/2019) for (VV), (PP).       Interventional management options:  Considering:   Diagnostic right L3 TFESI  Diagnostic right L5 TFESI    Palliative PRN treatment(s):   Palliativebilateral lumbar facet block #2 Palliative left cervical ESI #6  Palliative right T8-9 thoracic ESI #4  Palliative ML T6-7 thoracic ESI #3  Palliative right L5-S1 LESI #2  Palliative right L4-5 LESI #5  Palliative left hand, ring finger (No.:4)(A-4 pulley), trigger finger injection #4  Palliative ML L2-3 interspinous ligament injection #2     Recent Visits Date Type Provider Dept  07/26/19 Office Visit Milinda Pointer, MD [facility]    07/05/19 Office Visit Milinda Pointer, MD [facility]  07/01/19 Procedure visit Milinda Pointer, MD  [facility]  Showing recent visits within past 90 days and meeting all other requirements   Today's Visits Date Type Provider Dept  08/31/19 Procedure visit Milinda Pointer, MD [facility]  Showing today's visits and meeting all other requirements   Future Appointments Date Type Provider Dept  09/20/19 Appointment Milinda Pointer, MD [facility]  11/02/19 Appointment Milinda Pointer, [facility]  11/15/19 Appointment Milinda Pointer, MD [facility]  Showing future appointments within next 90 days and meeting all other requirements   Disposition: Discharge home  Discharge Date & Time: 08/31/2019; 1005 hrs.   Primary Care Physician: [facility], [place] Location: [facility] Note by: Gaspar Cola, MD Date: 08/31/2019; Time: 10:18 AM  Disclaimer:  Medicine is not an exact science. The only guarantee in medicine is that nothing is guaranteed. It is important to note that the decision to proceed with this intervention was based on the information collected from the patient. The Data and conclusions were drawn from the patient's questionnaire, the interview, and the physical examination. Because the information was provided in large part by the patient, it cannot be guaranteed that it has not been purposely or unconsciously manipulated. Every effort has been made to obtain as much relevant data as possible for this evaluation. It is important to note that the conclusions that lead to this procedure are derived in large part from the available data. Always take into account that the treatment will also be dependent on availability of resources and existing treatment guidelines, considered by other Pain Management Practitioners as being common knowledge and practice, at the time of the  intervention. For Medico-Legal purposes, it is also important to point out that variation in procedural techniques and pharmacological choices are the acceptable norm. The indications, contraindications, technique, and results of the above procedure should only be interpreted and judged by a Board-Certified Interventional Pain Specialist with extensive familiarity and expertise in the same exact procedure and technique.

## 2019-08-31 NOTE — Progress Notes (Signed)
Safety precautions to be maintained throughout the outpatient stay will include: orient to surroundings, keep bed in low position, maintain call bell within reach at all times, provide assistance with transfer out of bed and ambulation.

## 2019-09-01 NOTE — Telephone Encounter (Signed)
Pt was called and no problems was reported.

## 2019-09-16 ENCOUNTER — Encounter: Payer: Self-pay | Admitting: Pain Medicine

## 2019-09-16 NOTE — Progress Notes (Signed)
Pain relief after procedure (treated area only): (Questions asked to patient) 1. Starting about 15 minutes after the procedure, and "while the area was still numb" (from the local anesthetics), were you having any of your usual pain "in that area" (the treated area)?  (NOTE: NOT including the discomfort from the needle sticks.) First 1 hour:80 % better. First 4-6 hours: 40 % better. 2. Assuming that it did get numb. How long was the a1 hour. 3. How much better is your pain now, when compared to before the procedure? Current benefit: 60 % better. 4. Can you move better now? Improvement in ROM (Range of Motion): Yes. 5. Can you do more now? Improvement in function: Yes. 4. Did you have any problems with the procedure? Side-effects/Complications: No.

## 2019-09-20 ENCOUNTER — Other Ambulatory Visit: Payer: Self-pay

## 2019-09-20 ENCOUNTER — Ambulatory Visit: Payer: Medicare Other | Attending: Pain Medicine | Admitting: Pain Medicine

## 2019-09-20 DIAGNOSIS — M5441 Lumbago with sciatica, right side: Secondary | ICD-10-CM

## 2019-09-20 DIAGNOSIS — M65332 Trigger finger, left middle finger: Secondary | ICD-10-CM | POA: Diagnosis not present

## 2019-09-20 DIAGNOSIS — G894 Chronic pain syndrome: Secondary | ICD-10-CM

## 2019-09-20 DIAGNOSIS — M7918 Myalgia, other site: Secondary | ICD-10-CM

## 2019-09-20 DIAGNOSIS — M5412 Radiculopathy, cervical region: Secondary | ICD-10-CM

## 2019-09-20 DIAGNOSIS — K5903 Drug induced constipation: Secondary | ICD-10-CM

## 2019-09-20 DIAGNOSIS — M79604 Pain in right leg: Secondary | ICD-10-CM

## 2019-09-20 DIAGNOSIS — T402X5A Adverse effect of other opioids, initial encounter: Secondary | ICD-10-CM

## 2019-09-20 DIAGNOSIS — M792 Neuralgia and neuritis, unspecified: Secondary | ICD-10-CM

## 2019-09-20 DIAGNOSIS — M542 Cervicalgia: Secondary | ICD-10-CM

## 2019-09-20 DIAGNOSIS — G8929 Other chronic pain: Secondary | ICD-10-CM

## 2019-09-20 MED ORDER — OXYCODONE HCL 5 MG PO TABS: 5.0000 mg | ORAL_TABLET | Freq: Four times a day (QID) | ORAL | 0 refills | Status: DC | PRN

## 2019-09-20 MED ORDER — LUBIPROSTONE 8 MCG PO CAPS: 8.0000 ug | ORAL_CAPSULE | Freq: Two times a day (BID) | ORAL | 5 refills | Status: DC

## 2019-09-20 MED ORDER — TIZANIDINE HCL 4 MG PO TABS: 4.0000 mg | ORAL_TABLET | Freq: Three times a day (TID) | ORAL | 5 refills | Status: DC | PRN

## 2019-09-20 MED ORDER — PREGABALIN 150 MG PO CAPS: 150.0000 mg | ORAL_CAPSULE | Freq: Three times a day (TID) | ORAL | 5 refills | Status: DC

## 2019-09-20 NOTE — Progress Notes (Signed)
Pain Management Virtual Encounter Note - Virtual Visit via Telephone Telehealth (real-time audio visits between healthcare provider and patient).   Patient'Carter Phone No. & Preferred Pharmacy:  [phone] (home); [phone] (mobile); (Preferred) [phone] No e-mail address on record  [id number] [place] [address]Phone: [phone]: [phone]   Pre-screening note:  Our staff contacted Mr. CRinerand offered him an "in person", "face-to-face" appointment versus a telephone encounter. He indicated preferring the telephone encounter, at this time.   Reason for Virtual Visit: COVID-19*  Social distancing based on CDC and AMA recommendations.   I contacted Rick PatrickSr. on 09/20/2019 via telephone.      I clearly identified myself as FGaspar Cola MD. I verified that I was speaking with the correct person using two identifiers (Name: Rick PENDRYSr., and date of birth: [birth year].  Advanced Informed Consent I sought verbal advanced consent from JBoonfor virtual visit interactions. I informed [name] possible security and privacy concerns, risks, and limitations associated with providing "not-in-person" medical evaluation and management services. I also informed Mr. CKynardof the availability of "in-person" appointments. Finally, I informed him that there would be a charge for the virtual visit and that he could be  personally, fully or partially, financially responsible for it. Mr. CDeneaultexpressed understanding and agreed to proceed.   Historic Elements   Mr. Rick ARGABRIGHTSr. is a 68y.o. year old, male patient evaluated today after his last encounter by our practice on 08/31/2019. [name] has a past medical history of Anxiety, Bronchitis, Cancer ([place], COPD (chronic obstructive pulmonary disease) ([place], Depression, Hernia of abdominal wall, History of alcoholism ([place]  (08/08/2015), History of pneumonectomy (08/08/2015), Hypertension, Macular degeneration (08/26/2016), and Tumor cells. He also  has a past surgical history that includes Tonsillectomy; Lung removal, partial; and Neck surgery. Mr. CEasomhas a current medication list which includes the following prescription(Carter): acetaminophen, acetylcysteine, tudorza pressair, advair diskus, albuterol, albuterol, aspirin ec, cholecalciferol, escitalopram, fluoxetine, fluticasone-salmeterol, fluticasone-salmeterol, trelegy ellipta, furosemide, guaifenesin, hydrochlorothiazide, hydroxyzine, ipratropium-albuterol, levofloxacin, loratadine, lorazepam, losartan-hydrochlorothiazide, lubiprostone, lubiprostone, montelukast, multiple vitamins-minerals, oxycodone, oxycodone, oxycodone, pantoprazole, pregabalin, pregabalin, tizanidine, trazodone, umeclidinium bromide, and ventolin hfa. He  reports that he quit smoking about 7 months ago. His smoking use included cigarettes. He quit after 51.00 years of use. He has never used smokeless tobacco. He reports that he does not drink alcohol. No history on file for drug. Mr. CSwiderskiis allergic to atorvastatin and varenicline.   HPI  Today, he is being contacted for both, medication management and a post-procedure assessment.  The patient indicates doing well with the current medication regimen. No adverse reactions or side effects reported to the medications.  He is pending to have some radiation therapy for his cancer but he says that he is having difficulty laying flat for that.  He indicates that he would like to have a cervical epidural steroid injection so that he can be able to complete his radiation therapy.  We will make arrangements for this as soon as we can.  Post-Procedure Evaluation  Procedure: Therapeutic left trigger finger injection #4 under no fluoroscopic guidance, no sedation. Pre-procedure pain level:  8/10 Post-procedure: 0/10 (100% relief)  Sedation: None.  WLandis Martins RN  09/16/2019 12:46 PM  Sign when Signing Visit Pain relief after procedure (treated area only): (Questions asked to patient) 1. Starting about 165  minutes after the procedure, and "while the area was still numb" (from the local anesthetics), were you having any of your usual pain "in that area" (the treated area)?  (NOTE: NOT including the discomfort from the needle sticks.) First 1 hour: 80 % better. First 4-6 hours: 40 % better. 2. Assuming that it did get numb. How long was the a1 hour. 3. How much better is your pain now, when compared to before the procedure? Current benefit: 60 % better. 4. Can you move better now? Improvement in ROM (Range of Motion): Yes. 5. Can you do more now? Improvement in function: Yes. 4. Did you have any problems with the procedure? Side-effects/Complications: No.   Current benefits: Defined as benefit that persist at this time.   Analgesia:  >50% relief Function: Rick Carter reports improvement in function ROM: Rick Carter reports improvement in ROM  Pharmacotherapy Assessment  Analgesic: Oxycodone IR 5 mg, 1 tab PO q 6 hrs (20 mg/day of oxycodone) MME/day:30 mg/day.   Monitoring: Pharmacotherapy: No side-effects or adverse reactions reported. [place] PMP: PDMP reviewed during this encounter.       Compliance: No problems identified. Effectiveness: Clinically acceptable. Plan: Refer to "POC".  UDS:  Summary  Date Value Ref Range Status  03/26/2018 FINAL  Final    Comment:    ==================================================================== TOXASSURE SELECT 13 (MW) ==================================================================== Test                             Result       Flag       Units Drug Present and Declared for Prescription Verification   Oxycodone                      740          EXPECTED   ng/mg creat   Oxymorphone                    2200         EXPECTED   ng/mg creat   Noroxycodone                   1517          EXPECTED   ng/mg creat   Noroxymorphone                 623          EXPECTED   ng/mg creat    Sources of oxycodone are scheduled prescription medications.    Oxymorphone, noroxycodone, and noroxymorphone are expected    metabolites of oxycodone. Oxymorphone is also available as a    scheduled prescription medication. ==================================================================== Test                      Result    Flag   Units      Ref Range   Creatinine              35               mg/dL      >=20 ==================================================================== Declared Medications:  The flagging and interpretation on this report are based on the  following declared medications.  Unexpected results may arise from  inaccuracies in the declared medications.  **Note: The testing scope of this panel includes these medications:  Oxycodone (Roxicet)  **Note: The testing scope of this panel does not include following  reported  medications:  Acetaminophen (Roxicet)  Aclidinium (Tudorza Pressair)  Albuterol  Albuterol (Duoneb)  Aspirin (Aspirin 81)  Calcium carbonate (Calcium carbonate/Vitamin D3)  Cholecalciferol  Diphenhydramine (Benadryl)  Docusate (Colace)  Fluticasone (Advair)  Hydrochlorothiazide (Microzide)  Hydroxyzine (Atarax)  Ibuprofen (Advil)  Ipratropium (Duoneb)  Loratadine  Metformin  Methocarbamol  Multivitamin  Pantoprazole (Protonix)  Pregabalin (Lyrica)  Salmeterol (Advair)  Sildenafil (Viagra)  Trazodone  Vitamin D3 (Calcium carbonate/Vitamin D3) ==================================================================== For clinical consultation, please call [phone]. ====================================================================    Laboratory Chemistry Profile (12 mo)  Renal: No results found for requested labs within last 8760 hours.  Lab Results  Component Value Date   GFRAA 104 04/06/2018   GFRNONAA 90 04/06/2018   Hepatic: No  results found for requested labs within last 8760 hours. Lab Results  Component Value Date   AST 18 04/06/2018   ALT 24 01/06/2017   Other: No results found for requested labs within last 8760 hours. Note: Above Lab results reviewed.  Imaging  DG PAIN CLINIC Carter-ARM 1-60 MIN NO REPORT Fluoro was used, but no Radiologist interpretation will be provided.  Please refer to "NOTES" tab for provider progress note.   Assessment  The primary encounter diagnosis was Chronic pain syndrome. Diagnoses of Chronic low back pain (Primary Area of Pain) (midline), Chronic lower extremity pain (Secondary Area of Pain) (Right), Trigger finger (middle finger) (Left), Chronic musculoskeletal pain, Neurogenic pain, Neuropathic pain, Opioid-induced constipation (OIC), Cervicalgia, and Chronic cervical radicular pain (Left) were also pertinent to this visit.  Plan of Care  Problem-specific:  No problem-specific Assessment & Plan notes found for this encounter.  I am having Sr. Pat Patrick Sr. maintain his aspirin EC, traZODone, cholecalciferol, Multiple Vitamins-Minerals (PRESERVISION AREDS 2 PO), loratadine, Advair Diskus, hydrochlorothiazide, ipratropium-albuterol, albuterol, Ventolin HFA, acetylcysteine, guaiFENesin, levofloxacin, FLUoxetine, hydrOXYzine, montelukast, acetaminophen, umeclidinium bromide, albuterol, Fluticasone-Salmeterol, fluticasone-salmeterol, LORazepam, pantoprazole, losartan-hydrochlorothiazide, furosemide, Tudorza Pressair, escitalopram, oxyCODONE, oxyCODONE, pregabalin, lubiprostone, Trelegy Ellipta, tiZANidine, oxyCODONE, pregabalin, and lubiprostone.  Pharmacotherapy (Medications Ordered): Meds ordered this encounter  Medications  . tiZANidine (ZANAFLEX) 4 MG tablet    Sig: Take 1 tablet (4 mg total) by mouth every 8 (eight) hours as needed for muscle spasms.    Dispense:  90 tablet    Refill:  5    Fill one day early if pharmacy is closed on scheduled refill date. May  substitute for generic if available.  Marland Kitchen oxyCODONE (OXY IR/ROXICODONE) 5 MG immediate release tablet    Sig: Take 1 tablet (5 mg total) by mouth every 6 (six) hours as needed for severe pain. Must last 30 days.    Dispense:  120 tablet    Refill:  0    Chronic Pain: STOP Act (Not applicable) Fill 1 day early if closed on refill date. Do not fill until: 11/15/2019. To last until: 12/15/2019. Avoid benzodiazepines within 8 hours of opioids  . pregabalin (LYRICA) 150 MG capsule    Sig: Take 1 capsule (150 mg total) by mouth 3 (three) times daily.    Dispense:  90 capsule    Refill:  5    Fill one day early if pharmacy is closed on scheduled refill date. May substitute for generic if available.  . lubiprostone (AMITIZA) 8 MCG capsule    Sig: Take 1 capsule (8 mcg total) by mouth 2 (two) times daily with a meal. Swallow whole, do not break or chew the medication.    Dispense:  60 capsule    Refill:  5    Do not  place this medication, or any other prescription from our practice, on "Automatic Refill". Patient may have prescription filled one day early if pharmacy is closed on scheduled refill date.   Orders:  Orders Placed This Encounter  Procedures  . CESI (Schedule)    Level(Carter): C7-T1 Laterality: Left-sided Purpose: Palliative Indication(Carter): Radiculitis and cervicalgia associater with cervical degenerative disc disease.    Standing Status:   Future    Standing Expiration Date:   10/21/2019    Scheduling Instructions:     Procedure: Cervical Epidural Steroid Injection/Block     Sedation: With Sedation.     Timeframe: As soon as schedule allows    Order Specific Question:   Where will this procedure be performed?    Answer:   [facility]    Comments:   by Dr. Dossie Arbour   Follow-up plan:   Return in about 3 months (around 12/15/2019) for (VV), (MM), in addition, Procedure (w/ sedation): (Carter) CESI , (ASAP).      Interventional management options:  Considering:   Diagnostic right L3 TFESI   Diagnostic right L5 TFESI    Palliative PRN treatment(Carter):   Palliativebilateral lumbar facet block #2 Palliative left cervical ESI #6  Palliative right T8-9 thoracic ESI #4  Palliative ML T6-7 thoracic ESI #3  Palliative right L5-S1 LESI #2  Palliative right L4-5 LESI #5  Palliative left hand, ring finger (No.:4)(A-4 pulley), trigger finger injection #4  Palliative ML L2-3 interspinous ligament injection #2      Recent Visits Date Type Provider Dept  08/31/19 Procedure visit Milinda Pointer, MD [facility]  07/26/19 Office Visit Milinda Pointer, MD [facility]  07/05/19 Office Visit Milinda Pointer, MD [facility]  07/01/19 Procedure visit Milinda Pointer, MD [facility]  Showing recent visits within past 90 days and meeting all other requirements   Today'Carter Visits Date Type Provider Dept  09/20/19 Telemedicine Milinda Pointer, MD [facility]  Showing today'Carter visits and meeting all other requirements   Future Appointments Date Type Provider Dept  11/02/19 Appointment Milinda Pointer, MD [facility]  11/15/19 Appointment Milinda Pointer, MD [facility]  Showing future appointments within next 90 days and meeting all other requirements   I discussed the assessment and treatment plan with the patient. The patient was provided an opportunity to ask questions and all were answered. The patient agreed with the plan and demonstrated an understanding of the instructions.  Patient advised to call back or seek an in-person evaluation if the symptoms or condition worsens.  Total duration of non-face-to-face encounter: 13 minutes.  Note by: Gaspar Cola, MD Date: 09/20/2019; Time: 1:23 PM  Note: This dictation was prepared with Dragon dictation. Any transcriptional errors that may result from this process are unintentional.  Disclaimer:  * Given the special circumstances of the COVID-19  pandemic, the federal government has announced that the Office for Civil Rights (OCR) will exercise its enforcement discretion and will not impose penalties on physicians using telehealth in the event of noncompliance with regulatory requirements under the [place] (HIPAA) in connection with the good faith provision of telehealth during the EXBMW-41 national public health emergency. ([place])

## 2019-09-28 ENCOUNTER — Ambulatory Visit: Payer: Medicare Other | Admitting: Pain Medicine

## 2019-10-19 ENCOUNTER — Encounter: Payer: Self-pay | Admitting: Pain Medicine

## 2019-10-19 ENCOUNTER — Ambulatory Visit (HOSPITAL_BASED_OUTPATIENT_CLINIC_OR_DEPARTMENT_OTHER): Payer: Medicare Other | Admitting: Pain Medicine

## 2019-10-19 ENCOUNTER — Telehealth: Payer: Self-pay | Admitting: Pain Medicine

## 2019-10-19 ENCOUNTER — Other Ambulatory Visit: Payer: Self-pay

## 2019-10-19 ENCOUNTER — Ambulatory Visit
Admission: RE | Admit: 2019-10-19 | Discharge: 2019-10-19 | Disposition: A | Discharge status: Home / Self Care | Payer: Medicare Other | Source: Ambulatory Visit | Attending: Pain Medicine | Admitting: Pain Medicine

## 2019-10-19 VITALS — BP 188/91 | HR 91 | Temp 98.8°F | Resp 18 | Ht 69.0 in | Wt 123.0 lb

## 2019-10-19 DIAGNOSIS — M961 Postlaminectomy syndrome, not elsewhere classified: Secondary | ICD-10-CM | POA: Insufficient documentation

## 2019-10-19 DIAGNOSIS — G8929 Other chronic pain: Secondary | ICD-10-CM | POA: Insufficient documentation

## 2019-10-19 DIAGNOSIS — F411 Generalized anxiety disorder: Secondary | ICD-10-CM | POA: Diagnosis present

## 2019-10-19 DIAGNOSIS — C3431 Malignant neoplasm of lower lobe, right bronchus or lung: Secondary | ICD-10-CM | POA: Diagnosis present

## 2019-10-19 DIAGNOSIS — Z902 Acquired absence of lung [part of]: Secondary | ICD-10-CM | POA: Insufficient documentation

## 2019-10-19 DIAGNOSIS — R64 Cachexia: Secondary | ICD-10-CM | POA: Insufficient documentation

## 2019-10-19 DIAGNOSIS — M47812 Spondylosis without myelopathy or radiculopathy, cervical region: Secondary | ICD-10-CM | POA: Insufficient documentation

## 2019-10-19 DIAGNOSIS — M542 Cervicalgia: Secondary | ICD-10-CM

## 2019-10-19 DIAGNOSIS — K219 Gastro-esophageal reflux disease without esophagitis: Secondary | ICD-10-CM | POA: Diagnosis present

## 2019-10-19 DIAGNOSIS — M5412 Radiculopathy, cervical region: Secondary | ICD-10-CM | POA: Insufficient documentation

## 2019-10-19 DIAGNOSIS — J439 Emphysema, unspecified: Secondary | ICD-10-CM | POA: Diagnosis present

## 2019-10-19 DIAGNOSIS — M5030 Other cervical disc degeneration, unspecified cervical region: Secondary | ICD-10-CM | POA: Diagnosis present

## 2019-10-19 DIAGNOSIS — R110 Nausea: Secondary | ICD-10-CM | POA: Insufficient documentation

## 2019-10-19 DIAGNOSIS — Z9889 Other specified postprocedural states: Secondary | ICD-10-CM | POA: Diagnosis present

## 2019-10-19 MED ORDER — LIDOCAINE HCL 2 % IJ SOLN
20.0000 mL | Freq: Once | INTRAMUSCULAR | Status: AC
  Administered 2019-10-19: 400 mg
  Filled 2019-10-19: qty 40

## 2019-10-19 MED ORDER — FENTANYL CITRATE (PF) 100 MCG/2ML IJ SOLN
25.0000 ug | INTRAMUSCULAR | Status: DC | PRN
  Administered 2019-10-19: 50 ug via INTRAVENOUS
  Filled 2019-10-19: qty 2

## 2019-10-19 MED ORDER — MIDAZOLAM HCL 5 MG/5ML IJ SOLN
1.0000 mg | INTRAMUSCULAR | Status: DC | PRN
  Administered 2019-10-19: 4 mg via INTRAVENOUS
  Filled 2019-10-19: qty 5

## 2019-10-19 MED ORDER — LACTATED RINGERS IV SOLN
1000.0000 mL | Freq: Once | INTRAVENOUS | Status: AC
  Administered 2019-10-19: 1000 mL via INTRAVENOUS

## 2019-10-19 MED ORDER — ONDANSETRON HCL 4 MG/2ML IJ SOLN
4.0000 mg | Freq: Once | INTRAMUSCULAR | Status: AC
  Administered 2019-10-19: 4 mg via INTRAVENOUS

## 2019-10-19 MED ORDER — DEXAMETHASONE SODIUM PHOSPHATE 10 MG/ML IJ SOLN
10.0000 mg | Freq: Once | INTRAMUSCULAR | Status: AC
  Administered 2019-10-19: 10 mg
  Filled 2019-10-19: qty 1

## 2019-10-19 MED ORDER — ROPIVACAINE HCL 2 MG/ML IJ SOLN
1.0000 mL | Freq: Once | INTRAMUSCULAR | Status: AC
  Administered 2019-10-19: 1 mL via EPIDURAL
  Filled 2019-10-19: qty 10

## 2019-10-19 MED ORDER — IOHEXOL 180 MG/ML  SOLN
10.0000 mL | Freq: Once | INTRAMUSCULAR | Status: AC
  Administered 2019-10-19: 1 mL via EPIDURAL
  Filled 2019-10-19: qty 20

## 2019-10-19 MED ORDER — SODIUM CHLORIDE 0.9% FLUSH
1.0000 mL | Freq: Once | INTRAVENOUS | Status: AC
  Administered 2019-10-19: 1 mL

## 2019-10-19 NOTE — Telephone Encounter (Signed)
Patient just went to pharmacy to pick up his scripts and was told they didn't have one to fill until Feb. Please call pharmacy and find out where his Jan script is. He is at the pharmacy now.

## 2019-10-19 NOTE — Telephone Encounter (Signed)
Spoke with [facility], the problems has already been resolved. No action needed.

## 2019-10-19 NOTE — Patient Instructions (Signed)
____________________________________________________________________________________________  Post-Procedure Discharge Instructions  Instructions:  Apply ice:   Purpose: This will minimize any swelling and discomfort after procedure.   When: Day of procedure, as soon as you get home.  How: Fill a plastic sandwich bag with crushed ice. Cover it with a small towel and apply to injection site.  How long: (15 min on, 15 min off) Apply for 15 minutes then remove x 15 minutes.  Repeat sequence on day of procedure, until you go to bed.  Apply heat:   Purpose: To treat any soreness and discomfort from the procedure.  When: Starting the next day after the procedure.  How: Apply heat to procedure site starting the day following the procedure.  How long: May continue to repeat daily, until discomfort goes away.  Food intake: Start with clear liquids (like water) and advance to regular food, as tolerated.   Physical activities: Keep activities to a minimum for the first 8 hours after the procedure. After that, then as tolerated.  Driving: If you have received any sedation, be responsible and do not drive. You are not allowed to drive for 24 hours after having sedation.  Blood thinner: (Applies only to those taking blood thinners) You may restart your blood thinner 6 hours after your procedure.  Insulin: (Applies only to Diabetic patients taking insulin) As soon as you can eat, you may resume your normal dosing schedule.  Infection prevention: Keep procedure site clean and dry. Shower daily and clean area with soap and water.  Post-procedure Pain Diary: Extremely important that this be done correctly and accurately. Recorded information will be used to determine the next step in treatment. For the purpose of accuracy, follow these rules:  Evaluate only the area treated. Do not report or include pain from an untreated area. For the purpose of this evaluation, ignore all other areas of pain,  except for the treated area.  After your procedure, avoid taking a long nap and attempting to complete the pain diary after you wake up. Instead, set your alarm clock to go off every hour, on the hour, for the initial 8 hours after the procedure. Document the duration of the numbing medicine, and the relief you are getting from it.  Do not go to sleep and attempt to complete it later. It will not be accurate. If you received sedation, it is likely that you were given a medication that may cause amnesia. Because of this, completing the diary at a later time may cause the information to be inaccurate. This information is needed to plan your care.  Follow-up appointment: Keep your post-procedure follow-up evaluation appointment after the procedure (usually 2 weeks for most procedures, 6 weeks for radiofrequencies). DO NOT FORGET to bring you pain diary with you.   Expect: (What should I expect to see with my procedure?)  From numbing medicine (AKA: Local Anesthetics): Numbness or decrease in pain. You may also experience some weakness, which if present, could last for the duration of the local anesthetic.  Onset: Full effect within 15 minutes of injected.  Duration: It will depend on the type of local anesthetic used. On the average, 1 to 8 hours.   From steroids (Applies only if steroids were used): Decrease in swelling or inflammation. Once inflammation is improved, relief of the pain will follow.  Onset of benefits: Depends on the amount of swelling present. The more swelling, the longer it will take for the benefits to be seen. In some cases, up to 10 days.  Duration: Steroids will stay in the system x 2 weeks. Duration of benefits will depend on multiple posibilities including persistent irritating factors.  Side-effects: If present, they may typically last 2 weeks (the duration of the steroids).  Frequent: Cramps (if they occur, drink Gatorade and take over-the-counter Magnesium 450-500 mg  once to twice a day); water retention with temporary weight gain; increases in blood sugar; decreased immune system response; increased appetite.  Occasional: Facial flushing (red, warm cheeks); mood swings; menstrual changes.  Uncommon: Long-term decrease or suppression of natural hormones; bone thinning. (These are more common with higher doses or more frequent use. This is why we prefer that our patients avoid having any injection therapies in other practices.)   Very Rare: Severe mood changes; psychosis; aseptic necrosis.  From procedure: Some discomfort is to be expected once the numbing medicine wears off. This should be minimal if ice and heat are applied as instructed.  Call if: (When should I call?)  You experience numbness and weakness that gets worse with time, as opposed to wearing off.  New onset bowel or bladder incontinence. (Applies only to procedures done in the spine)  Emergency Numbers:  Durning business hours (Monday - Thursday, 8:00 AM - 4:00 PM) (Friday, 9:00 AM - 12:00 Noon): [phone]  After hours: [phone]  NOTE: If you are having a problem and are unable connect with, or to talk to a provider, then go to your nearest urgent care or emergency department. If the problem is serious and urgent, please call 911. ____________________________________________________________________________________________

## 2019-10-19 NOTE — Progress Notes (Addendum)
PROVIDER NOTE: Information contained herein reflects review and annotations entered in association with encounter. Interpretation of such information and data should be left to medically-trained personnel. Information provided to patient can be located elsewhere in the medical record under "Patient Instructions". Document created using STT-dictation technology, any transcriptional errors that may result from process are unintentional.   Patient's Name: Rick SAMEK Sr.  MRN: [id number]  Referring Provider: Milinda Pointer, MD  DOB: 19-Jul-1951  [facility]P: [facility] [place]  DOS: 10/19/2019  Note by: Gaspar Cola, MD  Service setting: Ambulatory outpatient  Specialty: Interventional Pain Management  Patient type: Established  Location: [facility] (AMB) Pain Management Facility  Visit type: Interventional Procedure   Primary Reason for Visit: Interventional Pain Management Treatment. CC: Neck Pain  Procedure:          Anesthesia, Analgesia, Anxiolysis:  Type: Palliative, Inter-Laminar, Cervical Epidural Steroid Injection  #6  Region: Posterior Cervico-thoracic Region Level: C7-T1 Laterality: Left-Sided Paramedial  Type: Moderate (Conscious) Sedation combined with Local Anesthesia Indication(s): Analgesia and Anxiety Route: Intravenous (IV) IV Access: Secured Sedation: Meaningful verbal contact was maintained at all times during the procedure.  The patient did eat 3 crackers approximately 3 hours ago.  According to him this was done to keep himself from getting sick.  In view of this, sedation was provided directly by MD. Local Anesthetic: Lidocaine 1-2%  Position: Prone with head of the table was raised to facilitate breathing.   Indications: 1. Cervicalgia   2. Chronic cervical radicular pain (Left)   3. DDD (degenerative disc disease), cervical   4. Cervical spondylosis   5. Failed cervical surgery syndrome    Pain Score: Pre-procedure: 8 /10 Post-procedure: 8 /10   Pre-op  Assessment:  Rick Carter is a 69 y.o. (year old), male patient, seen today for interventional treatment. He  has a past surgical history that includes Tonsillectomy; Lung removal, partial; and Neck surgery. Mr. Grenda has a current medication list which includes the following prescription(s): fluoxetine, fluticasone-salmeterol, fluticasone-salmeterol, trelegy ellipta, furosemide, guaifenesin, hydrochlorothiazide, hydroxyzine, ipratropium-albuterol, levofloxacin, loratadine, lorazepam, losartan-hydrochlorothiazide, lubiprostone, lubiprostone, montelukast, multiple vitamins-minerals, oxycodone, [START ON 11/15/2019] oxycodone, pantoprazole, pregabalin, pregabalin, tizanidine, trazodone, umeclidinium bromide, ventolin hfa, acetaminophen, acetylcysteine, tudorza pressair, advair diskus, albuterol, albuterol, aspirin ec, cholecalciferol, escitalopram, and oxycodone, and the following Facility-Administered Medications: fentanyl and midazolam. 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  All treatments provided to this patient from here on are basically palliative.  Initial Vital Signs:  Pulse/HCG Rate: 91ECG Heart Rate: 89 Temp: 99 F (37.2 C)(101.2 on arrival) Resp: 18 BP: (!) 166/90 SpO2: 98 %(2 L.([facility]))  BMI: Estimated body mass index is 18.16 kg/m as calculated from the following:   Height as of this encounter: _0  (1.753 m).   Weight as of this encounter: 123 lb (55.8 kg).  Risk Assessment: Allergies: Reviewed. He is allergic to atorvastatin and varenicline.  Allergy Precautions: None required Coagulopathies: Reviewed. None identified.  Blood-thinner therapy: None at this time Active Infection(s): Reviewed. None identified. Rick Carter is afebrile  Site Confirmation: Rick Carter was asked to confirm the procedure and laterality before  marking the site Procedure checklist: Completed Consent: Before the procedure and under the influence of no sedative(s), amnesic(s), or anxiolytics, the patient was informed of the treatment options, risks and possible complications. To fulfill our ethical and legal obligations, as  recommended by the American Medical Association's Code of Ethics, I have informed the patient of my clinical impression; the nature and purpose of the treatment or procedure; the risks, benefits, and possible complications of the intervention; the alternatives, including doing nothing; the risk(s) and benefit(s) of the alternative treatment(s) or procedure(s); and the risk(s) and benefit(s) of doing nothing. The patient was provided information about the general risks and possible complications associated with the procedure. These may include, but are not limited to: failure to achieve desired goals, infection, bleeding, organ or nerve damage, allergic reactions, paralysis, and death. In addition, the patient was informed of those risks and complications associated to Spine-related procedures, such as failure to decrease pain; infection (i.e.: Meningitis, epidural or intraspinal abscess); bleeding (i.e.: epidural hematoma, subarachnoid hemorrhage, or any other type of intraspinal or peri-dural bleeding); organ or nerve damage (i.e.: Any type of peripheral nerve, nerve root, or spinal cord injury) with subsequent damage to sensory, motor, and/or autonomic systems, resulting in permanent pain, numbness, and/or weakness of one or several areas of the body; allergic reactions; (i.e.: anaphylactic reaction); and/or death. Furthermore, the patient was informed of those risks and complications associated with the medications. These include, but are not limited to: allergic reactions (i.e.: anaphylactic or anaphylactoid reaction(s)); adrenal axis suppression; blood sugar elevation that in diabetics may result in ketoacidosis or comma; water  retention that in patients with history of congestive heart failure may result in shortness of breath, pulmonary edema, and decompensation with resultant heart failure; weight gain; swelling or edema; medication-induced neural toxicity; particulate matter embolism and blood vessel occlusion with resultant organ, and/or nervous system infarction; and/or aseptic necrosis of one or more joints. Finally, the patient was informed that Medicine is not an exact science; therefore, there is also the possibility of unforeseen or unpredictable risks and/or possible complications that may result in a catastrophic outcome. The patient indicated having understood very clearly. We have given the patient no guarantees and we have made no promises. Enough time was given to the patient to ask questions, all of which were answered to the patient's satisfaction. Mr. Hubert has indicated that he wanted to continue with the procedure. Attestation: I, the ordering provider, attest that I have discussed with the patient the benefits, risks, side-effects, alternatives, likelihood of achieving goals, and potential problems during recovery for the procedure that I have provided informed consent. Date  Time: 10/19/2019 11:53 AM  Pre-Procedure Preparation:  Monitoring: As per clinic protocol. Respiration, ETCO2, SpO2, BP, heart rate and rhythm monitor placed and checked for adequate function Safety Precautions: Patient was assessed for positional comfort and pressure points before starting the procedure. Time-out: I initiated and conducted the "Time-out" before starting the procedure, as per protocol. The patient was asked to participate by confirming the accuracy of the "Time Out" information. Verification of the correct person, site, and procedure were performed and confirmed by me, the nursing staff, and the patient. "Time-out" conducted as per Joint Commission's Universal Protocol (UP.01.01.01). Time: 1221  Description of  Procedure:          Target Area: For Epidural Steroid injections the target is the interlaminar space, initially targeting the lower border of the superior vertebral body lamina. Approach: Paramedial approach. Area Prepped: Entire PosteriorCervical Region Prepping solution: DuraPrep (Iodine Povacrylex [0.7% available iodine] and Isopropyl Alcohol, 74% w/w) Safety Precautions: Aspiration looking for blood return was conducted prior to all injections. At no point did we inject any substances, as a needle was being advanced. No attempts  were made at seeking any paresthesias. Safe injection practices and needle disposal techniques used. Medications properly checked for expiration dates. SDV (single dose vial) medications used. Description of the Procedure: Protocol guidelines were followed. The procedure needle was introduced through the skin, ipsilateral to the reported pain, and advanced to the target area. Bone was contacted and the needle walked caudad, until the lamina was cleared. The epidural space was identified using "loss-of-resistance technique" with 2-3 ml of PF-NaCl (0.9% NSS), in a 5cc LOR glass syringe. Vitals:   10/19/19 1227 10/19/19 1236 10/19/19 1246 10/19/19 1256  BP: (!) 145/93 (!) 160/112 (!) 178/93 (!) 188/91  Pulse:      Resp: _0 Temp:  99 F (37.2 C)  98.8 F (37.1 C)  TempSrc:  Temporal  Temporal  SpO2: 99% 98% 100% 100%  Weight:      Height:        Start Time: 1221 hrs. End Time: 1227 hrs. Materials:  Needle(s) Type: Epidural needle Gauge: 17G Length: 3.5-in Medication(s): Please see orders for medications and dosing details.  Imaging Guidance (Spinal):          Type of Imaging Technique: Fluoroscopy Guidance (Spinal) Indication(s): Assistance in needle guidance and placement for procedures requiring needle placement in or near specific anatomical locations not easily accessible without such assistance. Exposure Time: Please see nurses notes. Contrast:  Before injecting any contrast, we confirmed that the patient did not have an allergy to iodine, shellfish, or radiological contrast. Once satisfactory needle placement was completed at the desired level, radiological contrast was injected. Contrast injected under live fluoroscopy. No contrast complications. See chart for type and volume of contrast used. Fluoroscopic Guidance: I was personally present during the use of fluoroscopy. "Tunnel Vision Technique" used to obtain the best possible view of the target area. Parallax error corrected before commencing the procedure. "Direction-depth-direction" technique used to introduce the needle under continuous pulsed fluoroscopy. Once target was reached, antero-posterior, oblique, and lateral fluoroscopic projection used confirm needle placement in all planes. Images permanently stored in EMR. Interpretation: I personally interpreted the imaging intraoperatively. Adequate needle placement confirmed in multiple planes. Appropriate spread of contrast into desired area was observed. No evidence of afferent or efferent intravascular uptake. No intrathecal or subarachnoid spread observed. Permanent images saved into the patient's record.  Antibiotic Prophylaxis:   Anti-infectives (From admission, onward)   None     Indication(s): None identified  Post-operative Assessment:  Post-procedure Vital Signs:  Pulse/HCG Rate: 9184 Temp: 98.8 F (37.1 C) Resp: 18 BP: (!) 188/91 SpO2: 100 %  EBL: None  Complications: No immediate post-treatment complications observed by team, or reported by patient.  Note: The patient tolerated the entire procedure well. A repeat set of vitals were taken after the procedure and the patient was kept under observation following institutional policy, for this type of procedure. Post-procedural neurological assessment was performed, showing return to baseline, prior to discharge. The patient was provided with post-procedure discharge  instructions, including a section on how to identify potential problems. Should any problems arise concerning this procedure, the patient was given instructions to immediately contact us, at any time, without hesitation. In any case, we plan to contact the patient by telephone for a follow-up status report regarding this interventional procedure.  Comments:  No additional relevant information.  Plan of Care  Orders:  Orders Placed This Encounter  Procedures  . CESI (Today)    Procedure: Cervical Epidural Steroid Injection/Block Purpose: Palliative Indication(s): Radiculitis and cervicalgia  associater with cervical degenerative disc disease.    Scheduling Instructions:     Level(s): C7-T1     Laterality: TBD     Sedation: Patient's choice.     Timeframe: Today    Order Specific Question:   Where will this procedure be performed?    Answer:   [facility]    Comments:   by Dr. Dossie Arbour  . Fluoro (C-Arm) (<60 min) (No Report)    Intraoperative interpretation by procedural physician at [place].    Standing Status:   Standing    Number of Occurrences:   1    Order Specific Question:   Reason for exam:    Answer:   Assistance in needle guidance and placement for procedures requiring needle placement in or near specific anatomical locations not easily accessible without such assistance.  . Consent: CESI    Nursing Order: Transcribe to consent form and obtain patient signature. Note: Always confirm laterality of pain with Mr. Daft, before procedure.  Procedure: Cervical Epidural Steroid Injection (CESI) under fluoroscopic guidance  Indication/Reason: Cervicalgia (Neck Pain) with or without Cervical Radiculopathy/Radiculitis (Arm/Shoulder Pain, Numbness, and/or weakness), secondary to Cervical and/or Cervicothoracic Degenerative Disc Disease (DDD), with or without Intervertebral Disc Displacement (IVDD).  Provider Attestation: I, [place] Dossie Arbour, MD, (Pain  Management Specialist), the physician/practitioner, attest that I have discussed with the patient the benefits, risks, side effects, alternatives, likelihood of achieving goals and potential problems during recovery for the procedure that I have provided informed consent.  Marland Kitchen Epidural Tray    Equipment required: Single use, disposable, "Epidural Tray" Epidural Catheter: NOT required    Standing Status:   Standing    Number of Occurrences:   1    Order Specific Question:   Specify    Answer:   Epidural Tray  . Monitor O2 SATs    Discontinue once pulse oxymetry is back to normal levels for patient.    Standing Status:   Standing    Number of Occurrences:   1  . Oxygen therapy Mode or (Route): Nasal cannula; Liters Per Minute: 2; Keep 02 saturation: Above 92%    Keep patient on oxygen by [place]. Discontinue once pulse oxymetry is back to normal levels for patient.    Standing Status:   Standing    Number of Occurrences:   1    Order Specific Question:   Mode or (Route)    Answer:   Nasal cannula    Order Specific Question:   Liters Per Minute    Answer:   2    Order Specific Question:   Keep 02 saturation    Answer:   Above 92%   Chronic Opioid Analgesic:  Oxycodone IR 5 mg, 1 tab PO q 6 hrs (20 mg/day of oxycodone) MME/day:30 mg/day.   Medications ordered for procedure: Meds ordered this encounter  Medications  . iohexol (OMNIPAQUE) 180 MG/ML injection 10 mL    Must be Myelogram-compatible. If not available, you may substitute with a water-soluble, non-ionic, hypoallergenic, myelogram-compatible radiological contrast medium.  Marland Kitchen lidocaine (XYLOCAINE) 2 % (with pres) injection 400 mg  . lactated ringers infusion 1,000 mL  . midazolam (VERSED) 5 MG/5ML injection 1-2 mg    Make sure Flumazenil is available in the pyxis when using this medication. If oversedation occurs, administer 0.2 mg IV over 15 sec. If after 45 sec no response, administer 0.2 mg again over 1 min; may repeat at 1 min  intervals; not to exceed 4 doses (  1 mg)  . fentaNYL (SUBLIMAZE) injection 25-50 mcg    Make sure Narcan is available in the pyxis when using this medication. In the event of respiratory depression (RR< 8/min): Titrate NARCAN (naloxone) in increments of 0.1 to 0.2 mg IV at 2-3 minute intervals, until desired degree of reversal.  . sodium chloride flush (NS) 0.9 % injection 1 mL  . ropivacaine (PF) 2 mg/mL (0.2%) (NAROPIN) injection 1 mL  . dexamethasone (DECADRON) injection 10 mg  . ondansetron (ZOFRAN) injection 4 mg   Medications administered: We administered iohexol, lidocaine, lactated ringers, midazolam, fentaNYL, sodium chloride flush, ropivacaine (PF) 2 mg/mL (0.2%), dexamethasone, and ondansetron.  See the medical record for exact dosing, route, and time of administration.  Follow-up plan:   No follow-ups on file.       Interventional management options:  Considering:   Diagnostic right L3 TFESI  Diagnostic right L5 TFESI    Palliative PRN treatment(s):   Palliativebilateral lumbar facet block #2 Palliative left cervical ESI #6  Palliative right T8-9 thoracic ESI #4  Palliative ML T6-7 thoracic ESI #3  Palliative right L5-S1 LESI #2  Palliative right L4-5 LESI #5  Palliative left hand, ring finger (No.:4)(A-4 pulley), trigger finger injection #4  Palliative ML L2-3 interspinous ligament injection #2       Recent Visits Date Type Provider Dept  09/20/19 Telemedicine Milinda Pointer, MD [facility]  08/31/19 Procedure visit Milinda Pointer, MD [facility]  07/26/19 Office Visit Milinda Pointer, MD [facility]  Showing recent visits within past 90 days and meeting all other requirements   Today's Visits Date Type Provider Dept  10/19/19 Procedure visit Milinda Pointer, MD [facility]  Showing today's visits and meeting all other requirements   Future Appointments Date Type Provider Dept  11/01/19 Appointment  Milinda Pointer, MD [facility]  11/02/19 Appointment Milinda Pointer, [facility]  12/15/19 Appointment Milinda Pointer, MD [facility]  Showing future appointments within next 90 days and meeting all other requirements   Disposition: Discharge home  Discharge Date & Time: 10/19/2019; 1305 hrs.   Primary Care Physician: [facility] [place] Location: [facility] Note by: Gaspar Cola, MD Date: 10/19/2019; Time: 3:07 PM  Disclaimer:  Medicine is not an Chief Strategy Officer. The only guarantee in medicine is that nothing is guaranteed. It is important to note that the decision to proceed with this intervention was based on the information collected from the patient. The Data and conclusions were drawn from the patient's questionnaire, the interview, and the physical examination. Because the information was provided in large part by the patient, it cannot be guaranteed that it has not been purposely or unconsciously manipulated. Every effort has been made to obtain as much relevant data as possible for this evaluation. It is important to note that the conclusions that lead to this procedure are derived in large part from the available data. Always take into account that the treatment will also be dependent on availability of resources and existing treatment guidelines, considered by other Pain Management Practitioners as being common knowledge and practice, at the time of the intervention. For Medico-Legal purposes, it is also important to point out that variation in procedural techniques and pharmacological choices are the acceptable norm. The indications, contraindications, technique, and results of the above procedure should only be interpreted and judged by a Board-Certified Interventional Pain Specialist with extensive familiarity and expertise in the same exact procedure and technique.

## 2019-10-19 NOTE — Progress Notes (Signed)
Safety precautions to be maintained throughout the outpatient stay will include: orient to surroundings, keep bed in low position, maintain call bell within reach at all times, provide assistance with transfer out of bed and ambulation.

## 2019-10-20 ENCOUNTER — Telehealth: Payer: Self-pay

## 2019-10-20 NOTE — Telephone Encounter (Addendum)
Post procedure phone call.  The mailbox is full.

## 2019-10-20 NOTE — Telephone Encounter (Signed)
Mr. Berry,           Returned the call stating that he's doing fine, and it's about 80% better.                                         Thanks

## 2019-10-28 ENCOUNTER — Telehealth: Payer: Self-pay | Admitting: *Deleted

## 2019-10-28 NOTE — Progress Notes (Signed)
Pain relief after procedure (treated area only): (Questions asked to patient) 1. Starting about 15 minutes after the procedure, and "while the area was still numb" (from the local anesthetics), were you having any of your usual pain "in that area" (the treated area)?  (NOTE: NOT including the discomfort from the needle sticks.) First 1 hour: 100 % better. First 4-6 hours: 100 % better. 2. How long did the numbness from the local anesthetics last? (More than 6 hours?) Duration: 8 hours.  3. How much better is your pain now, when compared to before the procedure? Current benefit: 70 % better. 4. Can you move better now? Improvement in ROM (Range of Motion): Yes. 5. Can you do more now? Improvement in function: Yes. 4. Did you have any problems with the procedure? Side-effects/Complications: No.

## 2019-10-28 NOTE — Telephone Encounter (Signed)
Attempted to call for pre appointment review of allergies/meds. No answer, mailbox is full.

## 2019-10-31 NOTE — Progress Notes (Signed)
Patient: Rick Patrick Sr.  Service Category: E/M  Provider: Gaspar Cola, MD  DOB: 1950/10/25  DOS: 11/01/2019  Location: Office  MRN: [id number]  Setting: Ambulatory outpatient  Referring Provider: [facility]  Type: Established Patient  Specialty: Interventional Pain Management  PCP: [facility], [place]  Location: Remote location  Delivery: TeleHealth     Virtual Encounter - Pain Management PROVIDER NOTE: Information contained herein reflects review and annotations entered in association with encounter. Interpretation of such information and data should be left to medically-trained personnel. Information provided to patient can be located elsewhere in the medical record under "Patient Instructions". Document created using STT-dictation technology, any transcriptional errors that may result from process are unintentional.    Contact & Pharmacy Preferred: [phone] Home: [phone] (home) Mobile: [phone] (mobile) E-mail: No e-mail address on record  [id number] [place] [id number]: [phone]: [phone]  Pre-screening  Mr. CBoguszoffered "in-person" vs "virtual" encounter. He indicated preferring virtual for this encounter.   Reason COVID-19*  Social distancing based on CDC and AMA recommendations.   I contacted Rick PatrickSr. on 11/01/2019 via telephone.      I clearly identified myself as FGaspar Cola MD. I verified that I was speaking with the correct person using two identifiers (Name: Rick NEELYSr., and date of birth: 71/20/1952.  Consent I sought verbal advanced consent from JKetchumfor virtual visit interactions. I informed Mr. CSadlerof possible security and privacy concerns, risks, and limitations associated with providing "not-in-person" medical evaluation and management services. I also informed Mr. CHippeof the availability of "in-person"  appointments. Finally, I informed him that there would be a charge for the virtual visit and that he could be  personally, fully or partially, financially responsible for it. Mr. CMorandiexpressed understanding and agreed to proceed.   Historic Elements   [name] is a 69y.o. year old, male patient evaluated today after his last encounter by our practice on 10/28/2019. Mr. CLady has a past medical history of Anxiety, Bronchitis, [place] ([place], [place] obstructive [place] disease[place], Depression, Hernia of abdominal wall, [place] of [place] ([place]), History of pneumonectomy (08/08/2015), Hypertension, Macular degeneration (08/26/2016), and Tumor cells. He also  has a past surgical history that includes Tonsillectomy; Lung removal, partial; and Neck surgery. Mr. CButteryhas a current medication list which includes the following prescription(s): acetaminophen, acetylcysteine, tudorza pressair, advair diskus, albuterol, albuterol, aspirin ec, cholecalciferol, escitalopram, fluoxetine, fluticasone-salmeterol, fluticasone-salmeterol, trelegy ellipta, furosemide, guaifenesin, hydrochlorothiazide, hydroxyzine, ipratropium-albuterol, levofloxacin, loratadine, lorazepam, losartan-hydrochlorothiazide, lubiprostone, lubiprostone, montelukast, multiple vitamins-minerals, oxycodone, oxycodone, pantoprazole, pregabalin, pregabalin, tizanidine, trazodone, umeclidinium bromide, and ventolin hfa. He  reports that he quit smoking about 9 months ago. His smoking use included cigarettes. He quit after 51.00 years of use. He has never used smokeless tobacco. He reports that he does not drink alcohol. No history on file for drug. Rick Carter allergic to atorvastatin and varenicline.   HPI  Today, he is being contacted for both, medication management and a post-procedure assessment.  Today I called Rick Carter([phone], Rick Carter's palliative care nurse.  She indicates that he has been  having a lot of pain that he describes as a 9/10.  She also indicates that when he takes the pain medicine he describes his pain as going down to a 2/10, but then he reports that  it only last about an hour and then is back.  Clearly this patient needs a long-acting pain medication and since he is already taking the oxycodone IR 20 mg/day regularly, I will switch this to OxyContin 10 mg (oxycodone ER) 1 tablet p.o. twice daily, which will be the equivalent of what he is currently taking.  I will then give him an additional prescription for the oxycodone IR 5 mg which I will instruct him to take every 4 hours as needed for breakthrough pain.  His pain has changed significantly since he was diagnosed as having cancer.  At this point this is palliative care and I simply want him to be comfortable since she was already informed that he is terminal.  Rick Carter has been nice enough to accept being my eyes at the palliative care facility.  I have informed her of the plan and she will be monitoring him for side effects as well as benefits.  I have requested that she call me at any time should there be any problems, but in addition to that I asked her to give me a call on 11/29/2019, before his next appointment so that I can get some feedback and compared to what he will be telling me.  Rick Carter is an extremely nice individual, but he is very stoic and tends to underplay and under report his pain.  According to him, he did get excellent benefit from the cervical epidural steroid injection, which he usually does, unfortunately the cancer and the cancer pain is not helping at all.  Post-Procedure Evaluation  Procedure: Palliative left cervical epidural steroid injection #6 under fluoroscopic guidance and IV sedation Pre-procedure pain level:  8/10 Post-procedure: 8/10 No relief  Sedation: Please see nurses note.  Pain relief after procedure (treated area only): (Questions asked to patient) 1. Starting about 15  minutes after the procedure, and "while the area was still numb" (from the local anesthetics), were you having any of your usual pain "in that area" (the treated area)?  (NOTE: NOT including the discomfort from the needle sticks.) First 1 hour: 100 % better. First 4-6 hours: 100 % better. 2. How long did the numbness from the local anesthetics last? (More than 6 hours?) Duration: 8 hours.  3. How much better is your pain now, when compared to before the procedure? Current benefit: 70 % better. 4. Can you move better now? Improvement in ROM (Range of Motion): Yes. 5. Can you do more now? Improvement in function: Yes. 4. Did you have any problems with the procedure? Side-effects/Complications: No. Current benefits: Defined as benefit that persist at this time.   Analgesia:  >50% relief Function: Rick Carter reports improvement in function ROM: Rick Carter reports improvement in ROM  Pharmacotherapy Assessment  Analgesic: Oxycodone IR 5 mg, 1 tab PO q 6 hrs (20 mg/day of oxycodone) MME/day:30 mg/day.   Monitoring: Pharmacotherapy: No side-effects or adverse reactions reported. [place] PMP: PDMP reviewed during this encounter.       Compliance: No problems identified. Effectiveness: Clinically acceptable. Plan: Refer to "POC".  UDS:  Summary  Date Value Ref Range Status  03/26/2018 FINAL  Final    Comment:    ==================================================================== TOXASSURE SELECT 13 (MW) ==================================================================== Test                             Result       Flag       Units Drug Present and  Declared for Prescription Verification   Oxycodone                      740          EXPECTED   ng/mg creat   Oxymorphone                    2200         EXPECTED   ng/mg creat   Noroxycodone                   1517         EXPECTED   ng/mg creat   Noroxymorphone                 623          EXPECTED   ng/mg creat    Sources of oxycodone  are scheduled prescription medications.    Oxymorphone, noroxycodone, and noroxymorphone are expected    metabolites of oxycodone. Oxymorphone is also available as a    scheduled prescription medication. ==================================================================== Test                      Result    Flag   Units      Ref Range   Creatinine              35               mg/dL      >=20 ==================================================================== Declared Medications:  The flagging and interpretation on this report are based on the  following declared medications.  Unexpected results may arise from  inaccuracies in the declared medications.  **Note: The testing scope of this panel includes these medications:  Oxycodone (Roxicet)  **Note: The testing scope of this panel does not include following  reported medications:  Acetaminophen (Roxicet)  Aclidinium (Tudorza Pressair)  Albuterol  Albuterol (Duoneb)  Aspirin (Aspirin 81)  Calcium carbonate (Calcium carbonate/Vitamin D3)  Cholecalciferol  Diphenhydramine (Benadryl)  Docusate (Colace)  Fluticasone (Advair)  Hydrochlorothiazide (Microzide)  Hydroxyzine (Atarax)  Ibuprofen (Advil)  Ipratropium (Duoneb)  Loratadine  Metformin  Methocarbamol  Multivitamin  Pantoprazole (Protonix)  Pregabalin (Lyrica)  Salmeterol (Advair)  Sildenafil (Viagra)  Trazodone  Vitamin D3 (Calcium carbonate/Vitamin D3) ==================================================================== For clinical consultation, please call [phone]. ====================================================================    Laboratory Chemistry Profile (12 mo)  Renal: No results found for requested labs within last 8760 hours.  Lab Results  Component Value Date   GFRAA 104 04/06/2018   GFRNONAA 90 04/06/2018   Hepatic: No results found for requested labs within last 8760 hours. Lab Results  Component Value Date   AST 18 04/06/2018   ALT  24 01/06/2017   Other: No results found for requested labs within last 8760 hours. Note: Above Lab results reviewed.  Imaging  Fluoro (C-Arm) (<60 min) (No Report) Fluoro was used, but no Radiologist interpretation will be provided.  Please refer to "NOTES" tab for provider progress note.   Assessment  The primary encounter diagnosis was Cancer-related pain. Diagnoses of Chronic pain syndrome, Failed cervical surgery syndrome, Cervicalgia, Chronic cervical radicular pain (Left), Failed back surgical syndrome, and Chronic low back pain (Primary Area of Pain) (midline) were also pertinent to this visit.  Plan of Care  Problem-specific:  No problem-specific Assessment & Plan notes found for this encounter.  I have discontinued Sr. Louellen Molder. Kawecki Sr.'s oxyCODONE and oxyCODONE. I have also changed his oxyCODONE. Additionally,  I am having him start on oxyCODONE. Lastly, I am having him maintain his aspirin EC, traZODone, cholecalciferol, Multiple Vitamins-Minerals (PRESERVISION AREDS 2 PO), loratadine, Advair Diskus, hydrochlorothiazide, ipratropium-albuterol, albuterol, Ventolin HFA, acetylcysteine, guaiFENesin, levofloxacin, FLUoxetine, hydrOXYzine, montelukast, acetaminophen, umeclidinium bromide, albuterol, Fluticasone-Salmeterol, fluticasone-salmeterol, LORazepam, pantoprazole, losartan-hydrochlorothiazide, furosemide, Tudorza Pressair, escitalopram, pregabalin, lubiprostone, Trelegy Ellipta, tiZANidine, pregabalin, and lubiprostone.  Pharmacotherapy (Medications Ordered): Meds ordered this encounter  Medications  . oxyCODONE (OXY IR/ROXICODONE) 5 MG immediate release tablet    Sig: Take 1 tablet (5 mg total) by mouth every 4 (four) hours as needed for severe pain or breakthrough pain. Must last 30 days.    Dispense:  180 tablet    Refill:  0    Chronic Pain: STOP Act (Not applicable) Fill 1 day early if closed on refill date. Do not fill until: 11/01/2019. To last until: 12/01/2019. Avoid  benzodiazepines within 8 hours of opioids  . oxyCODONE (OXYCONTIN) 10 mg 12 hr tablet    Sig: Take 1 tablet (10 mg total) by mouth every 12 (twelve) hours. Must last 30 days.    Dispense:  60 tablet    Refill:  0    Chronic Pain: STOP Act (Not applicable) Fill 1 day early if closed on refill date. Do not fill until: 11/01/2019. To last until: 12/01/2019. Avoid benzodiazepines within 8 hours of opioids   Orders:  No orders of the defined types were placed in this encounter.  Follow-up plan:   Return in about 30 days (around 12/01/2019) for (VV), (MM) to evaluate the changes in his medication regiment.      Interventional management options:  Considering:   Diagnostic right L3 TFESI  Diagnostic right L5 TFESI    Palliative PRN treatment(s):   Palliativebilateral lumbar facet block #2 Palliative left cervical ESI #7  Palliative right T8-9 thoracic ESI #4  Palliative ML T6-7 thoracic ESI #3  Palliative right L5-S1 LESI #2  Palliative right L4-5 LESI #5  Palliative left hand, ring finger (No.:4)(A-4 pulley), trigger finger injection #4  Palliative ML L2-3 interspinous ligament injection #2     Recent Visits Date Type Provider Dept  10/19/19 Procedure visit Milinda Pointer, MD [facility]  09/20/19 Telemedicine Milinda Pointer, MD [facility]  08/31/19 Procedure visit Milinda Pointer, MD [facility]  Showing recent visits within past 90 days and meeting all other requirements   Today's Visits Date Type Provider Dept  11/01/19 Telemedicine Milinda Pointer, MD [facility]  Showing today's visits and meeting all other requirements   Future Appointments Date Type Provider Dept  11/16/19 Appointment Milinda Pointer, [facility]  12/15/19 Appointment Milinda Pointer, MD [facility]  Showing future appointments within next 90 days and meeting all other requirements   I discussed the assessment and  treatment plan with the patient. The patient was provided an opportunity to ask questions and all were answered. The patient agreed with the plan and demonstrated an understanding of the instructions.  Patient advised to call back or seek an in-person evaluation if the symptoms or condition worsens.  Total duration of non-face-to-face encounter: 16 minutes.  Note by: Gaspar Cola, MD Date: 11/01/2019; Time: 2:07 PM

## 2019-11-01 ENCOUNTER — Telehealth: Payer: Self-pay | Admitting: *Deleted

## 2019-11-01 ENCOUNTER — Ambulatory Visit: Payer: Medicare Other | Attending: Pain Medicine | Admitting: Pain Medicine

## 2019-11-01 ENCOUNTER — Other Ambulatory Visit: Payer: Self-pay

## 2019-11-01 ENCOUNTER — Telehealth: Payer: Self-pay | Admitting: Pain Medicine

## 2019-11-01 DIAGNOSIS — M5441 Lumbago with sciatica, right side: Secondary | ICD-10-CM

## 2019-11-01 DIAGNOSIS — G8929 Other chronic pain: Secondary | ICD-10-CM

## 2019-11-01 DIAGNOSIS — M542 Cervicalgia: Secondary | ICD-10-CM | POA: Diagnosis not present

## 2019-11-01 DIAGNOSIS — G893 Neoplasm related pain (acute) (chronic): Secondary | ICD-10-CM | POA: Diagnosis not present

## 2019-11-01 DIAGNOSIS — M961 Postlaminectomy syndrome, not elsewhere classified: Secondary | ICD-10-CM | POA: Diagnosis not present

## 2019-11-01 DIAGNOSIS — M5412 Radiculopathy, cervical region: Secondary | ICD-10-CM

## 2019-11-01 DIAGNOSIS — G894 Chronic pain syndrome: Secondary | ICD-10-CM

## 2019-11-01 MED ORDER — OXYCODONE HCL ER 10 MG PO T12A: 10.0000 mg | EXTENDED_RELEASE_TABLET | Freq: Two times a day (BID) | ORAL | 0 refills | Status: DC

## 2019-11-01 MED ORDER — OXYCODONE HCL 5 MG PO TABS: 5.0000 mg | ORAL_TABLET | ORAL | 0 refills | Status: DC | PRN

## 2019-11-01 NOTE — Telephone Encounter (Signed)
[name] [phone] care giver for Rick Carter would like to speak with nurse regarding patient care and condition. Please call

## 2019-11-01 NOTE — Patient Instructions (Signed)
____________________________________________________________________________________________  Medication Evaluation  Purpose: The purpose of these questions is to establish the onset of effects (speed of absorption), peak benefit (effectiveness), side-effects (ability to tolerate), and duration (excretion/metabolism) of the prescribed medication. The results will help the healthcare provider decide what to do with the medication in question.  Please indicate:  1. Time to onset of benefits: The amount of time it takes for you to begin perceiving any benefits after taking (swallowing) your pain medicine. _______ minutes.  2. Time to peak effect: The time it takes between taking medicine (swallowing it) and the moment when you feel the most benefit (peak effect) from the medicine. _______ minutes.  3. Peak benefit: Please quantify the amount of relief you obtain from the medicine at the moment it is working the best. Does you pain completely go away (100% gone)? Is it three quarters (3/4) better (75% relief)? Does half of your pain go away (50% benefit)? Is it a third better (33% improved)? Or does it go down only by a fourth (25% better)? _______ % relief.  4. Duration of benefit: The time it takes for all benefits of the medicine to be gone. This period of time starts when you first swallow your pain pill and ends when you feel that your pain has increased to the point where you absolutely need to take another pill. _______ hours and _______ minutes.  5. Adverse reactions: These are side effects and/or adverse reactions you experience since taking the medicine or only when taking your pain medicine. Please circle any and all that apply:  a. Allergic reactions: itching; hives; generalized redness; swelling of the tongue; swelling of the eyes; difficulty breathing. b. Neurological Intolerance: cognitive impairment (difficulty thinking clearly; memory problems; difficulty remembering things; slurred  speech); oversedation; sleepiness: unsteadiness; difficulty walking. c. Gastrointestinal problems: constipation; nausea; vomiting; dry heaves; decreased appetite. d. Hormonal problems: decreased sex drive; erection problems; abnormal menstrual period; weight gain or inability to lose weight; weakness or low energy; hair loss.  What to do: When completed, please bring back to your next visit. Please give it to the admitting nurse.  (Last updated: 02/25/2019) ____________________________________________________________________________________________

## 2019-11-01 NOTE — Telephone Encounter (Signed)
Spoke with Dr Dossie Arbour and gave him [name] phone number and he states he will call her.

## 2019-11-02 ENCOUNTER — Telehealth: Payer: Self-pay | Admitting: *Deleted

## 2019-11-02 ENCOUNTER — Ambulatory Visit: Payer: Medicare Other | Admitting: Pain Medicine

## 2019-11-02 NOTE — Telephone Encounter (Signed)
Spoke with [facility].  Attempting to push through the PA.  They would like the patient to have tried and failed Fentanyl patch or Morphine long acting medications but will try to authorize the oxycontin.  They will let us know as soon as possible.

## 2019-11-02 NOTE — Telephone Encounter (Signed)
Attempted to call [place] but the number would not go through.

## 2019-11-10 NOTE — Telephone Encounter (Signed)
Marland Kitchen

## 2019-11-15 ENCOUNTER — Telehealth: Payer: Medicare Other | Admitting: Pain Medicine

## 2019-11-16 ENCOUNTER — Ambulatory Visit: Payer: Medicare Other | Admitting: Pain Medicine

## 2019-11-29 ENCOUNTER — Telehealth: Payer: Self-pay

## 2019-11-29 NOTE — Telephone Encounter (Signed)
Received a call from Rick Bridge, NP for [facility])  care concerning Rick Carter.  She wanted to talk with Rick Carter about the patients pain control.  She states that the patient is taking Oxycontin 10 mg bid at 5am and 5pm daily.  He is also taking  Oxycodone 5 mg Q4hours prn.  States that patient wakes up at 4am every morning and is in severe pain until 7am.  States that she calls the patient every day and does a pain assessment with him.  She states that he has no adverse effects from the pain medication, no excess drowsiness, constipation or any other issues.  She would like to discuss possibly increasing the Oxycontin to tid instead of bid.  She thinks this may help him maintain his pain level of 3 that he has throughout the day until it reaches 7/10 at 4am. The patient has a virtual appointment on Wednesday.  Please call the NP Rick Carter to discuss the plan of care prior to the patients appointment if possible.  If not, I would be glad to call her and let her know your plan.  Thank you.

## 2019-11-30 ENCOUNTER — Encounter: Payer: Self-pay | Admitting: Pain Medicine

## 2019-11-30 NOTE — Progress Notes (Signed)
Patient: Rick Patrick Sr.  Service Category: E/M  Provider: Gaspar Cola, MD  DOB: [birth year]  DOS: 12/01/2019  Location: Office  MRN: [id number]  Setting: Ambulatory outpatient  Referring Provider: [facility] [place]  Type: Established Patient  Specialty: Interventional Pain Management  PCP: [facility] [place]  Location: Remote location  Delivery: TeleHealth     Virtual Encounter - Pain Management PROVIDER NOTE: Information contained herein reflects review and annotations entered in association with encounter. Interpretation of such information and data should be left to medically-trained personnel. Information provided to patient can be located elsewhere in the medical record under "Patient Instructions". Document created using STT-dictation technology, any transcriptional errors that may result from process are unintentional.    Contact & Pharmacy Preferred: [phone] Home: [phone] (home) Mobile: [phone] (mobile) E-mail: No e-mail address on record  [id number] [place] [address]Phone: [phone]: [phone]  Pre-screening  Mr. CGandolfooffered "in-person" vs "virtual" encounter. He indicated preferring virtual for this encounter.   Reason COVID-19*  Social distancing based on CDC and AMA recommendations.   I contacted [name] on 12/01/2019 via telephone.      I clearly identified myself as FGaspar Cola MD. I verified that I was speaking with the correct person using two identifiers (Name: Rick GALEASr., and date of birth: 1December 13, 1952.  Consent I sought verbal advanced consent from [place]for virtual visit interactions. I informed Mr. CMcwherterof possible security and privacy concerns, risks, and limitations associated with providing "not-in-person" medical evaluation and management services. I also informed Mr. [name] "in-person"  appointments. Finally, I informed him that there would be a charge for the virtual visit and that he could be  personally, fully or partially, financially responsible for it. Mr. CChiarelliexpressed understanding and agreed to proceed.   Historic Elements   Mr. [name] is a 69y.o. year old, male patient evaluated today after his last contact with our practice on 11/29/2019. Rick Carter has a past medical history of Anxiety, Bronchitis, Cancer ([place], COPD (chronic obstructive pulmonary disease) ([place], Depression, Hernia of abdominal wall, History of alcoholism ([place] (08/08/2015), History of pneumonectomy (08/08/2015), Hypertension, Macular degeneration (08/26/2016), and Tumor cells. He also  has a past surgical history that includes Tonsillectomy; Lung removal, partial; and Neck surgery. Mr. [name] a current medication list which includes the following prescription(s): acetaminophen, acetylcysteine, tudorza pressair, advair diskus, albuterol, albuterol, aspirin ec, cholecalciferol, escitalopram, fluoxetine, fluticasone-salmeterol, fluticasone-salmeterol, trelegy ellipta, furosemide, guaifenesin, hydrochlorothiazide, hydroxyzine, ipratropium-albuterol, levofloxacin, loratadine, lorazepam, losartan-hydrochlorothiazide, lubiprostone, lubiprostone, montelukast, multiple vitamins-minerals, oxycodone, pantoprazole, pregabalin, pregabalin, tizanidine, trazodone, umeclidinium bromide, ventolin hfa, and oxycodone. He  reports that he quit smoking about 10 months ago. His smoking use included cigarettes. He quit after 51.00 years of use. He has never used smokeless tobacco. He reports that he does not drink alcohol. No history on file for drug. Mr. [name] allergic to atorvastatin and varenicline.   HPI  Today, he is being contacted for medication management. Diagnosed with terminal cancer. Palliative care. With Oxycodone ER 10 mg, 1 tab PO BID (20 mg/day of oxycodone) + Oxycodone IR 5 mg, 1 tab PO q 4 hrs  PRN for breakthrough pain (30 mg/day of oxycodone), he was reporting pain of 3-4/10 during the day and waking up at 4:00 AM with 7-10/10 and not able to go back  to sleep. Oxycodone ER was being given at 5:00 am and 5:00 pm. Oxycodone IR being taken q 4 hrs around the clock for a constant 75 MME/day. Today we will change regimen to: Oxycodone ER 15 mg, 1 tab PO TID (45 mg/day of oxycodone) + Oxycodone IR 5 mg, 1 tab PO q 4 hrs PRN for breakthrough pain (30 mg/day of oxycodone) for a possible total MME between 67.5 and 112.5 MME/day. I will again follow in 30 days for further adjustments.  Pharmacotherapy Assessment  Analgesic: Oxycodone ER 10 mg, 1 tab PO BID (20 mg/day of oxycodone) + Oxycodone IR 5 mg, 1 tab PO q 4 hrs PRN for breakthrough pain (30 mg/day of oxycodone). Today I will increase it to: Oxycodone ER 15 mg, 1 tab PO TID (45 mg/day of oxycodone) + Oxycodone IR 5 mg, 1 tab PO q 4 hrs PRN for breakthrough pain (30 mg/day of oxycodone) for a possible total MME between 67.5 and 112.5 MME/day MME/day:75 mg/day. Today I will increase it to a range between 67.5-112.5 MME/day.   Monitoring: [place] PMP: PDMP reviewed during this encounter.       Pharmacotherapy: No side-effects or adverse reactions reported. Compliance: No problems identified. Effectiveness: Clinically acceptable. Plan: Refer to "POC".  UDS:  Summary  Date Value Ref Range Status  03/26/2018 FINAL  Final    Comment:    ==================================================================== TOXASSURE SELECT 13 (MW) ==================================================================== Test                             Result       Flag       Units Drug Present and Declared for Prescription Verification   Oxycodone                      740          EXPECTED   ng/mg creat   Oxymorphone                    2200         EXPECTED   ng/mg creat   Noroxycodone                   1517         EXPECTED   ng/mg creat   Noroxymorphone                  623          EXPECTED   ng/mg creat    Sources of oxycodone are scheduled prescription medications.    Oxymorphone, noroxycodone, and noroxymorphone are expected    metabolites of oxycodone. Oxymorphone is also available as a    scheduled prescription medication. ==================================================================== Test                      Result    Flag   Units      Ref Range   Creatinine              35               mg/dL      >=20 ==================================================================== Declared Medications:  The flagging and interpretation on this report are based on the  following declared medications.  Unexpected results may arise from  inaccuracies in the declared medications.  **Note: The testing scope of this panel includes these medications:  Oxycodone (Roxicet)  **Note: The  testing scope of this panel does not include following  reported medications:  Acetaminophen (Roxicet)  Aclidinium (Tudorza Pressair)  Albuterol  Albuterol (Duoneb)  Aspirin (Aspirin 81)  Calcium carbonate (Calcium carbonate/Vitamin D3)  Cholecalciferol  Diphenhydramine (Benadryl)  Docusate (Colace)  Fluticasone (Advair)  Hydrochlorothiazide (Microzide)  Hydroxyzine (Atarax)  Ibuprofen (Advil)  Ipratropium (Duoneb)  Loratadine  Metformin  Methocarbamol  Multivitamin  Pantoprazole (Protonix)  Pregabalin (Lyrica)  Salmeterol (Advair)  Sildenafil (Viagra)  Trazodone  Vitamin D3 (Calcium carbonate/Vitamin D3) ==================================================================== For clinical consultation, please call [phone]. ====================================================================    Laboratory Chemistry Profile   Renal Lab Results  Component Value Date   BUN 9 04/06/2018   CREATININE 0.84 04/06/2018   BCR 11 04/06/2018   GFRAA 104 04/06/2018   GFRNONAA 90 04/06/2018    Hepatic Lab Results  Component Value Date   AST 18  04/06/2018   ALT 24 01/06/2017   ALBUMIN 4.6 04/06/2018   ALKPHOS 64 04/06/2018    Electrolytes Lab Results  Component Value Date   NA 144 04/06/2018   K 4.2 04/06/2018   CL 102 04/06/2018   CALCIUM 9.8 04/06/2018   MG 2.1 04/06/2018    Bone Lab Results  Component Value Date   25OHVITD1 76 04/06/2018   25OHVITD2 <1.0 04/06/2018   25OHVITD3 76 04/06/2018    Coagulation No results found for: INR, LABPROT, APTT, PLT, DDIMER, LABHEMA, VITAMINK1, AT3  Cardiovascular No results found for: BNP, CKTOTAL, CKMB, TROPONINI, HGB, HCT, LABVMA  Inflammation (CRP: Acute Phase) (ESR: Chronic Phase) Lab Results  Component Value Date   CRP 2 04/06/2018   ESRSEDRATE 4 04/06/2018      Note: Above Lab results reviewed.  Imaging  Fluoro (C-Arm) (<60 min) (No Report) Fluoro was used, but no Radiologist interpretation will be provided.  Please refer to "NOTES" tab for provider progress note.  Assessment  The primary encounter diagnosis was Chronic pain syndrome. Diagnoses of Cancer-related pain, Chronic low back pain (Primary Area of Pain) (midline), Chronic lower extremity pain (Secondary Area of Pain) (Right), Failed cervical surgery syndrome, and Post-thoracotomy pain syndrome were also pertinent to this visit.  Plan of Care  Problem-specific:  No problem-specific Assessment & Plan notes found for this encounter.  I have discontinued Sr. Louellen Molder. Hartung Sr.'s oxyCODONE. I have also changed his oxyCODONE. Additionally, I am having him start on oxyCODONE. Lastly, I am having him maintain his aspirin EC, traZODone, cholecalciferol, Multiple Vitamins-Minerals (PRESERVISION AREDS 2 PO), loratadine, Advair Diskus, hydrochlorothiazide, ipratropium-albuterol, albuterol, Ventolin HFA, acetylcysteine, guaiFENesin, levofloxacin, FLUoxetine, hydrOXYzine, montelukast, acetaminophen, umeclidinium bromide, albuterol, Fluticasone-Salmeterol, fluticasone-salmeterol, LORazepam, pantoprazole,  losartan-hydrochlorothiazide, furosemide, Tudorza Pressair, escitalopram, pregabalin, lubiprostone, Trelegy Ellipta, tiZANidine, pregabalin, and lubiprostone.  Pharmacotherapy (Medications Ordered): Meds ordered this encounter  Medications  . oxyCODONE (OXYCONTIN) 15 mg 12 hr tablet    Sig: Take 1 tablet (15 mg total) by mouth 3 (three) times daily. Must last 30 days.    Dispense:  90 tablet    Refill:  0    Chronic Cancer Pain: STOP Act (Not applicable) Fill 1 day early if closed on refill date. Do not fill until: 12/01/2019. To last until: 12/31/2019. Terminal cancer patient.  Marland Kitchen oxyCODONE (OXY IR/ROXICODONE) 5 MG immediate release tablet    Sig: Take 1 tablet (5 mg total) by mouth every 4 (four) hours as needed for breakthrough pain. Must last 30 days.    Dispense:  180 tablet    Refill:  0    Chronic Cancer Pain: STOP Act (Not applicable) Fill  1 day early if closed on refill date. Do not fill until: 12/01/2019. To last until: 12/31/2019. Terminal cancer patient.   Orders:  No orders of the defined types were placed in this encounter.  Follow-up plan:   Return in about 1 month (around 12/29/2019) for (VV), (MM).      Interventional management options:  Considering:   Diagnostic right L3 TFESI  Diagnostic right L5 TFESI    Palliative PRN treatment(s):   Palliativebilateral lumbar facet block #2 Palliative left cervical ESI #7  Palliative right T8-9 thoracic ESI #4  Palliative ML T6-7 thoracic ESI #3  Palliative right L5-S1 LESI #2  Palliative right L4-5 LESI #5  Palliative left hand, ring finger (No.:4)(A-4 pulley), trigger finger injection #4  Palliative ML L2-3 interspinous ligament injection #2      Recent Visits Date Type Provider Dept  11/01/19 Telemedicine Milinda Pointer, MD [facility]  10/19/19 Procedure visit Milinda Pointer, MD [facility]  09/20/19 Telemedicine Milinda Pointer, MD [facility]  Showing recent visits within past  90 days and meeting all other requirements   Today's Visits Date Type Provider Dept  12/01/19 Telemedicine Milinda Pointer, MD [facility]  Showing today's visits and meeting all other requirements   Future Appointments No visits were found meeting these conditions.  Showing future appointments within next 90 days and meeting all other requirements   I discussed the assessment and treatment plan with the patient. The patient was provided an opportunity to ask questions and all were answered. The patient agreed with the plan and demonstrated an understanding of the instructions.  Patient advised to call back or seek an in-person evaluation if the symptoms or condition worsens.  Duration of encounter: 13 minutes.  Note by: Gaspar Cola, MD Date: 12/01/2019; Time: 9:05 AM

## 2019-12-01 ENCOUNTER — Other Ambulatory Visit: Payer: Self-pay

## 2019-12-01 ENCOUNTER — Ambulatory Visit: Payer: Medicare Other | Attending: Pain Medicine | Admitting: Pain Medicine

## 2019-12-01 DIAGNOSIS — G893 Neoplasm related pain (acute) (chronic): Secondary | ICD-10-CM

## 2019-12-01 DIAGNOSIS — M961 Postlaminectomy syndrome, not elsewhere classified: Secondary | ICD-10-CM

## 2019-12-01 DIAGNOSIS — G8929 Other chronic pain: Secondary | ICD-10-CM

## 2019-12-01 DIAGNOSIS — G8912 Acute post-thoracotomy pain: Secondary | ICD-10-CM

## 2019-12-01 DIAGNOSIS — G894 Chronic pain syndrome: Secondary | ICD-10-CM | POA: Diagnosis not present

## 2019-12-01 DIAGNOSIS — M5441 Lumbago with sciatica, right side: Secondary | ICD-10-CM | POA: Diagnosis not present

## 2019-12-01 DIAGNOSIS — M79604 Pain in right leg: Secondary | ICD-10-CM

## 2019-12-01 MED ORDER — OXYCODONE HCL 5 MG PO TABS: 5.0000 mg | ORAL_TABLET | ORAL | 0 refills | Status: DC | PRN

## 2019-12-01 MED ORDER — OXYCODONE HCL ER 15 MG PO T12A: 15.0000 mg | EXTENDED_RELEASE_TABLET | Freq: Three times a day (TID) | ORAL | 0 refills | Status: DC

## 2019-12-13 ENCOUNTER — Telehealth: Payer: Self-pay

## 2019-12-13 NOTE — Telephone Encounter (Signed)
Rick Bridge NP for palliative care called to discuss Rick Carter pain medication regimen. States that he stopped the oxycontin 15 mg because it was making him feel drunk and was causing excessive drowsiness.  States that it was only lasting 3-4 hours anyway and wasn't helping very well.  The NP was wondering if we could cut back to the oxycontin 10 mg TID so that he could take at 6am 2pm and 10 pm and continue with the oxycodone 5 mg q4hours prn.  States currently he is taking Oxycontin 10 mg at 9am and 9pm.  He also takes oxycodone 5 mg at 1pm, 5pm, 1am and 5 am.  States he wakes up about 4am with unmanageable pain.  Seems that he is only getting 3 hours of relief from the 1am oxycodone.  Daytime pain relief seems to be in control, but its the nighttime that is causing the issues.  Patient has an appointment 12-29-2019 but will need to have an earlier appointment due to him stopping the oxycontin 15 mg and going back to the 10 mg.

## 2019-12-15 ENCOUNTER — Telehealth: Payer: Medicare Other | Admitting: Pain Medicine

## 2019-12-19 NOTE — Progress Notes (Signed)
Patient: Rick Patrick Sr.  Service Category: E/M  Provider: Gaspar Cola, MD  DOB: 07-20-1951  DOS: 12/20/2019  Location: Office  MRN: [id number]  Setting: Ambulatory outpatient  Referring Provider: [facility]  Type: Established Patient  Specialty: Interventional Pain Management  PCP: [facility]  Location: Remote location  Delivery: TeleHealth     Virtual Encounter - Pain Management PROVIDER NOTE: Information contained herein reflects review and annotations entered in association with encounter. Interpretation of such information and data should be left to medically-trained personnel. Information provided to patient can be located elsewhere in the medical record under "Patient Instructions". Document created using STT-dictation technology, any transcriptional errors that may result from process are unintentional.    Contact & Pharmacy Preferred: [phone] Home: [phone] (home) Mobile: [phone] (mobile) E-mail: No e-mail address on record  [id number] [place] [address]Phone: [phone]: [phone]  Pre-screening  Rick Carter "in-person" vs "virtual" encounter. He indicated preferring virtual for this encounter.   Reason COVID-19*  Social distancing based on CDC and AMA recommendations.   I contacted Rick PatrickSr. on 12/20/2019 via telephone.      I clearly identified myself as FGaspar Cola MD. I verified that I was speaking with the correct person using two identifiers (Name: Rick LEJASr., and date of birth: [birth year].  Consent I sought verbal advanced consent from JMonticellofor virtual visit interactions. I informed Mr. CSchlenderof possible security and privacy concerns, risks, and limitations associated with providing "not-in-person" medical evaluation and management services. I also informed Mr. CKoelzerof the availability of "in-person"  appointments. Finally, I informed him that there would be a charge for the virtual visit and that he could be  personally, fully or partially, financially responsible for it. Mr. CWhitebreadexpressed understanding and agreed to proceed.   Historic Elements   Mr. JTAUREN DELBUONOSr. is a 69y.o. year old, male patient evaluated today after his last contact with our practice on 12/13/2019. Mr. CFlinchum has a past medical history of Anxiety, Bronchitis, Cancer ([place], COPD (chronic obstructive pulmonary disease) ([place], Depression, Hernia of abdominal wall, History of alcoholism ([place] (08[place]08[place]2015), History of pneumonectomy (08/08/2015), Hypertension, Macular degeneration (08/26/2016), and Tumor cells. He also  has a past surgical history that includes Tonsillectomy; Lung removal, partial; and Neck surgery. Mr. CDarlinghas a current medication list which includes the following prescription(s): acetaminophen, acetylcysteine, tudorza pressair, advair diskus, albuterol, albuterol, aspirin ec, cholecalciferol, escitalopram, fluoxetine, fluticasone-salmeterol, fluticasone-salmeterol, trelegy ellipta, furosemide, guaifenesin, hydrochlorothiazide, ipratropium-albuterol, loratadine, lorazepam, losartan-hydrochlorothiazide, lubiprostone, montelukast, multiple vitamins-minerals, pantoprazole, pregabalin, tizanidine, trazodone, umeclidinium bromide, ventolin hfa, hydroxyzine, levofloxacin, lorazepam, lubiprostone, [START ON 01/19/2020] oxycodone, [START ON 02/18/2020] oxycodone, oxycodone, [START ON 01/19/2020] oxycodone, [START ON 02/18/2020] oxycodone, and pregabalin. He  reports that he quit smoking about 10 months ago. His smoking use included cigarettes. He quit after 51.00 years of use. He has never used smokeless tobacco. He reports that he does not drink alcohol. No history on file for drug. Mr. CCharteris allergic to atorvastatin and varenicline.   HPI  Today, he is being contacted for medication management.  Today I spoke to [name] NP, who is the nurse that has been taking care of Mr. CPaulsen  She indicates that the OxyContin (oxycodone ER) 15 mg 3 times daily was described by Mr. CHarnto be too high of a dose  and he does not like how it makes him feel.  He went back to the OxyContin 10 mg that he had left and he seems to be doing better with that but he still has that the medicine does not cover him at 3:00 in the morning, where he wakes up with pain.  After careful evaluation it would seem that he is doing much better with OxyContin 10 mg p.o. 3 times daily and oxycodone IR 5 mg 4 times daily as needed for pain.  Will be changing his regimen to this and we will provide him with enough medication to last for the next 90 days.  Today I called Mr. Shillingford following my conversation with his palliative nurse, and I have confirmed the above information.  I laid down the plan for him and he has agreed.  He is well aware and he has proven to be responsible with his medication.  He understands that if he is having any problems with the oxycodone ER 10 mg 3 times daily, he can bring it down to twice daily.  Pharmacotherapy Assessment  Analgesic: Oxycodone ER 10 mg, 1 tab PO TID (30 mg/day of oxycodone) + Oxycodone IR 5 mg, 1 tab PO q 6 hrs PRN for breakthrough pain (20 mg/day of oxycodone) for a total of 75 MME/day MME/day:75 MME/day.   Monitoring: [place] PMP: PDMP reviewed during this encounter.       Pharmacotherapy: No side-effects or adverse reactions reported. Compliance: No problems identified. Effectiveness: Clinically acceptable. Plan: Refer to "POC".  UDS:  Summary  Date Value Ref Range Status  03/26/2018 FINAL  Final    Comment:    ==================================================================== TOXASSURE SELECT 13 (MW) ==================================================================== Test                             Result       Flag       Units Drug Present and Declared for Prescription Verification    Oxycodone                      740          EXPECTED   ng/mg creat   Oxymorphone                    2200         EXPECTED   ng/mg creat   Noroxycodone                   1517         EXPECTED   ng/mg creat   Noroxymorphone                 623          EXPECTED   ng/mg creat    Sources of oxycodone are scheduled prescription medications.    Oxymorphone, noroxycodone, and noroxymorphone are expected    metabolites of oxycodone. Oxymorphone is also available as a    scheduled prescription medication. ==================================================================== Test                      Result    Flag   Units      Ref Range   Creatinine              35               mg/dL      >=20 ====================================================================  Declared Medications:  The flagging and interpretation on this report are based on the  following declared medications.  Unexpected results may arise from  inaccuracies in the declared medications.  **Note: The testing scope of this panel includes these medications:  Oxycodone (Roxicet)  **Note: The testing scope of this panel does not include following  reported medications:  Acetaminophen (Roxicet)  Aclidinium (Tudorza Pressair)  Albuterol  Albuterol (Duoneb)  Aspirin (Aspirin 81)  Calcium carbonate (Calcium carbonate/Vitamin D3)  Cholecalciferol  Diphenhydramine (Benadryl)  Docusate (Colace)  Fluticasone (Advair)  Hydrochlorothiazide (Microzide)  Hydroxyzine (Atarax)  Ibuprofen (Advil)  Ipratropium (Duoneb)  Loratadine  Metformin  Methocarbamol  Multivitamin  Pantoprazole (Protonix)  Pregabalin (Lyrica)  Salmeterol (Advair)  Sildenafil (Viagra)  Trazodone  Vitamin D3 (Calcium carbonate/Vitamin D3) ==================================================================== For clinical consultation, please call [phone]. ====================================================================    Laboratory Chemistry  Profile   Renal Lab Results  Component Value Date   BUN 9 04/06/2018   CREATININE 0.84 04/06/2018   BCR 11 04/06/2018   GFRAA 104 04/06/2018   GFRNONAA 90 04/06/2018    Hepatic Lab Results  Component Value Date   AST 18 04/06/2018   ALT 24 01/06/2017   ALBUMIN 4.6 04/06/2018   ALKPHOS 64 04/06/2018    Electrolytes Lab Results  Component Value Date   NA 144 04/06/2018   K 4.2 04/06/2018   CL 102 04/06/2018   CALCIUM 9.8 04/06/2018   MG 2.1 04/06/2018    Bone Lab Results  Component Value Date   25OHVITD1 76 04/06/2018   25OHVITD2 <1.0 04/06/2018   25OHVITD3 76 04/06/2018    Inflammation (CRP: Acute Phase) (ESR: Chronic Phase) Lab Results  Component Value Date   CRP 2 04/06/2018   ESRSEDRATE 4 04/06/2018      Note: Above Lab results reviewed.  Imaging  Fluoro (C-Arm) (<60 min) (No Report) Fluoro was used, but no Radiologist interpretation will be provided.  Please refer to "NOTES" tab for provider progress note.  Assessment  Diagnoses of Chronic pain syndrome and Cancer-related pain were pertinent to this visit.  Plan of Care  Problem-specific:  No problem-specific Assessment & Plan notes found for this encounter.  Mr. MAYLON SAILORS Sr. has a current medication list which includes the following long-term medication(s): tudorza pressair, advair diskus, albuterol, albuterol, escitalopram, fluticasone-salmeterol, fluticasone-salmeterol, furosemide, hydrochlorothiazide, ipratropium-albuterol, loratadine, losartan-hydrochlorothiazide, lubiprostone, pantoprazole, pregabalin, tizanidine, trazodone, ventolin hfa, lubiprostone, [START ON 01/19/2020] oxycodone, [START ON 02/18/2020] oxycodone, oxycodone, [START ON 01/19/2020] oxycodone, [START ON 02/18/2020] oxycodone, and pregabalin.  Pharmacotherapy (Medications Ordered): Meds ordered this encounter  Medications  . oxyCODONE (OXYCONTIN) 10 mg 12 hr tablet    Sig: Take 1 tablet (10 mg total) by mouth 3 (three) times daily.  Must last 30 days.    Dispense:  90 tablet    Refill:  0    Chronic Cancer Pain: STOP Act (Not applicable) Fill 1 day early if closed on refill date. Do not fill until: 12/20/2019. To last until: 01/19/2020. Terminal cancer patient.  Marland Kitchen oxyCODONE (OXY IR/ROXICODONE) 5 MG immediate release tablet    Sig: Take 1 tablet (5 mg total) by mouth every 6 (six) hours as needed for breakthrough pain. Must last 30 days. Take every 4-6 hrs. Max.: 4/day.    Dispense:  120 tablet    Refill:  0    Chronic Cancer Pain: STOP Act (Not applicable) Fill 1 day early if closed on refill date. Do not fill until: 01/19/2020. To last until: 02/18/2020. Terminal cancer patient.  Marland Kitchen oxyCODONE (  OXYCONTIN) 10 mg 12 hr tablet    Sig: Take 1 tablet (10 mg total) by mouth 3 (three) times daily. Must last 30 days.    Dispense:  90 tablet    Refill:  0    Chronic Cancer Pain: STOP Act (Not applicable) Fill 1 day early if closed on refill date. Do not fill until: 01/19/2020. To last until: 02/18/2020. Terminal cancer patient.  Marland Kitchen oxyCODONE (OXYCONTIN) 10 mg 12 hr tablet    Sig: Take 1 tablet (10 mg total) by mouth 3 (three) times daily. Must last 30 days.    Dispense:  90 tablet    Refill:  0    Chronic Cancer Pain: STOP Act (Not applicable) Fill 1 day early if closed on refill date. Do not fill until: 02/18/2020. To last until: 03/19/2020. Terminal cancer patient.  Marland Kitchen oxyCODONE (OXY IR/ROXICODONE) 5 MG immediate release tablet    Sig: Take 1 tablet (5 mg total) by mouth every 6 (six) hours as needed for breakthrough pain. Must last 30 days. Take every 4-6 hrs. Max.: 4/day.    Dispense:  120 tablet    Refill:  0    Chronic Cancer Pain: STOP Act (Not applicable) Fill 1 day early if closed on refill date. Do not fill until: 02/18/2020. To last until: 03/19/2020. Terminal cancer patient.   Orders:  No orders of the defined types were placed in this encounter.  Follow-up plan:   Return in about 3 months (around 03/15/2020) for (VV), (MM).       Interventional management options:  Considering:   Diagnostic right L3 TFESI  Diagnostic right L5 TFESI    Palliative PRN treatment(s):   Palliativebilateral lumbar facet block #2 Palliative left cervical ESI #7  Palliative right T8-9 thoracic ESI #4  Palliative ML T6-7 thoracic ESI #3  Palliative right L5-S1 LESI #2  Palliative right L4-5 LESI #5  Palliative left hand, ring finger (No.:4)(A-4 pulley), trigger finger injection #4  Palliative ML L2-3 interspinous ligament injection #2     Recent Visits Date Type Provider Dept  12/01/19 Telemedicine Milinda Pointer, [facility]  11/01/19 Telemedicine Milinda Pointer, MD [facility]  10/19/19 Procedure visit Milinda Pointer, MD [facility]  Showing recent visits within past 90 days and meeting all other requirements   Today's Visits Date Type Provider Dept  12/20/19 Office Visit Milinda Pointer, MD [facility]  Showing today's visits and meeting all other requirements   Future Appointments Date Type Provider Dept  12/29/19 Appointment Milinda Pointer, MD [facility]  Showing future appointments within next 90 days and meeting all other requirements   I discussed the assessment and treatment plan with the patient. The patient was provided an opportunity to ask questions and all were answered. The patient agreed with the plan and demonstrated an understanding of the instructions.  Patient advised to call back or seek an in-person evaluation if the symptoms or condition worsens.  Duration of encounter: 15 minutes.  Note by: Gaspar Cola, MD Date: 12/20/2019; Time: 12:36 PM

## 2019-12-20 ENCOUNTER — Encounter: Payer: Self-pay | Admitting: Pain Medicine

## 2019-12-20 ENCOUNTER — Other Ambulatory Visit: Payer: Self-pay

## 2019-12-20 ENCOUNTER — Ambulatory Visit: Payer: Medicare Other | Attending: Pain Medicine | Admitting: Pain Medicine

## 2019-12-20 DIAGNOSIS — G893 Neoplasm related pain (acute) (chronic): Secondary | ICD-10-CM

## 2019-12-20 DIAGNOSIS — G894 Chronic pain syndrome: Secondary | ICD-10-CM

## 2019-12-20 MED ORDER — OXYCODONE HCL 5 MG PO TABS: 5.0000 mg | ORAL_TABLET | Freq: Four times a day (QID) | ORAL | 0 refills | Status: DC | PRN

## 2019-12-20 MED ORDER — OXYCODONE HCL ER 10 MG PO T12A: 10.0000 mg | EXTENDED_RELEASE_TABLET | Freq: Three times a day (TID) | ORAL | 0 refills | Status: DC

## 2019-12-29 ENCOUNTER — Telehealth: Payer: Medicare Other | Admitting: Pain Medicine

## 2020-01-13 ENCOUNTER — Telehealth: Payer: Self-pay | Admitting: Pain Medicine

## 2020-01-13 NOTE — Telephone Encounter (Signed)
Rick Carter calling to give update on Rick Carter. States he is taking Oxycontin 84m 2x day, Oxcodone 3x day.  He is doing ok with this regimen. He tells her his pain is changing and worsening.

## 2020-01-13 NOTE — Telephone Encounter (Signed)
Thanks for letting us know.

## 2020-03-15 ENCOUNTER — Other Ambulatory Visit: Payer: Self-pay

## 2020-03-15 ENCOUNTER — Ambulatory Visit: Payer: Medicare Other | Attending: Pain Medicine | Admitting: Pain Medicine

## 2020-03-15 DIAGNOSIS — K5903 Drug induced constipation: Secondary | ICD-10-CM

## 2020-03-15 DIAGNOSIS — G893 Neoplasm related pain (acute) (chronic): Secondary | ICD-10-CM | POA: Diagnosis not present

## 2020-03-15 DIAGNOSIS — M5441 Lumbago with sciatica, right side: Secondary | ICD-10-CM

## 2020-03-15 DIAGNOSIS — M7918 Myalgia, other site: Secondary | ICD-10-CM

## 2020-03-15 DIAGNOSIS — T402X5A Adverse effect of other opioids, initial encounter: Secondary | ICD-10-CM

## 2020-03-15 DIAGNOSIS — M79604 Pain in right leg: Secondary | ICD-10-CM | POA: Diagnosis not present

## 2020-03-15 DIAGNOSIS — M5412 Radiculopathy, cervical region: Secondary | ICD-10-CM

## 2020-03-15 DIAGNOSIS — M792 Neuralgia and neuritis, unspecified: Secondary | ICD-10-CM

## 2020-03-15 DIAGNOSIS — G894 Chronic pain syndrome: Secondary | ICD-10-CM

## 2020-03-15 DIAGNOSIS — G8929 Other chronic pain: Secondary | ICD-10-CM

## 2020-03-15 MED ORDER — LUBIPROSTONE 8 MCG PO CAPS: 8.0000 ug | ORAL_CAPSULE | Freq: Two times a day (BID) | ORAL | 5 refills | Status: DC

## 2020-03-15 MED ORDER — OXYCODONE HCL ER 10 MG PO T12A: 10.0000 mg | EXTENDED_RELEASE_TABLET | Freq: Three times a day (TID) | ORAL | 0 refills | Status: DC

## 2020-03-15 MED ORDER — PREGABALIN 150 MG PO CAPS: 150.0000 mg | ORAL_CAPSULE | Freq: Three times a day (TID) | ORAL | 5 refills | Status: DC

## 2020-03-15 MED ORDER — OXYCODONE HCL 5 MG PO TABS: 5.0000 mg | ORAL_TABLET | Freq: Four times a day (QID) | ORAL | 0 refills | Status: DC | PRN

## 2020-03-15 MED ORDER — TIZANIDINE HCL 4 MG PO TABS: 4.0000 mg | ORAL_TABLET | Freq: Three times a day (TID) | ORAL | 5 refills | Status: AC | PRN

## 2020-03-15 NOTE — Progress Notes (Signed)
Patient: Rick Patrick Sr.  Service Category: E/M  Provider: Gaspar Cola, MD  DOB: [birth year]  DOS: 03/15/2020  Location: Office  MRN: [id number]  Setting: Ambulatory outpatient  Referring Provider: [facility] [place]  Type: Established Patient  Specialty: Interventional Pain Management  PCP: [facility] [place]  Location: Remote location  Delivery: TeleHealth     Virtual Encounter - Pain Management PROVIDER NOTE: Information contained herein reflects review and annotations entered in association with encounter. Interpretation of such information and data should be left to medically-trained personnel. Information provided to patient can be located elsewhere in the medical record under "Patient Instructions". Document created using STT-dictation technology, any transcriptional errors that may result from process are unintentional.    Contact & Pharmacy Preferred: [phone] Home: [phone] (home) Mobile: [phone] (mobile) E-mail: No e-mail address on record  [id number] [place] [id number]: [phone]: [phone]  Pre-screening  Mr. CFunesoffered "in-person" vs "virtual" encounter. He indicated preferring virtual for this encounter.   Reason COVID-19*   Social distancing based on CDC and AMA recommendations.   I contacted Rick PatrickSr. on 03/15/2020 via telephone.      I clearly identified myself as FGaspar Cola MD. I verified that I was speaking with the correct person using two identifiers (Name: Rick LAUDERSr., and date of birth: [birth year] [place].  Consent I sought verbal advanced consent from Rick Carter virtual visit interactions. I informed Mr. CKriderof possible security and privacy concerns, risks, and limitations associated with providing "not-in-person" medical evaluation and management services. I also informed Mr. CDehaasof the availability of "in-person"  appointments. Finally, I informed him that there would be a charge for the virtual visit and that he could be  personally, fully or partially, financially responsible for it. Rick Carter understanding and agreed to proceed.   Historic Elements   [name] is a 69y.o. year old, male patient evaluated today after his last contact with our practice on 01/13/2020. Rick Carter has a past medical history of Anxiety, Bronchitis, Cancer ([place], COPD (chronic obstructive pulmonary disease) ([place], Depression, Hernia of abdominal wall, History of [place]ism ([place] (08/08/2015), History of pneumonectomy (08/08/2015), Hypertension, Macular degeneration (08/26/2016), and Tumor cells. He also  has a past surgical history that includes Tonsillectomy; Lung removal, partial; and Neck surgery. Rick Carter a current medication list which includes the following prescription(s): acetaminophen, acetylcysteine, tudorza pressair, advair diskus, albuterol, aspirin ec, cholecalciferol, escitalopram, fluoxetine, fluticasone-salmeterol, trelegy ellipta, furosemide, guaifenesin, hydrochlorothiazide, hydroxyzine, ipratropium-albuterol, levofloxacin, loratadine, lorazepam, lorazepam, losartan-hydrochlorothiazide, lubiprostone, multiple vitamins-minerals, [START ON 03/19/2020] oxycodone, [START ON 04/18/2020] oxycodone, [START ON 05/18/2020] oxycodone, [START ON 03/19/2020] oxycodone, [START ON 04/18/2020] oxycodone, [START ON 05/18/2020] oxycodone, pantoprazole, pregabalin, tizanidine, trazodone, umeclidinium bromide, ventolin hfa, albuterol, and fluticasone-salmeterol. He  reports that he quit smoking about 13 months ago. His smoking use included cigarettes. He quit after 51.00 years of use. He has never used smokeless tobacco. He reports that he does not drink alcohol. No history on file for drug. Rick Carter allergic to atorvastatin and varenicline.   HPI  Today, he is being contacted for medication management. The patient  indicates doing well with the current medication regimen. No adverse reactions or side effects reported to the medications.   Pharmacotherapy Assessment  Analgesic: Oxycodone ER 10 mg, 1 tab PO TID (30 mg/day of oxycodone) + Oxycodone  IR 5 mg, 1 tab PO q 6 hrs PRN for breakthrough pain (20 mg/day of oxycodone) for a total of 75 MME/day MME/day:75 MME/day.   Monitoring: Rick [place] PMP: PDMP reviewed during this encounter.       Pharmacotherapy: No side-effects or adverse reactions reported. Compliance: No problems identified. Effectiveness: Clinically acceptable.  UDS:  Summary  Date Value Ref Range Status  03/26/2018 FINAL  Final    Comment:    ==================================================================== TOXASSURE SELECT 13 (MW) ==================================================================== Test                             Result       Flag       Units Drug Present and Declared for Prescription Verification   Oxycodone                      740          EXPECTED   ng/mg creat   Oxymorphone                    2200         EXPECTED   ng/mg creat   Noroxycodone                   1517         EXPECTED   ng/mg creat   Noroxymorphone                 623          EXPECTED   ng/mg creat    Sources of oxycodone are scheduled prescription medications.    Oxymorphone, noroxycodone, and noroxymorphone are expected    metabolites of oxycodone. Oxymorphone is also available as a    scheduled prescription medication. ==================================================================== Test                      Result    Flag   Units      Ref Range   Creatinine              35               mg/dL      >=20 ==================================================================== Declared Medications:  The flagging and interpretation on this report are based on the  following declared medications.  Unexpected results may arise from  inaccuracies in the declared medications.  **Note: The testing  scope of this panel includes these medications:  Oxycodone (Roxicet)  **Note: The testing scope of this panel does not include following  reported medications:  Acetaminophen (Roxicet)  Aclidinium (Tudorza Pressair)  Albuterol  Albuterol (Duoneb)  Aspirin (Aspirin 81)  Calcium carbonate (Calcium carbonate/Vitamin D3)  Cholecalciferol  Diphenhydramine (Benadryl)  Docusate (Colace)  Fluticasone (Advair)  Hydrochlorothiazide (Microzide)  Hydroxyzine (Atarax)  Ibuprofen (Advil)  Ipratropium (Duoneb)  Loratadine  Metformin  Methocarbamol  Multivitamin  Pantoprazole (Protonix)  Pregabalin (Lyrica)  Salmeterol (Advair)  Sildenafil (Viagra)  Trazodone  Vitamin D3 (Calcium carbonate/Vitamin D3) ==================================================================== For clinical consultation, please call [phone]. ====================================================================    Pharmacotherapy Assessment  Analgesic: Oxycodone ER 10 mg, 1 tab PO TID (30 mg/day of oxycodone) + Oxycodone IR 5 mg, 1 tab PO q 6 hrs PRN for breakthrough pain (20 mg/day of oxycodone) for a total of 75 MME/day MME/day:75 MME/day.   Monitoring: [place] PMP: PDMP reviewed during this encounter.       Pharmacotherapy: No side-effects or  adverse reactions reported. Compliance: No problems identified. Effectiveness: Clinically acceptable. Plan: Refer to "POC".  UDS:  Summary  Date Value Ref Range Status  03/26/2018 FINAL  Final    Comment:    ==================================================================== TOXASSURE SELECT 13 (MW) ==================================================================== Test                             Result       Flag       Units Drug Present and Declared for Prescription Verification   Oxycodone                      740          EXPECTED   ng/mg creat   Oxymorphone                    2200         EXPECTED   ng/mg creat   Noroxycodone                   1517          EXPECTED   ng/mg creat   Noroxymorphone                 623          EXPECTED   ng/mg creat    Sources of oxycodone are scheduled prescription medications.    Oxymorphone, noroxycodone, and noroxymorphone are expected    metabolites of oxycodone. Oxymorphone is also available as a    scheduled prescription medication. ==================================================================== Test                      Result    Flag   Units      Ref Range   Creatinine              35               mg/dL      >=20 ==================================================================== Declared Medications:  The flagging and interpretation on this report are based on the  following declared medications.  Unexpected results may arise from  inaccuracies in the declared medications.  **Note: The testing scope of this panel includes these medications:  Oxycodone (Roxicet)  **Note: The testing scope of this panel does not include following  reported medications:  Acetaminophen (Roxicet)  Aclidinium (Tudorza Pressair)  Albuterol  Albuterol (Duoneb)  Aspirin (Aspirin 81)  Calcium carbonate (Calcium carbonate/Vitamin D3)  Cholecalciferol  Diphenhydramine (Benadryl)  Docusate (Colace)  Fluticasone (Advair)  Hydrochlorothiazide (Microzide)  Hydroxyzine (Atarax)  Ibuprofen (Advil)  Ipratropium (Duoneb)  Loratadine  Metformin  Methocarbamol  Multivitamin  Pantoprazole (Protonix)  Pregabalin (Lyrica)  Salmeterol (Advair)  Sildenafil (Viagra)  Trazodone  Vitamin D3 (Calcium carbonate/Vitamin D3) ==================================================================== For clinical consultation, please call [phone]. ====================================================================     Laboratory Chemistry Profile   Renal Lab Results  Component Value Date   BUN 9 04/06/2018   CREATININE 0.84 04/06/2018   BCR 11 04/06/2018   GFRAA 104 04/06/2018   GFRNONAA 90 04/06/2018      Hepatic Lab Results  Component Value Date   AST 18 04/06/2018   ALT 24 01/06/2017   ALBUMIN 4.6 04/06/2018   ALKPHOS 64 04/06/2018     Electrolytes Lab Results  Component Value Date   NA 144 04/06/2018   K 4.2 04/06/2018   CL 102 04/06/2018   CALCIUM 9.8 04/06/2018   MG  2.1 04/06/2018     Bone Lab Results  Component Value Date   25OHVITD1 76 04/06/2018   25OHVITD2 <1.0 04/06/2018   25OHVITD3 76 04/06/2018     Inflammation (CRP: Acute Phase) (ESR: Chronic Phase) Lab Results  Component Value Date   CRP 2 04/06/2018   ESRSEDRATE 4 04/06/2018       Note: Above Lab results reviewed.   Imaging  Fluoro (C-Arm) (<60 min) (No Report) Fluoro was used, but no Radiologist interpretation will be provided.  Please refer to "NOTES" tab for provider progress note.  Assessment  The primary encounter diagnosis was Chronic pain syndrome. Diagnoses of Cancer-related pain, Chronic low back pain (Primary Area of Pain) (midline), Chronic lower extremity pain (Secondary Area of Pain) (Right), Chronic cervical radicular pain (Left), Chronic musculoskeletal pain, Neurogenic pain, Neuropathic pain, and Opioid-induced constipation (OIC) were also pertinent to this visit.  Plan of Care  Problem-specific:  No problem-specific Assessment & Plan notes found for this encounter.  Mr. MIRON MARXEN Sr. has a current medication list which includes the following long-term medication(s): tudorza pressair, advair diskus, albuterol, escitalopram, fluticasone-salmeterol, furosemide, hydrochlorothiazide, ipratropium-albuterol, loratadine, losartan-hydrochlorothiazide, lubiprostone, [START ON 03/19/2020] oxycodone, [START ON 04/18/2020] oxycodone, [START ON 05/18/2020] oxycodone, [START ON 03/19/2020] oxycodone, [START ON 04/18/2020] oxycodone, [START ON 05/18/2020] oxycodone, pantoprazole, pregabalin, tizanidine, trazodone, ventolin hfa, albuterol, and fluticasone-salmeterol.  Pharmacotherapy (Medications  Ordered): Meds ordered this encounter  Medications   oxyCODONE (OXY IR/ROXICODONE) 5 MG immediate release tablet    Sig: Take 1 tablet (5 mg total) by mouth every 6 (six) hours as needed for breakthrough pain. Must last 30 days. Take every 4-6 hrs. Max.: 4/day.    Dispense:  120 tablet    Refill:  0    Chronic Cancer Pain: STOP Act (Not applicable) Fill 1 day early if closed on refill date. Do not fill until: 03/19/2020. To last until: 04/18/2020. Terminal cancer patient.   oxyCODONE (OXYCONTIN) 10 mg 12 hr tablet    Sig: Take 1 tablet (10 mg total) by mouth 3 (three) times daily. Must last 30 days.    Dispense:  90 tablet    Refill:  0    Chronic Cancer Pain: STOP Act (Not applicable) Fill 1 day early if closed on refill date. Do not fill until: 03/19/2020. To last until: 04/18/2020. Terminal cancer patient.   oxyCODONE (OXY IR/ROXICODONE) 5 MG immediate release tablet    Sig: Take 1 tablet (5 mg total) by mouth every 6 (six) hours as needed for breakthrough pain. Must last 30 days. Take every 4-6 hrs. Max.: 4/day.    Dispense:  120 tablet    Refill:  0    Chronic Cancer Pain: STOP Act (Not applicable) Fill 1 day early if closed on refill date. Do not fill until: 04/18/2020. To last until: 05/18/2020. Terminal cancer patient.   oxyCODONE (OXY IR/ROXICODONE) 5 MG immediate release tablet    Sig: Take 1 tablet (5 mg total) by mouth every 6 (six) hours as needed for breakthrough pain. Must last 30 days. Take every 4-6 hrs. Max.: 4/day.    Dispense:  120 tablet    Refill:  0    Chronic Cancer Pain: STOP Act (Not applicable) Fill 1 day early if closed on refill date. Do not fill until: 05/18/2020. To last until: 06/17/2020. Terminal cancer patient.   oxyCODONE (OXYCONTIN) 10 mg 12 hr tablet    Sig: Take 1 tablet (10 mg total) by mouth 3 (three) times daily. Must last 30 days.  Dispense:  90 tablet    Refill:  0    Chronic Cancer Pain: STOP Act (Not applicable) Fill 1 day early if closed on refill date.  Do not fill until: 04/18/2020. To last until: 05/18/2020. Terminal cancer patient.   oxyCODONE (OXYCONTIN) 10 mg 12 hr tablet    Sig: Take 1 tablet (10 mg total) by mouth 3 (three) times daily. Must last 30 days.    Dispense:  90 tablet    Refill:  0    Chronic Cancer Pain: STOP Act (Not applicable) Fill 1 day early if closed on refill date. Do not fill until: 05/18/2020. To last until: 06/17/2020. Terminal cancer patient.   tiZANidine (ZANAFLEX) 4 MG tablet    Sig: Take 1 tablet (4 mg total) by mouth every 8 (eight) hours as needed for muscle spasms.    Dispense:  90 tablet    Refill:  5    Fill one day early if pharmacy is closed on scheduled refill date. May substitute for generic if available.   pregabalin (LYRICA) 150 MG capsule    Sig: Take 1 capsule (150 mg total) by mouth 3 (three) times daily.    Dispense:  90 capsule    Refill:  5    Fill one day early if pharmacy is closed on scheduled refill date. May substitute for generic if available.   lubiprostone (AMITIZA) 8 MCG capsule    Sig: Take 1 capsule (8 mcg total) by mouth 2 (two) times daily with a meal. Swallow whole, do not break or chew the medication.    Dispense:  60 capsule    Refill:  5    Do not place this medication, or any other prescription from our practice, on "Automatic Refill". Patient may have prescription filled one day early if pharmacy is closed on scheduled refill date.   Orders:  No orders of the defined types were placed in this encounter.  Follow-up plan:   Return in about 13 weeks (around 06/14/2020) for (F2F), (MM).      Interventional management options:  Considering:   Diagnostic right L3 TFESI  Diagnostic right L5 TFESI    Palliative PRN treatment(s):   Palliativebilateral lumbar facet block #2 Palliative left cervical ESI #7  Palliative right T8-9 thoracic ESI #4  Palliative ML T6-7 thoracic ESI #3  Palliative right L5-S1 LESI #2  Palliative right L4-5 LESI #5  Palliative left hand, ring  finger (No.:4)(A-4 pulley), trigger finger injection #4  Palliative ML L2-3 interspinous ligament injection #2      Recent Visits Date Type Provider Dept  12/20/19 Office Visit Milinda Pointer, MD [facility]  Showing recent visits within past 90 days and meeting all other requirements   Today's Visits Date Type Provider Dept  03/15/20 Telemedicine Milinda Pointer, MD [facility]  Showing today's visits and meeting all other requirements   Future Appointments No visits were found meeting these conditions.  Showing future appointments within next 90 days and meeting all other requirements   I discussed the assessment and treatment plan with the patient. The patient was provided an opportunity to ask questions and all were answered. The patient agreed with the plan and demonstrated an understanding of the instructions.  Patient advised to call back or seek an in-person evaluation if the symptoms or condition worsens.  Duration of encounter: 12 minutes.  Note by: Gaspar Cola, MD Date: 03/15/2020; Time: 2:35 PM

## 2020-06-14 ENCOUNTER — Ambulatory Visit: Payer: Medicare Other | Admitting: Pain Medicine

## 2020-06-18 NOTE — Progress Notes (Deleted)
NO SHOW

## 2020-06-20 ENCOUNTER — Ambulatory Visit: Payer: Medicare Other | Admitting: Pain Medicine

## 2020-08-13 ENCOUNTER — Other Ambulatory Visit: Payer: Self-pay | Admitting: Pain Medicine

## 2020-08-13 DIAGNOSIS — G8929 Other chronic pain: Secondary | ICD-10-CM

## 2021-05-27 NOTE — Progress Notes (Signed)
PROVIDER NOTE: Information contained herein reflects review and annotations entered in association with encounter. Interpretation of such information and data should be left to medically-trained personnel. Information provided to patient can be located elsewhere in the medical record under "Patient Instructions". Document created using STT-dictation technology, any transcriptional errors that may result from process are unintentional.    Patient: Rick Patrick Sr.  Service Category: E/M  Provider: Gaspar Cola, MD  DOB: 12-Nov-1950  DOS: 05/29/2021  Specialty: Interventional Pain Management  MRN: [id number]  Setting: Ambulatory outpatient  PCP: Martin  Type: Established Patient    Referring Provider: [facility] [place]  Location: Office  Delivery: Face-to-face     HPI  Mr. Rick BLACKERBY Sr., a 70 y.o. year old male, is here today because of his Cancer-related pain [G89.3]. Rick Carter primary complain today is Back Pain (low) Last encounter: My last encounter with him was on Visit date not found. Pertinent problems: Rick Carter has DDD (degenerative disc disease), cervical; Post-thoracotomy pain syndrome; Fibromyalgia; Chronic pain syndrome; Lumbar facet syndrome (Bilateral) (R>L); Chronic low back pain (1ry area of Pain) (midline); Lumbar spondylosis; Cervical spondylosis; Failed cervical surgery syndrome; Chronic musculoskeletal pain; Neurogenic pain; Neuropathic pain; Osteoarthritis; Thoracic spine pain; Thoracic radicular pain (Right); Chronic lumbar radicular pain (L5/S1 dermatome) (Right); Lumbar foraminal stenosis (L5-S1) (Right); Lumbar facet arthropathy (Bilateral) (R>L); Lumbar disc protrusion (L3-4) (Right); Chronic cervical radicular pain (4th area of Pain) (Left); Tumor of parotid gland; Trigger finger (middle finger) (Left); Pain of lumbar spine (L2-3 interspinous ligament); Chronic lower extremity pain (2ry area of Pain) (Right); DDD (degenerative disc disease), lumbar;  Failed back surgical syndrome; Chronic upper extremity pain (Left); DDD (degenerative disc disease), thoracic; Cervicalgia; Pain in finger of left hand; Malignant neoplasm of lower lobe of right lung ([place]); Cancer-related pain; and Chronic neck pain with history of cervical spinal surgery (3ry area of Pain) on their pertinent problem list. Pain Assessment: Severity of Chronic pain is reported as a 2 /10. Location: Back Lower/radiates down my right  leg. Onset: More than a month ago. Quality: Aching, Sharp. Timing: Intermittent. Modifying factor(s): medicaitons. Vitals:  height is _0  (1.778 m) and weight is 138 lb (62.6 kg). His temperature is 98 F (36.7 C). His blood pressure is 183/88 (abnormal) and his pulse is 69. His respiration is 20 and oxygen saturation is 100%.   Reason for encounter: follow-up evaluation.  Rick Carter last encounter was a virtual visit on 03/15/2020, after which he was admitted to hospice for palliative care due to his terminal cancer.  However, a year later he is still around and apparently hospice is limited to 6 months.  He refers that he has not been seen by any other oncologist since he was admitted to hospice and he is now being released and sent to [place] for continued palliative medication management.  However, many things have happened since the last time that we saw this patient including the fact that we no longer manage any other medications other than the opioid analgesics.  In reviewing his medications, they seem to be a little confusing to follow.  According to the patient he is taking oxycodone ER (OxyContin) 10 mg p.o. twice daily.  However some of his prescriptions are written for 3 times daily but they are not for 30 days.  In addition, he refers that he takes oxycodone IR 5 mg p.o. every 6 hours as needed for breakthrough pain, but again some of these prescriptions do not really  reflect that regimen.  Today we took some pill counts and we also contacted his  pharmacy to determine what was left from prior prescriptions.  We have decided to have the pharmacist cancel most of those so that we can take over and try to write the prescriptions in such a way that they will always be failed on the same day instead of having those prescriptions failed on different days.  Currently his sister is assisting him in ambulating, but he still lives alone.  Other than the OxyContin and the oxycodone IR, he also continues to take pregabalin (Lyrica) 150 mg p.o. 3 times daily.  We will assist him temporarily with this medication until he can get to see his primary care physician at which time we will let them take over that medicine.  Finally, the patient also indicates taking lorazepam (Ativan) 0.5 mg tablet, 1 4 times daily.  Once more, be available prescriptions do not seem to reflect that.  In any case, this 1 particular will not be writing for it since I never did write for it in the past and I am not about to start treating his anxiety.  Apparently he was being followed by an oncologist at [facility], but they are no longer doing that.  Information on his condition is rather scarce and I have entered a referral to [place] for them to evaluate his current condition to see if by any chance they can give me an idea as to how much time he has and whether or not this is terminal as we were left to believe.  From the medical standpoint, today he really looks cachectic secondary to the cancer he continues to have problems with COPD where he is on oxygen all of the time and at 1 point during our evaluation he ran out of oxygen and his saturations started going down right away.  We did get him an alert tank of oxygen, but clearly he is very much dependent on the supplemental oxygen.  Today we have obtained a UDS and we have provided the patient with prescriptions to last until 09/05/2021, at which time we need to see him back for medication management  evaluation.  Today I was very clear with him and his sister about the fact that I cannot manage his case over the phone and he will need to be coming in for his medication management and evaluation visits.  They indicated having understood.  Pharmacotherapy Assessment  Analgesic: Oxycodone ER 10 mg, 1 tab PO TID (30 mg/day of oxycodone) + Oxycodone IR 5 mg, 1 tab PO q 6 hrs PRN for breakthrough pain (20 mg/day of oxycodone) for a total of 75 MME/day MME/day: 61 MME/day.   Monitoring: [place] PMP: PDMP reviewed during this encounter.       Pharmacotherapy: No side-effects or adverse reactions reported. Compliance: No problems identified. Effectiveness: Clinically acceptable.  Dewayne Shorter, RN  05/29/2021 11:39 AM  Sign when Signing Visit Nursing Pain Medication Assessment:  Safety precautions to be maintained throughout the outpatient stay will include: orient to surroundings, keep bed in low position, maintain call bell within reach at all times, provide assistance with transfer out of bed and ambulation.  Medication Inspection Compliance: Pill count conducted under aseptic conditions, in front of the patient. Neither the pills nor the bottle was removed from the patient's sight at any time. Once count was completed pills were immediately returned to the patient in their original bottle.  Medication:  Oxycodone IR 5 mg Pill/Patch Count:  69 of 100 pills remain Pill/Patch Appearance: Markings consistent with prescribed medication Bottle Appearance: Standard pharmacy container. Clearly labeled. Filled Date: 08 / 07 / 2022 Last Medication intake:  Today  Oxycodone 10 mg 19/45 Filled 04/16/2021 Last took today      UDS:  Summary  Date Value Ref Range Status  03/26/2018 FINAL  Final    Comment:    ==================================================================== TOXASSURE SELECT 13 (MW) ==================================================================== Test                              Result       Flag       Units Drug Present and Declared for Prescription Verification   Oxycodone                      740          EXPECTED   ng/mg creat   Oxymorphone                    2200         EXPECTED   ng/mg creat   Noroxycodone                   1517         EXPECTED   ng/mg creat   Noroxymorphone                 623          EXPECTED   ng/mg creat    Sources of oxycodone are scheduled prescription medications.    Oxymorphone, noroxycodone, and noroxymorphone are expected    metabolites of oxycodone. Oxymorphone is also available as a    scheduled prescription medication. ==================================================================== Test                      Result    Flag   Units      Ref Range   Creatinine              35               mg/dL      >=20 ==================================================================== Declared Medications:  The flagging and interpretation on this report are based on the  following declared medications.  Unexpected results may arise from  inaccuracies in the declared medications.  **Note: The testing scope of this panel includes these medications:  Oxycodone (Roxicet)  **Note: The testing scope of this panel does not include following  reported medications:  Acetaminophen (Roxicet)  Aclidinium (Tudorza Pressair)  Albuterol  Albuterol (Duoneb)  Aspirin (Aspirin 81)  Calcium carbonate (Calcium carbonate/Vitamin D3)  Cholecalciferol  Diphenhydramine (Benadryl)  Docusate (Colace)  Fluticasone (Advair)  Hydrochlorothiazide (Microzide)  Hydroxyzine (Atarax)  Ibuprofen (Advil)  Ipratropium (Duoneb)  Loratadine  Metformin  Methocarbamol  Multivitamin  Pantoprazole (Protonix)  Pregabalin (Lyrica)  Salmeterol (Advair)  Sildenafil (Viagra)  Trazodone  Vitamin D3 (Calcium carbonate/Vitamin D3) ==================================================================== For clinical consultation, please call [phone]. ====================================================================      ROS  Constitutional: Denies any fever or chills Gastrointestinal: No reported hemesis, hematochezia, vomiting, or acute GI distress Musculoskeletal: Denies any acute onset joint swelling, redness, loss of ROM, or weakness Neurological: No reported episodes of acute onset apraxia, aphasia, dysarthria, agnosia, amnesia, paralysis, loss of coordination, or loss of consciousness  Medication Review  Aclidinium Bromide, FLUoxetine, Fluticasone-Salmeterol, Fluticasone-Umeclidin-Vilant, LORazepam, Multiple Vitamins-Minerals,  acetaminophen, acetylcysteine, albuterol, aspirin EC, cholecalciferol, escitalopram, fluticasone-salmeterol, furosemide, guaiFENesin, hydrOXYzine, hydrochlorothiazide, ipratropium-albuterol, loratadine, losartan-hydrochlorothiazide, lubiprostone, oxyCODONE, pantoprazole, pregabalin, tiZANidine, traZODone, and umeclidinium bromide  History Review  Allergy: Mr. Farquhar is allergic to atorvastatin and varenicline. Drug: Mr. Spatafore  has no history on file for drug use. Alcohol:  reports no history of alcohol use. Tobacco:  reports that he quit smoking about 2 years ago. His smoking use included cigarettes. He has never used smokeless tobacco. Social: Mr. Kotarski  reports that he quit smoking about 2 years ago. His smoking use included cigarettes. He has never used smokeless tobacco. He reports that he does not drink alcohol. Medical:  has a past medical history of Anxiety, Bronchitis, Cancer ([place]), COPD (chronic obstructive pulmonary disease) ([place]), Depression, Hernia of abdominal wall, History of alcoholism ([place]) (08/08/2015), History of pneumonectomy (08/08/2015), Hypertension, Macular degeneration (08/26/2016), and Tumor cells. Surgical: Mr. Groene  has a past surgical history that includes Tonsillectomy; Lung removal, partial; and Neck surgery. Family: family history includes Alcohol abuse in his  father; Kidney disease in his mother.  Laboratory Chemistry Profile   Renal Lab Results  Component Value Date   BUN 9 04/06/2018   CREATININE 0.84 04/06/2018   BCR 11 04/06/2018   GFRAA 104 04/06/2018   GFRNONAA 90 04/06/2018    Hepatic Lab Results  Component Value Date   AST 18 04/06/2018   ALT 24 01/06/2017   ALBUMIN 4.6 04/06/2018   ALKPHOS 64 04/06/2018    Electrolytes Lab Results  Component Value Date   NA 144 04/06/2018   K 4.2 04/06/2018   CL 102 04/06/2018   CALCIUM 9.8 04/06/2018   MG 2.1 04/06/2018    Bone Lab Results  Component Value Date   25OHVITD1 76 04/06/2018   25OHVITD2 <1.0 04/06/2018   25OHVITD3 76 04/06/2018    Inflammation (CRP: Acute Phase) (ESR: Chronic Phase) Lab Results  Component Value Date   CRP 2 04/06/2018   ESRSEDRATE 4 04/06/2018         Note: Above Lab results reviewed.  Recent Imaging Review  Fluoro (C-Arm) (<60 min) (No Report) Fluoro was used, but no Radiologist interpretation will be provided.  Please refer to "NOTES" tab for provider progress note. Note: Reviewed        Physical Exam  General appearance: Well nourished, well developed, and well hydrated. In no apparent acute distress Mental status: Alert, oriented x 3 (person, place, & time)       Respiratory: No evidence of acute respiratory distress Eyes: PERLA Vitals: BP (!) 183/88 (BP Location: Right Arm, Patient Position: Sitting, Cuff Size: Normal)   Pulse 69   Temp 98 F (36.7 C)   Resp 20   Ht _0  (1.778 m)   Wt 138 lb (62.6 kg)   SpO2 100%   BMI 19.80 kg/m  BMI: Estimated body mass index is 19.8 kg/m as calculated from the following:   Height as of this encounter: _1  (1.778 m).   Weight as of this encounter: 138 lb (62.6 kg). Ideal: Ideal body weight: 73 kg (160 lb 15 oz)  Assessment   Status Diagnosis  Controlled Controlled Controlled 1. Cancer-related pain   2. Chronic low back pain (1ry area of Pain) (midline)   3. Chronic lower  extremity pain (2ry area of Pain) (Right)   4. Chronic neck pain with history of cervical spinal surgery (3ry area of Pain)   5. Chronic cervical radicular pain (4th area of Pain) (Left)   6. Chronic  pain syndrome   7. Pharmacologic therapy   8. Chronic use of opiate for therapeutic purpose   9. Encounter for medication management   10. Neurogenic pain   11. Neuropathic pain      Updated Problems: Problem  Chronic neck pain with history of cervical spinal surgery (3ry area of Pain)  Chronic lower extremity pain (2ry area of Pain) (Right)  Chronic cervical radicular pain (4th area of Pain) (Left)  Chronic low back pain (1ry area of Pain) (midline)    Plan of Care  Problem-specific:  No problem-specific Assessment & Plan notes found for this encounter.  Mr. TAWAN DEGROOTE Sr. has a current medication list which includes the following long-term medication(s): tudorza pressair, advair diskus, albuterol, albuterol, escitalopram, fluticasone-salmeterol, furosemide, hydrochlorothiazide, ipratropium-albuterol, loratadine, losartan-hydrochlorothiazide, pantoprazole, trazodone, ventolin hfa, fluticasone-salmeterol, lubiprostone, [START ON 06/15/2021] oxycodone, [START ON 07/07/2021] oxycodone, [START ON 08/06/2021] oxycodone, [START ON 06/07/2021] oxycodone, [START ON 07/07/2021] oxycodone, [START ON 08/06/2021] oxycodone, [START ON 06/13/2021] pregabalin, [START ON 07/07/2021] pregabalin, and tizanidine.  Pharmacotherapy (Medications Ordered): Meds ordered this encounter  Medications   oxyCODONE (OXY IR/ROXICODONE) 5 MG immediate release tablet    Sig: Take 1 tablet (5 mg total) by mouth every 6 (six) hours as needed for up to 22 days for breakthrough pain. Must last 30 days. Max.: 4/day.    Dispense:  88 tablet    Refill:  0    Not a duplicate. Do NOT delete! Dispense 1 day early if closed on fill date. Warn not to take CNS-depressants 8 hours before or after taking opioid. Do not send refill  request. Renewal requires appointment.   oxyCODONE (OXYCONTIN) 10 mg 12 hr tablet    Sig: Take 1 tablet (10 mg total) by mouth every 12 (twelve) hours. Must last 30 days. Max.: 2/day    Dispense:  60 tablet    Refill:  0    Not a duplicate. Do NOT delete! Dispense 1 day early if closed on fill date. Warn not to take CNS-depressants 8 hours before or after taking opioid. Do not send refill request. Renewal requires appointment.   oxyCODONE (OXY IR/ROXICODONE) 5 MG immediate release tablet    Sig: Take 1 tablet (5 mg total) by mouth every 6 (six) hours as needed for breakthrough pain. Must last 30 days. Max.: 4/day.    Dispense:  88 tablet    Refill:  0    Not a duplicate. Do NOT delete! Dispense 1 day early if closed on fill date. Warn not to take CNS-depressants 8 hours before or after taking opioid. Do not send refill request. Renewal requires appointment.   oxyCODONE (OXY IR/ROXICODONE) 5 MG immediate release tablet    Sig: Take 1 tablet (5 mg total) by mouth every 6 (six) hours as needed for breakthrough pain. Must last 30 days. Max.: 4/day.    Dispense:  88 tablet    Refill:  0    Not a duplicate. Do NOT delete! Dispense 1 day early if closed on fill date. Warn not to take CNS-depressants 8 hours before or after taking opioid. Do not send refill request. Renewal requires appointment.   oxyCODONE (OXYCONTIN) 10 mg 12 hr tablet    Sig: Take 1 tablet (10 mg total) by mouth every 12 (twelve) hours. Must last 30 days. Max.: 2/day    Dispense:  60 tablet    Refill:  0    Not a duplicate. Do NOT delete! Dispense 1 day early if closed on fill date. Warn  not to take CNS-depressants 8 hours before or after taking opioid. Do not send refill request. Renewal requires appointment.   oxyCODONE (OXYCONTIN) 10 mg 12 hr tablet    Sig: Take 1 tablet (10 mg total) by mouth every 12 (twelve) hours. Must last 30 days. Max.: 2/day    Dispense:  60 tablet    Refill:  0    Not a duplicate. Do NOT delete!  Dispense 1 day early if closed on fill date. Warn not to take CNS-depressants 8 hours before or after taking opioid. Do not send refill request. Renewal requires appointment.   pregabalin (LYRICA) 150 MG capsule    Sig: Take 1 capsule (150 mg total) by mouth 3 (three) times daily for 24 days.    Dispense:  72 capsule    Refill:  0    Fill one day early if pharmacy is closed on scheduled refill date. May substitute for generic if available.   pregabalin (LYRICA) 150 MG capsule    Sig: Take 1 capsule (150 mg total) by mouth 3 (three) times daily.    Dispense:  90 capsule    Refill:  1    Fill one day early if pharmacy is closed on scheduled refill date. May substitute for generic if available.    Orders:  Orders Placed This Encounter  Procedures   ToxASSURE Select 13 (MW), Urine    Volume: 30 ml(s). Minimum 3 ml of urine is needed. Document temperature of fresh sample. Indications: Long term (current) use of opiate analgesic (Z79.891)    Order Specific Question:   Release to patient    Answer:   Immediate   Ambulatory referral to Hematology / Oncology    Referral Priority:   Routine    Referral Type:   Consultation    Referral Reason:   Specialty Services Required    Requested Specialty:   Oncology    Number of Visits Requested:   1   Nursing Instructions:    1. Medication Agreement: Please go over agreement with the patient. Have the patient read and sign the agreement. Provide patient with a copy of the signed agreement. 2. Make sure that the patient has completed the ORT (Opioid Risk Tool). 3. Provide the patient with a copy of our "Medicatiion Policy", "Medication Recommendations and Reminders", and "CBD information". 4. Remind the patient to always bring their medications and medication bottles (even if empty) to all appointments except for procedure appointments.    Scheduling Instructions:     Sign "Medication Agreement", complete "Opioid Risk Tool", inform patient of our  practice "Medication Policies" (Pill counts, always bring bottles, except on procedure days).   Nursing communication    Scheduling Instructions:     Complete/update the opioid risk tool (ORT) questionnaire.    Follow-up plan:   Return in about 3 months (around 09/05/2021) for (M,W), (F2F) (MM).     Interventional management options:  Considering:   Diagnostic right L3 TFESI  Diagnostic right L5 TFESI    Palliative PRN treatment(s):   Palliative bilateral lumbar facet block #2  Palliative left cervical ESI #7  Palliative right T8-9 thoracic ESI #4  Palliative ML T6-7 thoracic ESI #3  Palliative right L5-S1 LESI #2  Palliative right L4-5 LESI #5  Palliative left hand, ring finger (No.:4)(A-4 pulley), trigger finger injection #4  Palliative ML L2-3 interspinous ligament injection #2       Recent Visits No visits were found meeting these conditions. Showing recent visits within past 90 days  and meeting all other requirements Today's Visits Date Type Provider Dept  05/29/21 Office Visit Milinda Pointer, MD [facility]  Showing today's visits and meeting all other requirements Future Appointments No visits were found meeting these conditions. Showing future appointments within next 90 days and meeting all other requirements I discussed the assessment and treatment plan with the patient. The patient was provided an opportunity to ask questions and all were answered. The patient agreed with the plan and demonstrated an understanding of the instructions.  Patient advised to call back or seek an in-person evaluation if the symptoms or condition worsens.  Duration of encounter: 68 minutes.  Note by: Gaspar Cola, MD Date: 05/29/2021; Time: 1:27 PM

## 2021-05-29 ENCOUNTER — Other Ambulatory Visit: Payer: Self-pay

## 2021-05-29 ENCOUNTER — Encounter: Payer: Self-pay | Admitting: Pain Medicine

## 2021-05-29 ENCOUNTER — Ambulatory Visit: Payer: Medicare Other | Attending: Pain Medicine | Admitting: Pain Medicine

## 2021-05-29 VITALS — BP 183/88 | HR 69 | Temp 98.0°F | Resp 20 | Ht 70.0 in | Wt 138.0 lb

## 2021-05-29 DIAGNOSIS — Z79891 Long term (current) use of opiate analgesic: Secondary | ICD-10-CM | POA: Diagnosis present

## 2021-05-29 DIAGNOSIS — M5441 Lumbago with sciatica, right side: Secondary | ICD-10-CM | POA: Insufficient documentation

## 2021-05-29 DIAGNOSIS — M542 Cervicalgia: Secondary | ICD-10-CM

## 2021-05-29 DIAGNOSIS — M5412 Radiculopathy, cervical region: Secondary | ICD-10-CM

## 2021-05-29 DIAGNOSIS — M792 Neuralgia and neuritis, unspecified: Secondary | ICD-10-CM | POA: Diagnosis present

## 2021-05-29 DIAGNOSIS — Z9889 Other specified postprocedural states: Secondary | ICD-10-CM | POA: Diagnosis present

## 2021-05-29 DIAGNOSIS — G8928 Other chronic postprocedural pain: Secondary | ICD-10-CM | POA: Diagnosis present

## 2021-05-29 DIAGNOSIS — G893 Neoplasm related pain (acute) (chronic): Secondary | ICD-10-CM | POA: Diagnosis present

## 2021-05-29 DIAGNOSIS — G894 Chronic pain syndrome: Secondary | ICD-10-CM | POA: Diagnosis present

## 2021-05-29 DIAGNOSIS — Z79899 Other long term (current) drug therapy: Secondary | ICD-10-CM

## 2021-05-29 DIAGNOSIS — G8929 Other chronic pain: Secondary | ICD-10-CM | POA: Diagnosis present

## 2021-05-29 DIAGNOSIS — M79604 Pain in right leg: Secondary | ICD-10-CM | POA: Diagnosis present

## 2021-05-29 MED ORDER — PREGABALIN 150 MG PO CAPS: 150.0000 mg | ORAL_CAPSULE | Freq: Three times a day (TID) | ORAL | 0 refills | Status: AC

## 2021-05-29 MED ORDER — OXYCODONE HCL 5 MG PO TABS: 5.0000 mg | ORAL_TABLET | Freq: Four times a day (QID) | ORAL | 0 refills | Status: DC | PRN

## 2021-05-29 MED ORDER — OXYCODONE HCL ER 10 MG PO T12A: 10.0000 mg | EXTENDED_RELEASE_TABLET | Freq: Two times a day (BID) | ORAL | 0 refills | Status: DC

## 2021-05-29 MED ORDER — PREGABALIN 150 MG PO CAPS: 150.0000 mg | ORAL_CAPSULE | Freq: Three times a day (TID) | ORAL | 1 refills | Status: DC

## 2021-05-29 NOTE — Patient Instructions (Signed)
____________________________________________________________________________________________  Medication Rules  Purpose: To inform patients, and their family members, of our rules and regulations.  Applies to: All patients receiving prescriptions (written or electronic).  Pharmacy of record: Pharmacy where electronic prescriptions will be sent. If written prescriptions are taken to a different pharmacy, please inform the nursing staff. The pharmacy listed in the electronic medical record should be the one where you would like electronic prescriptions to be sent.  Electronic prescriptions: In compliance with the [place] Strengthen Opioid Misuse Prevention (STOP) Act of 2017 (Session Law 2017-74/H243), effective October 14, 2018, all controlled substances must be electronically prescribed. Calling prescriptions to the pharmacy will cease to exist.  Prescription refills: Only during scheduled appointments. Applies to all prescriptions.  NOTE: The following applies primarily to controlled substances (Opioid* Pain Medications).   Type of encounter (visit): For patients receiving controlled substances, face-to-face visits are required. (Not an option or up to the patient.)  Patient's responsibilities: Pain Pills: Bring all pain pills to every appointment (except for procedure appointments). Pill Bottles: Bring pills in original pharmacy bottle. Always bring the newest bottle. Bring bottle, even if empty. Medication refills: You are responsible for knowing and keeping track of what medications you take and those you need refilled. The day before your appointment: write a list of all prescriptions that need to be refilled. The day of the appointment: give the list to the admitting nurse. Prescriptions will be written only during appointments. No prescriptions will be written on procedure days. If you forget a medication: it will not be "Called in", "Faxed", or "electronically sent". You will  need to get another appointment to get these prescribed. No early refills. Do not call asking to have your prescription filled early. Prescription Accuracy: You are responsible for carefully inspecting your prescriptions before leaving our office. Have the discharge nurse carefully go over each prescription with you, before taking them home. Make sure that your name is accurately spelled, that your address is correct. Check the name and dose of your medication to make sure it is accurate. Check the number of pills, and the written instructions to make sure they are clear and accurate. Make sure that you are given enough medication to last until your next medication refill appointment. Taking Medication: Take medication as prescribed. When it comes to controlled substances, taking less pills or less frequently than prescribed is permitted and encouraged. Never take more pills than instructed. Never take medication more frequently than prescribed.  Inform other Doctors: Always inform, all of your healthcare providers, of all the medications you take. Pain Medication from other Providers: You are not allowed to accept any additional pain medication from any other Doctor or Healthcare provider. There are two exceptions to this rule. (see below) In the event that you require additional pain medication, you are responsible for notifying us, as stated below. Cough Medicine: Often these contain an opioid, such as codeine or hydrocodone. Never accept or take cough medicine containing these opioids if you are already taking an opioid* medication. The combination may cause respiratory failure and death. Medication Agreement: You are responsible for carefully reading and following our Medication Agreement. This must be signed before receiving any prescriptions from our practice. Safely store a copy of your signed Agreement. Violations to the Agreement will result in no further prescriptions. (Additional copies of our  Medication Agreement are available upon request.) Laws, Rules, & Regulations: All patients are expected to follow all Federal and State Laws, Statutes, Rules, & Regulations. Ignorance of   the Laws does not constitute a valid excuse.  Illegal drugs and Controlled Substances: The use of illegal substances (including, but not limited to marijuana and its derivatives) and/or the illegal use of any controlled substances is strictly prohibited. Violation of this rule may result in the immediate and permanent discontinuation of any and all prescriptions being written by our practice. The use of any illegal substances is prohibited. Adopted CDC guidelines & recommendations: Target dosing levels will be at or below 60 MME/day. Use of benzodiazepines** is not recommended.  Exceptions: There are only two exceptions to the rule of not receiving pain medications from other Healthcare Providers. Exception #1 (Emergencies): In the event of an emergency (i.e.: accident requiring emergency care), you are allowed to receive additional pain medication. However, you are responsible for: As soon as you are able, call our office [phone], at any time of the day or night, and leave a message stating your name, the date and nature of the emergency, and the name and dose of the medication prescribed. In the event that your call is answered by a member of our staff, make sure to document and save the date, time, and the name of the person that took your information.  Exception #2 (Planned Surgery): In the event that you are scheduled by another doctor or dentist to have any type of surgery or procedure, you are allowed (for a period no longer than 30 days), to receive additional pain medication, for the acute post-op pain. However, in this case, you are responsible for picking up a copy of our "Post-op Pain Management for Surgeons" handout, and giving it to your surgeon or dentist. This document is available at our office, and  does not require an appointment to obtain it. Simply go to our office during business hours (Monday-Thursday from 8:00 AM to 4:00 PM) (Friday 8:00 AM to 12:00 Noon) or if you have a scheduled appointment with [place], prior to your surgery, and ask for it by name. In addition, you are responsible for: calling our office [phone], at any time of the day or night, and leaving a message stating your name, name of your surgeon, type of surgery, and date of procedure or surgery. Failure to comply with your responsibilities may result in termination of therapy involving the controlled substances.  *Opioid medications include: morphine, codeine, oxycodone, oxymorphone, hydrocodone, hydromorphone, meperidine, tramadol, tapentadol, buprenorphine, fentanyl, methadone. **Benzodiazepine medications include: diazepam (Valium), alprazolam (Xanax), clonazepam (Klonopine), lorazepam (Ativan), clorazepate (Tranxene), chlordiazepoxide (Librium), estazolam (Prosom), oxazepam (Serax), temazepam (Restoril), triazolam (Halcion) (Last updated: 09/11/2020) ____________________________________________________________________________________________  ____________________________________________________________________________________________  Medication Recommendations and Reminders  Applies to: All patients receiving prescriptions (written and/or electronic).  Medication Rules & Regulations: These rules and regulations exist for your safety and that of others. They are not flexible and neither are we. Dismissing or ignoring them will be considered "non-compliance" with medication therapy, resulting in complete and irreversible termination of such therapy. (See document titled "Medication Rules" for more details.) In all conscience, because of safety reasons, we cannot continue providing a therapy where the patient does not follow instructions.  Pharmacy of record:  Definition: This is the pharmacy where your electronic  prescriptions will be sent.  We do not endorse any particular pharmacy, however, we have experienced problems with [facility] not securing enough medication supply for the community. We do not restrict you in your choice of pharmacy. However, once we write for your prescriptions, we will NOT be re-sending more prescriptions to fix restricted supply problems  created by your pharmacy, or your insurance.  The pharmacy listed in the electronic medical record should be the one where you want electronic prescriptions to be sent. If you choose to change pharmacy, simply notify our nursing staff.  Recommendations: Keep all of your pain medications in a safe place, under lock and key, even if you live alone. We will NOT replace lost, stolen, or damaged medication. After you fill your prescription, take 1 week's worth of pills and put them away in a safe place. You should keep a separate, properly labeled bottle for this purpose. The remainder should be kept in the original bottle. Use this as your primary supply, until it runs out. Once it's gone, then you know that you have 1 week's worth of medicine, and it is time to come in for a prescription refill. If you do this correctly, it is unlikely that you will ever run out of medicine. To make sure that the above recommendation works, it is very important that you make sure your medication refill appointments are scheduled at least 1 week before you run out of medicine. To do this in an effective manner, make sure that you do not leave the office without scheduling your next medication management appointment. Always ask the nursing staff to show you in your prescription , when your medication will be running out. Then arrange for the receptionist to get you a return appointment, at least 7 days before you run out of medicine. Do not wait until you have 1 or 2 pills left, to come in. This is very poor planning and does not take into consideration that we may need to  cancel appointments due to bad weather, sickness, or emergencies affecting our staff. DO NOT ACCEPT A "Partial Fill": If for any reason your pharmacy does not have enough pills/tablets to completely fill or refill your prescription, do not allow for a "partial fill". The law allows the pharmacy to complete that prescription within 72 hours, without requiring a new prescription. If they do not fill the rest of your prescription within those 72 hours, you will need a separate prescription to fill the remaining amount, which we will NOT provide. If the reason for the partial fill is your insurance, you will need to talk to the pharmacist about payment alternatives for the remaining tablets, but again, DO NOT ACCEPT A PARTIAL FILL, unless you can trust your pharmacist to obtain the remainder of the pills within 72 hours.  Prescription refills and/or changes in medication(s):  Prescription refills, and/or changes in dose or medication, will be conducted only during scheduled medication management appointments. (Applies to both, written and electronic prescriptions.) No refills on procedure days. No medication will be changed or started on procedure days. No changes, adjustments, and/or refills will be conducted on a procedure day. Doing so will interfere with the diagnostic portion of the procedure. No phone refills. No medications will be "called into the pharmacy". No Fax refills. No weekend refills. No Holliday refills. No after hours refills.  Remember:  Business hours are:  Monday to Thursday 8:00 AM to 4:00 PM Provider's Schedule: Milinda Pointer, MD - Appointments are:  Medication management: Monday and Wednesday 8:00 AM to 4:00 PM Procedure day: Tuesday and Thursday 7:30 AM to 4:00 PM Gillis Santa, MD - Appointments are:  Medication management: Tuesday and Thursday 8:00 AM to 4:00 PM Procedure day: Monday and Wednesday 7:30 AM to 4:00 PM (Last update:  05/03/2020) ____________________________________________________________________________________________  ____________________________________________________________________________________________  CBD (cannabidiol) WARNING  Applicable to: All individuals currently taking or considering taking CBD (cannabidiol) and, more important, all patients taking opioid analgesic controlled substances (pain medication). (Example: oxycodone; oxymorphone; hydrocodone; hydromorphone; morphine; methadone; tramadol; tapentadol; fentanyl; buprenorphine; butorphanol; dextromethorphan; meperidine; codeine; etc.)  Legal status: CBD remains a Schedule I drug prohibited for any use. CBD is illegal with one exception. In the [place], CBD has a limited Transport planner (FDA) approval for the treatment of two specific types of epilepsy disorders. Only one CBD product has been approved by the FDA for this purpose: "Epidiolex". FDA is aware that some companies are marketing products containing cannabis and cannabis-derived compounds in ways that violate the [employer], Drug and Cosmetic Act [facility] Act) and that may put the health and safety of consumers at risk. The FDA, a Federal agency, has not enforced the CBD status since 2018.   Legality: Some manufacturers ship CBD products nationally, which is illegal. Often such products are sold online and are therefore available throughout the country. CBD is openly sold in head shops and health food stores in some states where such sales have not been explicitly legalized. Selling unapproved products with unsubstantiated therapeutic claims is not only a violation of the law, but also can put patients at risk, as these products have not been proven to be safe or effective. Federal illegality makes it difficult to conduct research on CBD.  Reference: "FDA Regulation of Cannabis and Cannabis-Derived Products, Including Cannabidiol (CBD)" -  [employer]  Warning: CBD is not FDA approved and has not undergo the same manufacturing controls as prescription drugs.  This means that the purity and safety of available CBD may be questionable. Most of the time, despite manufacturer's claims, it is contaminated with THC (delta-9-tetrahydrocannabinol - the chemical in marijuana responsible for the "HIGH").  When this is the case, the [facility] contaminant will trigger a positive urine drug screen (UDS) test for Marijuana (carboxy-THC). Because a positive UDS for any illicit substance is a violation of our medication agreement, your opioid analgesics (pain medicine) may be permanently discontinued.  MORE ABOUT CBD  General Information: CBD  is a derivative of the Marijuana (cannabis sativa) plant discovered in 24. It is one of the 113 identified substances found in Marijuana. It accounts for up to 40% of the plant's extract. As of 2018, preliminary clinical studies on CBD included research for the treatment of anxiety, movement disorders, and pain. CBD is available and consumed in multiple forms, including inhalation of smoke or vapor, as an aerosol spray, and by mouth. It may be supplied as an oil containing CBD, capsules, dried cannabis, or as a liquid solution. CBD is thought not to be as psychoactive as THC (delta-9-tetrahydrocannabinol - the chemical in marijuana responsible for the "HIGH"). Studies suggest that CBD may interact with different biological target receptors in the body, including cannabinoid and other neurotransmitter receptors. As of 2018 the mechanism of action for its biological effects has not been determined.  Side-effects  Adverse reactions: Dry mouth, diarrhea, decreased appetite, fatigue, drowsiness, malaise, weakness, sleep disturbances, and others.  Drug interactions: CBC may interact with other medications  such as blood-thinners. (Last update: 05/20/2020) ____________________________________________________________________________________________  ____________________________________________________________________________________________  Drug Holidays (Slow)  What is a "Drug Holiday"? Drug Holiday: is the name given to the period of time during which a patient stops taking a medication(s) for the purpose of eliminating tolerance to the drug.  Benefits Improved effectiveness of opioids. Decreased opioid dose needed to achieve benefits. Improved pain with lesser dose.  What is tolerance? Tolerance: is the progressive decreased in effectiveness of a drug due to its repetitive use. With repetitive use, the body gets use to the medication and as a consequence, it loses its effectiveness. This is a common problem seen with opioid pain medications. As a result, a larger dose of the drug is needed to achieve the same effect that used to be obtained with a smaller dose.  How long should a "Drug Holiday" last? You should stay off of the pain medicine for at least 14 consecutive days. (2 weeks)  Should I stop the medicine "cold [place]"? No. You should always coordinate with your Pain Specialist so that he/she can provide you with the correct medication dose to make the transition as smoothly as possible.  How do I stop the medicine? Slowly. You will be instructed to decrease the daily amount of pills that you take by one (1) pill every seven (7) days. This is called a "slow downward taper" of your dose. For example: if you normally take four (4) pills per day, you will be asked to drop this dose to three (3) pills per day for seven (7) days, then to two (2) pills per day for seven (7) days, then to one (1) per day for seven (7) days, and at the end of those last seven (7) days, this is when the "Drug Holiday" would start.   Will I have withdrawals? By doing a "slow downward taper" like this one, it  is unlikely that you will experience any significant withdrawal symptoms. Typically, what triggers withdrawals is the sudden stop of a high dose opioid therapy. Withdrawals can usually be avoided by slowly decreasing the dose over a prolonged period of time. If you do not follow these instructions and decide to stop your medication abruptly, withdrawals may be possible.  What are withdrawals? Withdrawals: refers to the wide range of symptoms that occur after stopping or dramatically reducing opiate drugs after heavy and prolonged use. Withdrawal symptoms do not occur to patients that use low dose opioids, or those who take the medication sporadically. Contrary to benzodiazepine (example: Valium, Xanax, etc.) or alcohol withdrawals ("Delirium Tremens"), opioid withdrawals are not lethal. Withdrawals are the physical manifestation of the body getting rid of the excess receptors.  Expected Symptoms Early symptoms of withdrawal may include: Agitation Anxiety Muscle aches Increased tearing Insomnia Runny nose Sweating Yawning  Late symptoms of withdrawal may include: Abdominal cramping Diarrhea Dilated pupils Goose bumps Nausea Vomiting  Will I experience withdrawals? Due to the slow nature of the taper, it is very unlikely that you will experience any.  What is a slow taper? Taper: refers to the gradual decrease in dose.  (Last update: 05/03/2020) ____________________________________________________________________________________________

## 2021-05-29 NOTE — Progress Notes (Signed)
Nursing Pain Medication Assessment:  Safety precautions to be maintained throughout the outpatient stay will include: orient to surroundings, keep bed in low position, maintain call bell within reach at all times, provide assistance with transfer out of bed and ambulation.  Medication Inspection Compliance: Pill count conducted under aseptic conditions, in front of the patient. Neither the pills nor the bottle was removed from the patient's sight at any time. Once count was completed pills were immediately returned to the patient in their original bottle.  Medication: Oxycodone IR 5 mg Pill/Patch Count:  69 of 100 pills remain Pill/Patch Appearance: Markings consistent with prescribed medication Bottle Appearance: Standard pharmacy container. Clearly labeled. Filled Date: 08 / 07 / 2022 Last Medication intake:  Today  Oxycodone 10 mg 19/45 Filled 04/16/2021 Last took today

## 2021-05-30 ENCOUNTER — Telehealth: Payer: Self-pay | Admitting: Pain Medicine

## 2021-05-30 NOTE — Telephone Encounter (Signed)
Rick Carter notified that the patient does have cancer,  She states she will review the cahrt.

## 2021-05-30 NOTE — Telephone Encounter (Signed)
Melissa from cancer center, needs to verify why this patient is being referred to cancer center? States he does not have cancer. Please call her and advise.

## 2021-06-01 LAB — TOXASSURE SELECT 13 (MW), URINE

## 2021-06-03 DIAGNOSIS — K4020 Bilateral inguinal hernia, without obstruction or gangrene, not specified as recurrent: Secondary | ICD-10-CM | POA: Insufficient documentation

## 2021-06-08 ENCOUNTER — Inpatient Hospital Stay: Payer: Medicare Other | Admitting: Oncology

## 2021-06-08 ENCOUNTER — Inpatient Hospital Stay: Payer: Medicare Other

## 2021-06-11 NOTE — Progress Notes (Signed)
PROVIDER NOTE: Information contained herein reflects review and annotations entered in association with encounter. Interpretation of such information and data should be left to medically-trained personnel. Information provided to patient can be located elsewhere in the medical record under "Patient Instructions". Document created using STT-dictation technology, any transcriptional errors that may result from process are unintentional.    Patient: Rick Patrick Sr.  Service Category: E/M  Provider: Gaspar Cola, MD  DOB: 13-Jan-1951  DOS: 06/13/2021  Specialty: Interventional Pain Management  MRN: [id number]  Setting: Ambulatory outpatient  PCP: [place]  Type: Established Patient    Referring Provider: [facility] [place]  Location: Office  Delivery: Face-to-face     HPI  Rick. Rick HUMAN Sr., a 70 y.o. year old male, is here today because of his DDD (degenerative disc disease), cervical [M50.30]. Rick. Rick Carter primary complain today is Back Pain (Lumbar midline ) Last encounter: My last encounter with him was on 05/30/2021. Pertinent problems: Rick. Rick Carter has DDD (degenerative disc disease), cervical; Post-thoracotomy pain syndrome; Fibromyalgia; Chronic pain syndrome; Lumbar facet syndrome (Bilateral) (R>L); Chronic low back pain (1ry area of Pain) (midline); Lumbar spondylosis; Cervical spondylosis; Failed cervical surgery syndrome; Chronic musculoskeletal pain; Neurogenic pain; Neuropathic pain; Osteoarthritis; Thoracic spine pain; Thoracic radicular pain (Right); Chronic lumbar radicular pain (L5/S1 dermatome) (Right); Lumbar foraminal stenosis (L5-S1) (Right); Lumbar facet arthropathy (Bilateral) (R>L); Lumbar disc protrusion (L3-4) (Right); Chronic cervical radicular pain (4th area of Pain) (Left); Tumor of parotid gland; Trigger finger (middle finger) (Left); Pain of lumbar spine (L2-3 interspinous ligament); Chronic lower extremity pain (2ry area of Pain) (Right); DDD (degenerative  disc disease), lumbar; Failed back surgical syndrome; Chronic upper extremity pain (Left); DDD (degenerative disc disease), thoracic; Cervicalgia; Pain in finger of left hand; Malignant neoplasm of lower lobe of right lung ([place]); Cancer-related pain; and Chronic neck pain with history of cervical spinal surgery (3ry area of Pain) on their pertinent problem list. Pain Assessment: Severity of Chronic pain is reported as a 6 /10. Location: Back Lower, Mid/into right leg. Onset: More than a month ago. Quality: Discomfort, Constant, Nagging. Timing: Constant. Modifying factor(s): pain medications. Vitals:  height is _0  (1.753 m) and weight is 138 lb (62.6 kg). His temporal temperature is 97.1 F (36.2 C) (abnormal). His blood pressure is 170/75 (abnormal) and his pulse is 67. His respiration is 16 and oxygen saturation is 100%.   Reason for encounter: follow-up evaluation.   As it turns out, he was admitted to a rehab facility and they have taken over his medications.  For this reason, all of the prescriptions that we had written on his last visit we canceled them.  Another flareup of his low back pain.  Currently he is complaining of midline low back pain and pain going down to the top of the foot and what appears to be an L5 dermatomal distribution.  In the past, we have done right-sided L4-5 LESI with excellent results.  He describes that this is exactly the same pain.  He would like to have that LESI repeated.  Since I will be out of town next week I have scheduled him with Dr. Jannetta Quint having asked the patient if he would mind having the procedure done by my partner.  The patient indicated that there was no problem.  He seems to be doing much better after he was submitted to the rehab facility.  The plan is for them to continue prescribing his medications and once he is discharged  from the facility, then we will take over his opioids again.  RTCB: 10/05/2021  Pharmacotherapy Assessment   Analgesic: Oxycodone ER 10 mg, 1 tab PO TID (30 mg/day of oxycodone) + Oxycodone IR 5 mg, 1 tab PO q 6 hrs PRN for breakthrough pain (20 mg/day of oxycodone) for a total of 75 MME/day MME/day: 63 MME/day.   Monitoring: [place] PMP: PDMP reviewed during this encounter.       Pharmacotherapy: No side-effects or adverse reactions reported. Compliance: No problems identified. Effectiveness: Clinically acceptable.  Janett Billow, RN  06/13/2021  3:10 PM  Sign when Signing Visit Nursing Pain Medication Assessment:  Safety precautions to be maintained throughout the outpatient stay will include: orient to surroundings, keep bed in low position, maintain call bell within reach at all times, provide assistance with transfer out of bed and ambulation.  Medication Inspection Compliance: Rick Carter did not comply with our request to bring his pills to be counted. He was reminded that bringing the medication bottles, even when empty, is a requirement.  Medication: None brought in. Pill/Patch Count: None available to be counted. Bottle Appearance: No container available. Did not bring bottle(s) to appointment. Filled Date: N/A Last Medication intake:   na   Rick Carter has entered a rehabilitation facility for physical therapy and they are prescribing his pain medications.  FN has asked to call [facility] and cancel all remaining narcotic Rx's.  Done.     UDS:  Summary  Date Value Ref Range Status  05/29/2021 Note  Final    Comment:    ==================================================================== [employer] [facility] (MW) ==================================================================== Test                             Result       Flag       Units  Drug Present and Declared for Prescription Verification   Lorazepam                      1823         EXPECTED   ng/mg creat    Source of lorazepam is a scheduled prescription medication.    Oxycodone                      2503         EXPECTED    ng/mg creat   Oxymorphone                    639          EXPECTED   ng/mg creat   Noroxycodone                   >14085       EXPECTED   ng/mg creat   Noroxymorphone                 642          EXPECTED   ng/mg creat    Sources of oxycodone are scheduled prescription medications.    Oxymorphone, noroxycodone, and noroxymorphone are expected    metabolites of oxycodone. Oxymorphone is also available as a    scheduled prescription medication.  ==================================================================== Test                      Result    Flag   Units      Ref Range   Creatinine  71               mg/dL      >=20 ==================================================================== Declared Medications:  The flagging and interpretation on this report are based on the  following declared medications.  Unexpected results may arise from  inaccuracies in the declared medications.   **Note: The testing scope of this panel Carter these medications:   Lorazepam (Ativan)  Oxycodone   **Note: The testing scope of this panel does not include the  following reported medications:   Acetaminophen (Tylenol)  Acetylcysteine  Aclidinium  Albuterol  Albuterol (Duoneb)  Aspirin  Escitalopram (Lexapro)  Fluoxetine (Prozac)  Fluticasone (Advair)  Fluticasone (Trelegy)  Furosemide (Lasix)  Guaifenesin (Mucinex)  Hydrochlorothiazide (Microzide)  Hydrochlorothiazide (Hyzaar)  Hydroxyzine (Atarax)  Ipratropium (Duoneb)  Loratadine (Claritin)  Losartan (Hyzaar)  Lubiprostone (Amitiza)  Multivitamin  Pantoprazole (Protonix)  Pregabalin (Lyrica)  Salmeterol (Advair)  Tizanidine (Zanaflex)  Trazodone (Desyrel)  Umeclidinium (Trelegy)  Umeclidinium  Vilanterol (Trelegy)  Vitamin D ==================================================================== For clinical consultation, please call [phone]. ====================================================================      ROS  Constitutional: Denies any fever or chills Gastrointestinal: No reported hemesis, hematochezia, vomiting, or acute GI distress Musculoskeletal: Denies any acute onset joint swelling, redness, loss of ROM, or weakness Neurological: No reported episodes of acute onset apraxia, aphasia, dysarthria, agnosia, amnesia, paralysis, loss of coordination, or loss of consciousness  Medication Review  Aclidinium Bromide, FLUoxetine, Fluticasone-Salmeterol, Fluticasone-Umeclidin-Vilant, Lidocaine, acetaminophen, acetylcysteine, albuterol, aspirin EC, bacitracin, busPIRone, cholecalciferol, fluticasone-salmeterol, ipratropium-albuterol, losartan-hydrochlorothiazide, melatonin, polyethylene glycol, pregabalin, senna, and tiZANidine  History Review  Allergy: Rick. Carter is allergic to atorvastatin and varenicline. Drug: Rick Carter  has no history on file for drug use. Alcohol:  reports no history of alcohol use. Tobacco:  reports that he quit smoking about 2 years ago. His smoking use included cigarettes. He has never used smokeless tobacco. Social: Rick Carter  reports that he quit smoking about 2 years ago. His smoking use included cigarettes. He has never used smokeless tobacco. He reports that he does not drink alcohol. Medical:  has a past medical history of Anxiety, Bronchitis, Cancer ([place]), COPD (chronic obstructive pulmonary disease) ([place]), Depression, Hernia of abdominal wall, History of alcoholism ([place]) (08/08/2015), History of pneumonectomy (08/08/2015), Hypertension, Macular degeneration (08/26/2016), and Tumor cells. Surgical: Rick Carter  has a past surgical history that Carter Tonsillectomy; Lung removal, partial; and Neck surgery. Family: family history Carter Alcohol abuse in his father; Kidney disease in his mother.  Laboratory Chemistry Profile   Renal Lab Results  Component Value Date   BUN 9  04/06/2018   CREATININE 0.84 04/06/2018   BCR 11 04/06/2018   GFRAA 104 04/06/2018   GFRNONAA 90 04/06/2018    Hepatic Lab Results  Component Value Date   AST 18 04/06/2018   ALT 24 01/06/2017   ALBUMIN 4.6 04/06/2018   ALKPHOS 64 04/06/2018    Electrolytes Lab Results  Component Value Date   NA 144 04/06/2018   K 4.2 04/06/2018   CL 102 04/06/2018   CALCIUM 9.8 04/06/2018   MG 2.1 04/06/2018    Bone Lab Results  Component Value Date   25OHVITD1 76 04/06/2018   25OHVITD2 <1.0 04/06/2018   25OHVITD3 76 04/06/2018    Inflammation (CRP: Acute Phase) (ESR: Chronic Phase) Lab Results  Component Value Date   CRP 2 04/06/2018   ESRSEDRATE 4 04/06/2018         Note: Above Lab results reviewed.  Recent Imaging Review  Fluoro (C-Arm) (<60 min) (No  Report) Fluoro was used, but no Radiologist interpretation will be provided.  Please refer to "NOTES" tab for provider progress note. Note: Reviewed        Physical Exam  General appearance: Well nourished, well developed, and well hydrated. In no apparent acute distress Mental status: Alert, oriented x 3 (person, place, & time)       Respiratory: No evidence of acute respiratory distress Eyes: PERLA Vitals: BP (!) 170/75 (BP Location: Right Arm, Patient Position: Sitting, Cuff Size: Normal)   Pulse 67   Temp (!) 97.1 F (36.2 C) (Temporal)   Resp 16   Ht _0  (1.753 m)   Wt 138 lb (62.6 kg)   SpO2 100%   BMI 20.38 kg/m  BMI: Estimated body mass index is 20.38 kg/m as calculated from the following:   Height as of this encounter: _1  (1.753 m).   Weight as of this encounter: 138 lb (62.6 kg). Ideal: Ideal body weight: 70.7 kg (155 lb 13.8 oz)  Assessment   Status Diagnosis  Controlled Controlled Controlled 1. DDD (degenerative disc disease), cervical   2. DDD (degenerative disc disease), lumbar   3. DDD (degenerative disc disease), thoracic   4. Cancer-related pain   5. Chronic low back pain (1ry area of  Pain) (midline)   6. Chronic pain syndrome   7. Failed back surgical syndrome   8. Failed cervical surgery syndrome   9. Malignant neoplasm of lower lobe of right lung ([place])   10. Palliative care patient   8. Pharmacologic therapy   12. Chronic use of opiate for therapeutic purpose   13. Encounter for medication management      Updated Problems: Problem  Palliative Care Patient  Bilateral Inguinal Hernia  Basal Cell Carcinoma (Bcc) of Skin of Ear  Peripheral Neuropathy    Plan of Care  Problem-specific:  No problem-specific Assessment & Plan notes found for this encounter.  Rick. ELIZER BOSTIC Sr. has a current medication list which Carter the following long-term medication(s): tudorza pressair, advair diskus, albuterol, fluticasone-salmeterol, ipratropium-albuterol, losartan-hydrochlorothiazide, pregabalin, ventolin hfa, albuterol, fluticasone-salmeterol, and tizanidine.  Pharmacotherapy (Medications Ordered): No orders of the defined types were placed in this encounter.  Orders:  Orders Placed This Encounter  Procedures   Lumbar Epidural Injection    Standing Status:   Future    Standing Expiration Date:   07/13/2021    Scheduling Instructions:     Procedure: Interlaminar Lumbar Epidural Steroid injection (LESI)  L4-5     Laterality: Right-sided     Sedation: Patient's choice.     Timeframe: ASAA    Order Specific Question:   Where will this procedure be performed?    Answer:   [facility]   Informed Consent Details: Physician/Practitioner Attestation; Transcribe to consent form and obtain patient signature    Note: Always confirm laterality of pain with Rick Carter, before procedure. Transcribe to consent form and obtain patient signature.    Order Specific Question:   Physician/Practitioner attestation of informed consent for procedure/surgical case    Answer:   I, the physician/practitioner, attest that I have discussed with the patient the benefits, risks,  side effects, alternatives, likelihood of achieving goals and potential problems during recovery for the procedure that I have provided informed consent.    Order Specific Question:   Procedure    Answer:   Lumbar epidural steroid injection under fluoroscopic guidance    Order Specific Question:   Physician/Practitioner performing the procedure    Answer:  Myreon Wimer A. Dossie Arbour, MD    Order Specific Question:   Indication/Reason    Answer:   Low back and/or lower extremity pain secondary to lumbar radiculitis    Follow-up plan:   Return for (Clinic) procedure:, (Sed) (R) L4-5 LESI w/ Dr. Holley Raring.     Interventional management options:  Considering:   Diagnostic right L3 TFESI  Diagnostic right L5 TFESI    Palliative PRN treatment(s):   Palliative bilateral lumbar facet block #2  Palliative left cervical ESI #7  Palliative right T8-9 thoracic ESI #4  Palliative ML T6-7 thoracic ESI #3  Palliative right L5-S1 LESI #2  Palliative right L4-5 LESI #5  Palliative left hand, ring finger (No.:4)(A-4 pulley), trigger finger injection #4  Palliative ML L2-3 interspinous ligament injection #2        Recent Visits Date Type Provider Dept  05/29/21 Office Visit Milinda Pointer, MD [facility]  Showing recent visits within past 90 days and meeting all other requirements Today's Visits Date Type Provider Dept  06/13/21 Office Visit Milinda Pointer, MD [facility]  Showing today's visits and meeting all other requirements Future Appointments Date Type Provider Dept  06/25/21 Appointment Gillis Santa, MD [facility]  09/03/21 Appointment Milinda Pointer, MD [facility]  Showing future appointments within next 90 days and meeting all other requirements I discussed the assessment and treatment plan with the patient. The patient was provided an opportunity to ask questions and all were answered. The patient agreed with the plan and demonstrated  an understanding of the instructions.  Patient advised to call back or seek an in-person evaluation if the symptoms or condition worsens.  Duration of encounter: 30 minutes.  Note by: Gaspar Cola, MD Date: 06/13/2021; Time: 3:30 PM

## 2021-06-13 ENCOUNTER — Encounter: Payer: Self-pay | Admitting: Pain Medicine

## 2021-06-13 ENCOUNTER — Ambulatory Visit: Payer: Medicare Other | Attending: Pain Medicine | Admitting: Pain Medicine

## 2021-06-13 ENCOUNTER — Other Ambulatory Visit: Payer: Self-pay

## 2021-06-13 VITALS — BP 170/75 | HR 67 | Temp 97.1°F | Resp 16 | Ht 69.0 in | Wt 138.0 lb

## 2021-06-13 DIAGNOSIS — G893 Neoplasm related pain (acute) (chronic): Secondary | ICD-10-CM

## 2021-06-13 DIAGNOSIS — M5441 Lumbago with sciatica, right side: Secondary | ICD-10-CM | POA: Insufficient documentation

## 2021-06-13 DIAGNOSIS — M5010 Cervical disc disorder with radiculopathy, unspecified cervical region: Secondary | ICD-10-CM | POA: Diagnosis present

## 2021-06-13 DIAGNOSIS — G8929 Other chronic pain: Secondary | ICD-10-CM

## 2021-06-13 DIAGNOSIS — M51369 Other intervertebral disc degeneration, lumbar region without mention of lumbar back pain or lower extremity pain: Secondary | ICD-10-CM

## 2021-06-13 DIAGNOSIS — G894 Chronic pain syndrome: Secondary | ICD-10-CM

## 2021-06-13 DIAGNOSIS — M5134 Other intervertebral disc degeneration, thoracic region: Secondary | ICD-10-CM

## 2021-06-13 DIAGNOSIS — Z79899 Other long term (current) drug therapy: Secondary | ICD-10-CM | POA: Diagnosis not present

## 2021-06-13 DIAGNOSIS — M961 Postlaminectomy syndrome, not elsewhere classified: Secondary | ICD-10-CM | POA: Diagnosis not present

## 2021-06-13 DIAGNOSIS — C3431 Malignant neoplasm of lower lobe, right bronchus or lung: Secondary | ICD-10-CM | POA: Diagnosis not present

## 2021-06-13 DIAGNOSIS — Z87891 Personal history of nicotine dependence: Secondary | ICD-10-CM | POA: Diagnosis not present

## 2021-06-13 DIAGNOSIS — Z515 Encounter for palliative care: Secondary | ICD-10-CM

## 2021-06-13 DIAGNOSIS — M5030 Other cervical disc degeneration, unspecified cervical region: Secondary | ICD-10-CM | POA: Diagnosis not present

## 2021-06-13 DIAGNOSIS — Z79891 Long term (current) use of opiate analgesic: Secondary | ICD-10-CM

## 2021-06-13 DIAGNOSIS — M5136 Other intervertebral disc degeneration, lumbar region: Secondary | ICD-10-CM

## 2021-06-13 DIAGNOSIS — M797 Fibromyalgia: Secondary | ICD-10-CM | POA: Diagnosis not present

## 2021-06-13 NOTE — Progress Notes (Signed)
Nursing Pain Medication Assessment:  Safety precautions to be maintained throughout the outpatient stay will include: orient to surroundings, keep bed in low position, maintain call bell within reach at all times, provide assistance with transfer out of bed and ambulation.  Medication Inspection Compliance: Mr. Sane did not comply with our request to bring his pills to be counted. He was reminded that bringing the medication bottles, even when empty, is a requirement.  Medication: None brought in. Pill/Patch Count: None available to be counted. Bottle Appearance: No container available. Did not bring bottle(s) to appointment. Filled Date: N/A Last Medication intake:   na   Mr Noy has entered a rehabilitation facility for physical therapy and they are prescribing his pain medications.  FN has asked to call [facility] and cancel all remaining narcotic Rx's.  Done.

## 2021-06-13 NOTE — Patient Instructions (Addendum)
____________________________________________________________________________________________  General Risks and Possible Complications  Patient Responsibilities: It is important that you read this as it is part of your informed consent. It is our duty to inform you of the risks and possible complications associated with treatments offered to you. It is your responsibility as a patient to read this and to ask questions about anything that is not clear or that you believe was not covered in this document.  Patient's Rights: You have the right to refuse treatment. You also have the right to change your mind, even after initially having agreed to have the treatment done. However, under this last option, if you wait until the last second to change your mind, you may be charged for the materials used up to that point.  Introduction: Medicine is not an Chief Strategy Officer. Everything in Medicine, including the lack of treatment(s), carries the potential for danger, harm, or loss (which is by definition: Risk). In Medicine, a complication is a secondary problem, condition, or disease that can aggravate an already existing one. All treatments carry the risk of possible complications. The fact that a side effects or complications occurs, does not imply that the treatment was conducted incorrectly. It must be clearly understood that these can happen even when everything is done following the highest safety standards.  No treatment: You can choose not to proceed with the proposed treatment alternative. The "PRO(s)" would include: avoiding the risk of complications associated with the therapy. The "CON(s)" would include: not getting any of the treatment benefits. These benefits fall under one of three categories: diagnostic; therapeutic; and/or palliative. Diagnostic benefits include: getting information which can ultimately lead to improvement of the disease or symptom(s). Therapeutic benefits are those associated with the  successful treatment of the disease. Finally, palliative benefits are those related to the decrease of the primary symptoms, without necessarily curing the condition (example: decreasing the pain from a flare-up of a chronic condition, such as incurable terminal cancer).  General Risks and Complications: These are associated to most interventional treatments. They can occur alone, or in combination. They fall under one of the following six (6) categories: no benefit or worsening of symptoms; bleeding; infection; nerve damage; allergic reactions; and/or death. No benefits or worsening of symptoms: In Medicine there are no guarantees, only probabilities. No healthcare provider can ever guarantee that a medical treatment will work, they can only state the probability that it may. Furthermore, there is always the possibility that the condition may worsen, either directly, or indirectly, as a consequence of the treatment. Bleeding: This is more common if the patient is taking a blood thinner, either prescription or over the counter (example: Goody Powders, Fish oil, Aspirin, Garlic, etc.), or if suffering a condition associated with impaired coagulation (example: Hemophilia, cirrhosis of the liver, low platelet counts, etc.). However, even if you do not have one on these, it can still happen. If you have any of these conditions, or take one of these drugs, make sure to notify your treating physician. Infection: This is more common in patients with a compromised immune system, either due to disease (example: diabetes, cancer, human immunodeficiency virus [HIV], etc.), or due to medications or treatments (example: therapies used to treat cancer and rheumatological diseases). However, even if you do not have one on these, it can still happen. If you have any of these conditions, or take one of these drugs, make sure to notify your treating physician. Nerve Damage: This is more common when the treatment is an invasive  one, but it can also happen with the use of medications, such as those used in the treatment of cancer. The damage can occur to small secondary nerves, or to large primary ones, such as those in the spinal cord and brain. This damage may be temporary or permanent and it may lead to impairments that can range from temporary numbness to permanent paralysis and/or brain death. Allergic Reactions: Any time a substance or material comes in contact with our body, there is the possibility of an allergic reaction. These can range from a mild skin rash (contact dermatitis) to a severe systemic reaction (anaphylactic reaction), which can result in death. Death: In general, any medical intervention can result in death, most of the time due to an unforeseen complication. ____________________________________________________________________________________________ Epidural Steroid Injection Patient Information  Description: The epidural space surrounds the nerves as they exit the spinal cord.  In some patients, the nerves can be compressed and inflamed by a bulging disc or a tight spinal canal (spinal stenosis).  By injecting steroids into the epidural space, we can bring irritated nerves into direct contact with a potentially helpful medication.  These steroids act directly on the irritated nerves and can reduce swelling and inflammation which often leads to decreased pain.  Epidural steroids may be injected anywhere along the spine and from the neck to the low back depending upon the location of your pain.   After numbing the skin with local anesthetic (like Novocaine), a small needle is passed into the epidural space slowly.  You may experience a sensation of pressure while this is being done.  The entire block usually last less than 10 minutes.  Conditions which may be treated by epidural steroids:  Low back and leg pain Neck and arm pain Spinal stenosis Post-laminectomy syndrome Herpes zoster (shingles)  pain Pain from compression fractures  Preparation for the injection:  Do not eat any solid food or dairy products within 8 hours of your appointment.  You may drink clear liquids up to 3 hours before appointment.  Clear liquids include water, black coffee, juice or soda.  No milk or cream please. You may take your regular medication, including pain medications, with a sip of water before your appointment  Diabetics should hold regular insulin (if taken separately) and take 1/2 normal NPH dos the morning of the procedure.  Carry some sugar containing items with you to your appointment. A driver must accompany you and be prepared to drive you home after your procedure.  Bring all your current medications with your. An IV may be inserted and sedation may be given at the discretion of the physician.   A blood pressure cuff, EKG and other monitors will often be applied during the procedure.  Some patients may need to have extra oxygen administered for a short period. You will be asked to provide medical information, including your allergies, prior to the procedure.  We must know immediately if you are taking blood thinners (like Coumadin/Warfarin)  Or if you are allergic to IV iodine contrast (dye). We must know if you could possible be pregnant.  Possible side-effects: Bleeding from needle site Infection (rare, may require surgery) Nerve injury (rare) Numbness & tingling (temporary) Difficulty urinating (rare, temporary) Spinal headache ( a headache worse with upright posture) Light -headedness (temporary) Pain at injection site (several days) Decreased blood pressure (temporary) Weakness in arm/leg (temporary) Pressure sensation in back/neck (temporary)  Call if you experience: Fever/chills associated with headache or increased back/neck pain. Headache worsened by  an upright position. New onset weakness or numbness of an extremity below the injection site Hives or difficulty breathing (go  to the emergency room) Inflammation or drainage at the infection site Severe back/neck pain Any new symptoms which are concerning to you  Please note:  Although the local anesthetic injected can often make your back or neck feel good for several hours after the injection, the pain will likely return.  It takes 3-7 days for steroids to work in the epidural space.  You may not notice any pain relief for at least that one week.  If effective, we will often do a series of three injections spaced 3-6 weeks apart to maximally decrease your pain.  After the initial series, we generally will wait several months before considering a repeat injection of the same type.  If you have any questions, please call [phone] [facility]______________________________________________________________________  Preparing for Procedure with Sedation  NOTICE: Due to recent regulatory changes, starting on May 14, 2021, procedures requiring intravenous (IV) sedation will no longer be performed at the [facility].  These types of procedures are required to be performed at [facility] ambulatory surgery facility.  We are very sorry for the inconvenience.  Procedure appointments are limited to planned procedures: No Prescription Refills. No disability issues will be discussed. No medication changes will be discussed.  Instructions: Oral Intake: Do not eat or drink anything for at least 8 hours prior to your procedure. (Exception: Blood Pressure Medication. See below.) Transportation: A driver is required. You may not drive yourself after the procedure. Blood Pressure Medicine: Do not forget to take your blood pressure medicine with a sip of water the morning of the procedure. If your Diastolic (lower reading) is above 100 mmHg, elective cases will be cancelled/rescheduled. Blood thinners: These will need to be stopped for procedures. Notify our staff if you are taking any  blood thinners. Depending on which one you take, there will be specific instructions on how and when to stop it. Diabetics on insulin: Notify the staff so that you can be scheduled 1st case in the morning. If your diabetes requires high dose insulin, take only  of your normal insulin dose the morning of the procedure and notify the staff that you have done so. Preventing infections: Shower with an antibacterial soap the morning of your procedure. Build-up your immune system: Take 1000 mg of Vitamin C with every meal (3 times a day) the day prior to your procedure. Antibiotics: Inform the staff if you have a condition or reason that requires you to take antibiotics before dental procedures. Pregnancy: If you are pregnant, call and cancel the procedure. Sickness: If you have a cold, fever, or any active infections, call and cancel the procedure. Arrival: You must be in the facility at least 30 minutes prior to your scheduled procedure. Children: Do not bring children with you. Dress appropriately: Bring dark clothing that you would not mind if they get stained. Valuables: Do not bring any jewelry or valuables.  Reasons to call and reschedule or cancel your procedure: (Following these recommendations will minimize the risk of a serious complication.) Surgeries: Avoid having procedures within 2 weeks of any surgery. (Avoid for 2 weeks before or after any surgery). Flu Shots: Avoid having procedures within 2 weeks of a flu shots. (Avoid for 2 weeks before or after immunizations). Barium: Avoid having a procedure within 7-10 days after having had a radiological study involving the use of radiological contrast. (  Myelograms, Barium swallow or enema study). Heart attacks: Avoid any elective procedures or surgeries for the initial 6 months after a "Myocardial Infarction" (Heart Attack). Blood thinners: It is imperative that you stop these medications before procedures. Let us know if you if you take any  blood thinner.  Infection: Avoid procedures during or within two weeks of an infection (including chest colds or gastrointestinal problems). Symptoms associated with infections include: Localized redness, fever, chills, night sweats or profuse sweating, burning sensation when voiding, cough, congestion, stuffiness, runny nose, sore throat, diarrhea, nausea, vomiting, cold or Flu symptoms, recent or current infections. It is specially important if the infection is over the area that we intend to treat. Heart and lung problems: Symptoms that may suggest an active cardiopulmonary problem include: cough, chest pain, breathing difficulties or shortness of breath, dizziness, ankle swelling, uncontrolled high or unusually low blood pressure, and/or palpitations. If you are experiencing any of these symptoms, cancel your procedure and contact your primary care physician for an evaluation.  Remember:  Regular Business hours are:  Monday to Thursday 8:00 AM to 4:00 PM  Provider's Schedule: Milinda Pointer, MD:  Procedure days: Tuesday and Thursday 7:30 AM to 4:00 PM  Gillis Santa, MD:  Procedure days: Monday and Wednesday 7:30 AM to 4:00 PM ______________________________________________________________________

## 2021-06-25 ENCOUNTER — Ambulatory Visit: Payer: Medicare Other | Admitting: Student in an Organized Health Care Education/Training Program

## 2021-06-30 NOTE — Progress Notes (Signed)
PROVIDER NOTE: Information contained herein reflects review and annotations entered in association with encounter. Interpretation of such information and data should be left to medically-trained personnel. Information provided to patient can be located elsewhere in the medical record under "Patient Instructions". Document created using STT-dictation technology, any transcriptional errors that may result from process are unintentional.    Patient: Rick Patrick Sr.  Service Category: Procedure  Provider: Gaspar Cola, MD  DOB: 08-Jan-1951  DOS: 07/03/2021  Location: [facility]  MRN: [id number]  Setting: Ambulatory - outpatient  Referring Provider: [facility]  Type: Established Patient  Specialty: Interventional Pain Management  PCP: [facility], [facility]   Primary Reason for Visit: Interventional Pain Management Treatment. CC: Back Pain (lower)   Procedure:          Anesthesia, Analgesia, Anxiolysis:  Type: Therapeutic Inter-Laminar Epidural Steroid Injection           Region: Lumbar Level: L4-5 Level. Laterality: Right         Type: Local Anesthesia Local Anesthetic: Lidocaine 1-2% Sedation: Minimal Anxiolysis  Indication(s): Anxiety & Analgesia Route: Infiltration ([place]/IM) IV Access: Available   Position: Prone with head of the table was raised to facilitate breathing.   Indications: 1. DDD (degenerative disc disease), lumbar   2. Chronic lumbar radicular pain (L5/S1 dermatome) (Right)   3. 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 Lumbar facet arthropathy (Bilateral) (R>L)   5. Lumbar foraminal stenosis (L5-S1) (Right)   6. Lumbar spondylosis   7. Failed back surgical syndrome   8. Chronic low back pain (1ry area of Pain) (midline)   9. Chronic lower extremity pain (2ry area of Pain) (Right)    Pain Score: Pre-procedure: 7 /10 Post-procedure: 4 /10    Today the patient came into the clinic with a skin lesion (scraped skin) over the middle of the  back, several levels above and away from the planned injection site therefore there were no contraindications to the injection.  However, he seems to have scraped it recently.  There was no evidence of infection.  The area was cleaned and draped with sterile dressings.  Today's visit was a little bit more extensive than normal due to the fact that we also had to do a patient's medication management encounter in addition to the procedure encounter.  Pharmacotherapy Assessment  Analgesic: Oxycodone ER 20 mg, 1 tab PO BID (40 mg/day of oxycodone) + Oxycodone IR 5 mg, 1 tab PO q 6 hrs PRN for breakthrough pain (20 mg/day of oxycodone) (total: 60 mg/day of oxycodone) MME/day: 90 MME/day.   Monitoring: [place] PMP: PDMP reviewed during this encounter.       Pharmacotherapy: No side-effects or adverse reactions reported. Compliance: No problems identified. Effectiveness: Clinically acceptable.  UDS:  Summary  Date Value Ref Range Status  05/29/2021 Note  Final    Comment:    ==================================================================== [employer] Select 13 (MW) ==================================================================== Test                             Result       Flag       Units  Drug Present and Declared for Prescription Verification   Lorazepam                      1823         EXPECTED   ng/mg creat    Source of lorazepam is a scheduled  prescription medication.    Oxycodone                      2503         EXPECTED   ng/mg creat   Oxymorphone                    639          EXPECTED   ng/mg creat   Noroxycodone                   >14085       EXPECTED   ng/mg creat   Noroxymorphone                 642          EXPECTED   ng/mg creat    Sources of oxycodone are scheduled prescription medications.    Oxymorphone, noroxycodone, and noroxymorphone are expected    metabolites of oxycodone. Oxymorphone is also available as a    scheduled prescription  medication.  ==================================================================== Test                      Result    Flag   Units      Ref Range   Creatinine              71               mg/dL      >=20 ==================================================================== Declared Medications:  The flagging and interpretation on this report are based on the  following declared medications.  Unexpected results may arise from  inaccuracies in the declared medications.   **Note: The testing scope of this panel includes these medications:   Lorazepam (Ativan)  Oxycodone   **Note: The testing scope of this panel does not include the  following reported medications:   Acetaminophen (Tylenol)  Acetylcysteine  Aclidinium  Albuterol  Albuterol (Duoneb)  Aspirin  Escitalopram (Lexapro)  Fluoxetine (Prozac)  Fluticasone (Advair)  Fluticasone (Trelegy)  Furosemide (Lasix)  Guaifenesin (Mucinex)  Hydrochlorothiazide (Microzide)  Hydrochlorothiazide (Hyzaar)  Hydroxyzine (Atarax)  Ipratropium (Duoneb)  Loratadine (Claritin)  Losartan (Hyzaar)  Lubiprostone (Amitiza)  Multivitamin  Pantoprazole (Protonix)  Pregabalin (Lyrica)  Salmeterol (Advair)  Tizanidine (Zanaflex)  Trazodone (Desyrel)  Umeclidinium (Trelegy)  Umeclidinium  Vilanterol (Trelegy)  Vitamin D ==================================================================== For clinical consultation, please call [phone]. ====================================================================      Pre-op H&P Assessment:  Rick Carter is a 70 y.o. (year old), male patient, seen today for interventional treatment. He  has a past surgical history that includes Tonsillectomy; Lung removal, partial; and Neck surgery. Rick Carter has a current medication list which includes the following prescription(s): acetaminophen, acetylcysteine, tudorza pressair, advair diskus, albuterol, aspirin ec, bacitracin, buspirone, cholecalciferol,  cholecalciferol, fluoxetine, fluticasone-salmeterol, trelegy ellipta, ipratropium-albuterol, lidocaine, losartan-hydrochlorothiazide, melatonin, oxycodone, [START ON 08/02/2021] oxycodone, [START ON 09/01/2021] oxycodone, oxycodone, [START ON 08/02/2021] oxycodone, [START ON 09/01/2021] oxycodone, polyethylene glycol, pregabalin, senna, ventolin hfa, albuterol, fluticasone-salmeterol, and tizanidine. His primarily concern today is the Back Pain (lower)  Initial Vital Signs:  Pulse/HCG Rate: 72ECG Heart Rate: 80 Temp: 97.9 F (36.6 C) Resp: 16 BP: (!) 166/83 SpO2: 100 % (O2 at 4 liters)  BMI: Estimated body mass index is 20.38 kg/m as calculated from the following:   Height as of this encounter: _0  (1.753 m).   Weight as of this encounter: 138 lb (62.6 kg).  Risk Assessment: Allergies: Reviewed. He is allergic  to atorvastatin and varenicline.  Allergy Precautions: None required Coagulopathies: Reviewed. None identified.  Blood-thinner therapy: None at this time Active Infection(s): Reviewed. None identified. Rick Carter is afebrile  Site Confirmation: Rick Carter was asked to confirm the procedure and laterality before marking the site Procedure checklist: Completed Consent: Before the procedure and under the influence of no sedative(s), amnesic(s), or anxiolytics, the patient was informed of the treatment options, risks and possible complications. To fulfill our ethical and legal obligations, as recommended by the American Medical Association's Code of Ethics, I have informed the patient of my clinical impression; the nature and purpose of the treatment or procedure; the risks, benefits, and possible complications of the intervention; the alternatives, including doing nothing; the risk(s) and benefit(s) of the alternative treatment(s) or procedure(s); and the risk(s) and benefit(s) of doing nothing. The patient was provided information about the general risks and possible complications  associated with the procedure. These may include, but are not limited to: failure to achieve desired goals, infection, bleeding, organ or nerve damage, allergic reactions, paralysis, and death. In addition, the patient was informed of those risks and complications associated to Spine-related procedures, such as failure to decrease pain; infection (i.e.: Meningitis, epidural or intraspinal abscess); bleeding (i.e.: epidural hematoma, subarachnoid hemorrhage, or any other type of intraspinal or peri-dural bleeding); organ or nerve damage (i.e.: Any type of peripheral nerve, nerve root, or spinal cord injury) with subsequent damage to sensory, motor, and/or autonomic systems, resulting in permanent pain, numbness, and/or weakness of one or several areas of the body; allergic reactions; (i.e.: anaphylactic reaction); and/or death. Furthermore, the patient was informed of those risks and complications associated with the medications. These include, but are not limited to: allergic reactions (i.e.: anaphylactic or anaphylactoid reaction(s)); adrenal axis suppression; blood sugar elevation that in diabetics may result in ketoacidosis or comma; water retention that in patients with history of congestive heart failure may result in shortness of breath, pulmonary edema, and decompensation with resultant heart failure; weight gain; swelling or edema; medication-induced neural toxicity; particulate matter embolism and blood vessel occlusion with resultant organ, and/or nervous system infarction; and/or aseptic necrosis of one or more joints. Finally, the patient was informed that Medicine is not an exact science; therefore, there is also the possibility of unforeseen or unpredictable risks and/or possible complications that may result in a catastrophic outcome. The patient indicated having understood very clearly. We have given the patient no guarantees and we have made no promises. Enough time was given to the patient to  ask questions, all of which were answered to the patient's satisfaction. Rick Carter has indicated that he wanted to continue with the procedure. Attestation: I, the ordering provider, attest that I have discussed with the patient the benefits, risks, side-effects, alternatives, likelihood of achieving goals, and potential problems during recovery for the procedure that I have provided informed consent. Date  Time: 07/03/2021  8:20 AM  Pre-Procedure Preparation:  Monitoring: As per clinic protocol. Respiration, ETCO2, SpO2, BP, heart rate and rhythm monitor placed and checked for adequate function Safety Precautions: Patient was assessed for positional comfort and pressure points before starting the procedure. Time-out: I initiated and conducted the "Time-out" before starting the procedure, as per protocol. The patient was asked to participate by confirming the accuracy of the "Time Out" information. Verification of the correct person, site, and procedure were performed and confirmed by me, the nursing staff, and the patient. "Time-out" conducted as per Joint Commission's Universal Protocol (UP.01.01.01). Time: 4098  Description  of Procedure:          Target Area: The interlaminar space, initially targeting the lower laminar border of the superior vertebral body. Approach: Paramedial approach. Area Prepped: Entire Posterior Lumbar Region DuraPrep (Iodine Povacrylex [0.7% available iodine] and Isopropyl Alcohol, 74% w/w) Safety Precautions: Aspiration looking for blood return was conducted prior to all injections. At no point did we inject any substances, as a needle was being advanced. No attempts were made at seeking any paresthesias. Safe injection practices and needle disposal techniques used. Medications properly checked for expiration dates. SDV (single dose vial) medications used. Description of the Procedure: Protocol guidelines were followed. The procedure needle was introduced through the  skin, ipsilateral to the reported pain, and advanced to the target area. Bone was contacted and the needle walked caudad, until the lamina was cleared. The epidural space was identified using "loss-of-resistance technique" with 2-3 ml of PF-NaCl (0.9% NSS), in a 5cc LOR glass syringe.  Vitals:   07/03/21 0856 07/03/21 0901 07/03/21 0903 07/03/21 0913  BP: (!) 177/108 (!) 183/101 (!) 179/91 (!) 187/92  Pulse:    69  Resp: _0 Temp:      TempSrc:      SpO2: 100% 100% 100% 100%  Weight:      Height:        Start Time: 0854 hrs. End Time: 0902 hrs.  Materials:  Needle(s) Type: Epidural needle Gauge: 17G Length: 3.5-in Medication(s): Please see orders for medications and dosing details.  Imaging Guidance (Spinal):          Type of Imaging Technique: Fluoroscopy Guidance (Spinal) Indication(s): Assistance in needle guidance and placement for procedures requiring needle placement in or near specific anatomical locations not easily accessible without such assistance. Exposure Time: Please see nurses notes. Contrast: Before injecting any contrast, we confirmed that the patient did not have an allergy to iodine, shellfish, or radiological contrast. Once satisfactory needle placement was completed at the desired level, radiological contrast was injected. Contrast injected under live fluoroscopy. No contrast complications. See chart for type and volume of contrast used. Fluoroscopic Guidance: I was personally present during the use of fluoroscopy. "Tunnel Vision Technique" used to obtain the best possible view of the target area. Parallax error corrected before commencing the procedure. "Direction-depth-direction" technique used to introduce the needle under continuous pulsed fluoroscopy. Once target was reached, antero-posterior, oblique, and lateral fluoroscopic projection used confirm needle placement in all planes. Images permanently stored in EMR. Interpretation: I personally  interpreted the imaging intraoperatively. Adequate needle placement confirmed in multiple planes. Appropriate spread of contrast into desired area was observed. No evidence of afferent or efferent intravascular uptake. No intrathecal or subarachnoid spread observed. Permanent images saved into the patient's record.  Antibiotic Prophylaxis:   Anti-infectives (From admission, onward)    None      Indication(s): None identified  Post-operative Assessment:  Post-procedure Vital Signs:  Pulse/HCG Rate: 6973 Temp: 97.9 F (36.6 C) Resp: 14 BP: (!) 187/92 SpO2: 100 %  EBL: None  Complications: No immediate post-treatment complications observed by team, or reported by patient.  Note: The patient tolerated the entire procedure well. A repeat set of vitals were taken after the procedure and the patient was kept under observation following institutional policy, for this type of procedure. Post-procedural neurological assessment was performed, showing return to baseline, prior to discharge. The patient was provided with post-procedure discharge instructions, including a section on how to identify potential problems. Should any problems arise concerning  this procedure, the patient was given instructions to immediately contact us, at any time, without hesitation. In any case, we plan to contact the patient by telephone for a follow-up status report regarding this interventional procedure.  Comments:  No additional relevant information.  Plan of Care  Orders:  Orders Placed This Encounter  Procedures   Lumbar Epidural Injection    Scheduling Instructions:     Procedure: Interlaminar LESI L4-5     Laterality: Right-sided     Sedation: Patient's choice     Timeframe:  Today    Order Specific Question:   Where will this procedure be performed?    Answer:   [facility]   DG PAIN [facility] C-ARM 1-60 MIN NO REPORT    Intraoperative interpretation by procedural physician at [employer].    Standing Status:   Standing    Number of Occurrences:   1    Order Specific Question:   Reason for exam:    Answer:   Assistance in needle guidance and placement for procedures requiring needle placement in or near specific anatomical locations not easily accessible without such assistance.   Informed Consent Details: Physician/Practitioner Attestation; Transcribe to consent form and obtain patient signature    Note: Always confirm laterality of pain with Rick Carter, before procedure. Transcribe to consent form and obtain patient signature.    Order Specific Question:   Physician/Practitioner attestation of informed consent for procedure/surgical case    Answer:   I, the physician/practitioner, attest that I have discussed with the patient the benefits, risks, side effects, alternatives, likelihood of achieving goals and potential problems during recovery for the procedure that I have provided informed consent.    Order Specific Question:   Procedure    Answer:   Lumbar epidural steroid injection under fluoroscopic guidance    Order Specific Question:   Physician/Practitioner performing the procedure    Answer:   Maedell Hedger A. Dossie Arbour, MD    Order Specific Question:   Indication/Reason    Answer:   Low back and/or lower extremity pain secondary to lumbar radiculitis   Provide equipment / supplies at bedside    "Epidural Tray" (Disposable  single use) Catheter: NOT required    Standing Status:   Standing    Number of Occurrences:   1    Order Specific Question:   Specify    Answer:   Epidural Tray    Chronic Opioid Analgesic:  Oxycodone ER 10 mg, 1 tab PO TID (30 mg/day of oxycodone) + Oxycodone IR 5 mg, 1 tab PO q 6 hrs PRN for breakthrough pain (20 mg/day of oxycodone) for a total of 75 MME/day MME/day: 48 MME/day.   Medications ordered for procedure: Meds ordered this encounter  Medications   iohexol (OMNIPAQUE) 180 MG/ML injection 10 mL    Must be Myelogram-compatible.  If not available, you may substitute with a water-soluble, non-ionic, hypoallergenic, myelogram-compatible radiological contrast medium.   lidocaine (XYLOCAINE) 2 % (with pres) injection 400 mg   lactated ringers infusion 1,000 mL   midazolam (VERSED) 5 MG/5ML injection 0.5-2 mg    Make sure Flumazenil is available in the pyxis when using this medication. If oversedation occurs, administer 0.2 mg IV over 15 sec. If after 45 sec no response, administer 0.2 mg again over 1 min; may repeat at 1 min intervals; not to exceed 4 doses (1 mg)   sodium chloride flush (NS) 0.9 % injection 2 mL   ropivacaine (PF) 2 mg/mL (0.2%) (  NAROPIN) injection 2 mL   triamcinolone acetonide (KENALOG-40) injection 40 mg   oxyCODONE (OXYCONTIN) 20 mg 12 hr tablet    Sig: Take 1 tablet (20 mg total) by mouth every 12 (twelve) hours. Months last 30 days.    Dispense:  60 tablet    Refill:  0    Chronic Pain: STOP Act (Not applicable) Fill 1 day early if closed on refill date. Avoid benzodiazepines within 8 hours of opioids   oxyCODONE (OXY IR/ROXICODONE) 5 MG immediate release tablet    Sig: Take 1 tablet (5 mg total) by mouth every 6 (six) hours as needed for severe pain or breakthrough pain. Must last 30 days. Max: 4/day    Dispense:  120 tablet    Refill:  0    Not a duplicate. Do NOT delete! Dispense 1 day early if closed on fill date. Warn: no CNS-depressants within 8 hrs of opioid. Do not send refill request. Renewal requires appointment. No partial fills allowed   oxyCODONE (OXY IR/ROXICODONE) 5 MG immediate release tablet    Sig: Take 1 tablet (5 mg total) by mouth every 6 (six) hours as needed for severe pain or breakthrough pain. Must last 30 days. Max: 4/day    Dispense:  120 tablet    Refill:  0    Not a duplicate. Do NOT delete! Dispense 1 day early if closed on fill date. Warn: no CNS-depressants within 8 hrs of opioid. Do not send refill request. Renewal requires appointment. No partial fills allowed    oxyCODONE (OXY IR/ROXICODONE) 5 MG immediate release tablet    Sig: Take 1 tablet (5 mg total) by mouth every 6 (six) hours as needed for severe pain or breakthrough pain. Must last 30 days. Max: 4/day    Dispense:  120 tablet    Refill:  0    Not a duplicate. Do NOT delete! Dispense 1 day early if closed on fill date. Warn: no CNS-depressants within 8 hrs of opioid. Do not send refill request. Renewal requires appointment. No partial fills allowed   oxyCODONE (OXYCONTIN) 20 mg 12 hr tablet    Sig: Take 1 tablet (20 mg total) by mouth every 12 (twelve) hours. Months last 30 days.    Dispense:  60 tablet    Refill:  0    Chronic Pain: STOP Act (Not applicable) Fill 1 day early if closed on refill date. Avoid benzodiazepines within 8 hours of opioids   oxyCODONE (OXYCONTIN) 20 mg 12 hr tablet    Sig: Take 1 tablet (20 mg total) by mouth every 12 (twelve) hours. Months last 30 days.    Dispense:  60 tablet    Refill:  0    Chronic Pain: STOP Act (Not applicable) Fill 1 day early if closed on refill date. Avoid benzodiazepines within 8 hours of opioids    Medications administered: We administered iohexol, lidocaine, lactated ringers, midazolam, sodium chloride flush, ropivacaine (PF) 2 mg/mL (0.2%), and triamcinolone acetonide.  See the medical record for exact dosing, route, and time of administration.  Follow-up plan:   Return in about 2 weeks (around 07/17/2021) for Proc-day (T,Th), (VV), (PPE).       Interventional management options:  Considering:   Diagnostic right L3 TFESI  Diagnostic right L5 TFESI    Palliative PRN treatment(s):   Palliative bilateral lumbar facet block #2  Palliative left cervical ESI #7  Palliative right T8-9 thoracic ESI #4  Palliative ML T6-7 thoracic ESI #3  Palliative right L5-S1  LESI #2  Palliative right L4-5 LESI #5  Palliative left hand, ring finger (No.:4)(A-4 pulley), trigger finger injection #4  Palliative ML L2-3 interspinous ligament injection  #2         Recent Visits Date Type Provider Dept  06/13/21 Office Visit Milinda Pointer, MD [facility]  05/29/21 Office Visit Milinda Pointer, MD [facility]  Showing recent visits within past 90 days and meeting all other requirements Today's Visits Date Type Provider Dept  07/03/21 Procedure visit Milinda Pointer, MD [facility]  Showing today's visits and meeting all other requirements Future Appointments Date Type Provider Dept  07/19/21 Appointment Milinda Pointer, MD [facility]  09/26/21 Appointment Milinda Pointer, MD [facility]  Showing future appointments within next 90 days and meeting all other requirements Disposition: Discharge home  Discharge (Date  Time): 07/03/2021; 0930 hrs.   Primary Care Physician: [facility] [place] Location: [facility] Note by: Gaspar Cola, MD Date: 07/03/2021; Time: 9:23 AM  Disclaimer:  Medicine is not an Chief Strategy Officer. The only guarantee in medicine is that nothing is guaranteed. It is important to note that the decision to proceed with this intervention was based on the information collected from the patient. The Data and conclusions were drawn from the patient's questionnaire, the interview, and the physical examination. Because the information was provided in large part by the patient, it cannot be guaranteed that it has not been purposely or unconsciously manipulated. Every effort has been made to obtain as much relevant data as possible for this evaluation. It is important to note that the conclusions that lead to this procedure are derived in large part from the available data. Always take into account that the treatment will also be dependent on availability of resources and existing treatment guidelines, considered by other Pain Management Practitioners as being common knowledge and practice, at the time of the intervention. For  Medico-Legal purposes, it is also important to point out that variation in procedural techniques and pharmacological choices are the acceptable norm. The indications, contraindications, technique, and results of the above procedure should only be interpreted and judged by a Board-Certified Interventional Pain Specialist with extensive familiarity and expertise in the same exact procedure and technique.

## 2021-07-02 ENCOUNTER — Ambulatory Visit: Payer: Medicare Other | Admitting: Student in an Organized Health Care Education/Training Program

## 2021-07-03 ENCOUNTER — Ambulatory Visit
Admission: RE | Admit: 2021-07-03 | Discharge: 2021-07-03 | Disposition: A | Discharge status: Home / Self Care | Payer: Medicare Other | Source: Ambulatory Visit | Attending: Pain Medicine | Admitting: Pain Medicine

## 2021-07-03 ENCOUNTER — Other Ambulatory Visit: Payer: Self-pay

## 2021-07-03 ENCOUNTER — Ambulatory Visit (HOSPITAL_BASED_OUTPATIENT_CLINIC_OR_DEPARTMENT_OTHER): Payer: Medicare Other | Admitting: Pain Medicine

## 2021-07-03 VITALS — BP 177/86 | HR 63 | Temp 97.9°F | Resp 15 | Ht 69.0 in | Wt 138.0 lb

## 2021-07-03 DIAGNOSIS — G894 Chronic pain syndrome: Secondary | ICD-10-CM | POA: Diagnosis present

## 2021-07-03 DIAGNOSIS — C3431 Malignant neoplasm of lower lobe, right bronchus or lung: Secondary | ICD-10-CM

## 2021-07-03 DIAGNOSIS — G893 Neoplasm related pain (acute) (chronic): Secondary | ICD-10-CM

## 2021-07-03 DIAGNOSIS — Z79891 Long term (current) use of opiate analgesic: Secondary | ICD-10-CM | POA: Insufficient documentation

## 2021-07-03 DIAGNOSIS — G8929 Other chronic pain: Secondary | ICD-10-CM | POA: Insufficient documentation

## 2021-07-03 DIAGNOSIS — M48061 Spinal stenosis, lumbar region without neurogenic claudication: Secondary | ICD-10-CM | POA: Insufficient documentation

## 2021-07-03 DIAGNOSIS — M5126 Other intervertebral disc displacement, lumbar region: Secondary | ICD-10-CM

## 2021-07-03 DIAGNOSIS — M5441 Lumbago with sciatica, right side: Secondary | ICD-10-CM | POA: Diagnosis present

## 2021-07-03 DIAGNOSIS — M5416 Radiculopathy, lumbar region: Secondary | ICD-10-CM | POA: Insufficient documentation

## 2021-07-03 DIAGNOSIS — Z79899 Other long term (current) drug therapy: Secondary | ICD-10-CM | POA: Diagnosis present

## 2021-07-03 DIAGNOSIS — M961 Postlaminectomy syndrome, not elsewhere classified: Secondary | ICD-10-CM | POA: Diagnosis present

## 2021-07-03 DIAGNOSIS — Z5189 Encounter for other specified aftercare: Secondary | ICD-10-CM

## 2021-07-03 DIAGNOSIS — Z7189 Other specified counseling: Secondary | ICD-10-CM | POA: Insufficient documentation

## 2021-07-03 DIAGNOSIS — M47816 Spondylosis without myelopathy or radiculopathy, lumbar region: Secondary | ICD-10-CM | POA: Diagnosis present

## 2021-07-03 DIAGNOSIS — M5136 Other intervertebral disc degeneration, lumbar region: Secondary | ICD-10-CM | POA: Diagnosis present

## 2021-07-03 DIAGNOSIS — M79604 Pain in right leg: Secondary | ICD-10-CM | POA: Diagnosis present

## 2021-07-03 DIAGNOSIS — Z0289 Encounter for other administrative examinations: Secondary | ICD-10-CM | POA: Insufficient documentation

## 2021-07-03 MED ORDER — OXYCODONE HCL 5 MG PO TABS: 5.0000 mg | ORAL_TABLET | Freq: Four times a day (QID) | ORAL | 0 refills | Status: DC | PRN

## 2021-07-03 MED ORDER — LACTATED RINGERS IV SOLN
1000.0000 mL | Freq: Once | INTRAVENOUS | Status: AC
  Administered 2021-07-03: 1000 mL via INTRAVENOUS

## 2021-07-03 MED ORDER — OXYCODONE HCL ER 20 MG PO T12A: 20.0000 mg | EXTENDED_RELEASE_TABLET | Freq: Two times a day (BID) | ORAL | 0 refills | Status: DC

## 2021-07-03 MED ORDER — MIDAZOLAM HCL 5 MG/5ML IJ SOLN
0.5000 mg | Freq: Once | INTRAMUSCULAR | Status: AC
  Administered 2021-07-03: 2 mg via INTRAVENOUS
  Filled 2021-07-03: qty 5

## 2021-07-03 MED ORDER — LIDOCAINE HCL 2 % IJ SOLN
20.0000 mL | Freq: Once | INTRAMUSCULAR | Status: AC
  Administered 2021-07-03: 200 mg
  Filled 2021-07-03: qty 20

## 2021-07-03 MED ORDER — ROPIVACAINE HCL 2 MG/ML IJ SOLN
INTRAMUSCULAR | Status: AC
  Filled 2021-07-03: qty 20

## 2021-07-03 MED ORDER — OXYCODONE HCL ER 20 MG PO T12A
20.0000 mg | EXTENDED_RELEASE_TABLET | Freq: Two times a day (BID) | ORAL | 0 refills | Status: DC
Start: 2021-07-03 — End: 2021-08-02

## 2021-07-03 MED ORDER — TRIAMCINOLONE ACETONIDE 40 MG/ML IJ SUSP
40.0000 mg | Freq: Once | INTRAMUSCULAR | Status: AC
  Administered 2021-07-03: 40 mg
  Filled 2021-07-03: qty 1

## 2021-07-03 MED ORDER — IOHEXOL 180 MG/ML  SOLN
10.0000 mL | Freq: Once | INTRAMUSCULAR | Status: AC
  Administered 2021-07-03: 5 mL via EPIDURAL

## 2021-07-03 MED ORDER — SODIUM CHLORIDE (PF) 0.9 % IJ SOLN
INTRAMUSCULAR | Status: AC
  Filled 2021-07-03: qty 10

## 2021-07-03 MED ORDER — ROPIVACAINE HCL 2 MG/ML IJ SOLN
2.0000 mL | Freq: Once | INTRAMUSCULAR | Status: AC
  Administered 2021-07-03: 2 mL via EPIDURAL

## 2021-07-03 MED ORDER — LIDOCAINE HCL (PF) 2 % IJ SOLN
INTRAMUSCULAR | Status: AC
  Filled 2021-07-03: qty 10

## 2021-07-03 MED ORDER — SODIUM CHLORIDE 0.9% FLUSH
2.0000 mL | Freq: Once | INTRAVENOUS | Status: AC
  Administered 2021-07-03: 2 mL

## 2021-07-03 NOTE — Patient Instructions (Addendum)
____________________________________________________________________________________________  Post-Procedure Discharge Instructions  Instructions: Apply ice:  Purpose: This will minimize any swelling and discomfort after procedure.  When: Day of procedure, as soon as you get home. How: Fill a plastic sandwich bag with crushed ice. Cover it with a small towel and apply to injection site. How long: (15 min on, 15 min off) Apply for 15 minutes then remove x 15 minutes.  Repeat sequence on day of procedure, until you go to bed. Apply heat:  Purpose: To treat any soreness and discomfort from the procedure. When: Starting the next day after the procedure. How: Apply heat to procedure site starting the day following the procedure. How long: May continue to repeat daily, until discomfort goes away. Food intake: Start with clear liquids (like water) and advance to regular food, as tolerated.  Physical activities: Keep activities to a minimum for the first 8 hours after the procedure. After that, then as tolerated. Driving: If you have received any sedation, be responsible and do not drive. You are not allowed to drive for 24 hours after having sedation. Blood thinner: (Applies only to those taking blood thinners) You may restart your blood thinner 6 hours after your procedure. Insulin: (Applies only to Diabetic patients taking insulin) As soon as you can eat, you may resume your normal dosing schedule. Infection prevention: Keep procedure site clean and dry. Shower daily and clean area with soap and water. Post-procedure Pain Diary: Extremely important that this be done correctly and accurately. Recorded information will be used to determine the next step in treatment. For the purpose of accuracy, follow these rules: Evaluate only the area treated. Do not report or include pain from an untreated area. For the purpose of this evaluation, ignore all other areas of pain, except for the treated area. After  your procedure, avoid taking a long nap and attempting to complete the pain diary after you wake up. Instead, set your alarm clock to go off every hour, on the hour, for the initial 8 hours after the procedure. Document the duration of the numbing medicine, and the relief you are getting from it. Do not go to sleep and attempt to complete it later. It will not be accurate. If you received sedation, it is likely that you were given a medication that may cause amnesia. Because of this, completing the diary at a later time may cause the information to be inaccurate. This information is needed to plan your care. Follow-up appointment: Keep your post-procedure follow-up evaluation appointment after the procedure (usually 2 weeks for most procedures, 6 weeks for radiofrequencies). DO NOT FORGET to bring you pain diary with you.   Expect: (What should I expect to see with my procedure?) From numbing medicine (AKA: Local Anesthetics): Numbness or decrease in pain. You may also experience some weakness, which if present, could last for the duration of the local anesthetic. Onset: Full effect within 15 minutes of injected. Duration: It will depend on the type of local anesthetic used. On the average, 1 to 8 hours.  From steroids (Applies only if steroids were used): Decrease in swelling or inflammation. Once inflammation is improved, relief of the pain will follow. Onset of benefits: Depends on the amount of swelling present. The more swelling, the longer it will take for the benefits to be seen. In some cases, up to 10 days. Duration: Steroids will stay in the system x 2 weeks. Duration of benefits will depend on multiple posibilities including persistent irritating factors. Side-effects: If present, they  may typically last 2 weeks (the duration of the steroids). Frequent: Cramps (if they occur, drink Gatorade and take over-the-counter Magnesium 450-500 mg once to twice a day); water retention with temporary  weight gain; increases in blood sugar; decreased immune system response; increased appetite. Occasional: Facial flushing (red, warm cheeks); mood swings; menstrual changes. Uncommon: Long-term decrease or suppression of natural hormones; bone thinning. (These are more common with higher doses or more frequent use. This is why we prefer that our patients avoid having any injection therapies in other practices.)  Very Rare: Severe mood changes; psychosis; aseptic necrosis. From procedure: Some discomfort is to be expected once the numbing medicine wears off. This should be minimal if ice and heat are applied as instructed.  Call if: (When should I call?) You experience numbness and weakness that gets worse with time, as opposed to wearing off. New onset bowel or bladder incontinence. (Applies only to procedures done in the spine)  Emergency Numbers: Durning business hours (Monday - Thursday, 8:00 AM - 4:00 PM) (Friday, 9:00 AM - 12:00 Noon): [phone] After hours: [phone] NOTE: If you are having a problem and are unable connect with, or to talk to a provider, then go to your nearest urgent care or emergency department. If the problem is serious and urgent, please call 911. ____________________________________________________________________________________________ ____________________________________________________________________________________________  Medication Rules  Purpose: To inform patients, and their family members, of our rules and regulations.  Applies to: All patients receiving prescriptions (written or electronic).  Pharmacy of record: Pharmacy where electronic prescriptions will be sent. If written prescriptions are taken to a different pharmacy, please inform the nursing staff. The pharmacy listed in the electronic medical record should be the one where you would like electronic prescriptions to be sent.  Electronic prescriptions: In compliance with the [place] (STOP) Act of 2017 (Session Lanny Cramp [phone]), effective October 14, 2018, all controlled substances must be electronically prescribed. Calling prescriptions to the pharmacy will cease to exist.  Prescription refills: Only during scheduled appointments. Applies to all prescriptions.  NOTE: The following applies primarily to controlled substances (Opioid* Pain Medications).   Type of encounter (visit): For patients receiving controlled substances, face-to-face visits are required. (Not an option or up to the patient.)  Patient's responsibilities: Pain Pills: Bring all pain pills to every appointment (except for procedure appointments). Pill Bottles: Bring pills in original pharmacy bottle. Always bring the newest bottle. Bring bottle, even if empty. Medication refills: You are responsible for knowing and keeping track of what medications you take and those you need refilled. The day before your appointment: write a list of all prescriptions that need to be refilled. The day of the appointment: give the list to the admitting nurse. Prescriptions will be written only during appointments. No prescriptions will be written on procedure days. If you forget a medication: it will not be "Called in", "Faxed", or "electronically sent". You will need to get another appointment to get these prescribed. No early refills. Do not call asking to have your prescription filled early. Prescription Accuracy: You are responsible for carefully inspecting your prescriptions before leaving our office. Have the discharge nurse carefully go over each prescription with you, before taking them home. Make sure that your name is accurately spelled, that your address is correct. Check the name and dose of your medication to make sure it is accurate. Check the number of pills, and the written instructions to make sure they are clear and accurate. Make sure  that you are given enough  medication to last until your next medication refill appointment. Taking Medication: Take medication as prescribed. When it comes to controlled substances, taking less pills or less frequently than prescribed is permitted and encouraged. Never take more pills than instructed. Never take medication more frequently than prescribed.  Inform other Doctors: Always inform, all of your healthcare providers, of all the medications you take. Pain Medication from other Providers: You are not allowed to accept any additional pain medication from any other Doctor or Healthcare provider. There are two exceptions to this rule. (see below) In the event that you require additional pain medication, you are responsible for notifying us, as stated below. Cough Medicine: Often these contain an opioid, such as codeine or hydrocodone. Never accept or take cough medicine containing these opioids if you are already taking an opioid* medication. The combination may cause respiratory failure and death. Medication Agreement: You are responsible for carefully reading and following our Medication Agreement. This must be signed before receiving any prescriptions from our practice. Safely store a copy of your signed Agreement. Violations to the Agreement will result in no further prescriptions. (Additional copies of our Medication Agreement are available upon request.) Laws, Rules, & Regulations: All patients are expected to follow all Federal and [employer], Rules, [employer] [facility]. Ignorance of the Laws does not constitute a valid excuse.  Illegal drugs and Controlled Substances: The use of illegal substances (including, but not limited to marijuana and its derivatives) and/or the illegal use of any controlled substances is strictly prohibited. Violation of this rule may result in the immediate and permanent discontinuation of any and all prescriptions being written by our practice. The use of any illegal substances is  prohibited. Adopted CDC guidelines & recommendations: Target dosing levels will be at or below 60 MME/day. Use of benzodiazepines** is not recommended.  Exceptions: There are only two exceptions to the rule of not receiving pain medications from other Healthcare Providers. Exception #1 (Emergencies): In the event of an emergency (i.e.: accident requiring emergency care), you are allowed to receive additional pain medication. However, you are responsible for: As soon as you are able, call our office [phone], at any time of the day or night, and leave a message stating your name, the date and nature of the emergency, and the name and dose of the medication prescribed. In the event that your call is answered by a member of our staff, make sure to document and save the date, time, and the name of the person that took your information.  Exception #2 (Planned Surgery): In the event that you are scheduled by another doctor or dentist to have any type of surgery or procedure, you are allowed (for a period no longer than 30 days), to receive additional pain medication, for the acute post-op pain. However, in this case, you are responsible for picking up a copy of our "Post-op Pain Management for Surgeons" handout, and giving it to your surgeon or dentist. This document is available at our office, and does not require an appointment to obtain it. Simply go to our office during business hours (Monday-Thursday from 8:00 AM to 4:00 PM) (Friday 8:00 AM to 12:00 Noon) or if you have a scheduled appointment with [place], prior to your surgery, and ask for it by name. In addition, you are responsible for: calling our office [phone], at any time of the day or night, and leaving a message stating your name, name of your surgeon,  type of surgery, and date of procedure or surgery. Failure to comply with your responsibilities may result in termination of therapy involving the controlled substances.  *Opioid medications  include: morphine, codeine, oxycodone, oxymorphone, hydrocodone, hydromorphone, meperidine, tramadol, tapentadol, buprenorphine, fentanyl, methadone. **Benzodiazepine medications include: diazepam (Valium), alprazolam (Xanax), clonazepam (Klonopine), lorazepam (Ativan), clorazepate (Tranxene), chlordiazepoxide (Librium), estazolam (Prosom), oxazepam (Serax), temazepam (Restoril), triazolam (Halcion) (Last updated: 09/11/2020) ____________________________________________________________________________________________  ____________________________________________________________________________________________  Medication Recommendations and Reminders  Applies to: All patients receiving prescriptions (written and/or electronic).  Medication Rules & Regulations: These rules and regulations exist for your safety and that of others. They are not flexible and neither are we. Dismissing or ignoring them will be considered "non-compliance" with medication therapy, resulting in complete and irreversible termination of such therapy. (See document titled "Medication Rules" for more details.) In all conscience, because of safety reasons, we cannot continue providing a therapy where the patient does not follow instructions.  Pharmacy of record:  Definition: This is the pharmacy where your electronic prescriptions will be sent.  We do not endorse any particular pharmacy, however, we have experienced problems with [facility] not securing enough medication supply for the community. We do not restrict you in your choice of pharmacy. However, once we write for your prescriptions, we will NOT be re-sending more prescriptions to fix restricted supply problems created by your pharmacy, or your insurance.  The pharmacy listed in the electronic medical record should be the one where you want electronic prescriptions to be sent. If you choose to change pharmacy, simply notify our nursing staff.  Recommendations: Keep all  of your pain medications in a safe place, under lock and key, even if you live alone. We will NOT replace lost, stolen, or damaged medication. After you fill your prescription, take 1 week's worth of pills and put them away in a safe place. You should keep a separate, properly labeled bottle for this purpose. The remainder should be kept in the original bottle. Use this as your primary supply, until it runs out. Once it's gone, then you know that you have 1 week's worth of medicine, and it is time to come in for a prescription refill. If you do this correctly, it is unlikely that you will ever run out of medicine. To make sure that the above recommendation works, it is very important that you make sure your medication refill appointments are scheduled at least 1 week before you run out of medicine. To do this in an effective manner, make sure that you do not leave the office without scheduling your next medication management appointment. Always ask the nursing staff to show you in your prescription , when your medication will be running out. Then arrange for the receptionist to get you a return appointment, at least 7 days before you run out of medicine. Do not wait until you have 1 or 2 pills left, to come in. This is very poor planning and does not take into consideration that we may need to cancel appointments due to bad weather, sickness, or emergencies affecting our staff. DO NOT ACCEPT A "Partial Fill": If for any reason your pharmacy does not have enough pills/tablets to completely fill or refill your prescription, do not allow for a "partial fill". The law allows the pharmacy to complete that prescription within 72 hours, without requiring a new prescription. If they do not fill the rest of your prescription within those 72 hours, you will need a separate prescription to fill the remaining amount, which we  will NOT provide. If the reason for the partial fill is your insurance, you will need to talk to the  pharmacist about payment alternatives for the remaining tablets, but again, DO NOT ACCEPT A PARTIAL FILL, unless you can trust your pharmacist to obtain the remainder of the pills within 72 hours.  Prescription refills and/or changes in medication(s):  Prescription refills, and/or changes in dose or medication, will be conducted only during scheduled medication management appointments. (Applies to both, written and electronic prescriptions.) No refills on procedure days. No medication will be changed or started on procedure days. No changes, adjustments, and/or refills will be conducted on a procedure day. Doing so will interfere with the diagnostic portion of the procedure. No phone refills. No medications will be "called into the pharmacy". No Fax refills. No weekend refills. No Holliday refills. No after hours refills.  Remember:  Business hours are:  Monday to Thursday 8:00 AM to 4:00 PM Provider's Schedule: Milinda Pointer, MD - Appointments are:  Medication management: Monday and Wednesday 8:00 AM to 4:00 PM Procedure day: Tuesday and Thursday 7:30 AM to 4:00 PM Gillis Santa, MD - Appointments are:  Medication management: Tuesday and Thursday 8:00 AM to 4:00 PM Procedure day: Monday and Wednesday 7:30 AM to 4:00 PM (Last update: 05/03/2020) ____________________________________________________________________________________________  ____________________________________________________________________________________________  CBD (cannabidiol) WARNING  Applicable to: All individuals currently taking or considering taking CBD (cannabidiol) and, more important, all patients taking opioid analgesic controlled substances (pain medication). (Example: oxycodone; oxymorphone; hydrocodone; hydromorphone; morphine; methadone; tramadol; tapentadol; fentanyl; buprenorphine; butorphanol; dextromethorphan; meperidine; codeine; etc.)  Legal status: CBD remains a Schedule I drug prohibited for  any use. CBD is illegal with one exception. In the [place], CBD has a limited Transport planner (FDA) approval for the treatment of two specific types of epilepsy disorders. Only one CBD product has been approved by the FDA for this purpose: "Epidiolex". FDA is aware that some companies are marketing products containing cannabis and cannabis-derived compounds in ways that violate the [employer], Drug and Cosmetic Act [facility] Act) and that may put the health and safety of consumers at risk. The FDA, a Federal agency, has not enforced the CBD status since 2018.   Legality: Some manufacturers ship CBD products nationally, which is illegal. Often such products are sold online and are therefore available throughout the country. CBD is openly sold in head shops and health food stores in some states where such sales have not been explicitly legalized. Selling unapproved products with unsubstantiated therapeutic claims is not only a violation of the law, but also can put patients at risk, as these products have not been proven to be safe or effective. Federal illegality makes it difficult to conduct research on CBD.  Reference: "FDA Regulation of Cannabis and Cannabis-Derived Products, Including Cannabidiol (CBD)" - [employer]  Warning: CBD is not FDA approved and has not undergo the same manufacturing controls as prescription drugs.  This means that the purity and safety of available CBD may be questionable. Most of the time, despite manufacturer's claims, it is contaminated with THC (delta-9-tetrahydrocannabinol - the chemical in marijuana responsible for the "HIGH").  When this is the case, the [facility] contaminant will trigger a positive urine drug screen (UDS) test for Marijuana (carboxy-THC). Because a positive UDS for any illicit substance is a violation of our medication  agreement, your opioid analgesics (pain medicine) may be permanently discontinued.  MORE ABOUT CBD  General Information: CBD  is a derivative of the Marijuana (cannabis sativa)  plant discovered in 1940. It is one of the 113 identified substances found in Marijuana. It accounts for up to 40% of the plant's extract. As of 2018, preliminary clinical studies on CBD included research for the treatment of anxiety, movement disorders, and pain. CBD is available and consumed in multiple forms, including inhalation of smoke or vapor, as an aerosol spray, and by mouth. It may be supplied as an oil containing CBD, capsules, dried cannabis, or as a liquid solution. CBD is thought not to be as psychoactive as THC (delta-9-tetrahydrocannabinol - the chemical in marijuana responsible for the "HIGH"). Studies suggest that CBD may interact with different biological target receptors in the body, including cannabinoid and other neurotransmitter receptors. As of 2018 the mechanism of action for its biological effects has not been determined.  Side-effects  Adverse reactions: Dry mouth, diarrhea, decreased appetite, fatigue, drowsiness, malaise, weakness, sleep disturbances, and others.  Drug interactions: CBC may interact with other medications such as blood-thinners. (Last update: 05/20/2020) ____________________________________________________________________________________________  ____________________________________________________________________________________________  Drug Holidays (Slow)  What is a "Drug Holiday"? Drug Holiday: is the name given to the period of time during which a patient stops taking a medication(s) for the purpose of eliminating tolerance to the drug.  Benefits Improved effectiveness of opioids. Decreased opioid dose needed to achieve benefits. Improved pain with lesser dose.  What is tolerance? Tolerance: is the progressive decreased in effectiveness of a drug due to its  repetitive use. With repetitive use, the body gets use to the medication and as a consequence, it loses its effectiveness. This is a common problem seen with opioid pain medications. As a result, a larger dose of the drug is needed to achieve the same effect that used to be obtained with a smaller dose.  How long should a "Drug Holiday" last? You should stay off of the pain medicine for at least 14 consecutive days. (2 weeks)  Should I stop the medicine "cold [place]"? No. You should always coordinate with your Pain Specialist so that he/she can provide you with the correct medication dose to make the transition as smoothly as possible.  How do I stop the medicine? Slowly. You will be instructed to decrease the daily amount of pills that you take by one (1) pill every seven (7) days. This is called a "slow downward taper" of your dose. For example: if you normally take four (4) pills per day, you will be asked to drop this dose to three (3) pills per day for seven (7) days, then to two (2) pills per day for seven (7) days, then to one (1) per day for seven (7) days, and at the end of those last seven (7) days, this is when the "Drug Holiday" would start.   Will I have withdrawals? By doing a "slow downward taper" like this one, it is unlikely that you will experience any significant withdrawal symptoms. Typically, what triggers withdrawals is the sudden stop of a high dose opioid therapy. Withdrawals can usually be avoided by slowly decreasing the dose over a prolonged period of time. If you do not follow these instructions and decide to stop your medication abruptly, withdrawals may be possible.  What are withdrawals? Withdrawals: refers to the wide range of symptoms that occur after stopping or dramatically reducing opiate drugs after heavy and prolonged use. Withdrawal symptoms do not occur to patients that use low dose opioids, or those who take the medication sporadically. Contrary to  benzodiazepine (example: Valium, Xanax, etc.) or alcohol withdrawals ("  Delirium Tremens"), opioid withdrawals are not lethal. Withdrawals are the physical manifestation of the body getting rid of the excess receptors.  Expected Symptoms Early symptoms of withdrawal may include: Agitation Anxiety Muscle aches Increased tearing Insomnia Runny nose Sweating Yawning  Late symptoms of withdrawal may include: Abdominal cramping Diarrhea Dilated pupils Goose bumps Nausea Vomiting  Will I experience withdrawals? Due to the slow nature of the taper, it is very unlikely that you will experience any.  What is a slow taper? Taper: refers to the gradual decrease in dose.  (Last update: 05/03/2020) ____________________________________________________________________________________________

## 2021-07-03 NOTE — Progress Notes (Signed)
Nursing Pain Medication Assessment:  Safety precautions to be maintained throughout the outpatient stay will include: orient to surroundings, keep bed in low position, maintain call bell within reach at all times, provide assistance with transfer out of bed and ambulation.  Medication Inspection Compliance: Pill count conducted under aseptic conditions, in front of the patient. Neither the pills nor the bottle was removed from the patient's sight at any time. Once count was completed pills were immediately returned to the patient in their original bottle.  Medication #1: Oxycodone ER (OxyContin) Pill/Patch Count:  14 of 45 pills remain Pill/Patch Appearance: Markings consistent with prescribed medication Bottle Appearance: Standard pharmacy container. Clearly labeled. Filled Date: 07 / 04 / 2022 Last Medication intake:  Yesterday  Medication #2: Oxycodone IR Pill/Patch Count:  25 of 120 pills remain Pill/Patch Appearance: Markings consistent with prescribed medication Bottle Appearance: Standard pharmacy container. Clearly labeled. Filled Date: 09 / 13 /  2022 Last Medication intake:  Today  Called [facility], there are currently no unfilled prescriptions for Oxycodone ER or Oxycodone IR.

## 2021-07-04 ENCOUNTER — Telehealth: Payer: Self-pay

## 2021-07-04 NOTE — Telephone Encounter (Signed)
Post procedure phone call.  Patient states he is doing good.  

## 2021-07-18 NOTE — Progress Notes (Signed)
Patient: Rick Patrick Sr.  Service Category: E/M  Provider: Gaspar Cola, MD  DOB: 05/24/1951  DOS: 07/19/2021  Location: Office  MRN: [id number]  Setting: Ambulatory outpatient  Referring Provider: [facility], [place]  Type: Established Patient  Specialty: Interventional Pain Management  PCP: [facility], [facility]  Location: Remote location  Delivery: TeleHealth     Virtual Encounter - Pain Management PROVIDER NOTE: Information contained herein reflects review and annotations entered in association with encounter. Interpretation of such information and data should be left to medically-trained personnel. Information provided to patient can be located elsewhere in the medical record under "Patient Instructions". Document created using STT-dictation technology, any transcriptional errors that may result from process are unintentional.    Contact & Pharmacy Preferred: [phone] Home: [phone] (home) Mobile: [phone] (mobile) E-mail: No e-mail address on record  [id number] [place] [id number]: [phone]: [phone]  Pre-screening  Rick Carter "in-person" vs "virtual" encounter. He indicated preferring virtual for this encounter.   Reason COVID-19*  Social distancing based on CDC and AMA recommendations.   I contacted Rick PatrickSr. on 07/19/2021 via telephone.      I clearly identified myself as FGaspar Cola MD. I verified that I was speaking with the correct person using two identifiers (Name: JDAXTER PAULESr., and date of birth: [birth year].  Consent I sought verbal advanced consent from Rick Carter virtual visit interactions. I informed Mr. CLevarioof possible security and privacy concerns, risks, and limitations associated with providing "not-in-person" medical evaluation and management services. I also informed Mr. CCampiof the availability of "in-person"  appointments. Finally, I informed him that there would be a charge for the virtual visit and that he could be  personally, fully or partially, financially responsible for it. Mr. CJandreauexpressed understanding and agreed to proceed.   Historic Elements   [name] is a 70y.o. year old, male patient evaluated today after our last contact on 07/03/2021. Rick Carter has a past medical history of Anxiety, Bronchitis, Cancer ([place], COPD (chronic obstructive pulmonary disease) ([place], Depression, Hernia of abdominal wall, History of [place]ism ([place] (08[place]08[place]2015), History of pneumonectomy (08/08/2015), Hypertension, Macular degeneration (08/26/2016), and Tumor cells. He also  has a past surgical history that includes Tonsillectomy; Lung removal, partial; and Neck surgery. Rick Carter a current medication list which includes the following prescription(s): acetaminophen, acetylcysteine, tudorza pressair, advair diskus, albuterol, aspirin ec, bacitracin, buspirone, cholecalciferol, cholecalciferol, fluoxetine, fluticasone-salmeterol, trelegy ellipta, ipratropium-albuterol, lidocaine, lorazepam, losartan-hydrochlorothiazide, melatonin, oxycodone, [START ON 08/02/2021] oxycodone, [START ON 09/01/2021] oxycodone, oxycodone, [START ON 08/02/2021] oxycodone, [START ON 09/01/2021] oxycodone, polyethylene glycol, senna, ventolin hfa, albuterol, fluticasone-salmeterol, pregabalin, and tizanidine. He  reports that he quit smoking about 2 years ago. His smoking use included cigarettes. He has never used smokeless tobacco. He reports that he does not drink alcohol. No history on file for drug use. [name] allergic to atorvastatin and varenicline.   HPI  Today, he is being contacted for a post-procedure assessment.  As usual, Mr. CBonsignoreindicates having attained excellent relief of his pain with the palliative right L4-5 LESI.  He refers currently enjoying an ongoing 80 to 90% relief of his low back and lower  extremity pain.  He indicates currently not needing anything else [place]  We will follow-up with him on his next medication refill.  Post-Procedure Evaluation  Procedure (07/03/2021):  Procedure:           Anesthesia, Analgesia, Anxiolysis:  Type: Therapeutic Inter-Laminar Epidural Steroid Injection           Region: Lumbar Level: L4-5 Level. Laterality: Right          Type: Local Anesthesia Local Anesthetic: Lidocaine 1-2% Sedation: Minimal Anxiolysis  Indication(s): Anxiety & Analgesia Route: Infiltration ([place]/IM) IV Access: Available     Position: Prone with head of the table was raised to facilitate breathing.    Indications: 1. DDD (degenerative disc disease), lumbar   2. Chronic lumbar radicular pain (L5/S1 dermatome) (Right)   3. 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 Lumbar facet arthropathy (Bilateral) (R>L)   5. Lumbar foraminal stenosis (L5-S1) (Right)   6. Lumbar spondylosis   7. Failed back surgical syndrome   8. Chronic low back pain (1ry area of Pain) (midline)   9. Chronic lower extremity pain (2ry area of Pain) (Right)     Pain Score: Pre-procedure: 7 /10 Post-procedure: 4 /10   Anxiolysis: Please see nurses note.  Effectiveness during initial hour after procedure (Ultra-Short Term Relief): 80 %.  Local anesthetic used: Long-acting (4-6 hours) Effectiveness: Defined as any analgesic benefit obtained secondary to the administration of local anesthetics. This carries significant diagnostic value as to the etiological location, or anatomical origin, of the pain. Duration of benefit is expected to coincide with the duration of the local anesthetic used.  Effectiveness during initial 4-6 hours after procedure (Short-Term Relief): 80 %.  Long-term benefit: Defined as any relief past the pharmacologic duration of the local anesthetics.  Effectiveness past the initial 6 hours after procedure (Long-Term Relief): 80 %.  Benefits, current: Defined as benefit  present at the time of this evaluation.   Analgesia: The patient indicates currently enjoying an ongoing 80 to 90% relief of his low back and lower extremity pain. Function: Rick Carter reports improvement in function ROM: Rick Carter reports improvement in ROM  Pharmacotherapy Assessment   Analgesic: Oxycodone ER 20 mg, 1 tab PO BID (40 mg/day of oxycodone) + Oxycodone IR 5 mg, 1 tab PO q 6 hrs PRN for breakthrough pain (20 mg/day of oxycodone) (total: 60 mg/day of oxycodone) MME/day: 90 MME/day.   Monitoring: [place] PMP: PDMP reviewed during this encounter.       Pharmacotherapy: No side-effects or adverse reactions reported. Compliance: No problems identified. Effectiveness: Clinically acceptable. Plan: Refer to "POC". UDS:  Summary  Date Value Ref Range Status  05/29/2021 Note  Final    Comment:    ==================================================================== [employer] 13 (MW) ==================================================================== Test                             Result       Flag       Units  Drug Present and Declared for Prescription Verification   Lorazepam                      1823         EXPECTED   ng/mg creat    Source of lorazepam is a scheduled prescription medication.    Oxycodone                      2503         EXPECTED   ng/mg creat   Oxymorphone  639          EXPECTED   ng/mg creat   Noroxycodone                   >14085       EXPECTED   ng/mg creat   Noroxymorphone                 642          EXPECTED   ng/mg creat    Sources of oxycodone are scheduled prescription medications.    Oxymorphone, noroxycodone, and noroxymorphone are expected    metabolites of oxycodone. Oxymorphone is also available as a    scheduled prescription medication.  ==================================================================== Test                      Result    Flag   Units      Ref Range   Creatinine              71                mg/dL      >=20 ==================================================================== Declared Medications:  The flagging and interpretation on this report are based on the  following declared medications.  Unexpected results may arise from  inaccuracies in the declared medications.   **Note: The testing scope of this panel includes these medications:   Lorazepam (Ativan)  Oxycodone   **Note: The testing scope of this panel does not include the  following reported medications:   Acetaminophen (Tylenol)  Acetylcysteine  Aclidinium  Albuterol  Albuterol (Duoneb)  Aspirin  Escitalopram (Lexapro)  Fluoxetine (Prozac)  Fluticasone (Advair)  Fluticasone (Trelegy)  Furosemide (Lasix)  Guaifenesin (Mucinex)  Hydrochlorothiazide (Microzide)  Hydrochlorothiazide (Hyzaar)  Hydroxyzine (Atarax)  Ipratropium (Duoneb)  Loratadine (Claritin)  Losartan (Hyzaar)  Lubiprostone (Amitiza)  Multivitamin  Pantoprazole (Protonix)  Pregabalin (Lyrica)  Salmeterol (Advair)  Tizanidine (Zanaflex)  Trazodone (Desyrel)  Umeclidinium (Trelegy)  Umeclidinium  Vilanterol (Trelegy)  Vitamin D ==================================================================== For clinical consultation, please call [phone]. ====================================================================      Laboratory Chemistry Profile   Renal Lab Results  Component Value Date   BUN 9 04/06/2018   CREATININE 0.84 04/06/2018   BCR 11 04/06/2018   GFRAA 104 04/06/2018   GFRNONAA 90 04/06/2018    Hepatic Lab Results  Component Value Date   AST 18 04/06/2018   ALT 24 01/06/2017   ALBUMIN 4.6 04/06/2018   ALKPHOS 64 04/06/2018    Electrolytes Lab Results  Component Value Date   NA 144 04/06/2018   K 4.2 04/06/2018   CL 102 04/06/2018   CALCIUM 9.8 04/06/2018   MG 2.1 04/06/2018    Bone Lab Results  Component Value Date   25OHVITD1 76 04/06/2018   25OHVITD2 <1.0 04/06/2018   25OHVITD3 76  04/06/2018    Inflammation (CRP: Acute Phase) (ESR: Chronic Phase) Lab Results  Component Value Date   CRP 2 04/06/2018   ESRSEDRATE 4 04/06/2018         Note: Above Lab results reviewed.  Imaging  DG PAIN CLINIC C-ARM 1-60 MIN NO REPORT Fluoro was used, but no Radiologist interpretation will be provided.  Please refer to "NOTES" tab for provider progress note.  Assessment  The primary encounter diagnosis was Chronic lumbar radicular pain (L5/S1 dermatome) (Right). Diagnoses of Chronic low back pain (1ry area of Pain) (midline) and Failed back surgical syndrome were also pertinent to this visit.  Plan of Care  Problem-specific:  No problem-specific Assessment & Plan notes found for this encounter.  Mr. SALAM MICUCCI Sr. has a current medication list which includes the following long-term medication(s): tudorza pressair, advair diskus, albuterol, fluticasone-salmeterol, ipratropium-albuterol, losartan-hydrochlorothiazide, ventolin hfa, albuterol, fluticasone-salmeterol, pregabalin, and tizanidine.  Pharmacotherapy (Medications Ordered): No orders of the defined types were placed in this encounter.  Orders:  No orders of the defined types were placed in this encounter.  Follow-up plan:   Return for scheduled encounter.     Interventional management options:  Considering:   Diagnostic right L3 TFESI  Diagnostic right L5 TFESI    Palliative PRN treatment(s):   Palliative bilateral lumbar facet block #2  Palliative left cervical ESI #7  Palliative right T8-9 thoracic ESI #4  Palliative ML T6-7 thoracic ESI #3  Palliative right L5-S1 LESI #2  Palliative right L4-5 LESI #5  Palliative left hand, ring finger (No.:4)(A-4 pulley), trigger finger injection #4  Palliative ML L2-3 interspinous ligament injection #2          Recent Visits Date Type Provider Dept  07/03/21 Procedure visit Milinda Pointer, MD [facility]  06/13/21 Office Visit Milinda Pointer,  MD [facility]  05/29/21 Office Visit Milinda Pointer, MD [facility]  Showing recent visits within past 90 days and meeting all other requirements Today's Visits Date Type Provider Dept  07/19/21 Office Visit Milinda Pointer, MD [facility]  Showing today's visits and meeting all other requirements Future Appointments Date Type Provider Dept  09/26/21 Appointment Milinda Pointer, MD [facility]  Showing future appointments within next 90 days and meeting all other requirements I discussed the assessment and treatment plan with the patient. The patient was provided an opportunity to ask questions and all were answered. The patient agreed with the plan and demonstrated an understanding of the instructions.  Patient advised to call back or seek an in-person evaluation if the symptoms or condition worsens.  Duration of encounter: 12 minutes.  Note by: Gaspar Cola, MD Date: 07/19/2021; Time: 12:39 PM

## 2021-07-19 ENCOUNTER — Ambulatory Visit: Payer: Medicare Other | Attending: Pain Medicine | Admitting: Pain Medicine

## 2021-07-19 ENCOUNTER — Other Ambulatory Visit: Payer: Self-pay

## 2021-07-19 DIAGNOSIS — G8929 Other chronic pain: Secondary | ICD-10-CM

## 2021-07-19 DIAGNOSIS — M5441 Lumbago with sciatica, right side: Secondary | ICD-10-CM | POA: Diagnosis not present

## 2021-07-19 DIAGNOSIS — M5416 Radiculopathy, lumbar region: Secondary | ICD-10-CM | POA: Diagnosis not present

## 2021-07-19 DIAGNOSIS — M961 Postlaminectomy syndrome, not elsewhere classified: Secondary | ICD-10-CM | POA: Diagnosis not present

## 2021-09-03 ENCOUNTER — Encounter: Payer: Medicare Other | Admitting: Pain Medicine

## 2021-09-26 ENCOUNTER — Ambulatory Visit: Payer: Medicare Other | Attending: Pain Medicine | Admitting: Pain Medicine

## 2021-09-26 ENCOUNTER — Encounter: Payer: Medicare Other | Admitting: Pain Medicine

## 2021-09-26 ENCOUNTER — Other Ambulatory Visit: Payer: Self-pay

## 2021-09-26 DIAGNOSIS — G893 Neoplasm related pain (acute) (chronic): Secondary | ICD-10-CM

## 2021-09-26 DIAGNOSIS — M961 Postlaminectomy syndrome, not elsewhere classified: Secondary | ICD-10-CM

## 2021-09-26 DIAGNOSIS — Z79891 Long term (current) use of opiate analgesic: Secondary | ICD-10-CM

## 2021-09-26 DIAGNOSIS — C3431 Malignant neoplasm of lower lobe, right bronchus or lung: Secondary | ICD-10-CM

## 2021-09-26 DIAGNOSIS — Z79899 Other long term (current) drug therapy: Secondary | ICD-10-CM

## 2021-09-26 DIAGNOSIS — G894 Chronic pain syndrome: Secondary | ICD-10-CM

## 2021-09-26 MED ORDER — OXYCODONE HCL ER 20 MG PO T12A: 20.0000 mg | EXTENDED_RELEASE_TABLET | Freq: Two times a day (BID) | ORAL | 0 refills | Status: DC

## 2021-09-26 MED ORDER — OXYCODONE HCL 5 MG PO TABS: 5.0000 mg | ORAL_TABLET | Freq: Four times a day (QID) | ORAL | 0 refills | Status: DC | PRN

## 2021-09-26 NOTE — Progress Notes (Deleted)
The patient changed his appointment to a virtual visit.

## 2021-09-26 NOTE — Progress Notes (Signed)
Visit switched from face-to-face to virtual.

## 2021-09-26 NOTE — Progress Notes (Signed)
Patient: Rick Patrick Sr.  Service Category: E/M  Provider: Gaspar Cola, MD  DOB: 1951-05-29  DOS: 09/26/2021  Location: Office  MRN: [id number]  Setting: Ambulatory outpatient  Referring Provider: [facility] [place]  Type: Established Patient  Specialty: Interventional Pain Management  PCP: [facility] [place]  Location: Remote location  Delivery: TeleHealth     Virtual Encounter - Pain Management PROVIDER NOTE: Information contained herein reflects review and annotations entered in association with encounter. Interpretation of such information and data should be left to medically-trained personnel. Information provided to patient can be located elsewhere in the medical record under "Patient Instructions". Document created using STT-dictation technology, any transcriptional errors that may result from process are unintentional.    Contact & Pharmacy Preferred: [phone] Home: [phone] (home) Mobile: [phone] (mobile) E-mail: No e-mail address on record  [id number] [place] [id number]: [phone]: [phone]  Pre-screening  [name] "in-person" vs "virtual" encounter. He indicated preferring virtual for this encounter.   Reason COVID-19*   Social distancing based on CDC and AMA recommendations.   I contacted [name] on 09/26/2021 via telephone.      I clearly identified myself as FGaspar Cola MD. I verified that I was speaking with the correct person using two identifiers (Name: Rick BOURKESr., and date of birth: 1Oct 25, 1952.  Consent I sought verbal advanced consent from [place] virtual visit interactions. I informed [name] possible security and privacy concerns, risks, and limitations associated with providing "not-in-person" medical evaluation and management services. I also informed [name] the availability of "in-person"  appointments. Finally, I informed him that there would be a charge for the virtual visit and that he could be  personally, fully or partially, financially responsible for it. Mr. CBonifaceexpressed understanding and agreed to proceed.   Historic Elements   [name] is a 70y.o. year old, male patient evaluated today after our last contact on 07/19/2021. Rick Carter has a past medical history of Anxiety, Bronchitis, Cancer ([place], COPD (chronic obstructive pulmonary disease) ([place], Depression, Hernia of abdominal wall, History of alcoholism ([place] (08/08/2015), History of pneumonectomy (08/08/2015), Hypertension, Macular degeneration (08/26/2016), and Tumor cells. He also  has a past surgical history that includes Tonsillectomy; Lung removal, partial; and Neck surgery. Mr. CVirdenhas a current medication list which includes the following prescription(s): acetaminophen, acetylcysteine, tudorza pressair, advair diskus, albuterol, albuterol, aspirin ec, bacitracin, buspirone, cholecalciferol, cholecalciferol, fluoxetine, fluticasone-salmeterol, fluticasone-salmeterol, trelegy ellipta, ipratropium-albuterol, lidocaine, lorazepam, losartan-hydrochlorothiazide, melatonin, polyethylene glycol, pregabalin, senna, tizanidine, ventolin hfa, [START ON 10/01/2021] oxycodone, and [START ON 10/01/2021] oxycodone. He  reports that he quit smoking about 2 years ago. His smoking use included cigarettes. He has never used smokeless tobacco. He reports that he does not drink alcohol. No history on file for drug use. Rick Carter allergic to atorvastatin and varenicline.   HPI  Today, he is being contacted for medication management.  The patient was scheduled for a face-to-face medication management encounter.  He called indicating that he is having a lot of difficulty breathing and he will not be able to make it however he wanted to switch it to a virtual visit in order to get his medications.  RTCB:  10/31/2021  Pharmacotherapy Assessment   Opioid Analgesic: Oxycodone ER 20 mg, 1 tab PO BID (40 mg/day of oxycodone) +  Oxycodone IR 5 mg, 1 tab PO q 6 hrs PRN for breakthrough pain (20 mg/day of oxycodone) (total: 60 mg/day of oxycodone) MME/day: 90 MME/day.   Monitoring: [place] PMP: PDMP reviewed during this encounter.       Pharmacotherapy: No side-effects or adverse reactions reported. Compliance: No problems identified. Effectiveness: Clinically acceptable. Plan: Refer to "POC". UDS:  Summary  Date Value Ref Range Status  05/29/2021 Note  Final    Comment:    ==================================================================== [employer] [facility] 13 (MW) ==================================================================== Test                             Result       Flag       Units  Drug Present and Declared for Prescription Verification   Lorazepam                      1823         EXPECTED   ng/mg creat    Source of lorazepam is a scheduled prescription medication.    Oxycodone                      2503         EXPECTED   ng/mg creat   Oxymorphone                    639          EXPECTED   ng/mg creat   Noroxycodone                   >14085       EXPECTED   ng/mg creat   Noroxymorphone                 642          EXPECTED   ng/mg creat    Sources of oxycodone are scheduled prescription medications.    Oxymorphone, noroxycodone, and noroxymorphone are expected    metabolites of oxycodone. Oxymorphone is also available as a    scheduled prescription medication.  ==================================================================== Test                      Result    Flag   Units      Ref Range   Creatinine              71               mg/dL      >=20 ==================================================================== Declared Medications:  The flagging and interpretation on this report are based on the  following declared medications.  Unexpected results may arise from   inaccuracies in the declared medications.   **Note: The testing scope of this panel includes these medications:   Lorazepam (Ativan)  Oxycodone   **Note: The testing scope of this panel does not include the  following reported medications:   Acetaminophen (Tylenol)  Acetylcysteine  Aclidinium  Albuterol  Albuterol (Duoneb)  Aspirin  Escitalopram (Lexapro)  Fluoxetine (Prozac)  Fluticasone (Advair)  Fluticasone (Trelegy)  Furosemide (Lasix)  Guaifenesin (Mucinex)  Hydrochlorothiazide (Microzide)  Hydrochlorothiazide (Hyzaar)  Hydroxyzine (Atarax)  Ipratropium (Duoneb)  Loratadine (Claritin)  Losartan (Hyzaar)  Lubiprostone (Amitiza)  Multivitamin  Pantoprazole (Protonix)  Pregabalin (Lyrica)  Salmeterol (Advair)  Tizanidine (Zanaflex)  Trazodone (Desyrel)  Umeclidinium (Trelegy)  Umeclidinium  Vilanterol (Trelegy)  Vitamin D ==================================================================== For clinical consultation, please call (  [phone]. ====================================================================      Laboratory Chemistry Profile   Renal Lab Results  Component Value Date   BUN 9 04/06/2018   CREATININE 0.84 04/06/2018   BCR 11 04/06/2018   GFRAA 104 04/06/2018   GFRNONAA 90 04/06/2018    Hepatic Lab Results  Component Value Date   AST 18 04/06/2018   ALT 24 01/06/2017   ALBUMIN 4.6 04/06/2018   ALKPHOS 64 04/06/2018    Electrolytes Lab Results  Component Value Date   NA 144 04/06/2018   K 4.2 04/06/2018   CL 102 04/06/2018   CALCIUM 9.8 04/06/2018   MG 2.1 04/06/2018    Bone Lab Results  Component Value Date   25OHVITD1 76 04/06/2018   25OHVITD2 <1.0 04/06/2018   25OHVITD3 76 04/06/2018    Inflammation (CRP: Acute Phase) (ESR: Chronic Phase) Lab Results  Component Value Date   CRP 2 04/06/2018   ESRSEDRATE 4 04/06/2018         Note: Above Lab results reviewed.  Imaging  DG PAIN CLINIC Carter-ARM 1-60 MIN NO  REPORT Fluoro was used, but no Radiologist interpretation will be provided.  Please refer to "NOTES" tab for provider progress note.  Assessment  Diagnoses of Failed back surgical syndrome, Pharmacologic therapy, Chronic use of opiate for therapeutic purpose, Encounter for medication management, Chronic pain syndrome, Cancer-related pain, and Malignant neoplasm of lower lobe of right lung ([place]) were pertinent to this visit.  Plan of Care  Problem-specific:  No problem-specific Assessment & Plan notes found for this encounter.  Mr. ADIAN JABLONOWSKI Sr. has a current medication list which includes the following long-term medication(s): tudorza pressair, advair diskus, albuterol, albuterol, fluticasone-salmeterol, fluticasone-salmeterol, ipratropium-albuterol, losartan-hydrochlorothiazide, pregabalin, tizanidine, ventolin hfa, [START ON 10/01/2021] oxycodone, and [START ON 10/01/2021] oxycodone.  Pharmacotherapy (Medications Ordered): Meds ordered this encounter  Medications   oxyCODONE (OXYCONTIN) 20 mg 12 hr tablet    Sig: Take 1 tablet (20 mg total) by mouth every 12 (twelve) hours. Months last 30 days.    Dispense:  60 tablet    Refill:  0    DO NOT: delete (not duplicate); no partial-fill (will deny script to complete), no refill request (F/U required). DISPENSE: 1 day early if closed on fill date. WARN: No CNS-depressants within 8 hrs of med.   oxyCODONE (OXY IR/ROXICODONE) 5 MG immediate release tablet    Sig: Take 1 tablet (5 mg total) by mouth every 6 (six) hours as needed for severe pain or breakthrough pain. Must last 30 days. Max: 4/day    Dispense:  120 tablet    Refill:  0    DO NOT: delete (not duplicate); no partial-fill (will deny script to complete), no refill request (F/U required). DISPENSE: 1 day early if closed on fill date. WARN: No CNS-depressants within 8 hrs of med.   Orders:  No orders of the defined types were placed in this encounter.  Follow-up plan:   Return  in about 5 weeks (around 10/31/2021) for Eval-day (M,W), (F2F), (MM).     Interventional management options:  Considering:   Diagnostic right L3 TFESI  Diagnostic right L5 TFESI    Palliative PRN treatment(s):   Palliative bilateral lumbar facet block #2  Palliative left cervical ESI #7  Palliative right T8-9 thoracic ESI #4  Palliative ML T6-7 thoracic ESI #3  Palliative right L5-S1 LESI #2  Palliative right L4-5 LESI #5  Palliative left hand, ring finger (No.:4)(A-4 pulley), trigger finger injection #4  Palliative ML L2-3 interspinous  ligament injection #2     Recent Visits Date Type Provider Dept  07/19/21 Office Visit Milinda Pointer, MD [facility]  07/03/21 Procedure visit Milinda Pointer, MD [facility]  Showing recent visits within past 90 days and meeting all other requirements Today's Visits Date Type Provider Dept  09/26/21 Office Visit Milinda Pointer, MD [facility]  Showing today's visits and meeting all other requirements Future Appointments No visits were found meeting these conditions. Showing future appointments within next 90 days and meeting all other requirements I discussed the assessment and treatment plan with the patient. The patient was provided an opportunity to ask questions and all were answered. The patient agreed with the plan and demonstrated an understanding of the instructions.  Patient advised to call back or seek an in-person evaluation if the symptoms or condition worsens.  Duration of encounter: 12 minutes.  Note by: Gaspar Cola, MD Date: 09/26/2021; Time: 12:53 PM

## 2021-10-04 ENCOUNTER — Other Ambulatory Visit: Payer: Self-pay | Admitting: Pain Medicine

## 2021-10-04 DIAGNOSIS — M792 Neuralgia and neuritis, unspecified: Secondary | ICD-10-CM

## 2021-10-14 DIAGNOSIS — F1011 Alcohol abuse, in remission: Secondary | ICD-10-CM | POA: Insufficient documentation

## 2021-10-23 NOTE — Progress Notes (Deleted)
(  10/24/2021) The patient was on his way to his appointment when he developed respiratory problems.  His driver deviated and took him to the emergency room for evaluation and treatment.

## 2021-10-24 ENCOUNTER — Encounter: Payer: Medicare Other | Admitting: Pain Medicine

## 2021-10-24 ENCOUNTER — Other Ambulatory Visit: Payer: Self-pay | Admitting: Pain Medicine

## 2021-10-24 DIAGNOSIS — G893 Neoplasm related pain (acute) (chronic): Secondary | ICD-10-CM

## 2021-10-24 DIAGNOSIS — M961 Postlaminectomy syndrome, not elsewhere classified: Secondary | ICD-10-CM

## 2021-10-24 DIAGNOSIS — Z79891 Long term (current) use of opiate analgesic: Secondary | ICD-10-CM

## 2021-10-24 DIAGNOSIS — M47816 Spondylosis without myelopathy or radiculopathy, lumbar region: Secondary | ICD-10-CM

## 2021-10-24 DIAGNOSIS — C3431 Malignant neoplasm of lower lobe, right bronchus or lung: Secondary | ICD-10-CM

## 2021-10-24 DIAGNOSIS — G8929 Other chronic pain: Secondary | ICD-10-CM

## 2021-10-24 DIAGNOSIS — G894 Chronic pain syndrome: Secondary | ICD-10-CM

## 2021-10-24 DIAGNOSIS — Z9889 Other specified postprocedural states: Secondary | ICD-10-CM

## 2021-10-24 DIAGNOSIS — Z79899 Other long term (current) drug therapy: Secondary | ICD-10-CM

## 2021-10-24 NOTE — Progress Notes (Signed)
(  10/24/2021) The patient was on his way to his appointment when he developed respiratory problems.  His driver deviated and took him to the emergency room for evaluation and treatment.  He should have enough medication to last until 10/31/2021.

## 2021-10-27 DIAGNOSIS — Z515 Encounter for palliative care: Secondary | ICD-10-CM | POA: Insufficient documentation

## 2021-11-05 ENCOUNTER — Telehealth: Payer: Self-pay | Admitting: Pain Medicine

## 2021-11-05 NOTE — Telephone Encounter (Signed)
Darric's sister Bonnita Nasuti called to give update on him. He just got home from the hospital on Sunday. They are setting up Hospice care. He is not doing very well. Weighs less that 117lbs Doesn't recognize her most of the time and sleeps a lot. She will keep [place] updated. I told her we would keep them in our thoughts and prayers. Please let Dr. Dossie Arbour know as well.

## 2021-11-05 NOTE — Telephone Encounter (Signed)
Thank you for letting us know.  

## 2021-11-26 DIAGNOSIS — I2723 Pulmonary hypertension due to lung diseases and hypoxia: Secondary | ICD-10-CM | POA: Insufficient documentation

## 2021-11-27 ENCOUNTER — Other Ambulatory Visit (HOSPITAL_BASED_OUTPATIENT_CLINIC_OR_DEPARTMENT_OTHER): Payer: Medicare Other | Admitting: Pain Medicine

## 2021-11-27 ENCOUNTER — Encounter: Payer: Medicare Other | Admitting: Pain Medicine

## 2021-11-27 DIAGNOSIS — G894 Chronic pain syndrome: Secondary | ICD-10-CM

## 2021-11-27 DIAGNOSIS — M546 Pain in thoracic spine: Secondary | ICD-10-CM

## 2021-11-27 DIAGNOSIS — G8912 Acute post-thoracotomy pain: Secondary | ICD-10-CM

## 2021-11-27 DIAGNOSIS — C3431 Malignant neoplasm of lower lobe, right bronchus or lung: Secondary | ICD-10-CM

## 2021-11-27 DIAGNOSIS — R64 Cachexia: Secondary | ICD-10-CM

## 2021-11-27 DIAGNOSIS — F411 Generalized anxiety disorder: Secondary | ICD-10-CM

## 2021-11-27 DIAGNOSIS — G893 Neoplasm related pain (acute) (chronic): Secondary | ICD-10-CM

## 2021-11-27 DIAGNOSIS — M961 Postlaminectomy syndrome, not elsewhere classified: Secondary | ICD-10-CM

## 2021-11-27 NOTE — Progress Notes (Deleted)
Unable to keep appointment secondary to diarrhea.

## 2021-11-27 NOTE — Progress Notes (Signed)
(  11/27/2021) again, the patient was unable to keep his clinic appointment today.  Review of [place] PMP reveals that he has attained multiple opioid prescriptions from several other physicians.  Although I understand his situation, this still constitutes a violation of our medication agreement.  Rick Carter is an unfortunate 71 year old male that has entered a face palliative care secondary to a malignancy of the lungs probably associated with many years of smoking.  I took care of his chronic neck pain, thoracic pain, and lumbar pain for many years.  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  He was found to have lung cancer that did not seem to respond to treatment and was deemed to be terminal.  He was admitted to palliative care but surprisingly he survived the apparent time-limited allowed and he was again released to home care.  He has multiple other medical issues for which he has taken not only opioids, but benzodiazepines to control anxiety and panic attacks.  While under my care, he was taking OxyContin ER 10 mg tablets 3 times daily + oxycodone IR 5 mg 4 times daily for breakthrough pain (total: 15 mg/day of oxycodone; 75 MME).  In addition he was taking pregabalin 150 mg capsule 3 times daily (450 mg/day).  For multiple medical reasons, he has not been able to make his appointments and his medications have been taken over by other practitioners that have made different adjustments on these medications.  Although I agree that his condition would warrant the use of these medications and that progression of his disease may also require adjustments, I believe it to be a really bad idea to have multiple physicians do this.  For this reason I have informed the patient that I will be stepping back and away from further continuing to prescribe these opioids  since he needs somebody closer to where he lives and he also needs only 1 person managing those medicines.  If Rick Carter believes that he needs any interventional treatments, we will evaluate him for those, if necessary.

## 2021-11-29 ENCOUNTER — Encounter: Payer: Medicare Other | Admitting: Pain Medicine

## 2021-12-13 ENCOUNTER — Encounter: Payer: Self-pay | Admitting: Pain Medicine

## 2021-12-15 NOTE — Progress Notes (Signed)
Patient: Rick Patrick Sr.  Service Category: E/M  Provider: Gaspar Cola, MD  DOB: Oct 13, 1951  DOS: 12/17/2021  Location: Office  MRN: [id number]  Setting: Ambulatory outpatient  Referring Provider: Rupert Stacks, MD  Type: Established Patient  Specialty: Interventional Pain Management  PCP: Rupert Stacks, MD  Location: Remote location  Delivery: TeleHealth     Virtual Encounter - Pain Management PROVIDER NOTE: Information contained herein reflects review and annotations entered in association with encounter. Interpretation of such information and data should be left to medically-trained personnel. Information provided to patient can be located elsewhere in the medical record under "Patient Instructions". Document created using STT-dictation technology, any transcriptional errors that may result from process are unintentional.    Contact & Pharmacy Preferred: [phone] Home: [phone] (home) Mobile: [phone] (mobile) E-mail: No e-mail address on record  [id number] [place] [address]Phone: [phone]: [phone]  Pre-screening  [name] "in-person" vs "virtual" encounter. He indicated preferring virtual for this encounter.   Reason COVID-19*   Social distancing based on CDC and AMA recommendations.   I contacted Rick PatrickSr. on 12/17/2021 via telephone.      I clearly identified myself as FGaspar Cola MD. I verified that I was speaking with the correct person using two identifiers (Name: Rick MEHRASr., and date of birth: 107-05-1951.  Consent I sought verbal advanced consent from [place]for [place] visit interactions. I informed [name] possible security and privacy concerns, risks, and limitations associated with providing "not-in-person" medical evaluation and management services. I also informed [name] the availability of "in-person" appointments.  Finally, I informed him that there would be a charge for the virtual visit and that he could be  personally, fully or partially, financially responsible for it. [name] understanding and agreed to proceed.   Historic Elements   [name] is a 71y.o. year old, male patient evaluated today after our last contact on 11/05/2021. Rick Carter a past medical history of Anxiety, Bronchitis, Cancer ([place], COPD (chronic obstructive pulmonary disease) ([place], Depression, Hernia of abdominal wall, History of alcoholism ([place] (08/08/2015), History of pneumonectomy (08/08/2015), Hypertension, Macular degeneration (08/26/2016), and Tumor cells. He also  Carter a past surgical history that includes Tonsillectomy; Lung removal, partial; and Neck surgery. Mr. CMceachernhas a current medication list which includes the following prescription(s): acetaminophen, acetylcysteine, tudorza pressair, advair diskus, albuterol, aspirin ec, bacitracin, cholecalciferol, cholecalciferol, fluticasone-salmeterol, trelegy ellipta, ipratropium-albuterol, lorazepam, losartan-hydrochlorothiazide, polyethylene glycol, senna, ventolin hfa, albuterol, escitalopram, fluticasone-salmeterol, linzess, pantoprazole, pregabalin, and tizanidine. He  reports that he quit smoking about 2 years ago. His smoking use included cigarettes. He Carter never used smokeless tobacco. He reports that he does not drink alcohol. No history on file for drug use. Mr. CAbdullahis allergic to atorvastatin and varenicline.   HPI  Today, he is being contacted for follow-up evaluation.  The patient indicates being interested in getting some "shots" in his lower back.  He refers having some low back pain.  He also asked about whether or not I would be sending a prescription to their pharmacy and I informed him that the last time I prescribed something for him was on 10/10/2021.  Since then his pain medications have been written by his PCP who Carter been taken  over, probably due to convenience to the patient.  Pharmacotherapy  Assessment   Opioid Analgesic: Oxycodone ER 20 mg, 1 tab PO BID (40 mg/day of oxycodone) + Oxycodone IR 5 mg, 1 tab PO q 6 hrs PRN for breakthrough pain (20 mg/day of oxycodone) (total: 60 mg/day of oxycodone) MME/day: 90 MME/day.   Monitoring: [place] PMP: PDMP reviewed during this encounter.       Pharmacotherapy: No side-effects or adverse reactions reported. Compliance: No problems identified. Effectiveness: Clinically acceptable. Plan: Refer to "POC". UDS:  Summary  Date Value Ref Range Status  05/29/2021 Note  Final    Comment:    ==================================================================== [employer] Select 13 (MW) ==================================================================== Test                             Result       Flag       Units  Drug Present and Declared for Prescription Verification   Lorazepam                      1823         EXPECTED   ng/mg creat    Source of lorazepam is a scheduled prescription medication.    Oxycodone                      2503         EXPECTED   ng/mg creat   Oxymorphone                    639          EXPECTED   ng/mg creat   Noroxycodone                   >14085       EXPECTED   ng/mg creat   Noroxymorphone                 642          EXPECTED   ng/mg creat    Sources of oxycodone are scheduled prescription medications.    Oxymorphone, noroxycodone, and noroxymorphone are expected    metabolites of oxycodone. Oxymorphone is also available as a    scheduled prescription medication.  ==================================================================== Test                      Result    Flag   Units      Ref Range   Creatinine              71               mg/dL      >=20 ==================================================================== Declared Medications:  The flagging and interpretation on this report are based on the  following declared medications.   Unexpected results may arise from  inaccuracies in the declared medications.   **Note: The testing scope of this panel includes these medications:   Lorazepam (Ativan)  Oxycodone   **Note: The testing scope of this panel does not include the  following reported medications:   Acetaminophen (Tylenol)  Acetylcysteine  Aclidinium  Albuterol  Albuterol (Duoneb)  Aspirin  Escitalopram (Lexapro)  Fluoxetine (Prozac)  Fluticasone (Advair)  Fluticasone (Trelegy)  Furosemide (Lasix)  Guaifenesin (Mucinex)  Hydrochlorothiazide (Microzide)  Hydrochlorothiazide (Hyzaar)  Hydroxyzine (Atarax)  Ipratropium (Duoneb)  Loratadine (Claritin)  Losartan (Hyzaar)  Lubiprostone (Amitiza)  Multivitamin  Pantoprazole (Protonix)  Pregabalin (Lyrica)  Salmeterol (Advair)  Tizanidine (Zanaflex)  Trazodone (  Desyrel)  Umeclidinium (Trelegy)  Umeclidinium  Vilanterol (Trelegy)  Vitamin D ==================================================================== For clinical consultation, please call [phone]. ====================================================================      Laboratory Chemistry Profile   Renal Lab Results  Component Value Date   BUN 9 04/06/2018   CREATININE 0.84 04/06/2018   BCR 11 04/06/2018   GFRAA 104 04/06/2018   GFRNONAA 90 04/06/2018    Hepatic Lab Results  Component Value Date   AST 18 04/06/2018   ALT 24 01/06/2017   ALBUMIN 4.6 04/06/2018   ALKPHOS 64 04/06/2018    Electrolytes Lab Results  Component Value Date   NA 144 04/06/2018   K 4.2 04/06/2018   CL 102 04/06/2018   CALCIUM 9.8 04/06/2018   MG 2.1 04/06/2018    Bone Lab Results  Component Value Date   25OHVITD1 76 04/06/2018   25OHVITD2 <1.0 04/06/2018   25OHVITD3 76 04/06/2018    Inflammation (CRP: Acute Phase) (ESR: Chronic Phase) Lab Results  Component Value Date   CRP 2 04/06/2018   ESRSEDRATE 4 04/06/2018         Note: Above Lab results reviewed.  Imaging  DG  PAIN CLINIC C-ARM 1-60 MIN NO REPORT Fluoro was used, but no Radiologist interpretation will be provided.  Please refer to "NOTES" tab for provider progress note.  Assessment  The primary encounter diagnosis was Chronic pain syndrome. 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  Plan of Care  Problem-specific:  No problem-specific Assessment & Plan notes found for this encounter.  Mr. ONOFRE GAINS Sr. Carter a current medication list which includes the following long-term medication(s): tudorza pressair, advair diskus, albuterol, fluticasone-salmeterol, ipratropium-albuterol, losartan-hydrochlorothiazide, ventolin hfa, albuterol, fluticasone-salmeterol, linzess, pregabalin, and tizanidine.  Pharmacotherapy (Medications Ordered): No orders of the defined types were placed in this encounter.  Orders:  Orders Placed This Encounter  Procedures   Lumbar Epidural Injection    Standing Status:   Future    Standing Expiration Date:   03/19/2022    Scheduling Instructions:     Procedure: Interlaminar Lumbar Epidural Steroid injection (LESI)  L4-5     Laterality: Right-sided     Sedation: Patient's choice.     Timeframe: ASAA    Order Specific Question:   Where will this procedure be performed?    Answer:   [facility]   Follow-up plan:   Return for (Clinic) procedure: (R) L4-5 LESI.     Interventional Therapies  Risk   Complexity Considerations:   Estimated body mass index is 20.38 kg/m as calculated from the following:   Height as of 07/03/21: 5' 9" (1.753 m).   Weight  as of 07/03/21: 138 lb (62.6 kg). WNL   Planned   Pending:   Therapeutic right L4-5 LESI    Under consideration:   Therapeutic right L4-5 LESI    Completed:   Palliative bilateral lumbar facet MBB x1 (07/23/2015)  Palliative left cervical ESI x6 (10/19/2019)  Palliative right T8-9 thoracic ESI x3 (06/23/2018)  Palliative ML T6-7 thoracic ESI x2 (08/26/2016)  Palliative right L5-S1 LESI x1 (02/20/2016)  Palliative right L4-5 LESI x4 (07/03/2021)  Palliative left hand, ring finger (No.:4)(A-4 pulley), trigger finger  injection x4 (08/31/2019)  Palliative ML L2-3 interspinous ligament injection x1    Therapeutic   Palliative (PRN) options:   Palliative left cervical ESI   Palliative right T8-9 thoracic ESI   Palliative right L4-5 LESI   Palliative left hand, ring finger (No.:4)(A-4 pulley), trigger finger injection      Recent Visits Date Type Provider Dept  09/26/21 Office Visit Milinda Pointer, MD [facility]  Showing recent visits within past 90 days and meeting all other requirements Today's Visits Date Type Provider Dept  12/17/21 Office Visit Milinda Pointer, MD [facility]  Showing today's visits and meeting all other requirements Future Appointments No visits were found meeting these conditions. Showing future appointments within next 90 days and meeting all other requirements  I discussed the assessment and treatment plan with the patient. The patient was provided an opportunity to ask questions and all were answered. The patient agreed with the plan and demonstrated an understanding of the instructions.  Patient advised to call back or seek an in-person evaluation if the symptoms or condition worsens.  Duration of encounter: 15 minutes.  Note by: Gaspar Cola, MD Date: 12/17/2021; Time: 1:06 PM

## 2021-12-17 ENCOUNTER — Other Ambulatory Visit: Payer: Self-pay

## 2021-12-17 ENCOUNTER — Ambulatory Visit: Payer: Medicare Other | Attending: Pain Medicine | Admitting: Pain Medicine

## 2021-12-17 DIAGNOSIS — Z79891 Long term (current) use of opiate analgesic: Secondary | ICD-10-CM

## 2021-12-17 DIAGNOSIS — M48061 Spinal stenosis, lumbar region without neurogenic claudication: Secondary | ICD-10-CM

## 2021-12-17 DIAGNOSIS — M5441 Lumbago with sciatica, right side: Secondary | ICD-10-CM

## 2021-12-17 DIAGNOSIS — M961 Postlaminectomy syndrome, not elsewhere classified: Secondary | ICD-10-CM

## 2021-12-17 DIAGNOSIS — M5126 Other intervertebral disc displacement, lumbar region: Secondary | ICD-10-CM

## 2021-12-17 DIAGNOSIS — Z789 Other specified health status: Secondary | ICD-10-CM

## 2021-12-17 DIAGNOSIS — F1990 Other psychoactive substance use, unspecified, uncomplicated: Secondary | ICD-10-CM

## 2021-12-17 DIAGNOSIS — M79604 Pain in right leg: Secondary | ICD-10-CM

## 2021-12-17 DIAGNOSIS — Z79899 Other long term (current) drug therapy: Secondary | ICD-10-CM

## 2021-12-17 DIAGNOSIS — G894 Chronic pain syndrome: Secondary | ICD-10-CM | POA: Diagnosis not present

## 2021-12-17 DIAGNOSIS — R64 Cachexia: Secondary | ICD-10-CM

## 2021-12-17 DIAGNOSIS — M5416 Radiculopathy, lumbar region: Secondary | ICD-10-CM

## 2021-12-17 DIAGNOSIS — M542 Cervicalgia: Secondary | ICD-10-CM

## 2021-12-17 DIAGNOSIS — F1021 Alcohol dependence, in remission: Secondary | ICD-10-CM

## 2021-12-17 DIAGNOSIS — G893 Neoplasm related pain (acute) (chronic): Secondary | ICD-10-CM

## 2021-12-17 DIAGNOSIS — M5412 Radiculopathy, cervical region: Secondary | ICD-10-CM

## 2021-12-17 DIAGNOSIS — M5136 Other intervertebral disc degeneration, lumbar region: Secondary | ICD-10-CM

## 2021-12-17 DIAGNOSIS — G8929 Other chronic pain: Secondary | ICD-10-CM

## 2021-12-17 DIAGNOSIS — M7918 Myalgia, other site: Secondary | ICD-10-CM

## 2021-12-17 NOTE — Patient Instructions (Signed)
______________________________________________________________________  Preparing for your procedure (without sedation)  Procedure appointments are limited to planned procedures: No Prescription Refills. No disability issues will be discussed. No medication changes will be discussed.  Instructions: Oral Intake: Do not eat or drink anything for at least 6 hours prior to your procedure. (Exception: Blood Pressure Medication. See below.) Transportation: Unless otherwise stated by your physician, you may drive yourself after the procedure. Blood Pressure Medicine: Do not forget to take your blood pressure medicine with a sip of water the morning of the procedure. If your Diastolic (lower reading)is above 100 mmHg, elective cases will be cancelled/rescheduled. Blood thinners: These will need to be stopped for procedures. Notify our staff if you are taking any blood thinners. Depending on which one you take, there will be specific instructions on how and when to stop it. Diabetics on insulin: Notify the staff so that you can be scheduled 1st case in the morning. If your diabetes requires high dose insulin, take only  of your normal insulin dose the morning of the procedure and notify the staff that you have done so. Preventing infections: Shower with an antibacterial soap the morning of your procedure.  Build-up your immune system: Take 1000 mg of Vitamin C with every meal (3 times a day) the day prior to your procedure. Antibiotics: Inform the staff if you have a condition or reason that requires you to take antibiotics before dental procedures. Pregnancy: If you are pregnant, call and cancel the procedure. Sickness: If you have a cold, fever, or any active infections, call and cancel the procedure. Arrival: You must be in the facility at least 30 minutes prior to your scheduled procedure. Children: Do not bring any children with you. Dress appropriately: Bring dark clothing that you would not mind  if they get stained. Valuables: Do not bring any jewelry or valuables.  Reasons to call and reschedule or cancel your procedure: (Following these recommendations will minimize the risk of a serious complication.) Surgeries: Avoid having procedures within 2 weeks of any surgery. (Avoid for 2 weeks before or after any surgery). Flu Shots: Avoid having procedures within 2 weeks of a flu shots or . (Avoid for 2 weeks before or after immunizations). Barium: Avoid having a procedure within 7-10 days after having had a radiological study involving the use of radiological contrast. (Myelograms, Barium swallow or enema study). Heart attacks: Avoid any elective procedures or surgeries for the initial 6 months after a "Myocardial Infarction" (Heart Attack). Blood thinners: It is imperative that you stop these medications before procedures. Let us know if you if you take any blood thinner.  Infection: Avoid procedures during or within two weeks of an infection (including chest colds or gastrointestinal problems). Symptoms associated with infections include: Localized redness, fever, chills, night sweats or profuse sweating, burning sensation when voiding, cough, congestion, stuffiness, runny nose, sore throat, diarrhea, nausea, vomiting, cold or Flu symptoms, recent or current infections. It is specially important if the infection is over the area that we intend to treat. Heart and lung problems: Symptoms that may suggest an active cardiopulmonary problem include: cough, chest pain, breathing difficulties or shortness of breath, dizziness, ankle swelling, uncontrolled high or unusually low blood pressure, and/or palpitations. If you are experiencing any of these symptoms, cancel your procedure and contact your primary care physician for an evaluation.  Remember:  Regular Business hours are:  Monday to Thursday 8:00 AM to 4:00 PM  Provider's Schedule: Milinda Pointer, MD:  Procedure days: Tuesday and Thursday  7:30 AM to 4:00 PM  Gillis Santa, MD:  Procedure days: Monday and Wednesday 7:30 AM to 4:00 PM ______________________________________________________________________  ____________________________________________________________________________________________  General Risks and Possible Complications  Patient Responsibilities: It is important that you read this as it is part of your informed consent. It is our duty to inform you of the risks and possible complications associated with treatments offered to you. It is your responsibility as a patient to read this and to ask questions about anything that is not clear or that you believe was not covered in this document.  Patients Rights: You have the right to refuse treatment. You also have the right to change your mind, even after initially having agreed to have the treatment done. However, under this last option, if you wait until the last second to change your mind, you may be charged for the materials used up to that point.  Introduction: Medicine is not an Chief Strategy Officer. Everything in Medicine, including the lack of treatment(s), carries the potential for danger, harm, or loss (which is by definition: Risk). In Medicine, a complication is a secondary problem, condition, or disease that can aggravate an already existing one. All treatments carry the risk of possible complications. The fact that a side effects or complications occurs, does not imply that the treatment was conducted incorrectly. It must be clearly understood that these can happen even when everything is done following the highest safety standards.  No treatment: You can choose not to proceed with the proposed treatment alternative. The PRO(s) would include: avoiding the risk of complications associated with the therapy. The CON(s) would include: not getting any of the treatment benefits. These benefits fall under one of three categories: diagnostic; therapeutic; and/or palliative.  Diagnostic benefits include: getting information which can ultimately lead to improvement of the disease or symptom(s). Therapeutic benefits are those associated with the successful treatment of the disease. Finally, palliative benefits are those related to the decrease of the primary symptoms, without necessarily curing the condition (example: decreasing the pain from a flare-up of a chronic condition, such as incurable terminal cancer).  General Risks and Complications: These are associated to most interventional treatments. They can occur alone, or in combination. They fall under one of the following six (6) categories: no benefit or worsening of symptoms; bleeding; infection; nerve damage; allergic reactions; and/or death. No benefits or worsening of symptoms: In Medicine there are no guarantees, only probabilities. No healthcare provider can ever guarantee that a medical treatment will work, they can only state the probability that it may. Furthermore, there is always the possibility that the condition may worsen, either directly, or indirectly, as a consequence of the treatment. Bleeding: This is more common if the patient is taking a blood thinner, either prescription or over the counter (example: Goody Powders, Fish oil, Aspirin, Garlic, etc.), or if suffering a condition associated with impaired coagulation (example: Hemophilia, cirrhosis of the liver, low platelet counts, etc.). However, even if you do not have one on these, it can still happen. If you have any of these conditions, or take one of these drugs, make sure to notify your treating physician. Infection: This is more common in patients with a compromised immune system, either due to disease (example: diabetes, cancer, human immunodeficiency virus [HIV], etc.), or due to medications or treatments (example: therapies used to treat cancer and rheumatological diseases). However, even if you do not have one on these, it can still happen. If you  have any of these conditions, or take  one of these drugs, make sure to notify your treating physician. Nerve Damage: This is more common when the treatment is an invasive one, but it can also happen with the use of medications, such as those used in the treatment of cancer. The damage can occur to small secondary nerves, or to large primary ones, such as those in the spinal cord and brain. This damage may be temporary or permanent and it may lead to impairments that can range from temporary numbness to permanent paralysis and/or brain death. Allergic Reactions: Any time a substance or material comes in contact with our body, there is the possibility of an allergic reaction. These can range from a mild skin rash (contact dermatitis) to a severe systemic reaction (anaphylactic reaction), which can result in death. Death: In general, any medical intervention can result in death, most of the time due to an unforeseen complication. ____________________________________________________________________________________________

## 2022-01-23 DIAGNOSIS — Z9181 History of falling: Secondary | ICD-10-CM | POA: Insufficient documentation

## 2022-01-23 DIAGNOSIS — S71121D Laceration with foreign body, right thigh, subsequent encounter: Secondary | ICD-10-CM | POA: Insufficient documentation

## 2022-01-27 DIAGNOSIS — E639 Nutritional deficiency, unspecified: Secondary | ICD-10-CM | POA: Insufficient documentation

## 2022-01-27 DIAGNOSIS — E46 Unspecified protein-calorie malnutrition: Secondary | ICD-10-CM | POA: Insufficient documentation

## 2022-01-27 DIAGNOSIS — R54 Age-related physical debility: Secondary | ICD-10-CM | POA: Insufficient documentation

## 2022-02-10 NOTE — Progress Notes (Addendum)
PROVIDER NOTE: Interpretation of information contained herein should be left to medically-trained personnel. Specific patient instructions are provided elsewhere under "Patient Instructions" section of medical record. This document was created in part using STT-dictation technology, any transcriptional errors that may result from this process are unintentional.  ?Patient: Rick GOETZKE Sr. ?Type: Established ?DOB: [birth year] ?MRN: [id number] ?PCP: Rupert Stacks, MD  Service: Procedure ?DOS: 02/12/2022 ?Setting: Ambulatory ?Location: Ambulatory outpatient facility ?Delivery: Face-to-face Provider: Gaspar Cola, MD ?Specialty: Interventional Pain Management ?Specialty designation: 09 ?Location: Outpatient facility ?Ref. Prov.: Rupert Stacks, MD   ? ?Primary Reason for Visit: Interventional Pain Management Treatment. ?CC: Back Pain (Mid and low) ?  ?Procedure:          ? Type: Lumbar epidural steroid injection (LESI) (interlaminar) #5    ?Laterality: Right   ?Level:  L4-5 Level.  ?Imaging: Fluoroscopic guidance ?Anesthesia: Local anesthesia (1-2% Lidocaine) ?Anxiolysis: None                 ?Sedation: None. ?DOS: 02/12/2022  ?Performed by: Gaspar Cola, MD ? ?Purpose: Diagnostic/Therapeutic ?Indications: Lumbar radicular pain of intraspinal etiology of more than 4 weeks that has failed to respond to conservative therapy and is severe enough to impact quality of life or function. ?1. Chronic low back pain (1ry area of Pain) (midline) w/ sciatica (Right)   ?2. Chronic lower extremity pain (2ry area of Pain) (Right)   ?3. Chronic lumbar radicular pain (L5/S1 dermatome) (Right)   ?4. DDD (degenerative disc disease), lumbar   ?5. Lumbar foraminal stenosis (L5-S1) (Right)   ?6. 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 Cancer-related pain   ? ?NAS-11 Pain score:  ? Pre-procedure: 7 /10  ? Post-procedure: 4 /10  ? ?  ? ?Position / Prep / Materials:  ?Position: Prone w/ head of the table raised (slight reverse  trendelenburg) to facilitate breathing.  ?Prep solution: DuraPrep (Iodine Povacrylex [0.7% available iodine] and Isopropyl Alcohol, 74% w/w) ?Prep Area: Entire Posterior Lumbar Region from lower scapular tip down to mid buttocks area and from flank to flank. ?Materials:  ?Tray: Epidural tray ?Needle(s):  ?Type: Epidural needle          ?Gauge (G):  17 ?Length: Regular (3.5-in) ?Qty: 1 ? ?Pre-op H&P Assessment:  ?Rick Carter is a 71 y.o. (year old), male patient, seen today for interventional treatment. He  has a past surgical history that includes Tonsillectomy; Lung removal, partial; and Neck surgery. [name] has a current medication list which includes the following prescription(s): acetaminophen, acetylcysteine, tudorza pressair, advair diskus, albuterol, albuterol, vitamin c, aspirin ec, bacitracin, calcium carbonate, cholecalciferol, cholecalciferol, escitalopram, fluticasone-salmeterol, fluticasone-salmeterol, trelegy ellipta, ipratropium-albuterol, ketoconazole, linzess, lorazepam, losartan-hydrochlorothiazide, multiple vitamins-minerals, oxycodone, oxycodone, pantoprazole, polyethylene glycol, pregabalin, pseudoephedrine-guaifenesin, senna, tizanidine, ventolin hfa, and zinc gluconate, and the following Facility-Administered Medications: pentafluoroprop-tetrafluoroeth and sodium chloride flush. His primarily concern today is the Back Pain (Mid and low) ? ?Initial Vital Signs:  ?Pulse/HCG Rate: 80ECG Heart Rate: 80 ?Temp: 98.1 ?F (36.7 ?C) ?Resp: 18 ?BP: (!) 179/82 ?SpO2: 100 % (4L) ? ?BMI: Estimated body mass index is 17.43 kg/m? as calculated from the following: ?  Height as of this encounter: _0  (1.753 m). ?  Weight as of this encounter: 118 lb (53.5 kg). ? ?Risk Assessment: ?Allergies: Reviewed. He is allergic to atorvastatin and varenicline.  ?Allergy Precautions: None required ?Coagulopathies: Reviewed. None identified.  ?Blood-thinner therapy: None at this time ?Active Infection(s): Reviewed. None  identified. [name] is afebrile ? ?Site Confirmation: [name]  was asked to confirm the procedure and laterality before marking the site ?Procedure checklist: Completed ?Consent: Before the procedure and under the influence of no sedative(s), amnesic(s), or anxiolytics, the patient was informed of the treatment options, risks and possible complications. To fulfill our ethical and legal obligations, as recommended by the American Medical Association's Code of Ethics, I have informed the patient of my clinical impression; the nature and purpose of the treatment or procedure; the risks, benefits, and possible complications of the intervention; the alternatives, including doing nothing; the risk(s) and benefit(s) of the alternative treatment(s) or procedure(s); and the risk(s) and benefit(s) of doing nothing. ?The patient was provided information about the general risks and possible complications associated with the procedure. These may include, but are not limited to: failure to achieve desired goals, infection, bleeding, organ or nerve damage, allergic reactions, paralysis, and death. ?In addition, the patient was informed of those risks and complications associated to Spine-related procedures, such as failure to decrease pain; infection (i.e.: Meningitis, epidural or intraspinal abscess); bleeding (i.e.: epidural hematoma, subarachnoid hemorrhage, or any other type of intraspinal or peri-dural bleeding); organ or nerve damage (i.e.: Any type of peripheral nerve, nerve root, or spinal cord injury) with subsequent damage to sensory, motor, and/or autonomic systems, resulting in permanent pain, numbness, and/or weakness of one or several areas of the body; allergic reactions; (i.e.: anaphylactic reaction); and/or death. ?Furthermore, the patient was informed of those risks and complications associated with the medications. These include, but are not limited to: allergic reactions (i.e.: anaphylactic or  anaphylactoid reaction(s)); adrenal axis suppression; blood sugar elevation that in diabetics may result in ketoacidosis or comma; water retention that in patients with history of congestive heart failure may result in shortness of breath, pulmonary edema, and decompensation with resultant heart failure; weight gain; swelling or edema; medication-induced neural toxicity; particulate matter embolism and blood vessel occlusion with resultant organ, and/or nervous system infarction; and/or aseptic necrosis of one or more joints. ?Finally, the patient was informed that Medicine is not an exact science; therefore, there is also the possibility of unforeseen or unpredictable risks and/or possible complications that may result in a catastrophic outcome. The patient indicated having understood very clearly. We have given the patient no guarantees and we have made no promises. Enough time was given to the patient to ask questions, all of which were answered to the patient's satisfaction. Mr. Schake has indicated that he wanted to continue with the procedure. ?Attestation: I, the ordering provider, attest that I have discussed with the patient the benefits, risks, side-effects, alternatives, likelihood of achieving goals, and potential problems during recovery for the procedure that I have provided informed consent. ?Date  Time: 02/12/2022 11:48 AM ? ?Pre-Procedure Preparation:  ?Monitoring: As per clinic protocol. Respiration, ETCO2, SpO2, BP, heart rate and rhythm monitor placed and checked for adequate function ?Safety Precautions: Patient was assessed for positional comfort and pressure points before starting the procedure. ?Time-out: I initiated and conducted the "Time-out" before starting the procedure, as per protocol. The patient was asked to participate by confirming the accuracy of the "Time Out" information. Verification of the correct person, site, and procedure were performed and confirmed by me, the nursing  staff, and the patient. "Time-out" conducted as per Joint Commission's Universal Protocol (UP.01.01.01). ?Time: 1203 ? ?Description/Narrative of Procedure:          ?Target: Epidural space via interlaminar opening, initiall

## 2022-02-12 ENCOUNTER — Encounter: Payer: Self-pay | Admitting: Pain Medicine

## 2022-02-12 ENCOUNTER — Ambulatory Visit (HOSPITAL_BASED_OUTPATIENT_CLINIC_OR_DEPARTMENT_OTHER): Payer: Medicare Other | Admitting: Pain Medicine

## 2022-02-12 ENCOUNTER — Other Ambulatory Visit: Payer: Self-pay

## 2022-02-12 ENCOUNTER — Ambulatory Visit
Admission: RE | Admit: 2022-02-12 | Discharge: 2022-02-12 | Disposition: A | Discharge status: Home / Self Care | Payer: Medicare Other | Source: Ambulatory Visit | Attending: Pain Medicine | Admitting: Pain Medicine

## 2022-02-12 VITALS — BP 195/95 | HR 84 | Temp 98.1°F | Resp 17 | Ht 69.0 in | Wt 118.0 lb

## 2022-02-12 DIAGNOSIS — G8929 Other chronic pain: Secondary | ICD-10-CM

## 2022-02-12 DIAGNOSIS — M48061 Spinal stenosis, lumbar region without neurogenic claudication: Secondary | ICD-10-CM

## 2022-02-12 DIAGNOSIS — Z79899 Other long term (current) drug therapy: Secondary | ICD-10-CM | POA: Diagnosis not present

## 2022-02-12 DIAGNOSIS — Z902 Acquired absence of lung [part of]: Secondary | ICD-10-CM | POA: Diagnosis not present

## 2022-02-12 DIAGNOSIS — M5441 Lumbago with sciatica, right side: Secondary | ICD-10-CM | POA: Diagnosis present

## 2022-02-12 DIAGNOSIS — M5136 Other intervertebral disc degeneration, lumbar region: Secondary | ICD-10-CM | POA: Insufficient documentation

## 2022-02-12 DIAGNOSIS — Z515 Encounter for palliative care: Secondary | ICD-10-CM

## 2022-02-12 DIAGNOSIS — M79604 Pain in right leg: Secondary | ICD-10-CM | POA: Diagnosis not present

## 2022-02-12 DIAGNOSIS — M51369 Other intervertebral disc degeneration, lumbar region without mention of lumbar back pain or lower extremity pain: Secondary | ICD-10-CM

## 2022-02-12 DIAGNOSIS — G893 Neoplasm related pain (acute) (chronic): Secondary | ICD-10-CM

## 2022-02-12 DIAGNOSIS — M5126 Other intervertebral disc displacement, lumbar region: Secondary | ICD-10-CM | POA: Diagnosis not present

## 2022-02-12 DIAGNOSIS — M5416 Radiculopathy, lumbar region: Secondary | ICD-10-CM | POA: Diagnosis not present

## 2022-02-12 MED ORDER — IOHEXOL 180 MG/ML  SOLN
INTRAMUSCULAR | Status: AC
  Filled 2022-02-12: qty 20

## 2022-02-12 MED ORDER — LIDOCAINE HCL 2 % IJ SOLN
INTRAMUSCULAR | Status: AC
  Filled 2022-02-12: qty 20

## 2022-02-12 MED ORDER — MIDAZOLAM HCL 5 MG/5ML IJ SOLN
0.5000 mg | Freq: Once | INTRAMUSCULAR | Status: AC
  Administered 2022-02-12: 2 mg via INTRAVENOUS

## 2022-02-12 MED ORDER — PENTAFLUOROPROP-TETRAFLUOROETH EX AERO: INHALATION_SPRAY | Freq: Once | CUTANEOUS | Status: DC

## 2022-02-12 MED ORDER — ROPIVACAINE HCL 2 MG/ML IJ SOLN
INTRAMUSCULAR | Status: AC
  Filled 2022-02-12: qty 20

## 2022-02-12 MED ORDER — MIDAZOLAM HCL 5 MG/5ML IJ SOLN
INTRAMUSCULAR | Status: AC
  Filled 2022-02-12: qty 5

## 2022-02-12 MED ORDER — SODIUM CHLORIDE 0.9% FLUSH: 2.0000 mL | Freq: Once | INTRAVENOUS | Status: DC

## 2022-02-12 MED ORDER — IOHEXOL 180 MG/ML  SOLN
10.0000 mL | Freq: Once | INTRAMUSCULAR | Status: AC
  Administered 2022-02-12: 10 mL via EPIDURAL

## 2022-02-12 MED ORDER — LIDOCAINE HCL 2 % IJ SOLN
20.0000 mL | Freq: Once | INTRAMUSCULAR | Status: AC
  Administered 2022-02-12: 400 mg

## 2022-02-12 MED ORDER — ROPIVACAINE HCL 2 MG/ML IJ SOLN
2.0000 mL | Freq: Once | INTRAMUSCULAR | Status: AC
  Administered 2022-02-12: 2 mL via EPIDURAL

## 2022-02-12 MED ORDER — LACTATED RINGERS IV SOLN
1000.0000 mL | Freq: Once | INTRAVENOUS | Status: AC
  Administered 2022-02-12: 1000 mL via INTRAVENOUS

## 2022-02-12 MED ORDER — TRIAMCINOLONE ACETONIDE 40 MG/ML IJ SUSP
INTRAMUSCULAR | Status: AC
  Filled 2022-02-12: qty 1

## 2022-02-12 MED ORDER — TRIAMCINOLONE ACETONIDE 40 MG/ML IJ SUSP
40.0000 mg | Freq: Once | INTRAMUSCULAR | Status: AC
  Administered 2022-02-12: 40 mg

## 2022-02-12 NOTE — Patient Instructions (Signed)

## 2022-02-12 NOTE — Progress Notes (Addendum)
Safety precautions to be maintained throughout the outpatient stay will include: orient to surroundings, keep bed in low position, maintain call bell within reach at all times, provide assistance with transfer out of bed and ambulation.  ? ?1150 Dr Dossie Arbour in to mark patient and to assess abrasion on mid back lower, midline. ?

## 2022-02-13 ENCOUNTER — Telehealth: Payer: Self-pay | Admitting: *Deleted

## 2022-02-13 NOTE — Telephone Encounter (Signed)
No problems post procedure. 

## 2022-02-17 NOTE — Progress Notes (Signed)
Patient: Rick Patrick Sr.  Service Category: E/M  Provider: Gaspar Cola, Carter  ?DOB: 1951-01-18  DOS: 02/21/2022  Location: Office  ?MRN: [id number]  Setting: Ambulatory outpatient  Referring Provider: Rupert Stacks, Carter  ?Type: Established Patient  Specialty: Interventional Pain Management  PCP: Rick Carter  ?Location: Remote location  Delivery: TeleHealth    ? ?Virtual Carter - Pain Management ?PROVIDER NOTE: Information contained herein reflects review and annotations entered in association with Carter. Interpretation of such information and data should be left to medically-trained personnel. Information provided to patient can be located elsewhere in the medical record under "Patient Instructions". Document created using STT-dictation technology, any transcriptional errors that may result from process are unintentional.  ?  ?Contact & Pharmacy ?Preferred: [phone] ?Home: [phone] (home) ?Mobile: [phone] (mobile) ?E-mail: No e-mail address on record  ?[id number] [id number]?Phone: [phone]: [phone]?  ?Pre-screening  ?Rick Carter. He indicated preferring virtual for this Carter.  ? ?Reason ?COVID-19*  Social distancing based on CDC and AMA recommendations.  ? ?I contacted Rick LIUZZISr. on 02/21/2022 via telephone.      I clearly identified myself as Rick Carter. I verified that I was speaking with the correct person using two identifiers (Name: Rick MUZZYSr., and date of birth: [birth year]. ? ?Consent ?I sought verbal advanced consent from JWoodbinefor virtual visit interactions. I informed Mr. CFlemisterof possible security and privacy concerns, risks, and limitations associated with providing "not-in-person" medical evaluation and management services. I also informed [name] the availability of "in-person"  appointments. Finally, I informed him that there would be a charge for the virtual visit and that he could be  personally, fully or partially, financially responsible for it. [name] understanding and agreed to proceed.  ? ?Historic Elements   ?[name] is a 71y.o. year old, male patient evaluated today after our last contact on 02/12/2022. 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 He also  has a past surgical history that includes Tonsillectomy; Lung removal, partial; and Neck surgery. Mr. CHoardhas a current medication list which includes the following prescription(s): acetaminophen, acetylcysteine, tudorza pressair, advair diskus, albuterol, vitamin c, aspirin ec, bacitracin, calcium carbonate, cholecalciferol, cholecalciferol, fluticasone-salmeterol, trelegy ellipta, ipratropium-albuterol, ketoconazole, linzess, lorazepam, losartan-hydrochlorothiazide, multiple vitamins-minerals, oxycodone, oxycodone, polyethylene glycol, pregabalin, pseudoephedrine-guaifenesin, ventolin hfa, zinc gluconate, albuterol, fluticasone-salmeterol, and tizanidine. He  reports that he quit smoking about 3 years ago. His smoking use included cigarettes. He has never used smokeless tobacco. He reports that he does not drink alcohol. No history on file for drug use. [name]  ? ?HPI  ?Today, he is being contacted for a post-procedure assessment. ? ?Post-procedure evaluation  ? Type: Lumbar epidural steroid injection (LESI) (interlaminar) #5    ?Laterality: Right   ?Level:  L4-5 Level.  ?Imaging: Fluoroscopic guidance ?Anesthesia: Local anesthesia (1-2% Lidocaine) ?Anxiolysis: None                  ?Sedation: None. ?DOS: 02/12/2022  ?Performed by: Rick Carter ? ?Purpose: Diagnostic/Therapeutic ?Indications: Lumbar radicular pain of intraspinal etiology  of more than 4 weeks that has failed to respond to conservative therapy and is severe enough to impact quality of life or function. ?1. Chronic low back pain (1ry area of Pain) (midline) w/ sciatica (Right)   ?2. Chronic lower extremity pain (2ry area of Pain) (Right)   ?3. Chronic lumbar radicular pain (L5/S1 dermatome) (Right)   ?4. DDD (degenerative disc disease), lumbar   ?5. Lumbar foraminal stenosis (L5-S1) (Right)   ?6. 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 Cancer-related pain   ? ?NAS-11 Pain score:  ? Pre-procedure: 7 /10  ? Post-procedure: 4 /10  ? ?  ?Effectiveness:  ?Initial hour after procedure: 100 %. ?Subsequent 4-6 hours post-procedure: 100 %. ?Analgesia past initial 6 hours: 70 %. ?Ongoing improvement:  ?Analgesic: The patient indicates having an ongoing 70% improvement in his low back pain.  He is very happy with the results and he indicates that at this time he does not need anything else. ?Function: Rick Carter reports improvement in function ?ROM: Rick Carter reports improvement in ROM ? ?Pharmacotherapy Assessment  ? ?Opioid Analgesic: Oxycodone ER 20 mg, 1 tab PO BID (40 mg/day of oxycodone) + Oxycodone IR 5 mg, 1 tab PO q 6 hrs PRN for breakthrough pain (20 mg/day of oxycodone) (total: 60 mg/day of oxycodone) ?MME/day: 46 MME/day.  ? ?Monitoring: ?Rick Carter PMP: PDMP reviewed during this Carter.       ?Pharmacotherapy: No side-effects or adverse reactions reported. ?Compliance: No problems identified. ?Effectiveness: Clinically acceptable. ?Plan: Refer to "POC". UDS:  ?Summary  ?Date Value Ref Range Status  ?05/29/2021 Note  Final  ?  Comment:  ?  ==================================================================== ?[employer] Select 13 (MW) ?==================================================================== ?Test                              Result       Flag       Units ? ?Drug Present and Declared for Prescription Verification ?  Lorazepam                      1823         EXPECTED   ng/mg creat ?   Source of lorazepam is a scheduled prescription medication. ? ?  Oxycodone                      2503         EXPECTED   ng/mg creat ?  Oxymorphone                    639          EXPECTED   ng/mg creat ?  Noroxycodone                   >14085       EXPECTED   ng/mg creat ?  Noroxymorphone                 642          EXPECTED   ng/mg creat ?   Sources of oxycodone are scheduled prescription medications. ?   Oxymorphone, noroxycodone, and noroxymorphone are expected ?   metabolites of oxycodone. Oxymorphone is also available as a ?   scheduled prescription medication. ? ?==================================================================== ?Test                      Result    Flag  Units      Ref Range ?  Creatinine              71               mg/dL      >=20 ?==================================================================== ?Declared Medications: ? The flagging and interpretation on this report are based on the ? following declared medications.  Unexpected results may arise from ? inaccuracies in the declared medications. ? ? **Note: The testing scope of this panel includes these medications: ? ? Lorazepam (Ativan) ? Oxycodone ? ? **Note: The testing scope of this panel does not include the ? following reported medications: ? ? Acetaminophen (Tylenol) ? Acetylcysteine ? Aclidinium ? Albuterol ? Albuterol (Duoneb) ? Aspirin ? Escitalopram (Lexapro) ? Fluoxetine (Prozac) ? Fluticasone (Advair) ? Fluticasone (Trelegy) ? Furosemide (Lasix) ? Guaifenesin (Mucinex) ? Hydrochlorothiazide (Microzide) ? Hydrochlorothiazide (Hyzaar) ? Hydroxyzine (Atarax) ? Ipratropium (Duoneb) ? Loratadine (Claritin) ? Losartan (Hyzaar) ? Lubiprostone (Amitiza) ? Multivitamin ? Pantoprazole (Protonix) ? Pregabalin (Lyrica) ? Salmeterol (Advair) ? Tizanidine  (Zanaflex) ? Trazodone (Desyrel) ? Umeclidinium (Trelegy) ? Umeclidinium ? Vilanterol (Trelegy) ? Vitamin D ?==================================================================== ?For clinical consultation, please ca

## 2022-02-21 ENCOUNTER — Ambulatory Visit: Payer: Medicare Other | Attending: Pain Medicine | Admitting: Pain Medicine

## 2022-02-21 DIAGNOSIS — G893 Neoplasm related pain (acute) (chronic): Secondary | ICD-10-CM

## 2022-02-21 DIAGNOSIS — M79604 Pain in right leg: Secondary | ICD-10-CM

## 2022-02-21 DIAGNOSIS — Z515 Encounter for palliative care: Secondary | ICD-10-CM

## 2022-02-21 DIAGNOSIS — M5441 Lumbago with sciatica, right side: Secondary | ICD-10-CM | POA: Diagnosis not present

## 2022-02-21 DIAGNOSIS — M5416 Radiculopathy, lumbar region: Secondary | ICD-10-CM | POA: Diagnosis not present

## 2022-02-21 DIAGNOSIS — G8929 Other chronic pain: Secondary | ICD-10-CM

## 2022-02-21 DIAGNOSIS — G894 Chronic pain syndrome: Secondary | ICD-10-CM

## 2022-03-12 ENCOUNTER — Telehealth: Payer: Self-pay | Admitting: Pain Medicine

## 2022-03-14 DEATH — deceased
